# Patient Record
Sex: Female | Born: 1940
Health system: Southern US, Community
[De-identification: ages and names within clinical notes are randomized; demographics above are authoritative.]

## PROBLEM LIST (undated history)

## (undated) DIAGNOSIS — F32A Depression, unspecified: Secondary | ICD-10-CM

## (undated) DIAGNOSIS — I10 Essential (primary) hypertension: Secondary | ICD-10-CM

## (undated) DIAGNOSIS — F419 Anxiety disorder, unspecified: Secondary | ICD-10-CM

## (undated) DIAGNOSIS — K219 Gastro-esophageal reflux disease without esophagitis: Secondary | ICD-10-CM

## (undated) DIAGNOSIS — S2239XA Fracture of one rib, unspecified side, initial encounter for closed fracture: Secondary | ICD-10-CM

## (undated) DIAGNOSIS — F329 Major depressive disorder, single episode, unspecified: Secondary | ICD-10-CM

## (undated) DIAGNOSIS — H3530 Unspecified macular degeneration: Secondary | ICD-10-CM

## (undated) DIAGNOSIS — C801 Malignant (primary) neoplasm, unspecified: Secondary | ICD-10-CM

## (undated) DIAGNOSIS — T8859XA Other complications of anesthesia, initial encounter: Secondary | ICD-10-CM

## (undated) DIAGNOSIS — T4145XA Adverse effect of unspecified anesthetic, initial encounter: Secondary | ICD-10-CM

## (undated) DIAGNOSIS — M810 Age-related osteoporosis without current pathological fracture: Secondary | ICD-10-CM

## (undated) DIAGNOSIS — M1990 Unspecified osteoarthritis, unspecified site: Secondary | ICD-10-CM

## (undated) HISTORY — PX: TONSILLECTOMY: SUR1361

## (undated) HISTORY — PX: CHOLECYSTECTOMY: SHX55

## (undated) HISTORY — PX: WRIST RECONSTRUCTION: SHX2675

## (undated) HISTORY — PX: ARM SKIN LESION BIOPSY / EXCISION: SUR471

## (undated) HISTORY — PX: TUBAL LIGATION: SHX77

---
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
## 2001-06-29 ENCOUNTER — Other Ambulatory Visit: Admission: RE | Admit: 2001-06-29 | Discharge: 2001-06-29 | Payer: Self-pay | Admitting: Internal Medicine

## 2004-04-20 ENCOUNTER — Ambulatory Visit: Payer: Self-pay | Admitting: Internal Medicine

## 2005-04-25 ENCOUNTER — Ambulatory Visit: Payer: Self-pay | Admitting: Internal Medicine

## 2005-08-24 ENCOUNTER — Ambulatory Visit: Payer: Self-pay | Admitting: Internal Medicine

## 2005-11-08 ENCOUNTER — Emergency Department: Payer: Self-pay | Admitting: Emergency Medicine

## 2005-12-14 ENCOUNTER — Encounter: Payer: Self-pay | Admitting: Orthopedic Surgery

## 2006-01-08 ENCOUNTER — Encounter: Payer: Self-pay | Admitting: Orthopedic Surgery

## 2006-08-09 ENCOUNTER — Ambulatory Visit: Payer: Self-pay | Admitting: Internal Medicine

## 2007-12-11 ENCOUNTER — Ambulatory Visit: Payer: Self-pay | Admitting: Obstetrics and Gynecology

## 2008-03-08 ENCOUNTER — Ambulatory Visit: Payer: Self-pay | Admitting: Internal Medicine

## 2008-09-29 ENCOUNTER — Ambulatory Visit: Payer: Self-pay | Admitting: Internal Medicine

## 2009-04-30 ENCOUNTER — Other Ambulatory Visit: Payer: Self-pay | Admitting: Family

## 2009-05-06 ENCOUNTER — Ambulatory Visit: Payer: Self-pay | Admitting: Internal Medicine

## 2009-05-21 ENCOUNTER — Ambulatory Visit: Payer: Self-pay | Admitting: Internal Medicine

## 2009-05-26 ENCOUNTER — Ambulatory Visit: Payer: Self-pay | Admitting: Gastroenterology

## 2009-07-07 ENCOUNTER — Ambulatory Visit: Payer: Self-pay | Admitting: Ophthalmology

## 2009-07-13 ENCOUNTER — Ambulatory Visit: Payer: Self-pay | Admitting: Ophthalmology

## 2009-08-25 ENCOUNTER — Ambulatory Visit: Payer: Self-pay | Admitting: Ophthalmology

## 2010-06-22 ENCOUNTER — Ambulatory Visit: Payer: Self-pay | Admitting: Internal Medicine

## 2011-07-14 ENCOUNTER — Ambulatory Visit: Payer: Self-pay | Admitting: Internal Medicine

## 2012-05-30 ENCOUNTER — Ambulatory Visit: Payer: Self-pay | Admitting: Gastroenterology

## 2012-05-31 LAB — PATHOLOGY REPORT

## 2012-07-18 ENCOUNTER — Ambulatory Visit: Payer: Self-pay | Admitting: Internal Medicine

## 2013-07-19 ENCOUNTER — Ambulatory Visit: Payer: Self-pay | Admitting: Internal Medicine

## 2014-07-16 DIAGNOSIS — M461 Sacroiliitis, not elsewhere classified: Secondary | ICD-10-CM | POA: Diagnosis not present

## 2014-07-16 DIAGNOSIS — M9903 Segmental and somatic dysfunction of lumbar region: Secondary | ICD-10-CM | POA: Diagnosis not present

## 2014-07-16 DIAGNOSIS — M797 Fibromyalgia: Secondary | ICD-10-CM | POA: Diagnosis not present

## 2014-07-16 DIAGNOSIS — M9905 Segmental and somatic dysfunction of pelvic region: Secondary | ICD-10-CM | POA: Diagnosis not present

## 2014-07-21 ENCOUNTER — Ambulatory Visit: Payer: Self-pay | Admitting: Internal Medicine

## 2014-07-21 DIAGNOSIS — Z1231 Encounter for screening mammogram for malignant neoplasm of breast: Secondary | ICD-10-CM | POA: Diagnosis not present

## 2014-07-28 DIAGNOSIS — M810 Age-related osteoporosis without current pathological fracture: Secondary | ICD-10-CM | POA: Diagnosis not present

## 2014-08-06 DIAGNOSIS — M797 Fibromyalgia: Secondary | ICD-10-CM | POA: Diagnosis not present

## 2014-08-06 DIAGNOSIS — M9905 Segmental and somatic dysfunction of pelvic region: Secondary | ICD-10-CM | POA: Diagnosis not present

## 2014-08-06 DIAGNOSIS — M461 Sacroiliitis, not elsewhere classified: Secondary | ICD-10-CM | POA: Diagnosis not present

## 2014-08-06 DIAGNOSIS — M9903 Segmental and somatic dysfunction of lumbar region: Secondary | ICD-10-CM | POA: Diagnosis not present

## 2014-09-10 DIAGNOSIS — M9905 Segmental and somatic dysfunction of pelvic region: Secondary | ICD-10-CM | POA: Diagnosis not present

## 2014-09-10 DIAGNOSIS — M797 Fibromyalgia: Secondary | ICD-10-CM | POA: Diagnosis not present

## 2014-09-10 DIAGNOSIS — M461 Sacroiliitis, not elsewhere classified: Secondary | ICD-10-CM | POA: Diagnosis not present

## 2014-09-10 DIAGNOSIS — M9903 Segmental and somatic dysfunction of lumbar region: Secondary | ICD-10-CM | POA: Diagnosis not present

## 2014-09-24 DIAGNOSIS — M461 Sacroiliitis, not elsewhere classified: Secondary | ICD-10-CM | POA: Diagnosis not present

## 2014-09-24 DIAGNOSIS — M9905 Segmental and somatic dysfunction of pelvic region: Secondary | ICD-10-CM | POA: Diagnosis not present

## 2014-09-24 DIAGNOSIS — M797 Fibromyalgia: Secondary | ICD-10-CM | POA: Diagnosis not present

## 2014-09-24 DIAGNOSIS — M9903 Segmental and somatic dysfunction of lumbar region: Secondary | ICD-10-CM | POA: Diagnosis not present

## 2014-09-26 DIAGNOSIS — M1990 Unspecified osteoarthritis, unspecified site: Secondary | ICD-10-CM | POA: Diagnosis not present

## 2014-09-26 DIAGNOSIS — R55 Syncope and collapse: Secondary | ICD-10-CM | POA: Diagnosis not present

## 2014-09-26 DIAGNOSIS — I10 Essential (primary) hypertension: Secondary | ICD-10-CM | POA: Diagnosis not present

## 2014-09-29 DIAGNOSIS — M9905 Segmental and somatic dysfunction of pelvic region: Secondary | ICD-10-CM | POA: Diagnosis not present

## 2014-09-29 DIAGNOSIS — M9903 Segmental and somatic dysfunction of lumbar region: Secondary | ICD-10-CM | POA: Diagnosis not present

## 2014-09-29 DIAGNOSIS — M461 Sacroiliitis, not elsewhere classified: Secondary | ICD-10-CM | POA: Diagnosis not present

## 2014-09-29 DIAGNOSIS — M797 Fibromyalgia: Secondary | ICD-10-CM | POA: Diagnosis not present

## 2014-10-01 ENCOUNTER — Ambulatory Visit: Payer: Self-pay | Admitting: Internal Medicine

## 2014-10-01 DIAGNOSIS — R269 Unspecified abnormalities of gait and mobility: Secondary | ICD-10-CM | POA: Diagnosis not present

## 2014-10-01 DIAGNOSIS — R55 Syncope and collapse: Secondary | ICD-10-CM | POA: Diagnosis not present

## 2014-10-01 DIAGNOSIS — R410 Disorientation, unspecified: Secondary | ICD-10-CM | POA: Diagnosis not present

## 2014-10-01 DIAGNOSIS — R42 Dizziness and giddiness: Secondary | ICD-10-CM | POA: Diagnosis not present

## 2014-10-01 DIAGNOSIS — I739 Peripheral vascular disease, unspecified: Secondary | ICD-10-CM | POA: Diagnosis not present

## 2014-10-10 DIAGNOSIS — M797 Fibromyalgia: Secondary | ICD-10-CM | POA: Diagnosis not present

## 2014-10-10 DIAGNOSIS — M461 Sacroiliitis, not elsewhere classified: Secondary | ICD-10-CM | POA: Diagnosis not present

## 2014-10-10 DIAGNOSIS — M9903 Segmental and somatic dysfunction of lumbar region: Secondary | ICD-10-CM | POA: Diagnosis not present

## 2014-10-10 DIAGNOSIS — M9905 Segmental and somatic dysfunction of pelvic region: Secondary | ICD-10-CM | POA: Diagnosis not present

## 2014-10-18 DIAGNOSIS — M1711 Unilateral primary osteoarthritis, right knee: Secondary | ICD-10-CM | POA: Diagnosis not present

## 2014-10-18 DIAGNOSIS — S52531A Colles' fracture of right radius, initial encounter for closed fracture: Secondary | ICD-10-CM | POA: Diagnosis not present

## 2014-10-22 DIAGNOSIS — S52531A Colles' fracture of right radius, initial encounter for closed fracture: Secondary | ICD-10-CM | POA: Diagnosis not present

## 2014-10-24 DIAGNOSIS — M797 Fibromyalgia: Secondary | ICD-10-CM | POA: Diagnosis not present

## 2014-10-24 DIAGNOSIS — M1990 Unspecified osteoarthritis, unspecified site: Secondary | ICD-10-CM | POA: Diagnosis not present

## 2014-10-24 DIAGNOSIS — K219 Gastro-esophageal reflux disease without esophagitis: Secondary | ICD-10-CM | POA: Diagnosis not present

## 2014-10-24 DIAGNOSIS — M461 Sacroiliitis, not elsewhere classified: Secondary | ICD-10-CM | POA: Diagnosis not present

## 2014-10-24 DIAGNOSIS — I10 Essential (primary) hypertension: Secondary | ICD-10-CM | POA: Diagnosis not present

## 2014-10-24 DIAGNOSIS — M810 Age-related osteoporosis without current pathological fracture: Secondary | ICD-10-CM | POA: Diagnosis not present

## 2014-10-24 DIAGNOSIS — M9905 Segmental and somatic dysfunction of pelvic region: Secondary | ICD-10-CM | POA: Diagnosis not present

## 2014-10-24 DIAGNOSIS — M9903 Segmental and somatic dysfunction of lumbar region: Secondary | ICD-10-CM | POA: Diagnosis not present

## 2014-10-29 DIAGNOSIS — S52531D Colles' fracture of right radius, subsequent encounter for closed fracture with routine healing: Secondary | ICD-10-CM | POA: Diagnosis not present

## 2014-10-29 DIAGNOSIS — S52531A Colles' fracture of right radius, initial encounter for closed fracture: Secondary | ICD-10-CM | POA: Diagnosis not present

## 2014-10-31 DIAGNOSIS — H26491 Other secondary cataract, right eye: Secondary | ICD-10-CM | POA: Diagnosis not present

## 2014-10-31 DIAGNOSIS — H3531 Nonexudative age-related macular degeneration: Secondary | ICD-10-CM | POA: Diagnosis not present

## 2014-11-01 DIAGNOSIS — S62101D Fracture of unspecified carpal bone, right wrist, subsequent encounter for fracture with routine healing: Secondary | ICD-10-CM | POA: Diagnosis not present

## 2014-11-01 DIAGNOSIS — F17200 Nicotine dependence, unspecified, uncomplicated: Secondary | ICD-10-CM | POA: Diagnosis not present

## 2014-11-01 DIAGNOSIS — M1990 Unspecified osteoarthritis, unspecified site: Secondary | ICD-10-CM | POA: Diagnosis not present

## 2014-11-01 DIAGNOSIS — Z9181 History of falling: Secondary | ICD-10-CM | POA: Diagnosis not present

## 2014-11-01 DIAGNOSIS — R269 Unspecified abnormalities of gait and mobility: Secondary | ICD-10-CM | POA: Diagnosis not present

## 2014-11-01 DIAGNOSIS — F329 Major depressive disorder, single episode, unspecified: Secondary | ICD-10-CM | POA: Diagnosis not present

## 2014-11-01 DIAGNOSIS — I10 Essential (primary) hypertension: Secondary | ICD-10-CM | POA: Diagnosis not present

## 2014-11-01 DIAGNOSIS — M810 Age-related osteoporosis without current pathological fracture: Secondary | ICD-10-CM | POA: Diagnosis not present

## 2014-11-03 DIAGNOSIS — I10 Essential (primary) hypertension: Secondary | ICD-10-CM | POA: Diagnosis not present

## 2014-11-03 DIAGNOSIS — S52531A Colles' fracture of right radius, initial encounter for closed fracture: Secondary | ICD-10-CM | POA: Diagnosis not present

## 2014-11-03 DIAGNOSIS — Z01818 Encounter for other preprocedural examination: Secondary | ICD-10-CM | POA: Diagnosis not present

## 2014-11-03 DIAGNOSIS — S42201A Unspecified fracture of upper end of right humerus, initial encounter for closed fracture: Secondary | ICD-10-CM | POA: Diagnosis not present

## 2014-11-03 DIAGNOSIS — S52551A Other extraarticular fracture of lower end of right radius, initial encounter for closed fracture: Secondary | ICD-10-CM | POA: Diagnosis not present

## 2014-11-03 DIAGNOSIS — S52531D Colles' fracture of right radius, subsequent encounter for closed fracture with routine healing: Secondary | ICD-10-CM | POA: Diagnosis not present

## 2014-11-06 DIAGNOSIS — F17200 Nicotine dependence, unspecified, uncomplicated: Secondary | ICD-10-CM | POA: Diagnosis not present

## 2014-11-06 DIAGNOSIS — Z9181 History of falling: Secondary | ICD-10-CM | POA: Diagnosis not present

## 2014-11-06 DIAGNOSIS — M1990 Unspecified osteoarthritis, unspecified site: Secondary | ICD-10-CM | POA: Diagnosis not present

## 2014-11-06 DIAGNOSIS — F329 Major depressive disorder, single episode, unspecified: Secondary | ICD-10-CM | POA: Diagnosis not present

## 2014-11-06 DIAGNOSIS — S62101D Fracture of unspecified carpal bone, right wrist, subsequent encounter for fracture with routine healing: Secondary | ICD-10-CM | POA: Diagnosis not present

## 2014-11-06 DIAGNOSIS — R269 Unspecified abnormalities of gait and mobility: Secondary | ICD-10-CM | POA: Diagnosis not present

## 2014-11-06 DIAGNOSIS — I10 Essential (primary) hypertension: Secondary | ICD-10-CM | POA: Diagnosis not present

## 2014-11-06 DIAGNOSIS — M810 Age-related osteoporosis without current pathological fracture: Secondary | ICD-10-CM | POA: Diagnosis not present

## 2014-11-07 DIAGNOSIS — S62101D Fracture of unspecified carpal bone, right wrist, subsequent encounter for fracture with routine healing: Secondary | ICD-10-CM | POA: Diagnosis not present

## 2014-11-07 DIAGNOSIS — M1990 Unspecified osteoarthritis, unspecified site: Secondary | ICD-10-CM | POA: Diagnosis not present

## 2014-11-07 DIAGNOSIS — Z9181 History of falling: Secondary | ICD-10-CM | POA: Diagnosis not present

## 2014-11-07 DIAGNOSIS — M810 Age-related osteoporosis without current pathological fracture: Secondary | ICD-10-CM | POA: Diagnosis not present

## 2014-11-07 DIAGNOSIS — F329 Major depressive disorder, single episode, unspecified: Secondary | ICD-10-CM | POA: Diagnosis not present

## 2014-11-07 DIAGNOSIS — R269 Unspecified abnormalities of gait and mobility: Secondary | ICD-10-CM | POA: Diagnosis not present

## 2014-11-07 DIAGNOSIS — F17200 Nicotine dependence, unspecified, uncomplicated: Secondary | ICD-10-CM | POA: Diagnosis not present

## 2014-11-07 DIAGNOSIS — I10 Essential (primary) hypertension: Secondary | ICD-10-CM | POA: Diagnosis not present

## 2014-11-11 DIAGNOSIS — S52531D Colles' fracture of right radius, subsequent encounter for closed fracture with routine healing: Secondary | ICD-10-CM | POA: Diagnosis not present

## 2014-11-13 DIAGNOSIS — M1990 Unspecified osteoarthritis, unspecified site: Secondary | ICD-10-CM | POA: Diagnosis not present

## 2014-11-13 DIAGNOSIS — I10 Essential (primary) hypertension: Secondary | ICD-10-CM | POA: Diagnosis not present

## 2014-11-13 DIAGNOSIS — F17200 Nicotine dependence, unspecified, uncomplicated: Secondary | ICD-10-CM | POA: Diagnosis not present

## 2014-11-13 DIAGNOSIS — Z9181 History of falling: Secondary | ICD-10-CM | POA: Diagnosis not present

## 2014-11-13 DIAGNOSIS — F329 Major depressive disorder, single episode, unspecified: Secondary | ICD-10-CM | POA: Diagnosis not present

## 2014-11-13 DIAGNOSIS — M810 Age-related osteoporosis without current pathological fracture: Secondary | ICD-10-CM | POA: Diagnosis not present

## 2014-11-13 DIAGNOSIS — R269 Unspecified abnormalities of gait and mobility: Secondary | ICD-10-CM | POA: Diagnosis not present

## 2014-11-13 DIAGNOSIS — S62101D Fracture of unspecified carpal bone, right wrist, subsequent encounter for fracture with routine healing: Secondary | ICD-10-CM | POA: Diagnosis not present

## 2014-11-17 DIAGNOSIS — S52531D Colles' fracture of right radius, subsequent encounter for closed fracture with routine healing: Secondary | ICD-10-CM | POA: Diagnosis not present

## 2014-11-17 DIAGNOSIS — Z4789 Encounter for other orthopedic aftercare: Secondary | ICD-10-CM | POA: Diagnosis not present

## 2014-11-19 DIAGNOSIS — I10 Essential (primary) hypertension: Secondary | ICD-10-CM | POA: Diagnosis not present

## 2014-11-19 DIAGNOSIS — M810 Age-related osteoporosis without current pathological fracture: Secondary | ICD-10-CM | POA: Diagnosis not present

## 2014-11-19 DIAGNOSIS — S62101D Fracture of unspecified carpal bone, right wrist, subsequent encounter for fracture with routine healing: Secondary | ICD-10-CM | POA: Diagnosis not present

## 2014-11-19 DIAGNOSIS — M1990 Unspecified osteoarthritis, unspecified site: Secondary | ICD-10-CM | POA: Diagnosis not present

## 2014-11-19 DIAGNOSIS — Z9181 History of falling: Secondary | ICD-10-CM | POA: Diagnosis not present

## 2014-11-19 DIAGNOSIS — F329 Major depressive disorder, single episode, unspecified: Secondary | ICD-10-CM | POA: Diagnosis not present

## 2014-11-19 DIAGNOSIS — R269 Unspecified abnormalities of gait and mobility: Secondary | ICD-10-CM | POA: Diagnosis not present

## 2014-11-19 DIAGNOSIS — F17200 Nicotine dependence, unspecified, uncomplicated: Secondary | ICD-10-CM | POA: Diagnosis not present

## 2014-11-20 DIAGNOSIS — M810 Age-related osteoporosis without current pathological fracture: Secondary | ICD-10-CM | POA: Diagnosis not present

## 2014-11-20 DIAGNOSIS — Z9181 History of falling: Secondary | ICD-10-CM | POA: Diagnosis not present

## 2014-11-20 DIAGNOSIS — R269 Unspecified abnormalities of gait and mobility: Secondary | ICD-10-CM | POA: Diagnosis not present

## 2014-11-20 DIAGNOSIS — M1990 Unspecified osteoarthritis, unspecified site: Secondary | ICD-10-CM | POA: Diagnosis not present

## 2014-11-20 DIAGNOSIS — F17200 Nicotine dependence, unspecified, uncomplicated: Secondary | ICD-10-CM | POA: Diagnosis not present

## 2014-11-20 DIAGNOSIS — I10 Essential (primary) hypertension: Secondary | ICD-10-CM | POA: Diagnosis not present

## 2014-11-20 DIAGNOSIS — F329 Major depressive disorder, single episode, unspecified: Secondary | ICD-10-CM | POA: Diagnosis not present

## 2014-11-20 DIAGNOSIS — S62101D Fracture of unspecified carpal bone, right wrist, subsequent encounter for fracture with routine healing: Secondary | ICD-10-CM | POA: Diagnosis not present

## 2014-11-21 DIAGNOSIS — M1990 Unspecified osteoarthritis, unspecified site: Secondary | ICD-10-CM | POA: Diagnosis not present

## 2014-11-21 DIAGNOSIS — M810 Age-related osteoporosis without current pathological fracture: Secondary | ICD-10-CM | POA: Diagnosis not present

## 2014-11-21 DIAGNOSIS — F17200 Nicotine dependence, unspecified, uncomplicated: Secondary | ICD-10-CM | POA: Diagnosis not present

## 2014-11-21 DIAGNOSIS — R269 Unspecified abnormalities of gait and mobility: Secondary | ICD-10-CM | POA: Diagnosis not present

## 2014-11-21 DIAGNOSIS — F329 Major depressive disorder, single episode, unspecified: Secondary | ICD-10-CM | POA: Diagnosis not present

## 2014-11-21 DIAGNOSIS — I10 Essential (primary) hypertension: Secondary | ICD-10-CM | POA: Diagnosis not present

## 2014-11-21 DIAGNOSIS — Z9181 History of falling: Secondary | ICD-10-CM | POA: Diagnosis not present

## 2014-11-21 DIAGNOSIS — S62101D Fracture of unspecified carpal bone, right wrist, subsequent encounter for fracture with routine healing: Secondary | ICD-10-CM | POA: Diagnosis not present

## 2014-11-25 DIAGNOSIS — R269 Unspecified abnormalities of gait and mobility: Secondary | ICD-10-CM | POA: Diagnosis not present

## 2014-11-25 DIAGNOSIS — M810 Age-related osteoporosis without current pathological fracture: Secondary | ICD-10-CM | POA: Diagnosis not present

## 2014-11-25 DIAGNOSIS — F17200 Nicotine dependence, unspecified, uncomplicated: Secondary | ICD-10-CM | POA: Diagnosis not present

## 2014-11-25 DIAGNOSIS — F329 Major depressive disorder, single episode, unspecified: Secondary | ICD-10-CM | POA: Diagnosis not present

## 2014-11-25 DIAGNOSIS — Z9181 History of falling: Secondary | ICD-10-CM | POA: Diagnosis not present

## 2014-11-25 DIAGNOSIS — M1990 Unspecified osteoarthritis, unspecified site: Secondary | ICD-10-CM | POA: Diagnosis not present

## 2014-11-25 DIAGNOSIS — I10 Essential (primary) hypertension: Secondary | ICD-10-CM | POA: Diagnosis not present

## 2014-11-25 DIAGNOSIS — S62101D Fracture of unspecified carpal bone, right wrist, subsequent encounter for fracture with routine healing: Secondary | ICD-10-CM | POA: Diagnosis not present

## 2014-11-26 DIAGNOSIS — F17200 Nicotine dependence, unspecified, uncomplicated: Secondary | ICD-10-CM | POA: Diagnosis not present

## 2014-11-26 DIAGNOSIS — R269 Unspecified abnormalities of gait and mobility: Secondary | ICD-10-CM | POA: Diagnosis not present

## 2014-11-26 DIAGNOSIS — I10 Essential (primary) hypertension: Secondary | ICD-10-CM | POA: Diagnosis not present

## 2014-11-26 DIAGNOSIS — M1990 Unspecified osteoarthritis, unspecified site: Secondary | ICD-10-CM | POA: Diagnosis not present

## 2014-11-26 DIAGNOSIS — F329 Major depressive disorder, single episode, unspecified: Secondary | ICD-10-CM | POA: Diagnosis not present

## 2014-11-26 DIAGNOSIS — S62101D Fracture of unspecified carpal bone, right wrist, subsequent encounter for fracture with routine healing: Secondary | ICD-10-CM | POA: Diagnosis not present

## 2014-11-26 DIAGNOSIS — Z9181 History of falling: Secondary | ICD-10-CM | POA: Diagnosis not present

## 2014-11-26 DIAGNOSIS — M810 Age-related osteoporosis without current pathological fracture: Secondary | ICD-10-CM | POA: Diagnosis not present

## 2014-11-27 DIAGNOSIS — R269 Unspecified abnormalities of gait and mobility: Secondary | ICD-10-CM | POA: Diagnosis not present

## 2014-11-27 DIAGNOSIS — M810 Age-related osteoporosis without current pathological fracture: Secondary | ICD-10-CM | POA: Diagnosis not present

## 2014-11-27 DIAGNOSIS — F329 Major depressive disorder, single episode, unspecified: Secondary | ICD-10-CM | POA: Diagnosis not present

## 2014-11-27 DIAGNOSIS — I10 Essential (primary) hypertension: Secondary | ICD-10-CM | POA: Diagnosis not present

## 2014-11-27 DIAGNOSIS — S62101D Fracture of unspecified carpal bone, right wrist, subsequent encounter for fracture with routine healing: Secondary | ICD-10-CM | POA: Diagnosis not present

## 2014-11-27 DIAGNOSIS — M1990 Unspecified osteoarthritis, unspecified site: Secondary | ICD-10-CM | POA: Diagnosis not present

## 2014-11-27 DIAGNOSIS — Z9181 History of falling: Secondary | ICD-10-CM | POA: Diagnosis not present

## 2014-11-27 DIAGNOSIS — F17200 Nicotine dependence, unspecified, uncomplicated: Secondary | ICD-10-CM | POA: Diagnosis not present

## 2014-11-28 DIAGNOSIS — M1990 Unspecified osteoarthritis, unspecified site: Secondary | ICD-10-CM | POA: Diagnosis not present

## 2014-11-28 DIAGNOSIS — R269 Unspecified abnormalities of gait and mobility: Secondary | ICD-10-CM | POA: Diagnosis not present

## 2014-11-28 DIAGNOSIS — F17200 Nicotine dependence, unspecified, uncomplicated: Secondary | ICD-10-CM | POA: Diagnosis not present

## 2014-11-28 DIAGNOSIS — S62101D Fracture of unspecified carpal bone, right wrist, subsequent encounter for fracture with routine healing: Secondary | ICD-10-CM | POA: Diagnosis not present

## 2014-11-28 DIAGNOSIS — F329 Major depressive disorder, single episode, unspecified: Secondary | ICD-10-CM | POA: Diagnosis not present

## 2014-11-28 DIAGNOSIS — M810 Age-related osteoporosis without current pathological fracture: Secondary | ICD-10-CM | POA: Diagnosis not present

## 2014-11-28 DIAGNOSIS — I10 Essential (primary) hypertension: Secondary | ICD-10-CM | POA: Diagnosis not present

## 2014-11-28 DIAGNOSIS — Z9181 History of falling: Secondary | ICD-10-CM | POA: Diagnosis not present

## 2014-12-01 DIAGNOSIS — S62101D Fracture of unspecified carpal bone, right wrist, subsequent encounter for fracture with routine healing: Secondary | ICD-10-CM | POA: Diagnosis not present

## 2014-12-01 DIAGNOSIS — M810 Age-related osteoporosis without current pathological fracture: Secondary | ICD-10-CM | POA: Diagnosis not present

## 2014-12-01 DIAGNOSIS — F17200 Nicotine dependence, unspecified, uncomplicated: Secondary | ICD-10-CM | POA: Diagnosis not present

## 2014-12-01 DIAGNOSIS — I10 Essential (primary) hypertension: Secondary | ICD-10-CM | POA: Diagnosis not present

## 2014-12-01 DIAGNOSIS — R269 Unspecified abnormalities of gait and mobility: Secondary | ICD-10-CM | POA: Diagnosis not present

## 2014-12-01 DIAGNOSIS — Z9181 History of falling: Secondary | ICD-10-CM | POA: Diagnosis not present

## 2014-12-01 DIAGNOSIS — F329 Major depressive disorder, single episode, unspecified: Secondary | ICD-10-CM | POA: Diagnosis not present

## 2014-12-01 DIAGNOSIS — M1990 Unspecified osteoarthritis, unspecified site: Secondary | ICD-10-CM | POA: Diagnosis not present

## 2014-12-04 DIAGNOSIS — F17200 Nicotine dependence, unspecified, uncomplicated: Secondary | ICD-10-CM | POA: Diagnosis not present

## 2014-12-04 DIAGNOSIS — R269 Unspecified abnormalities of gait and mobility: Secondary | ICD-10-CM | POA: Diagnosis not present

## 2014-12-04 DIAGNOSIS — Z9181 History of falling: Secondary | ICD-10-CM | POA: Diagnosis not present

## 2014-12-04 DIAGNOSIS — S62101D Fracture of unspecified carpal bone, right wrist, subsequent encounter for fracture with routine healing: Secondary | ICD-10-CM | POA: Diagnosis not present

## 2014-12-04 DIAGNOSIS — M1990 Unspecified osteoarthritis, unspecified site: Secondary | ICD-10-CM | POA: Diagnosis not present

## 2014-12-04 DIAGNOSIS — F329 Major depressive disorder, single episode, unspecified: Secondary | ICD-10-CM | POA: Diagnosis not present

## 2014-12-04 DIAGNOSIS — I10 Essential (primary) hypertension: Secondary | ICD-10-CM | POA: Diagnosis not present

## 2014-12-04 DIAGNOSIS — K219 Gastro-esophageal reflux disease without esophagitis: Secondary | ICD-10-CM | POA: Diagnosis not present

## 2014-12-04 DIAGNOSIS — M810 Age-related osteoporosis without current pathological fracture: Secondary | ICD-10-CM | POA: Diagnosis not present

## 2014-12-09 DIAGNOSIS — F329 Major depressive disorder, single episode, unspecified: Secondary | ICD-10-CM | POA: Diagnosis not present

## 2014-12-09 DIAGNOSIS — M810 Age-related osteoporosis without current pathological fracture: Secondary | ICD-10-CM | POA: Diagnosis not present

## 2014-12-09 DIAGNOSIS — Z9181 History of falling: Secondary | ICD-10-CM | POA: Diagnosis not present

## 2014-12-09 DIAGNOSIS — S62101D Fracture of unspecified carpal bone, right wrist, subsequent encounter for fracture with routine healing: Secondary | ICD-10-CM | POA: Diagnosis not present

## 2014-12-09 DIAGNOSIS — M1990 Unspecified osteoarthritis, unspecified site: Secondary | ICD-10-CM | POA: Diagnosis not present

## 2014-12-09 DIAGNOSIS — I10 Essential (primary) hypertension: Secondary | ICD-10-CM | POA: Diagnosis not present

## 2014-12-09 DIAGNOSIS — F17200 Nicotine dependence, unspecified, uncomplicated: Secondary | ICD-10-CM | POA: Diagnosis not present

## 2014-12-09 DIAGNOSIS — R269 Unspecified abnormalities of gait and mobility: Secondary | ICD-10-CM | POA: Diagnosis not present

## 2014-12-11 DIAGNOSIS — Z9181 History of falling: Secondary | ICD-10-CM | POA: Diagnosis not present

## 2014-12-11 DIAGNOSIS — I10 Essential (primary) hypertension: Secondary | ICD-10-CM | POA: Diagnosis not present

## 2014-12-11 DIAGNOSIS — R269 Unspecified abnormalities of gait and mobility: Secondary | ICD-10-CM | POA: Diagnosis not present

## 2014-12-11 DIAGNOSIS — S62101D Fracture of unspecified carpal bone, right wrist, subsequent encounter for fracture with routine healing: Secondary | ICD-10-CM | POA: Diagnosis not present

## 2014-12-11 DIAGNOSIS — F329 Major depressive disorder, single episode, unspecified: Secondary | ICD-10-CM | POA: Diagnosis not present

## 2014-12-11 DIAGNOSIS — F17200 Nicotine dependence, unspecified, uncomplicated: Secondary | ICD-10-CM | POA: Diagnosis not present

## 2014-12-11 DIAGNOSIS — M810 Age-related osteoporosis without current pathological fracture: Secondary | ICD-10-CM | POA: Diagnosis not present

## 2014-12-11 DIAGNOSIS — K219 Gastro-esophageal reflux disease without esophagitis: Secondary | ICD-10-CM | POA: Diagnosis not present

## 2014-12-11 DIAGNOSIS — M1990 Unspecified osteoarthritis, unspecified site: Secondary | ICD-10-CM | POA: Diagnosis not present

## 2014-12-19 DIAGNOSIS — S52531D Colles' fracture of right radius, subsequent encounter for closed fracture with routine healing: Secondary | ICD-10-CM | POA: Diagnosis not present

## 2014-12-26 DIAGNOSIS — I10 Essential (primary) hypertension: Secondary | ICD-10-CM | POA: Diagnosis not present

## 2014-12-26 DIAGNOSIS — S62101D Fracture of unspecified carpal bone, right wrist, subsequent encounter for fracture with routine healing: Secondary | ICD-10-CM | POA: Diagnosis not present

## 2014-12-26 DIAGNOSIS — F17200 Nicotine dependence, unspecified, uncomplicated: Secondary | ICD-10-CM | POA: Diagnosis not present

## 2014-12-26 DIAGNOSIS — R269 Unspecified abnormalities of gait and mobility: Secondary | ICD-10-CM | POA: Diagnosis not present

## 2014-12-26 DIAGNOSIS — M810 Age-related osteoporosis without current pathological fracture: Secondary | ICD-10-CM | POA: Diagnosis not present

## 2014-12-26 DIAGNOSIS — Z9181 History of falling: Secondary | ICD-10-CM | POA: Diagnosis not present

## 2014-12-26 DIAGNOSIS — F329 Major depressive disorder, single episode, unspecified: Secondary | ICD-10-CM | POA: Diagnosis not present

## 2014-12-26 DIAGNOSIS — M1990 Unspecified osteoarthritis, unspecified site: Secondary | ICD-10-CM | POA: Diagnosis not present

## 2015-05-25 DIAGNOSIS — Z85828 Personal history of other malignant neoplasm of skin: Secondary | ICD-10-CM | POA: Diagnosis not present

## 2015-05-25 DIAGNOSIS — L578 Other skin changes due to chronic exposure to nonionizing radiation: Secondary | ICD-10-CM | POA: Diagnosis not present

## 2015-05-25 DIAGNOSIS — L820 Inflamed seborrheic keratosis: Secondary | ICD-10-CM | POA: Diagnosis not present

## 2015-06-17 DIAGNOSIS — M1990 Unspecified osteoarthritis, unspecified site: Secondary | ICD-10-CM | POA: Diagnosis not present

## 2015-06-17 DIAGNOSIS — I10 Essential (primary) hypertension: Secondary | ICD-10-CM | POA: Diagnosis not present

## 2015-06-17 DIAGNOSIS — M810 Age-related osteoporosis without current pathological fracture: Secondary | ICD-10-CM | POA: Diagnosis not present

## 2015-06-17 DIAGNOSIS — K219 Gastro-esophageal reflux disease without esophagitis: Secondary | ICD-10-CM | POA: Diagnosis not present

## 2015-06-24 ENCOUNTER — Other Ambulatory Visit: Payer: Self-pay | Admitting: Internal Medicine

## 2015-06-24 DIAGNOSIS — Z1239 Encounter for other screening for malignant neoplasm of breast: Secondary | ICD-10-CM | POA: Diagnosis not present

## 2015-06-24 DIAGNOSIS — K219 Gastro-esophageal reflux disease without esophagitis: Secondary | ICD-10-CM | POA: Diagnosis not present

## 2015-06-24 DIAGNOSIS — H3530 Unspecified macular degeneration: Secondary | ICD-10-CM | POA: Diagnosis not present

## 2015-06-24 DIAGNOSIS — I10 Essential (primary) hypertension: Secondary | ICD-10-CM | POA: Diagnosis not present

## 2015-06-24 DIAGNOSIS — F3342 Major depressive disorder, recurrent, in full remission: Secondary | ICD-10-CM | POA: Diagnosis not present

## 2015-06-24 DIAGNOSIS — M1990 Unspecified osteoarthritis, unspecified site: Secondary | ICD-10-CM | POA: Diagnosis not present

## 2015-06-24 DIAGNOSIS — Z1231 Encounter for screening mammogram for malignant neoplasm of breast: Secondary | ICD-10-CM

## 2015-06-24 DIAGNOSIS — M810 Age-related osteoporosis without current pathological fracture: Secondary | ICD-10-CM | POA: Diagnosis not present

## 2015-07-10 DIAGNOSIS — R079 Chest pain, unspecified: Secondary | ICD-10-CM | POA: Diagnosis not present

## 2015-07-10 DIAGNOSIS — Z86010 Personal history of colonic polyps: Secondary | ICD-10-CM | POA: Diagnosis not present

## 2015-07-10 DIAGNOSIS — Z1211 Encounter for screening for malignant neoplasm of colon: Secondary | ICD-10-CM | POA: Diagnosis not present

## 2015-07-15 DIAGNOSIS — L812 Freckles: Secondary | ICD-10-CM | POA: Diagnosis not present

## 2015-07-15 DIAGNOSIS — L578 Other skin changes due to chronic exposure to nonionizing radiation: Secondary | ICD-10-CM | POA: Diagnosis not present

## 2015-07-15 DIAGNOSIS — L821 Other seborrheic keratosis: Secondary | ICD-10-CM | POA: Diagnosis not present

## 2015-07-15 DIAGNOSIS — Z85828 Personal history of other malignant neoplasm of skin: Secondary | ICD-10-CM | POA: Diagnosis not present

## 2015-07-15 DIAGNOSIS — L820 Inflamed seborrheic keratosis: Secondary | ICD-10-CM | POA: Diagnosis not present

## 2015-07-15 DIAGNOSIS — Z1283 Encounter for screening for malignant neoplasm of skin: Secondary | ICD-10-CM | POA: Diagnosis not present

## 2015-07-15 DIAGNOSIS — D1800 Hemangioma unspecified site: Secondary | ICD-10-CM | POA: Diagnosis not present

## 2015-07-15 DIAGNOSIS — D229 Melanocytic nevi, unspecified: Secondary | ICD-10-CM | POA: Diagnosis not present

## 2015-07-23 ENCOUNTER — Ambulatory Visit
Admission: RE | Admit: 2015-07-23 | Discharge: 2015-07-23 | Disposition: A | Discharge status: Home / Self Care | Payer: Commercial Managed Care - HMO | Source: Ambulatory Visit | Attending: Internal Medicine | Admitting: Internal Medicine

## 2015-07-23 ENCOUNTER — Other Ambulatory Visit: Payer: Self-pay | Admitting: Internal Medicine

## 2015-07-23 DIAGNOSIS — Z1231 Encounter for screening mammogram for malignant neoplasm of breast: Secondary | ICD-10-CM

## 2015-07-24 DIAGNOSIS — K219 Gastro-esophageal reflux disease without esophagitis: Secondary | ICD-10-CM | POA: Diagnosis not present

## 2015-07-24 DIAGNOSIS — R0609 Other forms of dyspnea: Secondary | ICD-10-CM | POA: Diagnosis not present

## 2015-07-24 DIAGNOSIS — I10 Essential (primary) hypertension: Secondary | ICD-10-CM | POA: Diagnosis not present

## 2015-07-24 DIAGNOSIS — Z01818 Encounter for other preprocedural examination: Secondary | ICD-10-CM | POA: Diagnosis not present

## 2015-07-31 ENCOUNTER — Encounter: Payer: Self-pay | Admitting: *Deleted

## 2015-08-03 ENCOUNTER — Encounter: Payer: Self-pay | Admitting: *Deleted

## 2015-08-03 ENCOUNTER — Ambulatory Visit: Payer: Commercial Managed Care - HMO | Admitting: Anesthesiology

## 2015-08-03 ENCOUNTER — Encounter
Admission: RE | Disposition: A | Discharge status: Home Health | Payer: Self-pay | Source: Ambulatory Visit | Attending: Gastroenterology

## 2015-08-03 ENCOUNTER — Ambulatory Visit
Admission: RE | Admit: 2015-08-03 | Discharge: 2015-08-03 | Disposition: A | Discharge status: Home Health | Payer: Commercial Managed Care - HMO | Source: Ambulatory Visit | Attending: Gastroenterology | Admitting: Gastroenterology

## 2015-08-03 DIAGNOSIS — Z8349 Family history of other endocrine, nutritional and metabolic diseases: Secondary | ICD-10-CM | POA: Insufficient documentation

## 2015-08-03 DIAGNOSIS — Z791 Long term (current) use of non-steroidal anti-inflammatories (NSAID): Secondary | ICD-10-CM | POA: Insufficient documentation

## 2015-08-03 DIAGNOSIS — Z9049 Acquired absence of other specified parts of digestive tract: Secondary | ICD-10-CM | POA: Diagnosis not present

## 2015-08-03 DIAGNOSIS — Z8052 Family history of malignant neoplasm of bladder: Secondary | ICD-10-CM | POA: Diagnosis not present

## 2015-08-03 DIAGNOSIS — Z801 Family history of malignant neoplasm of trachea, bronchus and lung: Secondary | ICD-10-CM | POA: Diagnosis not present

## 2015-08-03 DIAGNOSIS — Z880 Allergy status to penicillin: Secondary | ICD-10-CM | POA: Diagnosis not present

## 2015-08-03 DIAGNOSIS — K219 Gastro-esophageal reflux disease without esophagitis: Secondary | ICD-10-CM | POA: Diagnosis not present

## 2015-08-03 DIAGNOSIS — Z886 Allergy status to analgesic agent status: Secondary | ICD-10-CM | POA: Diagnosis not present

## 2015-08-03 DIAGNOSIS — Z79899 Other long term (current) drug therapy: Secondary | ICD-10-CM | POA: Diagnosis not present

## 2015-08-03 DIAGNOSIS — Z86010 Personal history of colonic polyps: Secondary | ICD-10-CM | POA: Diagnosis not present

## 2015-08-03 DIAGNOSIS — Z1211 Encounter for screening for malignant neoplasm of colon: Secondary | ICD-10-CM | POA: Diagnosis not present

## 2015-08-03 DIAGNOSIS — Z888 Allergy status to other drugs, medicaments and biological substances status: Secondary | ICD-10-CM | POA: Diagnosis not present

## 2015-08-03 DIAGNOSIS — F17200 Nicotine dependence, unspecified, uncomplicated: Secondary | ICD-10-CM | POA: Diagnosis not present

## 2015-08-03 DIAGNOSIS — F329 Major depressive disorder, single episode, unspecified: Secondary | ICD-10-CM | POA: Diagnosis not present

## 2015-08-03 DIAGNOSIS — Z9889 Other specified postprocedural states: Secondary | ICD-10-CM | POA: Diagnosis not present

## 2015-08-03 DIAGNOSIS — Z91018 Allergy to other foods: Secondary | ICD-10-CM | POA: Diagnosis not present

## 2015-08-03 DIAGNOSIS — K529 Noninfective gastroenteritis and colitis, unspecified: Secondary | ICD-10-CM | POA: Insufficient documentation

## 2015-08-03 DIAGNOSIS — Z85828 Personal history of other malignant neoplasm of skin: Secondary | ICD-10-CM | POA: Diagnosis not present

## 2015-08-03 DIAGNOSIS — I10 Essential (primary) hypertension: Secondary | ICD-10-CM | POA: Insufficient documentation

## 2015-08-03 DIAGNOSIS — M810 Age-related osteoporosis without current pathological fracture: Secondary | ICD-10-CM | POA: Diagnosis not present

## 2015-08-03 DIAGNOSIS — Z800 Family history of malignant neoplasm of digestive organs: Secondary | ICD-10-CM | POA: Diagnosis not present

## 2015-08-03 DIAGNOSIS — K635 Polyp of colon: Secondary | ICD-10-CM | POA: Insufficient documentation

## 2015-08-03 DIAGNOSIS — F419 Anxiety disorder, unspecified: Secondary | ICD-10-CM | POA: Insufficient documentation

## 2015-08-03 DIAGNOSIS — Z885 Allergy status to narcotic agent status: Secondary | ICD-10-CM | POA: Diagnosis not present

## 2015-08-03 DIAGNOSIS — D124 Benign neoplasm of descending colon: Secondary | ICD-10-CM | POA: Diagnosis not present

## 2015-08-03 HISTORY — DX: Gastro-esophageal reflux disease without esophagitis: K21.9

## 2015-08-03 HISTORY — DX: Depression, unspecified: F32.A

## 2015-08-03 HISTORY — DX: Malignant (primary) neoplasm, unspecified: C80.1

## 2015-08-03 HISTORY — PX: COLONOSCOPY WITH PROPOFOL: SHX5780

## 2015-08-03 HISTORY — DX: Essential (primary) hypertension: I10

## 2015-08-03 HISTORY — DX: Fracture of one rib, unspecified side, initial encounter for closed fracture: S22.39XA

## 2015-08-03 HISTORY — DX: Unspecified macular degeneration: H35.30

## 2015-08-03 HISTORY — DX: Unspecified osteoarthritis, unspecified site: M19.90

## 2015-08-03 HISTORY — DX: Anxiety disorder, unspecified: F41.9

## 2015-08-03 HISTORY — DX: Major depressive disorder, single episode, unspecified: F32.9

## 2015-08-03 SURGERY — COLONOSCOPY WITH PROPOFOL
Anesthesia: General

## 2015-08-03 MED ORDER — LIDOCAINE HCL (CARDIAC) 20 MG/ML IV SOLN
INTRAVENOUS | Status: DC | PRN
  Administered 2015-08-03: 20 mg via INTRAVENOUS

## 2015-08-03 MED ORDER — SODIUM CHLORIDE 0.9 % IV SOLN
INTRAVENOUS | Status: DC
  Administered 2015-08-03: 13:00:00 via INTRAVENOUS

## 2015-08-03 MED ORDER — PROPOFOL 10 MG/ML IV BOLUS
INTRAVENOUS | Status: DC | PRN
  Administered 2015-08-03: 40 mg via INTRAVENOUS
  Administered 2015-08-03: 70 mg via INTRAVENOUS
  Administered 2015-08-03: 40 mg via INTRAVENOUS

## 2015-08-03 MED ORDER — SODIUM CHLORIDE 0.9 % IV SOLN: INTRAVENOUS | Status: DC

## 2015-08-03 MED ORDER — PROPOFOL 500 MG/50ML IV EMUL
INTRAVENOUS | Status: DC | PRN
  Administered 2015-08-03: 100 ug/kg/min via INTRAVENOUS

## 2015-08-03 NOTE — Anesthesia Preprocedure Evaluation (Signed)
Anesthesia Evaluation  Patient identified by MRN, date of birth, ID band Patient awake    Reviewed: Allergy & Precautions, H&P , NPO status , Patient's Chart, lab work & pertinent test results, reviewed documented beta blocker date and time   History of Anesthesia Complications Negative for: history of anesthetic complications  Airway Mallampati: II  TM Distance: >3 FB Neck ROM: full    Dental no notable dental hx. (+) Partial Lower, Edentulous Upper, Upper Dentures   Pulmonary neg shortness of breath, neg sleep apnea, neg COPD, neg recent URI, Current Smoker,    Pulmonary exam normal breath sounds clear to auscultation       Cardiovascular Exercise Tolerance: Good hypertension, On Medications (-) angina(-) CAD, (-) Past MI, (-) Cardiac Stents and (-) CABG Normal cardiovascular exam(-) dysrhythmias + Valvular Problems/Murmurs  Rhythm:regular Rate:Normal     Neuro/Psych PSYCHIATRIC DISORDERS (Depression) negative neurological ROS     GI/Hepatic Neg liver ROS, GERD  Medicated and Controlled,  Endo/Other  negative endocrine ROS  Renal/GU negative Renal ROS  negative genitourinary   Musculoskeletal   Abdominal   Peds  Hematology negative hematology ROS (+)   Anesthesia Other Findings Past Medical History:   Anxiety                                                      Arthritis                                                    Depression                                                   GERD (gastroesophageal reflux disease)                       Hypertension                                                 Cancer (HCC)                                                 Macular degeneration of both eyes                            Fracture of one rib                                          Reproductive/Obstetrics negative OB ROS                             Anesthesia Physical Anesthesia  Plan  ASA: II  Anesthesia Plan: General   Post-op Pain Management:    Induction:   Airway Management Planned:   Additional Equipment:   Intra-op Plan:   Post-operative Plan:   Informed Consent: I have reviewed the patients History and Physical, chart, labs and discussed the procedure including the risks, benefits and alternatives for the proposed anesthesia with the patient or authorized representative who has indicated his/her understanding and acceptance.   Dental Advisory Given  Plan Discussed with: Anesthesiologist, CRNA and Surgeon  Anesthesia Plan Comments:         Anesthesia Quick Evaluation

## 2015-08-03 NOTE — Transfer of Care (Signed)
Immediate Anesthesia Transfer of Care Note  Patient: April Rogers  Procedure(s) Performed: Procedure(s): COLONOSCOPY WITH PROPOFOL (N/A)  Patient Location: Endoscopy Unit  Anesthesia Type:General  Level of Consciousness: sedated  Airway & Oxygen Therapy: Patient Spontanous Breathing and Patient connected to nasal cannula oxygen  Post-op Assessment: Report given to RN and Post -op Vital signs reviewed and stable  Post vital signs: Reviewed and stable  Last Vitals:  Filed Vitals:   08/03/15 1250  BP: 136/64  Pulse: 108  Temp: 36.6 C  Resp: 16    Complications: No apparent anesthesia complications

## 2015-08-03 NOTE — H&P (Signed)
  Date of Initial H&P: 07/10/2015  History reviewed, patient examined, no change in status, stable for surgery.

## 2015-08-03 NOTE — Op Note (Signed)
[facility] Gastroenterology Patient Name: April Rogers Procedure Date: 08/03/2015 1:21 PM MRN: [id number] Account #: [id number] Date of Birth: [birth year] Admit Type: Outpatient Age: 75 Room: [facility] ENDO ROOM 4 Gender: Female Note Status: Finalized Procedure:         Colonoscopy Indications:       Personal history of colonic polyps Providers:         Lupita Dawn. Candace Cruise, MD Referring MD:      Ramonita Lab, MD (Referring MD) Medicines:         Monitored Anesthesia Care Complications:     No immediate complications. Procedure:         Pre-Anesthesia Assessment:                    - Prior to the procedure, a History and Physical was                     performed, and patient medications, allergies and                     sensitivities were reviewed. The patient's tolerance of                     previous anesthesia was reviewed.                    - The risks and benefits of the procedure and the sedation                     options and risks were discussed with the patient. All                     questions were answered and informed consent was obtained.                    - After reviewing the risks and benefits, the patient was                     deemed in satisfactory condition to undergo the procedure.                    After obtaining informed consent, the colonoscope was                     passed under direct vision. Throughout the procedure, the                     patient's blood pressure, pulse, and oxygen saturations                     were monitored continuously. The Colonoscope was                     introduced through the anus and advanced to the the cecum,                     identified by appendiceal orifice and ileocecal valve. The                     patient tolerated the procedure well. The quality of the                     bowel preparation was good. The colonoscopy was performed  with moderate difficulty due to restricted mobility of  the                     colon. Successful completion of the procedure was aided by                     using manual pressure. Findings:      A diminutive polyp was found in the descending colon. The polyp was       sessile. The polyp was removed with a cold snare. Resection was       complete, but the polyp tissue was not retrieved.      The exam was otherwise without abnormality. Impression:        - One diminutive polyp in the descending colon. Complete                     resection. Polyp tissue not retrieved.                    - The examination was otherwise normal. Recommendation:    - Discharge patient to home.                    - Repeat colonoscopy in 5 years for surveillance.                    - The findings and recommendations were discussed with the                     patient. Procedure Code(s): --- Professional ---                    [phone], Colonoscopy, flexible; with removal of tumor(s),                     polyp(s), or other lesion(s) by snare technique Diagnosis Code(s): --- Professional ---                    D12.4, Benign neoplasm of descending colon                    Z86.010, Personal history of colonic polyps CPT copyright 2014 American Medical Association. All rights reserved. The codes documented in this report are preliminary and upon coder review may  be revised to meet current compliance requirements. Hulen Luster, MD 08/03/2015 1:42:37 PM This report has been signed electronically. Number of Addenda: 0 Note Initiated On: 08/03/2015 1:21 PM Scope Withdrawal Time: 0 hours 3 minutes 6 seconds  Total Procedure Duration: 0 hours 13 minutes 15 seconds       [facility]

## 2015-08-04 NOTE — Anesthesia Postprocedure Evaluation (Addendum)
Anesthesia Post Note  Patient: April Rogers  Procedure(s) Performed: Procedure(s) (LRB): COLONOSCOPY WITH PROPOFOL (N/A)  Patient location during evaluation: Endoscopy Anesthesia Type: General Level of consciousness: awake and alert Pain management: pain level controlled Vital Signs Assessment: post-procedure vital signs reviewed and stable Respiratory status: spontaneous breathing, nonlabored ventilation, respiratory function stable and patient connected to nasal cannula oxygen Cardiovascular status: blood pressure returned to baseline and stable Postop Assessment: no signs of nausea or vomiting Anesthetic complications: no    Last Vitals:  Filed Vitals:   08/03/15 1415 08/03/15 1426  BP: 137/63 124/69  Pulse: 88 90  Temp:    Resp: 17 23    Last Pain:  Filed Vitals:   08/04/15 0756  PainSc: 0-No pain                 Martha Clan

## 2015-08-05 ENCOUNTER — Encounter: Payer: Self-pay | Admitting: Gastroenterology

## 2015-08-05 LAB — SURGICAL PATHOLOGY

## 2015-11-25 ENCOUNTER — Emergency Department: Payer: Commercial Managed Care - HMO

## 2015-11-25 ENCOUNTER — Encounter: Payer: Self-pay | Admitting: Emergency Medicine

## 2015-11-25 ENCOUNTER — Inpatient Hospital Stay
Admission: EM | Admit: 2015-11-25 | Discharge: 2015-11-28 | DRG: 481 | Disposition: A | Discharge status: Skilled Nursing Facility | Payer: Commercial Managed Care - HMO | Attending: Internal Medicine | Admitting: Internal Medicine

## 2015-11-25 DIAGNOSIS — Z880 Allergy status to penicillin: Secondary | ICD-10-CM

## 2015-11-25 DIAGNOSIS — Z79899 Other long term (current) drug therapy: Secondary | ICD-10-CM

## 2015-11-25 DIAGNOSIS — F329 Major depressive disorder, single episode, unspecified: Secondary | ICD-10-CM | POA: Diagnosis not present

## 2015-11-25 DIAGNOSIS — S72141A Displaced intertrochanteric fracture of right femur, initial encounter for closed fracture: Principal | ICD-10-CM | POA: Diagnosis present

## 2015-11-25 DIAGNOSIS — W010XXA Fall on same level from slipping, tripping and stumbling without subsequent striking against object, initial encounter: Secondary | ICD-10-CM | POA: Diagnosis present

## 2015-11-25 DIAGNOSIS — Z7409 Other reduced mobility: Secondary | ICD-10-CM | POA: Diagnosis not present

## 2015-11-25 DIAGNOSIS — Z888 Allergy status to other drugs, medicaments and biological substances status: Secondary | ICD-10-CM

## 2015-11-25 DIAGNOSIS — K210 Gastro-esophageal reflux disease with esophagitis: Secondary | ICD-10-CM | POA: Diagnosis present

## 2015-11-25 DIAGNOSIS — S72141D Displaced intertrochanteric fracture of right femur, subsequent encounter for closed fracture with routine healing: Secondary | ICD-10-CM | POA: Diagnosis not present

## 2015-11-25 DIAGNOSIS — S7291XA Unspecified fracture of right femur, initial encounter for closed fracture: Secondary | ICD-10-CM | POA: Diagnosis present

## 2015-11-25 DIAGNOSIS — K219 Gastro-esophageal reflux disease without esophagitis: Secondary | ICD-10-CM | POA: Diagnosis not present

## 2015-11-25 DIAGNOSIS — E876 Hypokalemia: Secondary | ICD-10-CM | POA: Diagnosis not present

## 2015-11-25 DIAGNOSIS — M25551 Pain in right hip: Secondary | ICD-10-CM | POA: Diagnosis not present

## 2015-11-25 DIAGNOSIS — I10 Essential (primary) hypertension: Secondary | ICD-10-CM | POA: Diagnosis not present

## 2015-11-25 DIAGNOSIS — Z8249 Family history of ischemic heart disease and other diseases of the circulatory system: Secondary | ICD-10-CM

## 2015-11-25 DIAGNOSIS — Z885 Allergy status to narcotic agent status: Secondary | ICD-10-CM

## 2015-11-25 DIAGNOSIS — Z7982 Long term (current) use of aspirin: Secondary | ICD-10-CM

## 2015-11-25 DIAGNOSIS — H3530 Unspecified macular degeneration: Secondary | ICD-10-CM | POA: Diagnosis present

## 2015-11-25 DIAGNOSIS — R262 Difficulty in walking, not elsewhere classified: Secondary | ICD-10-CM | POA: Diagnosis not present

## 2015-11-25 DIAGNOSIS — S72101A Unspecified trochanteric fracture of right femur, initial encounter for closed fracture: Secondary | ICD-10-CM | POA: Diagnosis not present

## 2015-11-25 DIAGNOSIS — M1990 Unspecified osteoarthritis, unspecified site: Secondary | ICD-10-CM | POA: Diagnosis present

## 2015-11-25 DIAGNOSIS — S299XXA Unspecified injury of thorax, initial encounter: Secondary | ICD-10-CM | POA: Diagnosis not present

## 2015-11-25 DIAGNOSIS — S72009M Fracture of unspecified part of neck of unspecified femur, subsequent encounter for open fracture type I or II with nonunion: Secondary | ICD-10-CM

## 2015-11-25 DIAGNOSIS — F17210 Nicotine dependence, cigarettes, uncomplicated: Secondary | ICD-10-CM | POA: Diagnosis present

## 2015-11-25 DIAGNOSIS — S72001A Fracture of unspecified part of neck of right femur, initial encounter for closed fracture: Secondary | ICD-10-CM

## 2015-11-25 DIAGNOSIS — D62 Acute posthemorrhagic anemia: Secondary | ICD-10-CM | POA: Diagnosis not present

## 2015-11-25 DIAGNOSIS — F418 Other specified anxiety disorders: Secondary | ICD-10-CM | POA: Diagnosis not present

## 2015-11-25 DIAGNOSIS — Z9181 History of falling: Secondary | ICD-10-CM | POA: Diagnosis not present

## 2015-11-25 DIAGNOSIS — F419 Anxiety disorder, unspecified: Secondary | ICD-10-CM | POA: Diagnosis present

## 2015-11-25 DIAGNOSIS — M6281 Muscle weakness (generalized): Secondary | ICD-10-CM | POA: Diagnosis not present

## 2015-11-25 DIAGNOSIS — S8991XA Unspecified injury of right lower leg, initial encounter: Secondary | ICD-10-CM | POA: Diagnosis not present

## 2015-11-25 LAB — URINALYSIS COMPLETE WITH MICROSCOPIC (ARMC ONLY)
BILIRUBIN URINE: NEGATIVE
Bacteria, UA: NONE SEEN
GLUCOSE, UA: NEGATIVE mg/dL
Hgb urine dipstick: NEGATIVE
Leukocytes, UA: NEGATIVE
Nitrite: NEGATIVE
PROTEIN: NEGATIVE mg/dL
Specific Gravity, Urine: 1.024 (ref 1.005–1.030)
pH: 5 (ref 5.0–8.0)

## 2015-11-25 LAB — TYPE AND SCREEN
ABO/RH(D): A POS
Antibody Screen: NEGATIVE

## 2015-11-25 LAB — CBC WITH DIFFERENTIAL/PLATELET
Basophils Absolute: 0 10*3/uL (ref 0–0.1)
Basophils Relative: 0 %
EOS PCT: 1 %
Eosinophils Absolute: 0 10*3/uL (ref 0–0.7)
HCT: 37.7 % (ref 35.0–47.0)
Hemoglobin: 13 g/dL (ref 12.0–16.0)
LYMPHS ABS: 2.3 10*3/uL (ref 1.0–3.6)
LYMPHS PCT: 33 %
MCH: 33.8 pg (ref 26.0–34.0)
MCHC: 34.5 g/dL (ref 32.0–36.0)
MCV: 97.9 fL (ref 80.0–100.0)
MONO ABS: 0.5 10*3/uL (ref 0.2–0.9)
MONOS PCT: 8 %
Neutro Abs: 4.1 10*3/uL (ref 1.4–6.5)
Neutrophils Relative %: 58 %
PLATELETS: 193 10*3/uL (ref 150–440)
RBC: 3.85 MIL/uL (ref 3.80–5.20)
RDW: 12.6 % (ref 11.5–14.5)
WBC: 7 10*3/uL (ref 3.6–11.0)

## 2015-11-25 LAB — COMPREHENSIVE METABOLIC PANEL
ALBUMIN: 3.9 g/dL (ref 3.5–5.0)
ALT: 31 U/L (ref 14–54)
AST: 30 U/L (ref 15–41)
Alkaline Phosphatase: 74 U/L (ref 38–126)
Anion gap: 10 (ref 5–15)
BILIRUBIN TOTAL: 0.7 mg/dL (ref 0.3–1.2)
BUN: 20 mg/dL (ref 6–20)
CHLORIDE: 101 mmol/L (ref 101–111)
CO2: 27 mmol/L (ref 22–32)
Calcium: 9.1 mg/dL (ref 8.9–10.3)
Creatinine, Ser: 0.66 mg/dL (ref 0.44–1.00)
GFR calc Af Amer: >60 mL/min (ref >60)
GFR calc non Af Amer: >60 mL/min (ref >60)
GLUCOSE: 105 mg/dL — AB (ref 65–99)
POTASSIUM: 3.3 mmol/L — AB (ref 3.5–5.1)
Sodium: 138 mmol/L (ref 135–145)
TOTAL PROTEIN: 6.5 g/dL (ref 6.5–8.1)

## 2015-11-25 LAB — ALBUMIN: Albumin: 3.8 g/dL (ref 3.5–5.0)

## 2015-11-25 LAB — APTT: aPTT: 31 seconds (ref 24–36)

## 2015-11-25 LAB — SURGICAL PCR SCREEN
MRSA, PCR: NEGATIVE
STAPHYLOCOCCUS AUREUS: NEGATIVE

## 2015-11-25 LAB — PROTIME-INR
INR: 1.06
PROTHROMBIN TIME: 14 s (ref 11.4–15.0)

## 2015-11-25 LAB — ABO/RH: ABO/RH(D): A POS

## 2015-11-25 LAB — CALCIUM: Calcium: 9.1 mg/dL (ref 8.9–10.3)

## 2015-11-25 MED ORDER — METHOCARBAMOL 500 MG PO TABS
500.0000 mg | ORAL_TABLET | Freq: Four times a day (QID) | ORAL | Status: DC | PRN
  Administered 2015-11-25: 500 mg via ORAL
  Filled 2015-11-25: qty 1

## 2015-11-25 MED ORDER — ONDANSETRON HCL 4 MG PO TABS: 4.0000 mg | ORAL_TABLET | Freq: Four times a day (QID) | ORAL | Status: DC | PRN

## 2015-11-25 MED ORDER — VITAMIN D 1000 UNITS PO TABS: 1000.0000 [IU] | ORAL_TABLET | Freq: Every day | ORAL | Status: DC

## 2015-11-25 MED ORDER — VENLAFAXINE HCL ER 75 MG PO CP24
150.0000 mg | ORAL_CAPSULE | Freq: Every day | ORAL | Status: DC
  Administered 2015-11-25: 150 mg via ORAL
  Filled 2015-11-25 (×2): qty 2

## 2015-11-25 MED ORDER — OXYCODONE HCL 5 MG PO TABS
5.0000 mg | ORAL_TABLET | ORAL | Status: DC | PRN
  Administered 2015-11-25: 5 mg via ORAL

## 2015-11-25 MED ORDER — ZOLPIDEM TARTRATE 5 MG PO TABS
5.0000 mg | ORAL_TABLET | Freq: Every evening | ORAL | Status: DC | PRN
  Administered 2015-11-25: 5 mg via ORAL
  Filled 2015-11-25: qty 1

## 2015-11-25 MED ORDER — MORPHINE SULFATE (PF) 2 MG/ML IV SOLN
INTRAVENOUS | Status: AC
  Filled 2015-11-25: qty 1

## 2015-11-25 MED ORDER — AMLODIPINE BESYLATE 5 MG PO TABS
5.0000 mg | ORAL_TABLET | Freq: Every day | ORAL | Status: DC
  Filled 2015-11-25: qty 1

## 2015-11-25 MED ORDER — PANTOPRAZOLE SODIUM 40 MG PO TBEC
40.0000 mg | DELAYED_RELEASE_TABLET | Freq: Every day | ORAL | Status: DC
  Filled 2015-11-25: qty 1

## 2015-11-25 MED ORDER — MORPHINE SULFATE (PF) 2 MG/ML IV SOLN
INTRAVENOUS | Status: AC
  Administered 2015-11-25: 2 mg via INTRAVENOUS
  Filled 2015-11-25: qty 1

## 2015-11-25 MED ORDER — MORPHINE SULFATE (PF) 2 MG/ML IV SOLN
2.0000 mg | Freq: Once | INTRAVENOUS | Status: AC
  Administered 2015-11-25: 2 mg via INTRAVENOUS

## 2015-11-25 MED ORDER — VENLAFAXINE HCL ER 150 MG PO CP24
150.0000 mg | ORAL_CAPSULE | Freq: Every day | ORAL | Status: DC
  Filled 2015-11-25: qty 1

## 2015-11-25 MED ORDER — VANCOMYCIN HCL 10 G IV SOLR
1500.0000 mg | Freq: Once | INTRAVENOUS | Status: AC
  Administered 2015-11-26: 1500 mg via INTRAVENOUS
  Filled 2015-11-25: qty 1500

## 2015-11-25 MED ORDER — NICOTINE 21 MG/24HR TD PT24
21.0000 mg | MEDICATED_PATCH | Freq: Every day | TRANSDERMAL | Status: DC
  Administered 2015-11-25: 21 mg via TRANSDERMAL
  Filled 2015-11-25: qty 1

## 2015-11-25 MED ORDER — SENNA 8.6 MG PO TABS
1.0000 | ORAL_TABLET | Freq: Two times a day (BID) | ORAL | Status: DC
  Administered 2015-11-25: 8.6 mg via ORAL
  Filled 2015-11-25: qty 1

## 2015-11-25 MED ORDER — SODIUM CHLORIDE 0.9 % IV SOLN: INTRAVENOUS | Status: DC

## 2015-11-25 MED ORDER — MORPHINE SULFATE (PF) 2 MG/ML IV SOLN
2.0000 mg | Freq: Once | INTRAVENOUS | Status: AC
  Administered 2015-11-25: 2 mg via INTRAVENOUS
  Filled 2015-11-25: qty 1

## 2015-11-25 MED ORDER — DOCUSATE SODIUM 100 MG PO CAPS
100.0000 mg | ORAL_CAPSULE | Freq: Two times a day (BID) | ORAL | Status: DC
  Administered 2015-11-25: 100 mg via ORAL
  Filled 2015-11-25 (×2): qty 1

## 2015-11-25 MED ORDER — ONDANSETRON HCL 4 MG/2ML IJ SOLN: 4.0000 mg | Freq: Four times a day (QID) | INTRAMUSCULAR | Status: DC | PRN

## 2015-11-25 MED ORDER — POTASSIUM CHLORIDE CRYS ER 20 MEQ PO TBCR
40.0000 meq | EXTENDED_RELEASE_TABLET | Freq: Two times a day (BID) | ORAL | Status: DC
  Administered 2015-11-25: 40 meq via ORAL
  Filled 2015-11-25: qty 2

## 2015-11-25 MED ORDER — CENTRUM SILVER PO TABS: ORAL_TABLET | Freq: Every day | ORAL | Status: DC

## 2015-11-25 MED ORDER — METHOCARBAMOL 1000 MG/10ML IJ SOLN
500.0000 mg | Freq: Four times a day (QID) | INTRAVENOUS | Status: DC | PRN
  Filled 2015-11-25: qty 5

## 2015-11-25 MED ORDER — HYDROMORPHONE HCL 1 MG/ML IJ SOLN
INTRAMUSCULAR | Status: AC
  Administered 2015-11-25: 1 mg via INTRAVENOUS
  Filled 2015-11-25: qty 1

## 2015-11-25 MED ORDER — ACETAMINOPHEN 650 MG RE SUPP
650.0000 mg | Freq: Four times a day (QID) | RECTAL | Status: DC | PRN
  Filled 2015-11-25: qty 2

## 2015-11-25 MED ORDER — HYDROMORPHONE HCL 1 MG/ML IJ SOLN
1.0000 mg | INTRAMUSCULAR | Status: DC | PRN
  Administered 2015-11-25 – 2015-11-26 (×4): 1 mg via INTRAVENOUS
  Filled 2015-11-25 (×4): qty 1

## 2015-11-25 MED ORDER — OXYCODONE HCL 5 MG PO TABS
ORAL_TABLET | ORAL | Status: AC
  Filled 2015-11-25: qty 1

## 2015-11-25 MED ORDER — ADULT MULTIVITAMIN W/MINERALS CH: 1.0000 | ORAL_TABLET | Freq: Every day | ORAL | Status: DC

## 2015-11-25 MED ORDER — OXYCODONE HCL 5 MG PO TABS
5.0000 mg | ORAL_TABLET | ORAL | Status: DC | PRN
  Administered 2015-11-25: 5 mg via ORAL
  Administered 2015-11-26: 10 mg via ORAL
  Filled 2015-11-25: qty 2
  Filled 2015-11-25: qty 1

## 2015-11-25 MED ORDER — OMEGA-3-ACID ETHYL ESTERS 1 G PO CAPS: 1.0000 g | ORAL_CAPSULE | Freq: Every day | ORAL | Status: DC

## 2015-11-25 MED ORDER — MORPHINE SULFATE (PF) 2 MG/ML IV SOLN: 2.0000 mg | INTRAVENOUS | Status: DC | PRN

## 2015-11-25 MED ORDER — POLYETHYLENE GLYCOL 3350 17 G PO PACK: 17.0000 g | PACK | Freq: Every day | ORAL | Status: DC | PRN

## 2015-11-25 MED ORDER — HYDROMORPHONE HCL 1 MG/ML IJ SOLN
1.0000 mg | INTRAMUSCULAR | Status: DC | PRN
  Administered 2015-11-25 (×2): 1 mg via INTRAVENOUS
  Filled 2015-11-25: qty 1

## 2015-11-25 MED ORDER — RENA-VITE PO TABS: 1.0000 | ORAL_TABLET | Freq: Every day | ORAL | Status: DC

## 2015-11-25 MED ORDER — ACETAMINOPHEN 325 MG PO TABS: 650.0000 mg | ORAL_TABLET | Freq: Four times a day (QID) | ORAL | Status: DC | PRN

## 2015-11-25 NOTE — H&P (Signed)
[place] [facility] [place] NAME: April Rogers    MR#:  [id number]  DATE OF BIRTH:  [birth year]   DATE OF ADMISSION:  11/25/2015  PRIMARY CARE PHYSICIAN: Adin Hector, MD   REQUESTING/REFERRING PHYSICIAN: Edd Fabian  CHIEF COMPLAINT:   Chief Complaint  Patient presents with  . Fall    HISTORY OF PRESENT ILLNESS:  April Rogers  is a 75 y.o. female with a known history of Essential hypertension, GERD without esophagitis who is presenting after mechanical fall. Patient fell in a parking lot on cement, traumatic injury to left hip noticed immediate pain at that site. Nonradiating pain 10/10 "excruciating" worse with movement no relieving factors no further symptoms  PAST MEDICAL HISTORY:   Past Medical History  Diagnosis Date  . Anxiety   . Arthritis   . Depression   . GERD (gastroesophageal reflux disease)   . Hypertension   . Cancer ([place])   . Macular degeneration of both eyes   . Fracture of one rib     PAST SURGICAL HISTORY:   Past Surgical History  Procedure Laterality Date  . Cholecystectomy    . Tonsillectomy    . Tubal ligation    . Wrist reconstruction Right   . Arm skin lesion biopsy / excision Right   . Colonoscopy with propofol N/A 08/03/2015    Procedure: COLONOSCOPY WITH PROPOFOL;  Surgeon: Hulen Luster, MD;  Location: [facility] ENDOSCOPY;  Service: Gastroenterology;  Laterality: N/A;    SOCIAL HISTORY:   Social History  Substance Use Topics  . Smoking status: Current Every Day Smoker -- 1.00 packs/day for 50 years    Types: Cigarettes  . Smokeless tobacco: Never Used  . Alcohol Use: 8.4 oz/week    14 Shots of liquor per week    FAMILY HISTORY:   Family History  Problem Relation Age of Onset  . Breast cancer Neg Hx   . Hypertension Other     DRUG ALLERGIES:   Allergies  Allergen Reactions  . Actonel [Risedronate]   . Alendronate   . Codeine   . Meloxicam   . Penicillins   . Varenicline   . Wellbutrin  [Bupropion]   . Wine [Alcohol] Hives    REVIEW OF SYSTEMS:  REVIEW OF SYSTEMS:  CONSTITUTIONAL: Denies fevers, chills, fatigue, weakness.  EYES: Denies blurred vision, double vision, or eye pain.  EARS, NOSE, THROAT: Denies tinnitus, ear pain, hearing loss.  RESPIRATORY: denies cough, shortness of breath, wheezing  CARDIOVASCULAR: Denies chest pain, palpitations, edema.  GASTROINTESTINAL: Denies nausea, vomiting, diarrhea, abdominal pain.  GENITOURINARY: Denies dysuria, hematuria.  ENDOCRINE: Denies nocturia or thyroid problems. HEMATOLOGIC AND LYMPHATIC: Denies easy bruising or bleeding.  SKIN: Denies rash or lesions.  MUSCULOSKELETAL: Right hip pain as above otherwise Denies pain in neck, back, shoulder, knees, hips, or further arthritic symptoms.  NEUROLOGIC: Denies paralysis, paresthesias.  PSYCHIATRIC: Denies anxiety or depressive symptoms. Otherwise full review of systems performed by me is negative.   MEDICATIONS AT HOME:   Prior to Admission medications   Medication Sig Start Date End Date Taking? Authorizing Provider  amLODipine (NORVASC) 5 MG tablet Take 5 mg by mouth daily.    Historical Provider, MD  aspirin (ASPIRIN EC) 81 MG EC tablet Take 81 mg by mouth daily. Swallow whole.    Historical Provider, MD  b complex vitamins tablet Take 1 tablet by mouth daily.    Historical Provider, MD  Cholecalciferol 10000 units TABS Take 1,000 Units  by mouth daily.    Historical Provider, MD  esomeprazole (NEXIUM) 20 MG capsule Take 20 mg by mouth daily at 12 noon.    Historical Provider, MD  ibuprofen (ADVIL,MOTRIN) 200 MG tablet Take 200 mg by mouth daily.    Historical Provider, MD  Multiple Vitamins-Minerals (ANTIOXIDANT FORMULA SG) capsule Take 2 capsules by mouth daily.    Historical Provider, MD  Multiple Vitamins-Minerals (CENTRUM SILVER PO) Take 1 tablet by mouth daily.    Historical Provider, MD  naproxen sodium (ANAPROX) 220 MG tablet Take 220 mg by mouth 2 (two) times  daily with a meal. Reported on 08/03/2015    Historical Provider, MD  omega-3 acid ethyl esters (LOVAZA) 1 g capsule Take 1 g by mouth daily.    Historical Provider, MD  venlafaxine XR (EFFEXOR-XR) 150 MG 24 hr capsule Take 150 mg by mouth daily with breakfast.    Historical Provider, MD      VITAL SIGNS:  Blood pressure 110/46, pulse 84, temperature 99 F (37.2 C), temperature source Oral, resp. rate 16, height 5\' 4"  (1.626 m), weight 133 lb (60.328 kg), SpO2 97 %.  PHYSICAL EXAMINATION:  VITAL SIGNS: Filed Vitals:   11/25/15 1538 11/25/15 1624  BP: 124/50 110/46  Pulse: 86 84  Temp: 99 F (37.2 C)   Resp: 15 16   GENERAL:74 y.o.female currently in Moderate acute distress. Given pain HEAD: Normocephalic, atraumatic.  EYES: Pupils equal, round, reactive to light. Extraocular muscles intact. No scleral icterus.  MOUTH: Moist mucosal membrane. Dentition intact. No abscess noted.  EAR, NOSE, THROAT: Clear without exudates. No external lesions.  NECK: Supple. No thyromegaly. No nodules. No JVD.  PULMONARY: Clear to ascultation, without wheeze rails or rhonci. No use of accessory muscles, Good respiratory effort. good air entry bilaterally CHEST: Nontender to palpation.  CARDIOVASCULAR: S1 and S2. Regular rate and rhythm. No murmurs, rubs, or gallops. No edema. Pedal pulses 2+ bilaterally.  GASTROINTESTINAL: Soft, nontender, nondistended. No masses. Positive bowel sounds. No hepatosplenomegaly.  MUSCULOSKELETAL: No swelling, clubbing, or edema. Range of motion limited proximal right lower extremity given fracture distal range of motion intact  NEUROLOGIC: Cranial nerves II through XII are intact. No gross focal neurological deficits. Sensation intact. Reflexes intact.  SKIN: No ulceration, lesions, rashes, or cyanosis. Skin warm and dry. Turgor intact.  PSYCHIATRIC: Mood, affect within normal limits. The patient is awake, alert and oriented x 3. Insight, judgment intact.    LABORATORY  PANEL:   CBC  Recent Labs Lab 11/25/15 1530  WBC 7.0  HGB 13.0  HCT 37.7  PLT 193   ------------------------------------------------------------------------------------------------------------------  Chemistries   Recent Labs Lab 11/25/15 1530  NA 138  K 3.3*  CL 101  CO2 27  GLUCOSE 105*  BUN 20  CREATININE 0.66  CALCIUM 9.1  AST 30  ALT 31  ALKPHOS 74  BILITOT 0.7   ------------------------------------------------------------------------------------------------------------------  Cardiac Enzymes No results for input(s): TROPONINI in the last 168 hours. ------------------------------------------------------------------------------------------------------------------  RADIOLOGY:  Dg Chest 1 View  11/25/2015  CLINICAL DATA:  Right hip pain and deformity after falling today. Initial encounter. EXAM: CHEST 1 VIEW COMPARISON:  Radiographs 03/08/2008. FINDINGS: 1549 hours. The heart size is stable. There is progressive widening of superior mediastinum, probably due to vascular ectasia, lower lung volumes and AP technique. There is increased interstitial prominence throughout the lungs without overt pulmonary edema, confluent airspace opacity, pleural effusion or pneumothorax. No acute osseous findings are seen. IMPRESSION: No definite acute chest findings. There are lower lung  volumes with resulting increased prominence of the superior mediastinum and interstitial markings. Consider follow-up erect 2 view examination after treatment of patient's hip fracture. Electronically Signed   By: Richardean Sale M.D.   On: 11/25/2015 16:27   Dg Hip Unilat With Pelvis 2-3 Views Right  11/25/2015  CLINICAL DATA:  Fall with right hip pain.  Initial encounter. EXAM: DG HIP (WITH OR WITHOUT PELVIS) 2-3V RIGHT COMPARISON:  None. FINDINGS: Intertrochanteric right femur fracture with displacement and varus angulation. Rotated pelvis with no evidence of fracture or diastasis. Located hips.  Osteopenia. IMPRESSION: Displaced intertrochanteric right femur fracture. Electronically Signed   By: Monte Fantasia M.D.   On: 11/25/2015 16:21    EKG:   Orders placed or performed in visit on 07/07/09  . EKG 12-Lead    IMPRESSION AND PLAN:   75 year old Caucasian female history of essential hypertension. Without esophagitis presenting after mechanical fall  1. Intertrochanteric fracture right hip, closed, initial encounter: Preoperative evaluation: Patient is to be considered moderate risk for moderate risk surgery from cardiovascular standpoint no further testing/intervention required prior surgery as far as medications continue all antihypertensives in the perioperative period hold all NSAIDs. Nothing by mouth after midnight, add bowel regimen, SCDs 2. Essential hypertension Norvasc, 3. GERD without esophagitis PPI therapy  4. Hypokalemia: Replace goal 4-5 5. Venous thrombi and prophylactic: SCDs    All the records are reviewed and case discussed with ED provider. Management plans discussed with the patient, family and they are in agreement.  CODE STATUS: Full  TOTAL TIME TAKING CARE OF THIS PATIENT: 33 minutes.    Hower,  Karenann Cai.D on 11/25/2015 at 5:03 PM  Between 7am to 6pm - Pager - [phone]  After 6pm: House Pager: - [place] [facility]  Office  [phone]  CC: Primary care physician; Adin Hector, MD

## 2015-11-25 NOTE — ED Notes (Signed)
Rings and watch given to [facility]

## 2015-11-25 NOTE — ED Notes (Signed)
States she caught her foot and fell landed on right hip  Small abrasion noted to right lower leg  Positive deformity noted to leg

## 2015-11-25 NOTE — ED Provider Notes (Signed)
[facility] Emergency Department Provider Note  ____________________________________________  Time seen: Approximately 3:19 PM  I have reviewed the triage vital signs and the nursing notes.   HISTORY  Chief Complaint Fall    HPI April Rogers is a 75 y.o. female presents for evaluation right hip pain. Patient reports that she was stepping up a step when she tripped and fell landing on her right hip. She was brought in via EMS with IV started patient had received 100 mg of fentanyl prior to arrival complains of continued pain of 6-7/10 and describes as probably the worst pain she's ever had. Patient is nonradiating and localized into the right foot. All medications reviewed, allergies reviewed. Medical history reviewed.   Past Medical History  Diagnosis Date  . Anxiety   . Arthritis   . Depression   . GERD (gastroesophageal reflux disease)   . Hypertension   . Cancer ([place])   . Macular degeneration of both eyes   . Fracture of one rib     There are no active problems to display for this patient.   Past Surgical History  Procedure Laterality Date  . Cholecystectomy    . Tonsillectomy    . Tubal ligation    . Wrist reconstruction Right   . Arm skin lesion biopsy / excision Right   . Colonoscopy with propofol N/A 08/03/2015    Procedure: COLONOSCOPY WITH PROPOFOL;  Surgeon: Hulen Luster, MD;  Location: [facility] ENDOSCOPY;  Service: Gastroenterology;  Laterality: N/A;    Current Outpatient Rx  Name  Route  Sig  Dispense  Refill  . amLODipine (NORVASC) 5 MG tablet   Oral   Take 5 mg by mouth daily.         Marland Kitchen aspirin (ASPIRIN EC) 81 MG EC tablet   Oral   Take 81 mg by mouth daily. Swallow whole.         . b complex vitamins tablet   Oral   Take 1 tablet by mouth daily.         . Cholecalciferol 10000 units TABS   Oral   Take 1,000 Units by mouth daily.         Marland Kitchen esomeprazole (NEXIUM) 20 MG capsule   Oral   Take 20 mg by mouth daily at  12 noon.         Marland Kitchen ibuprofen (ADVIL,MOTRIN) 200 MG tablet   Oral   Take 200 mg by mouth daily.         . Multiple Vitamins-Minerals (ANTIOXIDANT FORMULA SG) capsule   Oral   Take 2 capsules by mouth daily.         . Multiple Vitamins-Minerals (CENTRUM SILVER PO)   Oral   Take 1 tablet by mouth daily.         . naproxen sodium (ANAPROX) 220 MG tablet   Oral   Take 220 mg by mouth 2 (two) times daily with a meal. Reported on 08/03/2015         . omega-3 acid ethyl esters (LOVAZA) 1 g capsule   Oral   Take 1 g by mouth daily.         Marland Kitchen venlafaxine XR (EFFEXOR-XR) 150 MG 24 hr capsule   Oral   Take 150 mg by mouth daily with breakfast.           Allergies Actonel; Alendronate; Codeine; Meloxicam; Penicillins; Varenicline; Wellbutrin; and Wine  Family History  Problem Relation Age of Onset  . Breast  cancer Neg Hx     Social History Social History  Substance Use Topics  . Smoking status: Current Every Day Smoker -- 1.00 packs/day for 50 years    Types: Cigarettes  . Smokeless tobacco: Never Used  . Alcohol Use: 8.4 oz/week    14 Shots of liquor per week    Review of Systems Constitutional: No fever/chills Cardiovascular: Denies chest pain. Respiratory: Denies shortness of breath. Genitourinary: Negative for dysuria. Musculoskeletal: Right hip pain. Skin: Negative for rash. Neurological: Negative for headaches, focal weakness or numbness.  10-point ROS otherwise negative.  ____________________________________________   PHYSICAL EXAM:  VITAL SIGNS: ED Triage Vitals  Enc Vitals Group     BP --      Pulse --      Resp --      Temp --      Temp src --      SpO2 --      Weight --      Height --      Head Cir --      Peak Flow --      Pain Score --      Pain Loc --      Pain Edu? --      Excl. in [place]? --     Constitutional: Alert and oriented. Well appearing and in no acute distress. Eyes: Conjunctivae are normal. PERRL. EOMI. Head:  Atraumatic. Nose: No congestion/rhinnorhea. Mouth/Throat: Mucous membranes are moist.  Oropharynx non-erythematous. Neck: No stridor.   Cardiovascular: Normal rate, regular rhythm. Grossly normal heart sounds.  Good peripheral circulation. Respiratory: Normal respiratory effort.  No retractions. Lungs CTAB. Gastrointestinal: Soft and nontender. No distention. No abdominal bruits. No CVA tenderness. Musculoskeletal: No lower extremity tenderness nor edema.  No joint effusions. Neurologic:  Normal speech and language. No gross focal neurologic deficits are appreciated. No gait instability. Skin:  Skin is warm, dry and intact. No rash noted. Psychiatric: Mood and affect are normal. Speech and behavior are normal.  ____________________________________________   LABS (all labs ordered are listed, but only abnormal results are displayed)  Labs Reviewed  URINALYSIS COMPLETEWITH MICROSCOPIC ([place] ONLY) - Abnormal; Notable for the following:    Color, Urine AMBER (*)    APPearance HAZY (*)    Ketones, ur TRACE (*)    Squamous Epithelial / LPF 0-5 (*)    All other components within normal limits  CBC WITH DIFFERENTIAL/PLATELET  COMPREHENSIVE METABOLIC PANEL  TYPE AND SCREEN  ABO/RH   ____________________________________________  EKG   ____________________________________________  RADIOLOGY  IMPRESSION: Displaced intertrochanteric right femur fracture. ____________________________________________   PROCEDURES  Procedure(s) performed: None  Critical Care performed: No  ____________________________________________   INITIAL IMPRESSION / ASSESSMENT AND PLAN / ED COURSE  Pertinent labs & imaging results that were available during my care of the patient were reviewed by me and considered in my medical decision making (see chart for details).  Status post fall with acute comminuted proximal femur fracture right leg. Discussed all clinical findings with orthopedics on call  will admit to orthopedic surgery. Basic labs chest x-ray and type and cross obtained upon arrival. ____________________________________________   FINAL CLINICAL IMPRESSION(S) / ED DIAGNOSES  Final diagnoses:  Closed fracture of neck of right femur, initial encounter ([place])     This chart was dictated using voice recognition software/Dragon. Despite best efforts to proofread, errors can occur which can change the meaning. Any change was purely unintentional.   Arlyss Repress, PA-C 11/25/15 [place],  MD 11/25/15 2349

## 2015-11-25 NOTE — ED Notes (Signed)
Admitting MD at bedside.

## 2015-11-25 NOTE — Consult Note (Signed)
ORTHOPAEDIC CONSULTATION  REQUESTING PHYSICIAN: Lytle Butte, MD  Chief Complaint: Right hip pain   HPI: April Rogers is a 75 y.o. female who complains of  Right hip pain following a fall this afternoon.  Brought to ER where exam and x-rays show a comminuted, displaced right intertrochanteric hip fx.  Admitted for surgery and medical clearance which already has been done.  Discussed treatment with the patient and daughter who agree with surgical fixation of the fracture tomorrow.  Risks and benefits and post op protocol discussed.    Past Medical History  Diagnosis Date  . Anxiety   . Arthritis   . Depression   . GERD (gastroesophageal reflux disease)   . Hypertension   . Cancer ([place])   . Macular degeneration of both eyes   . Fracture of one rib    Past Surgical History  Procedure Laterality Date  . Cholecystectomy    . Tonsillectomy    . Tubal ligation    . Wrist reconstruction Right   . Arm skin lesion biopsy / excision Right   . Colonoscopy with propofol N/A 08/03/2015    Procedure: COLONOSCOPY WITH PROPOFOL;  Surgeon: Hulen Luster, MD;  Location: [facility] ENDOSCOPY;  Service: Gastroenterology;  Laterality: N/A;   Social History   Social History  . Marital Status: Divorced    Spouse Name: N/A  . Number of Children: N/A  . Years of Education: N/A   Social History Main Topics  . Smoking status: Current Every Day Smoker -- 1.00 packs/day for 50 years    Types: Cigarettes  . Smokeless tobacco: Never Used  . Alcohol Use: 8.4 oz/week    14 Shots of liquor per week  . Drug Use: No  . Sexual Activity: Not Asked   Other Topics Concern  . None   Social History Narrative   Family History  Problem Relation Age of Onset  . Breast cancer Neg Hx   . Hypertension Other    Allergies  Allergen Reactions  . Actonel [Risedronate]   . Alendronate   . Codeine   . Meloxicam   . Penicillins   . Varenicline   . Wellbutrin [Bupropion]   . Wine [Alcohol] Hives   Prior to  Admission medications   Medication Sig Start Date End Date Taking? Authorizing Provider  amLODipine (NORVASC) 5 MG tablet Take 5 mg by mouth daily.    Historical Provider, MD  aspirin (ASPIRIN EC) 81 MG EC tablet Take 81 mg by mouth daily. Swallow whole.    Historical Provider, MD  b complex vitamins tablet Take 1 tablet by mouth daily.    Historical Provider, MD  Cholecalciferol 10000 units TABS Take 1,000 Units by mouth daily.    Historical Provider, MD  esomeprazole (NEXIUM) 20 MG capsule Take 20 mg by mouth daily at 12 noon.    Historical Provider, MD  ibuprofen (ADVIL,MOTRIN) 200 MG tablet Take 200 mg by mouth daily.    Historical Provider, MD  Multiple Vitamins-Minerals (ANTIOXIDANT FORMULA SG) capsule Take 2 capsules by mouth daily.    Historical Provider, MD  Multiple Vitamins-Minerals (CENTRUM SILVER PO) Take 1 tablet by mouth daily.    Historical Provider, MD  naproxen sodium (ANAPROX) 220 MG tablet Take 220 mg by mouth 2 (two) times daily with a meal. Reported on 08/03/2015    Historical Provider, MD  omega-3 acid ethyl esters (LOVAZA) 1 g capsule Take 1 g by mouth daily.    Historical Provider, MD  venlafaxine XR [facility])  150 MG 24 hr capsule Take 150 mg by mouth daily with breakfast.    Historical Provider, MD   Dg Chest 1 View  11/25/2015  CLINICAL DATA:  Right hip pain and deformity after falling today. Initial encounter. EXAM: CHEST 1 VIEW COMPARISON:  Radiographs 03/08/2008. FINDINGS: 1549 hours. The heart size is stable. There is progressive widening of superior mediastinum, probably due to vascular ectasia, lower lung volumes and AP technique. There is increased interstitial prominence throughout the lungs without overt pulmonary edema, confluent airspace opacity, pleural effusion or pneumothorax. No acute osseous findings are seen. IMPRESSION: No definite acute chest findings. There are lower lung volumes with resulting increased prominence of the superior mediastinum and  interstitial markings. Consider follow-up erect 2 view examination after treatment of patient's hip fracture. Electronically Signed   By: Richardean Sale M.D.   On: 11/25/2015 16:27   Dg Hip Unilat With Pelvis 2-3 Views Right  11/25/2015  CLINICAL DATA:  Fall with right hip pain.  Initial encounter. EXAM: DG HIP (WITH OR WITHOUT PELVIS) 2-3V RIGHT COMPARISON:  None. FINDINGS: Intertrochanteric right femur fracture with displacement and varus angulation. Rotated pelvis with no evidence of fracture or diastasis. Located hips. Osteopenia. IMPRESSION: Displaced intertrochanteric right femur fracture. Electronically Signed   By: Monte Fantasia M.D.   On: 11/25/2015 16:21    Positive ROS: All other systems have been reviewed and were otherwise negative with the exception of those mentioned in the HPI and as above.  Physical Exam: General: Alert, no acute distress Cardiovascular: No pedal edema Respiratory: No cyanosis, no use of accessory musculature GI: No organomegaly, abdomen is soft and non-tender Skin: No lesions in the area of chief complaint Neurologic: Sensation intact distally Psychiatric: Patient is competent for consent with normal mood and affect Lymphatic: No axillary or cervical lymphadenopathy  MUSCULOSKELETAL: Right leg short and severly rotated.  csm good.  Skin intact except for abrasion right shin.  Severe pain with attempts at movement.  No other injuries noted.   Assessment: Severe right intertrochanteric hip fx.   Plan: ORIF tomorrow with IM nail    Park Breed, MD [phone]   11/25/2015 5:58 PM

## 2015-11-25 NOTE — Progress Notes (Signed)
Pain poorly controlled will change frequency of hydromorphone - if remains an issue place on pca

## 2015-11-25 NOTE — ED Notes (Signed)
Dr Jonny Ruiz in with pt and family

## 2015-11-26 ENCOUNTER — Inpatient Hospital Stay: Payer: Commercial Managed Care - HMO | Admitting: Anesthesiology

## 2015-11-26 ENCOUNTER — Encounter: Payer: Self-pay | Admitting: *Deleted

## 2015-11-26 ENCOUNTER — Encounter
Admission: EM | Disposition: A | Discharge status: Skilled Nursing Facility | Payer: Self-pay | Source: Home / Self Care | Attending: Internal Medicine

## 2015-11-26 ENCOUNTER — Inpatient Hospital Stay: Payer: Commercial Managed Care - HMO

## 2015-11-26 HISTORY — PX: INTRAMEDULLARY (IM) NAIL INTERTROCHANTERIC: SHX5875

## 2015-11-26 LAB — CBC
HCT: 34 % — ABNORMAL LOW (ref 35.0–47.0)
HEMOGLOBIN: 11.9 g/dL — AB (ref 12.0–16.0)
MCH: 33.9 pg (ref 26.0–34.0)
MCHC: 35.1 g/dL (ref 32.0–36.0)
MCV: 96.6 fL (ref 80.0–100.0)
PLATELETS: 172 10*3/uL (ref 150–440)
RBC: 3.52 MIL/uL — AB (ref 3.80–5.20)
RDW: 12.4 % (ref 11.5–14.5)
WBC: 8.9 10*3/uL (ref 3.6–11.0)

## 2015-11-26 LAB — BASIC METABOLIC PANEL
Anion gap: 6 (ref 5–15)
BUN: 14 mg/dL (ref 6–20)
CALCIUM: 8.3 mg/dL — AB (ref 8.9–10.3)
CO2: 29 mmol/L (ref 22–32)
Chloride: 100 mmol/L — ABNORMAL LOW (ref 101–111)
Creatinine, Ser: 0.49 mg/dL (ref 0.44–1.00)
GFR calc Af Amer: >60 mL/min (ref >60)
GLUCOSE: 117 mg/dL — AB (ref 65–99)
Potassium: 3.7 mmol/L (ref 3.5–5.1)
Sodium: 135 mmol/L (ref 135–145)

## 2015-11-26 SURGERY — FIXATION, FRACTURE, INTERTROCHANTERIC, WITH INTRAMEDULLARY ROD
Anesthesia: General | Laterality: Right | Wound class: Clean

## 2015-11-26 MED ORDER — ONDANSETRON HCL 4 MG PO TABS: 4.0000 mg | ORAL_TABLET | Freq: Four times a day (QID) | ORAL | Status: DC | PRN

## 2015-11-26 MED ORDER — VENLAFAXINE HCL ER 75 MG PO CP24: 75.0000 mg | ORAL_CAPSULE | Freq: Two times a day (BID) | ORAL | Status: DC

## 2015-11-26 MED ORDER — MORPHINE SULFATE (PF) 2 MG/ML IV SOLN
0.5000 mg | INTRAVENOUS | Status: DC | PRN
  Administered 2015-11-26: 0.5 mg via INTRAVENOUS
  Filled 2015-11-26: qty 1

## 2015-11-26 MED ORDER — PHENYLEPHRINE HCL 10 MG/ML IJ SOLN
INTRAMUSCULAR | Status: DC | PRN
  Administered 2015-11-26 (×2): 100 ug via INTRAVENOUS

## 2015-11-26 MED ORDER — NEOMYCIN-POLYMYXIN B GU 40-200000 IR SOLN
Status: DC | PRN
  Administered 2015-11-26: 2 mL

## 2015-11-26 MED ORDER — ALUM & MAG HYDROXIDE-SIMETH 200-200-20 MG/5ML PO SUSP: 30.0000 mL | ORAL | Status: DC | PRN

## 2015-11-26 MED ORDER — ACETAMINOPHEN 650 MG RE SUPP: 650.0000 mg | Freq: Four times a day (QID) | RECTAL | Status: DC | PRN

## 2015-11-26 MED ORDER — FLEET ENEMA 7-19 GM/118ML RE ENEM: 1.0000 | ENEMA | Freq: Once | RECTAL | Status: DC | PRN

## 2015-11-26 MED ORDER — NEOSTIGMINE METHYLSULFATE 10 MG/10ML IV SOLN
INTRAVENOUS | Status: DC | PRN
  Administered 2015-11-26: 3 mg via INTRAVENOUS

## 2015-11-26 MED ORDER — PHENOL 1.4 % MT LIQD
1.0000 | OROMUCOSAL | Status: DC | PRN
Start: 2015-11-26 — End: 2015-11-28
  Filled 2015-11-26: qty 177

## 2015-11-26 MED ORDER — VANCOMYCIN HCL IN DEXTROSE 1-5 GM/200ML-% IV SOLN
1000.0000 mg | Freq: Two times a day (BID) | INTRAVENOUS | Status: AC
  Administered 2015-11-26 – 2015-11-27 (×2): 1000 mg via INTRAVENOUS
  Filled 2015-11-26 (×4): qty 200

## 2015-11-26 MED ORDER — DEXAMETHASONE SODIUM PHOSPHATE 10 MG/ML IJ SOLN
INTRAMUSCULAR | Status: DC | PRN
  Administered 2015-11-26: 4 mg via INTRAVENOUS

## 2015-11-26 MED ORDER — ONDANSETRON HCL 4 MG/2ML IJ SOLN
INTRAMUSCULAR | Status: DC | PRN
  Administered 2015-11-26: 4 mg via INTRAVENOUS

## 2015-11-26 MED ORDER — ENOXAPARIN SODIUM 40 MG/0.4ML ~~LOC~~ SOLN
40.0000 mg | SUBCUTANEOUS | Status: DC
  Administered 2015-11-27 – 2015-11-28 (×2): 40 mg via SUBCUTANEOUS
  Filled 2015-11-26 (×2): qty 0.4

## 2015-11-26 MED ORDER — SUCCINYLCHOLINE CHLORIDE 20 MG/ML IJ SOLN
INTRAMUSCULAR | Status: DC | PRN
  Administered 2015-11-26: 80 mg via INTRAVENOUS

## 2015-11-26 MED ORDER — SENNA 8.6 MG PO TABS
1.0000 | ORAL_TABLET | Freq: Two times a day (BID) | ORAL | Status: DC
  Administered 2015-11-26 – 2015-11-28 (×5): 8.6 mg via ORAL
  Filled 2015-11-26 (×5): qty 1

## 2015-11-26 MED ORDER — BISACODYL 10 MG RE SUPP
10.0000 mg | Freq: Every day | RECTAL | Status: DC | PRN
  Administered 2015-11-28: 10 mg via RECTAL
  Filled 2015-11-26: qty 1

## 2015-11-26 MED ORDER — GLYCOPYRROLATE 0.2 MG/ML IJ SOLN
INTRAMUSCULAR | Status: DC | PRN
  Administered 2015-11-26: 0.4 mg via INTRAVENOUS

## 2015-11-26 MED ORDER — SODIUM CHLORIDE 0.45 % IV SOLN
INTRAVENOUS | Status: DC
  Administered 2015-11-26 – 2015-11-27 (×2): via INTRAVENOUS

## 2015-11-26 MED ORDER — VENLAFAXINE HCL ER 75 MG PO CP24
75.0000 mg | ORAL_CAPSULE | Freq: Two times a day (BID) | ORAL | Status: DC
  Administered 2015-11-26 – 2015-11-28 (×4): 75 mg via ORAL
  Filled 2015-11-26 (×4): qty 1

## 2015-11-26 MED ORDER — ZOLPIDEM TARTRATE 5 MG PO TABS
5.0000 mg | ORAL_TABLET | Freq: Every evening | ORAL | Status: DC | PRN
  Administered 2015-11-27: 5 mg via ORAL
  Filled 2015-11-26: qty 1

## 2015-11-26 MED ORDER — PROPOFOL 10 MG/ML IV BOLUS
INTRAVENOUS | Status: DC | PRN
  Administered 2015-11-26: 110 mg via INTRAVENOUS

## 2015-11-26 MED ORDER — NEOMYCIN-POLYMYXIN B GU 40-200000 IR SOLN
Status: AC
  Filled 2015-11-26: qty 2

## 2015-11-26 MED ORDER — ACETAMINOPHEN 10 MG/ML IV SOLN
INTRAVENOUS | Status: AC
  Filled 2015-11-26: qty 100

## 2015-11-26 MED ORDER — BUPIVACAINE-EPINEPHRINE (PF) 0.25% -1:200000 IJ SOLN
INTRAMUSCULAR | Status: DC | PRN
  Administered 2015-11-26: 30 mL via PERINEURAL

## 2015-11-26 MED ORDER — MENTHOL 3 MG MT LOZG
1.0000 | LOZENGE | OROMUCOSAL | Status: DC | PRN
  Filled 2015-11-26: qty 9

## 2015-11-26 MED ORDER — FENTANYL CITRATE (PF) 100 MCG/2ML IJ SOLN
INTRAMUSCULAR | Status: AC
  Filled 2015-11-26: qty 2

## 2015-11-26 MED ORDER — LACTATED RINGERS IV SOLN
INTRAVENOUS | Status: DC
  Administered 2015-11-26: 100 mL/h via INTRAVENOUS

## 2015-11-26 MED ORDER — FENTANYL CITRATE (PF) 100 MCG/2ML IJ SOLN
INTRAMUSCULAR | Status: DC | PRN
  Administered 2015-11-26: 50 ug via INTRAVENOUS
  Administered 2015-11-26: 25 ug via INTRAVENOUS
  Administered 2015-11-26: 50 ug via INTRAVENOUS
  Administered 2015-11-26 (×2): 25 ug via INTRAVENOUS

## 2015-11-26 MED ORDER — ROCURONIUM BROMIDE 100 MG/10ML IV SOLN
INTRAVENOUS | Status: DC | PRN
  Administered 2015-11-26: 5 mg via INTRAVENOUS
  Administered 2015-11-26: 20 mg via INTRAVENOUS

## 2015-11-26 MED ORDER — ACETAMINOPHEN 500 MG PO TABS
1000.0000 mg | ORAL_TABLET | Freq: Four times a day (QID) | ORAL | Status: AC
  Administered 2015-11-26 – 2015-11-27 (×2): 1000 mg via ORAL
  Filled 2015-11-26 (×2): qty 2

## 2015-11-26 MED ORDER — MAGNESIUM HYDROXIDE 400 MG/5ML PO SUSP
30.0000 mL | Freq: Every day | ORAL | Status: DC | PRN
  Administered 2015-11-28: 30 mL via ORAL
  Filled 2015-11-26: qty 30

## 2015-11-26 MED ORDER — ONDANSETRON HCL 4 MG/2ML IJ SOLN
4.0000 mg | Freq: Four times a day (QID) | INTRAMUSCULAR | Status: DC | PRN
  Filled 2015-11-26: qty 2

## 2015-11-26 MED ORDER — METOCLOPRAMIDE HCL 10 MG PO TABS: 5.0000 mg | ORAL_TABLET | Freq: Three times a day (TID) | ORAL | Status: DC | PRN

## 2015-11-26 MED ORDER — LIDOCAINE HCL (CARDIAC) 20 MG/ML IV SOLN
INTRAVENOUS | Status: DC | PRN
  Administered 2015-11-26: 60 mg via INTRAVENOUS

## 2015-11-26 MED ORDER — METOCLOPRAMIDE HCL 5 MG/ML IJ SOLN: 5.0000 mg | Freq: Three times a day (TID) | INTRAMUSCULAR | Status: DC | PRN

## 2015-11-26 MED ORDER — AMLODIPINE BESYLATE 5 MG PO TABS
5.0000 mg | ORAL_TABLET | Freq: Every day | ORAL | Status: DC
  Administered 2015-11-26 – 2015-11-28 (×2): 5 mg via ORAL
  Filled 2015-11-26 (×3): qty 1

## 2015-11-26 MED ORDER — ACETAMINOPHEN 10 MG/ML IV SOLN
INTRAVENOUS | Status: DC | PRN
  Administered 2015-11-26: 1000 mg via INTRAVENOUS

## 2015-11-26 MED ORDER — FERROUS SULFATE 325 (65 FE) MG PO TABS
325.0000 mg | ORAL_TABLET | Freq: Every day | ORAL | Status: DC
  Administered 2015-11-28: 325 mg via ORAL
  Filled 2015-11-26 (×2): qty 1

## 2015-11-26 MED ORDER — BUPIVACAINE-EPINEPHRINE (PF) 0.25% -1:200000 IJ SOLN
INTRAMUSCULAR | Status: AC
  Filled 2015-11-26: qty 30

## 2015-11-26 MED ORDER — MIDAZOLAM HCL 2 MG/2ML IJ SOLN
INTRAMUSCULAR | Status: DC | PRN
  Administered 2015-11-26: 2 mg via INTRAVENOUS

## 2015-11-26 MED ORDER — FENTANYL CITRATE (PF) 100 MCG/2ML IJ SOLN
25.0000 ug | INTRAMUSCULAR | Status: DC | PRN
  Administered 2015-11-26 (×2): 50 ug via INTRAVENOUS

## 2015-11-26 MED ORDER — HYDROMORPHONE HCL 1 MG/ML IJ SOLN: 0.2500 mg | INTRAMUSCULAR | Status: DC | PRN

## 2015-11-26 MED ORDER — ACETAMINOPHEN 325 MG PO TABS
650.0000 mg | ORAL_TABLET | Freq: Four times a day (QID) | ORAL | Status: DC | PRN
  Filled 2015-11-26: qty 2

## 2015-11-26 MED ORDER — HYDROCODONE-ACETAMINOPHEN 5-325 MG PO TABS
1.0000 | ORAL_TABLET | Freq: Four times a day (QID) | ORAL | Status: DC | PRN
  Administered 2015-11-26 – 2015-11-28 (×6): 2 via ORAL
  Filled 2015-11-26 (×6): qty 2

## 2015-11-26 SURGICAL SUPPLY — 33 items
BLADE INTRAMED NAIL 11X85 (Orthopedic Implant) ×2 IMPLANT
BLADE INTRAMED NAIL 11X85MML (Orthopedic Implant) ×1 IMPLANT
BNDG COHESIVE 4X5 TAN STRL (GAUZE/BANDAGES/DRESSINGS) ×3 IMPLANT
CANISTER SUCT 1200ML W/VALVE (MISCELLANEOUS) ×3 IMPLANT
CHLORAPREP W/TINT 26ML (MISCELLANEOUS) ×6 IMPLANT
DRAPE C-ARMOR (DRAPES) ×3 IMPLANT
DRAPE INCISE 23X17 IOBAN STRL (DRAPES) ×2
DRAPE INCISE IOBAN 23X17 STRL (DRAPES) ×1 IMPLANT
DRSG AQUACEL AG ADV 3.5X10 (GAUZE/BANDAGES/DRESSINGS) IMPLANT
DRSG AQUACEL AG ADV 3.5X14 (GAUZE/BANDAGES/DRESSINGS) IMPLANT
ELECT REM PT RETURN 9FT ADLT (ELECTROSURGICAL) ×3
ELECTRODE REM PT RTRN 9FT ADLT (ELECTROSURGICAL) ×1 IMPLANT
GAUZE PETRO XEROFOAM 1X8 (MISCELLANEOUS) IMPLANT
GAUZE SPONGE 4X4 12PLY STRL (GAUZE/BANDAGES/DRESSINGS) ×3 IMPLANT
GLOVE INDICATOR 8.0 STRL GRN (GLOVE) ×3 IMPLANT
GLOVE SURG ORTHO 8.5 STRL (GLOVE) ×3 IMPLANT
GOWN STRL REUS W/ TWL LRG LVL3 (GOWN DISPOSABLE) ×2 IMPLANT
GOWN STRL REUS W/TWL LRG LVL3 (GOWN DISPOSABLE) ×6
KIT RM TURNOVER STRD PROC AR (KITS) ×3 IMPLANT
MAT BLUE FLOOR 46X72 FLO (MISCELLANEOUS) ×3 IMPLANT
NAIL TROCH FX 11/130D 170-S (Nail) ×3 IMPLANT
NEEDLE SPNL 18GX3.5 QUINCKE PK (NEEDLE) ×3 IMPLANT
NS IRRIG 500ML POUR BTL (IV SOLUTION) ×3 IMPLANT
PACK HIP COMPR (MISCELLANEOUS) ×3 IMPLANT
REAMER ROD DEEP FLUTE 2.5X950 (INSTRUMENTS) ×3 IMPLANT
SCREW LOCK TI 5.0X34 F/IM NAIL (Screw) ×3 IMPLANT
STAPLER SKIN PROX 35W (STAPLE) ×3 IMPLANT
SUCTION FRAZIER HANDLE 10FR (MISCELLANEOUS) ×2
SUCTION TUBE FRAZIER 10FR DISP (MISCELLANEOUS) ×1 IMPLANT
SUT VIC AB 0 CT1 36 (SUTURE) ×3 IMPLANT
SUT VIC AB 2-0 CT1 27 (SUTURE) ×3
SUT VIC AB 2-0 CT1 TAPERPNT 27 (SUTURE) ×1 IMPLANT
SYR 30ML LL (SYRINGE) ×3 IMPLANT

## 2015-11-26 NOTE — Op Note (Signed)
DATE OF SURGERY:  11/26/2015  TIME: 12:41 PM  PATIENT NAME:  April Rogers  AGE: 75 y.o.  PRE-OPERATIVE DIAGNOSIS: right displaced intertrochanteric   hip fracture  POST-OPERATIVE DIAGNOSIS:  SAME  PROCEDURE:  INTRAMEDULLARY (IM) NAIL INTERTROCHANTRIC  SURGEON:  Gurley Climer E  EBL: 123XX123 cc  COMPLICATIONS: none  OPERATIVE IMPLANTS: Synthes trochanteric femoral nail 12m/130* with interlocking helical blade 0000000  and distal locking screw 29mm  PREOPERATIVE INDICATIONS:  [name] is a 75 y.o. year old who fell and suffered a hip fracture. She was brought into the ER and then admitted and optimized and then elected for surgical intervention.    The risks benefits and alternatives were discussed with the patient including but not limited to the risks of nonoperative treatment, versus surgical intervention including infection, bleeding, nerve injury, malunion, nonunion, hardware prominence, hardware failure, need for hardware removal, blood clots, cardiopulmonary complications, morbidity, mortality, among others, and they were willing to proceed.    OPERATIVE PROCEDURE:  The patient was brought to the operating room and placed in the supine position. General ET anesthesia was administered, with a foley. She was placed on the fracture table.  Closed reduction was performed under C-arm guidance. The length of the femur was also measured using fluoroscopy. Time out was then performed after sterile prep and drape. She received preoperative antibiotics.  Incision was made proximal to the greater trochanter. A guidewire was placed in the appropriate position. Confirmation was made on AP and lateral views. The above-named nail was opened. I opened the proximal femur with a reamer. I then placed the nail by hand easily down. I did not need to ream the femur.  Once the nail was completely seated, I placed a guidepin into the femoral head into the center center position through a second incision.   I measured the length, and then reamed the lateral cortex and up into the head. I then placed the helical blade. Slight compression was applied. Anatomic fixation achieved. Bone quality was mediocre.  I then secured the proximal interlock.  The distal locking screw was then placed and after confirming the position of the fracture fragments and hardware I then removed the instruments, and took final C-arm pictures AP and lateral the entire length of the leg. Anatomic reconstruction was achieved, and the wounds were irrigated copiously and closed with Vicryl  followed by staples and sterile dressing  for the skin. Sponge and needle count were correct.   The patient was awakened and returned to PACU in stable and satisfactory condition. There no complications and the patient tolerated the procedure well.  She will be partial weightbearing as tolerated, and will be on Lovenox  For DVT prophylaxis.     Park Breed, M.D.

## 2015-11-26 NOTE — NC FL2 (Signed)
[place] LEVEL OF CARE SCREENING TOOL     IDENTIFICATION  Patient Name: April Rogers Birthdate: 04-Sep-1940 Sex: female Admission Date (Current Location): 11/25/2015  [place] and [place] Number:  Engineering geologist and Address:  [facility], [address] [place], [place] [address]      Provider Number: [id number]  Attending Physician Name and Address:  Bettey Costa, MD  Relative Name and Phone Number:       Current Level of Care: Hospital Recommended Level of Care: [place] Prior Approval Number:    Date Approved/Denied:   PASRR Number:  ([id number])  Discharge Plan: SNF    Current Diagnoses: Patient Active Problem List   Diagnosis Date Noted  . Femur fracture, right, closed, initial encounter 11/25/2015    Orientation RESPIRATION BLADDER Height & Weight     Self, Time, Situation, Place  Normal Continent Weight: 133 lb (60.328 kg) Height:  5\' 4"  (162.6 cm)  BEHAVIORAL SYMPTOMS/MOOD NEUROLOGICAL BOWEL NUTRITION STATUS   (none )  (none ) Continent Diet (Regular Diet )  AMBULATORY STATUS COMMUNICATION OF NEEDS Skin   Extensive Assist Verbally Surgical wounds                       Personal Care Assistance Level of Assistance  Bathing, Feeding, Dressing Bathing Assistance: Limited assistance Feeding assistance: Independent Dressing Assistance: Limited assistance     Functional Limitations Info  Sight, Hearing, Speech Sight Info: Adequate Hearing Info: Adequate Speech Info: Adequate    SPECIAL CARE FACTORS FREQUENCY  PT (By licensed PT), OT (By licensed OT)     PT Frequency:  (5) OT Frequency:  (5)            Contractures      Additional Factors Info  Code Status, Allergies Code Status Info:  (Full Code. ) Allergies Info:  (Penicillins, Actonel, Alendronate, Codeine, Meloxicam, Percocet, Varenicline, Wellbutrin, Wine)           Current Medications (11/26/2015):  This is the  current hospital active medication list Current Facility-Administered Medications  Medication Dose Route Frequency Provider Last Rate Last Dose  . 0.9 %  sodium chloride infusion   Intravenous Continuous Lytle Butte, MD      . Doug Sou Hold] acetaminophen (TYLENOL) tablet 650 mg  650 mg Oral Q6H PRN Lytle Butte, MD       Or  . Doug Sou Hold] acetaminophen (TYLENOL) suppository 650 mg  650 mg Rectal Q6H PRN Lytle Butte, MD      . Doug Sou Hold] amLODipine (NORVASC) tablet 5 mg  5 mg Oral Daily Lytle Butte, MD      . Doug Sou Hold] cholecalciferol (VITAMIN D) tablet 1,000 Units  1,000 Units Oral Daily Lytle Butte, MD      . Doug Sou Hold] docusate sodium (COLACE) capsule 100 mg  100 mg Oral BID Lytle Butte, MD   100 mg at 11/25/15 2120  . [MAR Hold] HYDROmorphone (DILAUDID) injection 1 mg  1 mg Intravenous Q2H PRN Lytle Butte, MD   1 mg at 11/26/15 [id number]  . lactated ringers infusion   Intravenous Continuous Andria Frames, MD 100 mL/hr at 11/26/15 1110 100 mL/hr at 11/26/15 1110  . [MAR Hold] methocarbamol (ROBAXIN) tablet 500 mg  500 mg Oral Q6H PRN Earnestine Leys, MD   500 mg at 11/25/15 2004   Or  . [MAR Hold] methocarbamol (ROBAXIN) 500 mg in dextrose 5 % 50  mL IVPB  500 mg Intravenous Q6H PRN Earnestine Leys, MD      . Doug Sou Hold] multivitamin (RENA-VIT) tablet 1 tablet  1 tablet Oral Daily Lytle Butte, MD      . Doug Sou Hold] multivitamin with minerals tablet 1 tablet  1 tablet Oral Daily Lytle Butte, MD      . Doug Sou Hold] nicotine (NICODERM CQ - dosed in mg/24 hours) patch 21 mg  21 mg Transdermal Daily Lytle Butte, MD   21 mg at 11/25/15 2239  . [MAR Hold] omega-3 acid ethyl esters (LOVAZA) capsule 1 g  1 g Oral Daily Lytle Butte, MD      . Doug Sou Hold] ondansetron [facility]) tablet 4 mg  4 mg Oral Q6H PRN Lytle Butte, MD       Or  . Doug Sou Hold] ondansetron [facility]) injection 4 mg  4 mg Intravenous Q6H PRN Lytle Butte, MD      . Doug Sou Hold] oxyCODONE (Oxy IR/ROXICODONE) immediate release  tablet 5-10 mg  5-10 mg Oral Q4H PRN Earnestine Leys, MD   10 mg at 11/26/15 0404  . [MAR Hold] pantoprazole (PROTONIX) EC tablet 40 mg  40 mg Oral Daily Lytle Butte, MD      . Doug Sou Hold] polyethylene glycol (MIRALAX / GLYCOLAX) packet 17 g  17 g Oral Daily PRN Lytle Butte, MD      . Doug Sou Hold] potassium chloride SA (K-DUR,KLOR-CON) CR tablet 40 mEq  40 mEq Oral BID Lytle Butte, MD   40 mEq at 11/25/15 2120  . [MAR Hold] senna (SENOKOT) tablet 8.6 mg  1 tablet Oral BID Earnestine Leys, MD   8.6 mg at 11/25/15 2120  . vancomycin (VANCOCIN) 1,500 mg in sodium chloride 0.9 % 500 mL IVPB  1,500 mg Intravenous Once Earnestine Leys, MD   1,500 mg at 11/26/15 1022  . [MAR Hold] venlafaxine XR (EFFEXOR-XR) 24 hr capsule 75 mg  75 mg Oral BID Lance Coon, MD      . Doug Sou Hold] zolpidem [facility]) tablet 5 mg  5 mg Oral QHS PRN Earnestine Leys, MD   5 mg at 11/25/15 2122   Facility-Administered Medications Ordered in Other Encounters  Medication Dose Route Frequency Provider Last Rate Last Dose  . rocuronium [facility]) injection    Anesthesia Intra-op Lance Muss, CRNA   20 mg at 11/26/15 1144     Discharge Medications: Please see discharge summary for a list of discharge medications.  Relevant Imaging Results:  Relevant Lab Results:   Additional Information  (SSN: [id number])  Loralyn Freshwater, LCSW

## 2015-11-26 NOTE — Anesthesia Procedure Notes (Addendum)
Procedure Name: Intubation Performed by: Lance Muss Pre-anesthesia Checklist: Emergency Drugs available, Patient identified, Suction available, Patient being monitored and Timeout performed Patient Re-evaluated:Patient Re-evaluated prior to inductionOxygen Delivery Method: Circle system utilized Preoxygenation: Pre-oxygenation with 100% oxygen Intubation Type: IV induction Ventilation: Mask ventilation without difficulty Laryngoscope Size: Mac and 3 Grade View: Grade I Tube type: Oral Tube size: 7.0 mm Number of attempts: 1 Airway Equipment and Method: Stylet Placement Confirmation: ETT inserted through vocal cords under direct vision,  positive ETCO2 and breath sounds checked- equal and bilateral Secured at: 23 cm Tube secured with: Tape Dental Injury: Teeth and Oropharynx as per pre-operative assessment

## 2015-11-26 NOTE — Transfer of Care (Signed)
Immediate Anesthesia Transfer of Care Note  Patient: April Rogers  Procedure(s) Performed: Procedure(s): INTRAMEDULLARY (IM) NAIL INTERTROCHANTRIC (Right)  Patient Location: PACU  Anesthesia Type:General  Level of Consciousness: awake and responds to stimulation  Airway & Oxygen Therapy: Patient Spontanous Breathing and Patient connected to face mask oxygen  Post-op Assessment: Report given to RN and Post -op Vital signs reviewed and stable  Post vital signs: Reviewed and stable  Last Vitals:  Filed Vitals:   11/26/15 1057 11/26/15 1302  BP: 117/66 102/90  Pulse: 83 96  Temp: 36.1 C   Resp: 16 15    Last Pain:  Filed Vitals:   11/26/15 1303  PainSc: 5       Patients Stated Pain Goal: 1 ([id number])  Complications: No apparent anesthesia complications

## 2015-11-26 NOTE — Care Management Important Message (Signed)
Important Message  Patient Details  Name: April Rogers MRN: [id number] Date of Birth: July 08, 1941   Medicare Important Message Given:  Yes    Afifa Truax A, RN 11/26/2015, 7:03 AM

## 2015-11-26 NOTE — Progress Notes (Signed)
[place] [facility] [place] NAME: Terresa Khang    MR#:  [id number]  DATE OF BIRTH:  05/26/1941  SUBJECTIVE:    Pain is better controlled with pain medications. Patient is planned for OR today.  REVIEW OF SYSTEMS:    Review of Systems  Constitutional: Negative for fever, chills and malaise/fatigue.  HENT: Negative for ear discharge, ear pain, hearing loss, nosebleeds and sore throat.   Eyes: Negative for blurred vision and pain.  Respiratory: Negative for cough, hemoptysis, shortness of breath and wheezing.   Cardiovascular: Negative for chest pain, palpitations and leg swelling.  Gastrointestinal: Negative for nausea, vomiting, abdominal pain, diarrhea and blood in stool.  Genitourinary: Negative for dysuria.  Musculoskeletal: Positive for joint pain. Negative for back pain.  Neurological: Negative for dizziness, tremors, speech change, focal weakness, seizures and headaches.  Endo/Heme/Allergies: Does not bruise/bleed easily.  Psychiatric/Behavioral: Negative for depression, suicidal ideas and hallucinations.    Tolerating Diet:Nothing by mouth      DRUG ALLERGIES:   Allergies  Allergen Reactions  . Penicillins Hives  . Actonel [Risedronate]     Does not remember reaction  . Alendronate     Generic medicine .... Unsure of reaction  . Codeine     hallucinations  . Meloxicam     Unsure of reaction  . Percocet [Oxycodone-Acetaminophen]     hallucinations  . Varenicline     Unsure of medication or reaction  . Wellbutrin [Bupropion]     nightmares  . Wine [Alcohol] Hives    VITALS:  Blood pressure 117/66, pulse 83, temperature 97 F (36.1 C), temperature source Tympanic, resp. rate 16, height 5\' 4"  (1.626 m), weight 60.328 kg (133 lb), SpO2 95 %.  PHYSICAL EXAMINATION:   Physical Exam    LABORATORY PANEL:   CBC  Recent Labs Lab 11/26/15 0408  WBC 8.9  HGB 11.9*  HCT 34.0*  PLT 172    ------------------------------------------------------------------------------------------------------------------  Chemistries   Recent Labs Lab 11/25/15 1530  11/26/15 0408  NA 138  --  135  K 3.3*  --  3.7  CL 101  --  100*  CO2 27  --  29  GLUCOSE 105*  --  117*  BUN 20  --  14  CREATININE 0.66  --  0.49  CALCIUM 9.1  < > 8.3*  AST 30  --   --   ALT 31  --   --   ALKPHOS 74  --   --   BILITOT 0.7  --   --   < > = values in this interval not displayed. ------------------------------------------------------------------------------------------------------------------  Cardiac Enzymes No results for input(s): TROPONINI in the last 168 hours. ------------------------------------------------------------------------------------------------------------------  RADIOLOGY:  Dg Chest 1 View  11/25/2015  CLINICAL DATA:  Right hip pain and deformity after falling today. Initial encounter. EXAM: CHEST 1 VIEW COMPARISON:  Radiographs 03/08/2008. FINDINGS: 1549 hours. The heart size is stable. There is progressive widening of superior mediastinum, probably due to vascular ectasia, lower lung volumes and AP technique. There is increased interstitial prominence throughout the lungs without overt pulmonary edema, confluent airspace opacity, pleural effusion or pneumothorax. No acute osseous findings are seen. IMPRESSION: No definite acute chest findings. There are lower lung volumes with resulting increased prominence of the superior mediastinum and interstitial markings. Consider follow-up erect 2 view examination after treatment of patient's hip fracture. Electronically Signed   By: Richardean Sale M.D.   On: 11/25/2015 16:27   Dg Hip Unilat  With Pelvis 2-3 Views Right  11/25/2015  CLINICAL DATA:  Fall with right hip pain.  Initial encounter. EXAM: DG HIP (WITH OR WITHOUT PELVIS) 2-3V RIGHT COMPARISON:  None. FINDINGS: Intertrochanteric right femur fracture with displacement and varus  angulation. Rotated pelvis with no evidence of fracture or diastasis. Located hips. Osteopenia. IMPRESSION: Displaced intertrochanteric right femur fracture. Electronically Signed   By: Monte Fantasia M.D.   On: 11/25/2015 16:21     ASSESSMENT AND PLAN:   75 year old female with essential hypertension and esophagitis status post mechanical fall and unfortunately has suffered right hip fracture.   1. Intertrochanteric fracture of right hip fracture status post mechanical fall: Patient is planned for a wire today. Continue supportive pain management and DVT prophylaxis per orthopedics.  2. Essential hypertension: Blood pressure control. Continue Norvasc.  3. GERD: Continue metoprolol.  4. Hypokalemia: Resolved, replete as needed   Management plans discussed with the patient and she is in agreement.  CODE STATUS: FULL  TOTAL TIME TAKING CARE OF THIS PATIENT: 30 minutes.     POSSIBLE D/C 2 days, DEPENDING ON CLINICAL CONDITION.   Kingjames Coury M.D on 11/26/2015 at 11:31 AM  Between 7am to 6pm - Pager - [phone] After 6pm go to [email or web address] - password [email or web address] [facility]  Office  [phone]  CC: Primary care physician; Adin Hector, MD  Note: This dictation was prepared with Dragon dictation along with smaller phrase technology. Any transcriptional errors that result from this process are unintentional.

## 2015-11-26 NOTE — Clinical Social Work Note (Signed)
Clinical Social Work Assessment  Patient Details  Name: April Rogers MRN: [id number] Date of Birth: 1941/02/06  Date of referral:  11/26/15               Reason for consult:  Facility Placement                Permission sought to share information with:  Chartered certified accountant granted to share information::  Yes, Verbal Permission Granted  Name::      [place]::     Relationship::     Contact Information:     Housing/Transportation Living arrangements for the past 2 months:  [place] of Information:  Patient, Other (Comment Required) (Significant other Doug ) Patient Interpreter Needed:  None Criminal Activity/Legal Involvement Pertinent to Current Situation/Hospitalization:  No - Comment as needed Significant Relationships:  Adult Children, Significant Other Lives with:  Significant Other Do you feel safe going back to the place where you live?  Yes Need for family participation in patient care:  Yes (Comment)  Care giving concerns:  Patient lives in [place] with her boyfriend [place].    Social Worker assessment / plan:  Holiday representative ([place]) reviewed chart and noted that patient is having surgery today. CSW met with patient and her boyfriend Marden Noble prior to surgery. CSW introduced self and explained role of CSW department. Patient was alert and oriented and was laying in the bed. 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 CSW explained that after surgery PT will evaluate patient and make a recommending about after care. Patient prefers to go home and her boyfriend Marden Noble can provide 24/7 supervision. CSW explained SNF option and that her Mcarthur Rossetti will have to approve SNF. Patient is agreeable to SN[facility] search [facility] however she prefers to go home.   FL2 complete and faxed out. CSW will follow up after PT evaluation.    Employment status:  Contractor PT Recommendations:  Not assessed at this time Information / Referral to community resources:  Council, Other (Comment Required) (Home Health VS. SNF )  Patient/Family's Response to care:  Patient prefers to return home.   Patient/Family's Understanding of and Emotional Response to Diagnosis, Current Treatment, and Prognosis:  Patient and Marden Noble were pleasant and thanked CSW for visit.   Emotional Assessment Appearance:  Appears stated age Attitude/Demeanor/Rapport:    Affect (typically observed):  Accepting, Adaptable, Pleasant Orientation:  Oriented to Self, Oriented to Place, Oriented to  Time, Oriented to Situation Alcohol / Substance use:  Not Applicable Psych involvement (Current and /or in the community):  No (Comment)  Discharge Needs  Concerns to be addressed:  Discharge Planning Concerns Readmission within the last 30 days:  No Current discharge risk:  Dependent with Mobility Barriers to Discharge:  Continued Medical Work up   Loralyn Freshwater, LCSW 11/26/2015, 11:49 AM

## 2015-11-26 NOTE — Progress Notes (Signed)
Pt. Alert and oriented. VSS. Pts. Had a great deal of pain in right hip throughout the night and hasn't had much sleep. Medicated per MAR. Pts. Pain has been controlled with pain meds. IS given to pt. And pt. Instructed on its use. Foley patent. NPO for surgery today. Ice pack to right hip. Pt. Resting comfortably at this time. Will continue to monitor.

## 2015-11-26 NOTE — H&P (Signed)
THE PATIENT WAS SEEN PRIOR TO SURGERY TODAY.  HISTORY, ALLERGIES, HOME MEDICATIONS AND OPERATIVE PROCEDURE WERE REVIEWED. RISKS AND BENEFITS OF SURGERY DISCUSSED WITH PATIENT AGAIN.  NO CHANGES FROM INITIAL HISTORY AND PHYSICAL NOTED.    

## 2015-11-26 NOTE — Consult Note (Signed)
Pt crcl is >68ml/min and weight is >52kg. Per protocol, dose of lovenox will be increased to 40mg  q 24 hours starting tomorrow AM.  Ramond Dial, Pharm.D Clinical Pharmacist

## 2015-11-26 NOTE — Clinical Social Work Placement (Signed)
   CLINICAL SOCIAL WORK PLACEMENT  NOTE  Date:  11/26/2015  Patient Details  Name: April Rogers MRN: [id number] Date of Birth: Dec 17, 1940  Clinical Social Work is seeking post-discharge placement for this patient at the [place] level of care (*CSW will initial, date and re-position this form in  chart as items are completed):  Yes   Patient/family provided with [place] Work Department's list of facilities offering this level of care within the geographic area requested by the patient (or if unable, by the patient's family).  Yes   Patient/family informed of their freedom to choose among providers that offer the needed level of care, that participate in Medicare, Medicaid or managed care program needed by the patient, have an available bed and are willing to accept the patient.  Yes   Patient/family informed of [facility]'s ownership interest in [facility] and [facility], as well as of the fact that they are under no obligation to receive care at these facilities.  PASRR submitted to EDS on 11/26/15     PASRR number received on 11/26/15     Existing PASRR number confirmed on       FL2 transmitted to all facilities in geographic area requested by pt/family on 11/26/15     FL2 transmitted to all facilities within larger geographic area on       Patient informed that his/her managed care company has contracts with or will negotiate with certain facilities, including the following:            Patient/family informed of bed offers received.  Patient chooses bed at       Physician recommends and patient chooses bed at      Patient to be transferred to   on  .  Patient to be transferred to facility by       Patient family notified on   of transfer.  Name of family member notified:        PHYSICIAN       Additional Comment:    _______________________________________________ Loralyn Freshwater, LCSW 11/26/2015, 11:47 AM

## 2015-11-26 NOTE — Anesthesia Preprocedure Evaluation (Signed)
Anesthesia Evaluation  Patient identified by MRN, date of birth, ID band Patient awake    Reviewed: Allergy & Precautions, H&P , NPO status , Patient's Chart, lab work & pertinent test results  History of Anesthesia Complications Negative for: history of anesthetic complications  Airway Mallampati: II  TM Distance: >3 FB Neck ROM: limited    Dental  (+) Poor Dentition, Chipped, Missing, Partial Lower, Upper Dentures   Pulmonary neg shortness of breath, COPD, Current Smoker,    Pulmonary exam normal breath sounds clear to auscultation       Cardiovascular Exercise Tolerance: Good hypertension, (-) angina(-) Past MI and (-) DOE Normal cardiovascular exam Rhythm:regular Rate:Normal     Neuro/Psych PSYCHIATRIC DISORDERS Anxiety Depression negative neurological ROS     GI/Hepatic Neg liver ROS, GERD  Controlled and Medicated,  Endo/Other  negative endocrine ROS  Renal/GU negative Renal ROS  negative genitourinary   Musculoskeletal  (+) Arthritis ,   Abdominal   Peds  Hematology negative hematology ROS (+)   Anesthesia Other Findings Past Medical History:   Anxiety                                                      Arthritis                                                    Depression                                                   Hypertension                                                 Cancer (HCC)                                                 Macular degeneration of both eyes                            Fracture of one rib                                          GERD (gastroesophageal reflux disease)                         Comment:was on nexium but no longer needs to take this               medication  Past Surgical History:   CHOLECYSTECTOMY  TONSILLECTOMY                                                 TUBAL LIGATION                                                 WRIST RECONSTRUCTION                            Right              ARM SKIN LESION BIOPSY / EXCISION               Right              COLONOSCOPY WITH PROPOFOL                       N/A 08/03/2015      Comment:Procedure: COLONOSCOPY WITH PROPOFOL;  Surgeon:              Hulen Luster, MD;  Location: [facility] ENDOSCOPY;                Service: Gastroenterology;  Laterality: N/A;  BMI    Body Mass Index   22.81 kg/m 2      Reproductive/Obstetrics negative OB ROS                             Anesthesia Physical Anesthesia Plan  ASA: III  Anesthesia Plan: General ETT   Post-op Pain Management:    Induction:   Airway Management Planned:   Additional Equipment:   Intra-op Plan:   Post-operative Plan:   Informed Consent: I have reviewed the patients History and Physical, chart, labs and discussed the procedure including the risks, benefits and alternatives for the proposed anesthesia with the patient or authorized representative who has indicated his/her understanding and acceptance.   Dental Advisory Given  Plan Discussed with: Anesthesiologist, CRNA and Surgeon  Anesthesia Plan Comments: (Patient requests general over spinal)        Anesthesia Quick Evaluation

## 2015-11-27 ENCOUNTER — Encounter: Payer: Self-pay | Admitting: Specialist

## 2015-11-27 ENCOUNTER — Encounter
Admission: RE | Admit: 2015-11-27 | Discharge: 2015-11-27 | Disposition: A | Discharge status: Home / Self Care | Payer: Commercial Managed Care - HMO | Source: Ambulatory Visit | Attending: Internal Medicine | Admitting: Internal Medicine

## 2015-11-27 DIAGNOSIS — D62 Acute posthemorrhagic anemia: Secondary | ICD-10-CM | POA: Insufficient documentation

## 2015-11-27 DIAGNOSIS — E876 Hypokalemia: Secondary | ICD-10-CM | POA: Insufficient documentation

## 2015-11-27 LAB — BASIC METABOLIC PANEL
Anion gap: 4 — ABNORMAL LOW (ref 5–15)
BUN: 7 mg/dL (ref 6–20)
CALCIUM: 8.2 mg/dL — AB (ref 8.9–10.3)
CO2: 28 mmol/L (ref 22–32)
CREATININE: 0.56 mg/dL (ref 0.44–1.00)
Chloride: 105 mmol/L (ref 101–111)
GFR calc non Af Amer: >60 mL/min (ref >60)
Glucose, Bld: 143 mg/dL — ABNORMAL HIGH (ref 65–99)
Potassium: 3.6 mmol/L (ref 3.5–5.1)
SODIUM: 137 mmol/L (ref 135–145)

## 2015-11-27 LAB — CBC
HEMATOCRIT: 28 % — AB (ref 35.0–47.0)
Hemoglobin: 9.6 g/dL — ABNORMAL LOW (ref 12.0–16.0)
MCH: 34.4 pg — ABNORMAL HIGH (ref 26.0–34.0)
MCHC: 34.3 g/dL (ref 32.0–36.0)
MCV: 100.3 fL — ABNORMAL HIGH (ref 80.0–100.0)
Platelets: 135 10*3/uL — ABNORMAL LOW (ref 150–440)
RBC: 2.79 MIL/uL — ABNORMAL LOW (ref 3.80–5.20)
RDW: 12.3 % (ref 11.5–14.5)
WBC: 7.9 10*3/uL (ref 3.6–11.0)

## 2015-11-27 MED ORDER — FERROUS SULFATE 325 (65 FE) MG PO TABS: 325.0000 mg | ORAL_TABLET | Freq: Every day | ORAL | Status: DC

## 2015-11-27 MED ORDER — HYDROCODONE-ACETAMINOPHEN 5-325 MG PO TABS: 1.0000 | ORAL_TABLET | Freq: Four times a day (QID) | ORAL | Status: DC | PRN

## 2015-11-27 MED ORDER — SENNA 8.6 MG PO TABS: 1.0000 | ORAL_TABLET | Freq: Two times a day (BID) | ORAL | Status: AC

## 2015-11-27 MED ORDER — ENOXAPARIN SODIUM 40 MG/0.4ML ~~LOC~~ SOLN: 40.0000 mg | SUBCUTANEOUS | Status: DC

## 2015-11-27 NOTE — Progress Notes (Signed)
Foley removed with 950cc urine output at 0510

## 2015-11-27 NOTE — Progress Notes (Signed)
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 Pt is now s/p R IM nailing. Pt is POD 1 at time of evaluation.    PT Comments    Pt with c/o increased pain this pm 2/2 return trf to bed.  Pt with increased anxiety to ambulate but able to perform with PTA encouragement.  Performed bed mobility with minA x1 with cues for sequencing and RLE placement.  Pt able to ambulate to recliner chair and return to bed however declined further distances 2/2 anxiety and pain.  Pt required add'l time to perform activities however was able to demonstrate with good technique and min cues.  She would benefit from skilled PT to increased functional activity tolerance and improved safety with functional mobility.    Follow Up Recommendations  SNF     Equipment Recommendations  Rolling walker with 5" wheels    Recommendations for Other Services       Precautions / Restrictions Precautions Precautions: Fall Restrictions Weight Bearing Restrictions: Yes RLE Weight Bearing: Partial weight bearing    Mobility  Bed Mobility Overal bed mobility: Needs Assistance Bed Mobility: Supine to Sit;Sit to Supine     Supine to sit: Min assist Sit to supine: Min assist   General bed mobility comments: Pt attempted use of belt/leg lifter to assist with sit to supine trf.  Required PTA assit to lift/posotion RLE on bed  Transfers Overall transfer level: Needs assistance Equipment used: Rolling walker (2 wheeled) Transfers: Sit to/from Stand Sit to Stand: Mod assist         General transfer comment: cues for PHP and LE placement to facilitate transfer  Ambulation/Gait Ambulation/Gait assistance: Min assist Ambulation Distance (Feet): 6 Feet Assistive device: Rolling walker (2 wheeled) Gait Pattern/deviations: Step-to pattern     General  Gait Details: small shuffling steps ambulated to chair and returned to bed   Stairs            Wheelchair Mobility    Modified Rankin (Stroke Patients Only)       Balance Overall balance assessment: Needs assistance Sitting-balance support: Bilateral upper extremity supported Sitting balance-Leahy Scale: Fair     Standing balance support: Bilateral upper extremity supported Standing balance-Leahy Scale: Fair                      Cognition Arousal/Alertness: Awake/alert Behavior During Therapy: WFL for tasks assessed/performed Overall Cognitive Status: Within Functional Limits for tasks assessed                      Exercises      General Comments        Pertinent Vitals/Pain Pain Assessment: 0-10 Pain Score: 7  Pain Location: R hip Pain Descriptors / Indicators: Operative site guarding Pain Intervention(s): Limited activity within patient's tolerance;Monitored during session    Home Living                      Prior Function            PT Goals (current goals can now be found in the care plan section) Acute Rehab PT Goals Patient Stated Goal: to get stronger PT Goal Formulation: With patient Time For Goal Achievement: 12/11/15 Potential to Achieve Goals: Good    Frequency  BID    PT Plan  Co-evaluation             End of Session Equipment Utilized During Treatment: Gait belt Activity Tolerance: Patient limited by pain Patient left: in bed;with bed alarm set;with family/visitor present     Time: 1530-1405 PT Time Calculation (min) (ACUTE ONLY): 1355 min  Charges:  $Therapeutic Activity: 23-37 mins                    G Codes:      Janett Kamath 2015-11-28, 4:24 PM Lennie Dunnigan, PTA

## 2015-11-27 NOTE — Consult Note (Signed)
   [facility] CM Inpatient Consult   11/27/2015  April Rogers Feb 18, 1941 [id number]   Patient screened for potential [place] Management services. Patient is eligible for [place]. Electronic medical record reveals patient's discharge plan is SNF. [facility] Care Management services not appropriate at this time. If patient's post hospital needs change please place a [facility] Care Management consult. For questions please contact:   Tobey Lippard RN, [facility] Liaison  [phone]) Business Mobile [phone]) Toll free office

## 2015-11-27 NOTE — Evaluation (Signed)
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 Pt is now s/p R IM nailing. Pt is POD 1 at time of evaluation.  Clinical Impression  Pt is a pleasant 75 year old female who was admitted for R hip fracture and is now s/p R IM nailing. Pt performs bed mobility with min assist, transfers with mod assist, and ambulation with min assist and rw. Pt demonstrates deficits with strength/mobility/pain. Pt requires heavy cues for correct WB status. Pt is far removed from baseline functioning. Would benefit from skilled PT to address above deficits and promote optimal return to PLOF. Recommend transition to STR upon discharge from acute hospitalization.       Follow Up Recommendations SNF    Equipment Recommendations  Rolling walker with 5" wheels    Recommendations for Other Services       Precautions / Restrictions Precautions Precautions: Fall Restrictions Weight Bearing Restrictions: Yes RLE Weight Bearing: Partial weight bearing      Mobility  Bed Mobility Overal bed mobility: Needs Assistance Bed Mobility: Supine to Sit     Supine to sit: Min assist     General bed mobility comments: assist for sequencing and pt able to follow commands. Once seated, pt able to sit with supervision. Pt with heavy guarding noted with leaning towards L side  Transfers Overall transfer level: Needs assistance Equipment used: Rolling walker (2 wheeled) Transfers: Sit to/from Stand Sit to Stand: Mod assist         General transfer comment: assist for pushing from seated surface. Once standing, pt able to stand with min assist. Pt educated on correct WB status.  Ambulation/Gait Ambulation/Gait assistance: Min assist Ambulation Distance (Feet): 3 Feet Assistive device: Rolling walker (2 wheeled) Gait Pattern/deviations: Step-to  pattern     General Gait Details: ambulated with small step to gait pattern with min assist for advancing R LE secondary to weakness/pain. Pt requires heavy cues for maintaining WB precautions. Pt fatigues quickly with limited ambulation  Stairs            Wheelchair Mobility    Modified Rankin (Stroke Patients Only)       Balance Overall balance assessment: Needs assistance;History of Falls Sitting-balance support: Bilateral upper extremity supported Sitting balance-Leahy Scale: Fair     Standing balance support: Bilateral upper extremity supported Standing balance-Leahy Scale: Fair                               Pertinent Vitals/Pain Pain Assessment: 0-10 Pain Score: 4  Pain Location: R hip Pain Descriptors / Indicators: Operative site guarding Pain Intervention(s): Limited activity within patient's tolerance;Premedicated before session    Home Living Family/patient expects to be discharged to:: Private residence Living Arrangements:  (boyfriend) Available Help at Discharge: Family;Available 24 hours/day Type of Home:  (condo) Home Access: Stairs to enter Entrance Stairs-Rails: None Entrance Stairs-Number of Steps: 1 "small" step to enter-pt reports step is 2" Home Layout: One level Home Equipment: Bedside commode Additional Comments: may have RW, however pt is unsure    Prior Function Level of Independence: Independent               Hand Dominance        Extremity/Trunk Assessment   Upper Extremity Assessment: Overall WFL for tasks assessed  Lower Extremity Assessment: Generalized weakness (R LE grossly 3/5; L LE grossly 5/5)         Communication   Communication: No difficulties  Cognition Arousal/Alertness: Awake/alert Behavior During Therapy: WFL for tasks assessed/performed Overall Cognitive Status: Within Functional Limits for tasks assessed                      General Comments      Exercises  Other Exercises Other Exercises: Pt performed B LE ther-ex while supine including ankle pumps, quad set, glut set, SLRs, and hip ab/add. All ther-ex performed x 10 reps with min assist and cues for correct technique.      Assessment/Plan    PT Assessment Patient needs continued PT services  PT Diagnosis Difficulty walking;Abnormality of gait;Generalized weakness;Acute pain   PT Problem List Decreased strength;Decreased activity tolerance;Decreased mobility;Pain  PT Treatment Interventions DME instruction;Gait training;Therapeutic exercise   PT Goals (Current goals can be found in the Care Plan section) Acute Rehab PT Goals Patient Stated Goal: to get stronger PT Goal Formulation: With patient Time For Goal Achievement: 12/11/15 Potential to Achieve Goals: Good    Frequency BID   Barriers to discharge        Co-evaluation               End of Session Equipment Utilized During Treatment: Gait belt Activity Tolerance: Patient limited by pain Patient left: in chair;with chair alarm set Nurse Communication: Mobility status;Weight bearing status         Time: [id number] PT Time Calculation (min) (ACUTE ONLY): 33 min   Charges:   PT Evaluation $PT Eval Moderate Complexity: 1 Procedure PT Treatments $Therapeutic Exercise: 8-22 mins   PT G Codes:        Lilybeth Vien 12-17-2015, 11:52 AM  Greggory Stallion, PT, DPT [phone]

## 2015-11-27 NOTE — Progress Notes (Signed)
Subjective: 1 Day Post-Op Procedure(s) (LRB): INTRAMEDULLARY (IM) NAIL INTERTROCHANTRIC (Right)    Patient reports pain as mild. Awake and alert.  Pain improved form pre op.  Dressing dry  Objective:   VITALS:   Filed Vitals:   11/27/15 0400 11/27/15 0835  BP: 113/50 107/41  Pulse: 77 73  Temp: 97.8 F (36.6 C) 98.1 F (36.7 C)  Resp: 16 16    Neurologically intact ABD soft Neurovascular intact Sensation intact distally Intact pulses distally Dorsiflexion/Plantar flexion intact No cellulitis present  LABS  Recent Labs  11/25/15 1530 11/26/15 0408 11/27/15 0515  HGB 13.0 11.9* 9.6*  HCT 37.7 34.0* 28.0*  WBC 7.0 8.9 7.9  PLT 193 172 135*     Recent Labs  11/25/15 1530 11/26/15 0408 11/27/15 0515  NA 138 135 137  K 3.3* 3.7 3.6  BUN 20 14 7   CREATININE 0.66 0.49 0.56  GLUCOSE 105* 117* 143*     Recent Labs  11/25/15 1925  INR 1.06     Assessment/Plan: 1 Day Post-Op Procedure(s) (LRB): INTRAMEDULLARY (IM) NAIL INTERTROCHANTRIC (Right)   Advance diet Up with therapy D/C IV fluids Discharge to SNF

## 2015-11-27 NOTE — Evaluation (Signed)
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 Pt is now s/p R IM nailing. Patient reports she has had frequent falls over the last years.    Clinical Impression   Patient was seen for OT evaluation this date.  Patient is s/p fall and underwent surgery for right IM nailing.  She is now [facility] on the right LE.  She presents with decreased LB ROM, pain, decreased balance, decreased self care tasks, decreased functional transfers and mobility.  She lives with significant other who can assist with care once she returns home but she will likely require short term rehab to increase independence to be able to return home.  She will benefit from skilled OT to maximize her safety and independence in daily tasks.      Follow Up Recommendations  SNF    Equipment Recommendations       Recommendations for Other Services       Precautions / Restrictions Precautions Precautions: Fall Restrictions Weight Bearing Restrictions: Yes RLE Weight Bearing: Partial weight bearing      Mobility Bed Mobility Per PT/OT Overal bed mobility: Needs Assistance Bed Mobility: Supine to Sit;Sit to Supine     Supine to sit: Min assist Sit to supine: Min assist   General bed mobility comments: Pt attempted use of belt/leg lifter to assist with sit to supine trf.  Required PTA assit to lift/posotion RLE on bed  Transfers Overall transfer level: Needs assistance Equipment used: Rolling walker (2 wheeled) Transfers: Sit to/from Stand Sit to Stand: Mod assist         General transfer comment: cues for PHP and LE placement to facilitate transfer    Balance Overall balance assessment: Needs assistance Sitting-balance support: Bilateral upper extremity supported Sitting balance-Leahy Scale: Fair     Standing balance support: Bilateral  upper extremity supported Standing balance-Leahy Scale: Fair                              ADL Overall ADL's : Needs assistance/impaired Eating/Feeding: Set up   Grooming: Set up   Upper Body Bathing: Set up   Lower Body Bathing: Set up;Moderate assistance   Upper Body Dressing : Set up   Lower Body Dressing: Set up;Maximal assistance Lower Body Dressing Details (indicate cue type and reason): Pain with movement   Toilet Transfer Details (indicate cue type and reason): will assess next date           General ADL Comments: Patient declined toileting this pm and complaining of pain, will assess next session.     Vision     Perception     Praxis      Pertinent Vitals/Pain Pain Assessment: 0-10 Pain Score: 5  Pain Location: right hip Pain Descriptors / Indicators: Operative site guarding Pain Intervention(s): Limited activity within patient's tolerance;Repositioned;Monitored during session;Premedicated before session     Hand Dominance Right   Extremity/Trunk Assessment Upper Extremity Assessment Upper Extremity Assessment: Overall WFL for tasks assessed   Lower Extremity Assessment Lower Extremity Assessment: Defer to PT evaluation       Communication Communication Communication: No difficulties   Cognition Arousal/Alertness: Awake/alert Behavior During Therapy: WFL for tasks assessed/performed Overall Cognitive Status: Within Functional Limits for tasks assessed  General Comments       Exercises       Shoulder Instructions      Home Living Family/patient expects to be discharged to:: Private residence Living Arrangements: Spouse/significant other Available Help at Discharge: Family;Available 24 hours/day Type of Home: Other(Comment) (condo) Home Access: Stairs to enter   Entrance Stairs-Rails: None Home Layout: One level     Bathroom Shower/Tub: Walk-in shower;Door   [employer] Toilet:  Standard Bathroom Accessibility: Yes   Home Equipment: Bedside commode;Grab bars - tub/shower;Shower seat   Additional Comments: may have RW, however pt is unsure      Prior Functioning/Environment Level of Independence: Independent             OT Diagnosis: Generalized weakness;Acute pain (decreased self care tasks.)   OT Problem List: Decreased strength;Impaired balance (sitting and/or standing);Pain;Decreased range of motion;Decreased activity tolerance;Decreased knowledge of use of DME or AE   OT Treatment/Interventions: Self-care/ADL training;Therapeutic exercise;Patient/family education;Balance training;Therapeutic activities;DME and/or AE instruction    OT Goals(Current goals can be found in the care plan section) Acute Rehab OT Goals Patient Stated Goal: to be able to move around without pain, be as independent as possible. OT Goal Formulation: With patient Time For Goal Achievement: 12/11/15 Potential to Achieve Goals: Good  OT Frequency: Min 1X/week   Barriers to D/C:            Co-evaluation              End of Session    Activity Tolerance: Patient limited by pain Patient left: in bed;with call bell/phone within reach;with bed alarm set   Time: [id number] OT Time Calculation (min): 26 min Charges:  OT General Charges $OT Visit: 1 Procedure OT Evaluation $OT Eval Low Complexity: 1 Procedure OT Treatments $Self Care/Home Management : 8-22 mins G-Codes:    Arvell Pulsifer T Win Guajardo, OTR/L, CLT  Micheil Klaus 11/27/2015, 4:36 PM

## 2015-11-27 NOTE — Progress Notes (Addendum)
PT is recommending SNF. Clinical Education officer, museum (CSW) met with patient and her boyfriend and presented bed offers. Patient chose [facility]. Patient's daughter [name] was also on speaker phone.  Plan is for patient to D/C to [facility] over the weekend pending medical clearance. Per Kim admissions coordinator at [facility] patient will go to room 210-A. RN will call report at [phone]. CSW sent D/C Summary, FL2 and D/C Packet to [employer] via [place] today. Amy [facility] case manager is aware of above. [facility] authorization has been received. Auth # [id number]. Patient is aware of above.   Blima Rich, LCSW [phone]

## 2015-11-27 NOTE — Anesthesia Postprocedure Evaluation (Signed)
Anesthesia Post Note  Patient: April Rogers  Procedure(s) Performed: Procedure(s) (LRB): INTRAMEDULLARY (IM) NAIL INTERTROCHANTRIC (Right)  Patient location during evaluation: PACU Anesthesia Type: General Level of consciousness: awake and alert Pain management: pain level controlled Vital Signs Assessment: post-procedure vital signs reviewed and stable Respiratory status: spontaneous breathing, nonlabored ventilation, respiratory function stable and patient connected to nasal cannula oxygen Cardiovascular status: blood pressure returned to baseline and stable Postop Assessment: no signs of nausea or vomiting Anesthetic complications: no    Last Vitals:  Filed Vitals:   11/26/15 2117 11/27/15 0400  BP: 114/52 113/50  Pulse: 86 77  Temp: 36.6 C 36.6 C  Resp: 18 16    Last Pain:  Filed Vitals:   11/27/15 [id number]  PainSc: 5                  Precious Haws Piscitello

## 2015-11-27 NOTE — Progress Notes (Signed)
OT Cancellation Note  Patient Details Name: April Rogers MRN: [id number] DOB: February 06, 1941   Cancelled Treatment:     Attempted to see patient at 1145, she is in pain, nurse just assisted her back to bed, will have to reattempt later in the day. Amy T Lovett, OTR/L, CLT  Lovett,Amy 11/27/2015, 11:50 AM

## 2015-11-27 NOTE — Discharge Summary (Signed)
[place] [facility] [place] NAME: April Rogers    MR#:  [id number]  DATE OF BIRTH:  1941-05-10  DATE OF ADMISSION:  11/25/2015 ADMITTING PHYSICIAN: Earnestine Leys, MD  DATE OF DISCHARGE: No discharge date for patient encounter.  PRIMARY CARE PHYSICIAN: Tama High III, MD   ADMISSION DIAGNOSIS:  Closed fracture of neck of right femur, initial encounter ([place]) [S72.001A]  DISCHARGE DIAGNOSIS:  Active Problems:   Femur fracture, right, closed, initial encounter   SECONDARY DIAGNOSIS:   Past Medical History  Diagnosis Date  . Anxiety   . Arthritis   . Depression   . Hypertension   . Cancer ([place])   . Macular degeneration of both eyes   . Fracture of one rib   . GERD (gastroesophageal reflux disease)     was on nexium but no longer needs to take this medication     ADMITTING HISTORY  April Rogers is a 75 y.o. female with a known history of Essential hypertension, GERD without esophagitis who is presenting after mechanical fall. Patient fell in a parking lot on cement, traumatic injury to left hip noticed immediate pain at that site. Nonradiating pain 10/10 "excruciating" worse with movement no relieving factors no further symptoms  HOSPITAL COURSE:   75 year old female with essential hypertension and esophagitis status post mechanical fall and unfortunately has suffered right hip fracture.  1. Intertrochanteric fracture of right hip fracture status post mechanical fall Postop recovery is going well Appreciate orthopedic help. Pain well controlled. DVT prophylaxis with Lovenox Physical therapy to continue Skilled nursing facility at discharge.  * Acute blood loss anemia status post surgery Monitor. No need of transfusion at this point.  2. Essential hypertension Continue Norvasc  3. GERD:   4. Hypokalemia: Resolved, replete as needed    CONSULTS OBTAINED:  Treatment Team:  Lytle Butte, MD  DRUG ALLERGIES:    Allergies  Allergen Reactions  . Penicillins Hives  . Actonel [Risedronate]     Does not remember reaction  . Alendronate     Generic medicine .... Unsure of reaction  . Codeine     hallucinations  . Meloxicam     Unsure of reaction  . Percocet [Oxycodone-Acetaminophen]     hallucinations  . Varenicline     Unsure of medication or reaction  . Wellbutrin [Bupropion]     nightmares  . Wine [Alcohol] Hives    DISCHARGE MEDICATIONS:   Current Discharge Medication List    START taking these medications   Details  enoxaparin (LOVENOX) 40 MG/0.4ML injection Inject 0.4 mLs (40 mg total) into the skin daily. Qty: 12 Syringe, Refills: 0    ferrous sulfate 325 (65 FE) MG tablet Take 1 tablet (325 mg total) by mouth daily with breakfast. Qty: 30 tablet, Refills: 0    HYDROcodone-acetaminophen (NORCO/VICODIN) 5-325 MG tablet Take 1-2 tablets by mouth every 6 (six) hours as needed for moderate pain. Qty: 30 tablet, Refills: 0    senna (SENOKOT) 8.6 MG TABS tablet Take 1 tablet (8.6 mg total) by mouth 2 (two) times daily. Qty: 120 each, Refills: 0      CONTINUE these medications which have NOT CHANGED   Details  amLODipine (NORVASC) 5 MG tablet Take 5 mg by mouth daily.    aspirin (ASPIRIN EC) 81 MG EC tablet Take 81 mg by mouth daily. Swallow whole.    b complex vitamins tablet Take 1 tablet by mouth daily.    Cholecalciferol 10000  units TABS Take 1,000 Units by mouth daily.    esomeprazole (NEXIUM) 20 MG capsule Take 20 mg by mouth daily at 12 noon.    ibuprofen (ADVIL,MOTRIN) 200 MG tablet Take 200 mg by mouth daily.    Multiple Vitamins-Minerals (ANTIOXIDANT FORMULA SG) capsule Take 2 capsules by mouth daily.    Multiple Vitamins-Minerals (CENTRUM SILVER PO) Take 1 tablet by mouth daily.    naproxen sodium (ANAPROX) 220 MG tablet Take 220 mg by mouth 2 (two) times daily with a meal. Reported on 08/03/2015    omega-3 acid ethyl esters (LOVAZA) 1 g capsule Take 1 g by  mouth daily.    venlafaxine XR (EFFEXOR-XR) 150 MG 24 hr capsule Take 150 mg by mouth daily with breakfast.        Today   VITAL SIGNS:  Blood pressure 107/41, pulse 73, temperature 98.1 F (36.7 C), temperature source Oral, resp. rate 16, height 5\' 4"  (1.626 m), weight 60.328 kg (133 lb), SpO2 93 %.  I/O:   Intake/Output Summary (Last 24 hours) at 11/27/15 1052 Last data filed at 11/27/15 0511  Gross per 24 hour  Intake   1600 ml  Output   2250 ml  Net   -650 ml    PHYSICAL EXAMINATION:  Physical Exam  GENERAL:  75 y.o.-year-old patient lying in the bed with no acute distress.  LUNGS: Normal breath sounds bilaterally, no wheezing, rales,rhonchi or crepitation. No use of accessory muscles of respiration.  CARDIOVASCULAR: S1, S2 normal. No murmurs, rubs, or gallops.  ABDOMEN: Soft, non-tender, non-distended. Bowel sounds present. No organomegaly or mass.  PSYCHIATRIC: The patient is alert and oriented x 3.  SKIN: No obvious rash, lesion, or ulcer.  Right hip dressing  DATA REVIEW:   CBC  Recent Labs Lab 11/27/15 0515  WBC 7.9  HGB 9.6*  HCT 28.0*  PLT 135*    Chemistries   Recent Labs Lab 11/25/15 1530  11/27/15 0515  NA 138  < > 137  K 3.3*  < > 3.6  CL 101  < > 105  CO2 27  < > 28  GLUCOSE 105*  < > 143*  BUN 20  < > 7  CREATININE 0.66  < > 0.56  CALCIUM 9.1  < > 8.2*  AST 30  --   --   ALT 31  --   --   ALKPHOS 74  --   --   BILITOT 0.7  --   --   < > = values in this interval not displayed.  Cardiac Enzymes No results for input(s): TROPONINI in the last 168 hours.  Microbiology Results  Results for orders placed or performed during the hospital encounter of 11/25/15  Surgical PCR screen     Status: None   Collection Time: 11/25/15 10:00 PM  Result Value Ref Range Status   MRSA, PCR NEGATIVE NEGATIVE Final   Staphylococcus aureus NEGATIVE NEGATIVE Final    Comment:        The Xpert SA Assay (FDA approved for NASAL specimens in patients  over 52 years of age), is one component of a comprehensive surveillance program.  Test performance has been validated by [facility] for patients greater than or equal to 24 year old. It is not intended to diagnose infection nor to guide or monitor treatment.     RADIOLOGY:  Dg Chest 1 View  11/25/2015  CLINICAL DATA:  Right hip pain and deformity after falling today. Initial encounter. EXAM: CHEST 1 VIEW  COMPARISON:  Radiographs 03/08/2008. FINDINGS: 1549 hours. The heart size is stable. There is progressive widening of superior mediastinum, probably due to vascular ectasia, lower lung volumes and AP technique. There is increased interstitial prominence throughout the lungs without overt pulmonary edema, confluent airspace opacity, pleural effusion or pneumothorax. No acute osseous findings are seen. IMPRESSION: No definite acute chest findings. There are lower lung volumes with resulting increased prominence of the superior mediastinum and interstitial markings. Consider follow-up erect 2 view examination after treatment of patient's hip fracture. Electronically Signed   By: Richardean Sale M.D.   On: 11/25/2015 16:27   Dg Hip Operative Unilat W Or W/o Pelvis Right  11/26/2015  CLINICAL DATA:  75 year old female undergoing ORIF right femur intertrochanteric fracture. Initial encounter. EXAM: OPERATIVE RIGHT HIP (WITH PELVIS IF PERFORMED) 4 VIEWS TECHNIQUE: Fluoroscopic spot image(s) were submitted for interpretation post-operatively. COMPARISON:  11/25/2015 FINDINGS: 4 intraoperative fluoroscopic views of the proximal right femur demonstrate improved fracture alignment on the first 2 images, appearing near anatomic. The second 2 images demonstrate a proximal right femur intra medullary rod with interlocking proximal hip screw. FLUOROSCOPY TIME:  0 minutes 31 seconds IMPRESSION: ORIF right proximal femur with no adverse features. Electronically Signed   By: Genevie Ann M.D.   On: 11/26/2015 13:41    Dg Hip Unilat With Pelvis 2-3 Views Right  11/25/2015  CLINICAL DATA:  Fall with right hip pain.  Initial encounter. EXAM: DG HIP (WITH OR WITHOUT PELVIS) 2-3V RIGHT COMPARISON:  None. FINDINGS: Intertrochanteric right femur fracture with displacement and varus angulation. Rotated pelvis with no evidence of fracture or diastasis. Located hips. Osteopenia. IMPRESSION: Displaced intertrochanteric right femur fracture. Electronically Signed   By: Monte Fantasia M.D.   On: 11/25/2015 16:21    Follow up with PCP in 1 week.  Management plans discussed with the patient, family and they are in agreement.  CODE STATUS:  Code Status History    Date Active Date Inactive Code Status Order ID Comments User Context   11/25/2015  6:06 PM 11/26/2015  2:28 PM Full Code [id number]  Earnestine Leys, MD ED   11/25/2015  4:50 PM 11/25/2015  6:06 PM Full Code [id number]  Lytle Butte, MD ED    Advance Directive Documentation        Most Recent Value   Type of Advance Directive  Healthcare Power of Attorney   Pre-existing out of facility DNR order (yellow form or pink MOST form)     "MOST" Form in Place?        TOTAL TIME TAKING CARE OF THIS PATIENT ON DAY OF DISCHARGE: more than 30 minutes.   Hillary Bow R M.D on 11/27/2015 at 10:52 AM  Between 7am to 6pm - Pager - [phone]  After 6pm go to [email or web address] - password [email or web address] [place]  Office  [phone]  CC: Primary care physician; Adin Hector, MD  Note: This dictation was prepared with Dragon dictation along with smaller phrase technology. Any transcriptional errors that result from this process are unintentional.

## 2015-11-27 NOTE — Progress Notes (Signed)
[place] [facility] [place] NAME: April Rogers    MR#:  [id number]  DATE OF BIRTH:  October 28, 1940  SUBJECTIVE:    Pain is better controlled with pain medications.  Postop day 1 No shortness of breath or abdominal pain. Foley catheter taken out. Physical therapy to work with patient later.  REVIEW OF SYSTEMS:    Review of Systems  Constitutional: Negative for fever, chills and malaise/fatigue.  HENT: Negative for ear discharge, ear pain, hearing loss, nosebleeds and sore throat.   Eyes: Negative for blurred vision and pain.  Respiratory: Negative for cough, hemoptysis, shortness of breath and wheezing.   Cardiovascular: Negative for chest pain, palpitations and leg swelling.  Gastrointestinal: Negative for nausea, vomiting, abdominal pain, diarrhea and blood in stool.  Genitourinary: Negative for dysuria.  Musculoskeletal: Positive for joint pain. Negative for back pain.  Neurological: Negative for dizziness, tremors, speech change, focal weakness, seizures and headaches.  Endo/Heme/Allergies: Does not bruise/bleed easily.  Psychiatric/Behavioral: Negative for depression, suicidal ideas and hallucinations.   Tolerating Diet:Nothing by mouth  DRUG ALLERGIES:   Allergies  Allergen Reactions  . Penicillins Hives  . Actonel [Risedronate]     Does not remember reaction  . Alendronate     Generic medicine .... Unsure of reaction  . Codeine     hallucinations  . Meloxicam     Unsure of reaction  . Percocet [Oxycodone-Acetaminophen]     hallucinations  . Varenicline     Unsure of medication or reaction  . Wellbutrin [Bupropion]     nightmares  . Wine [Alcohol] Hives    VITALS:  Blood pressure 107/41, pulse 73, temperature 98.1 F (36.7 C), temperature source Oral, resp. rate 16, height 5\' 4"  (1.626 m), weight 60.328 kg (133 lb), SpO2 93 %.  PHYSICAL EXAMINATION:   Physical Exam  Constitutional: She is oriented to person, place, and time  and well-developed, well-nourished, and in no distress.  HENT:  Head: Normocephalic and atraumatic.  Eyes: Conjunctivae are normal.  Neck: Normal range of motion. Neck supple. No JVD present. No thyromegaly present.  Cardiovascular: Normal rate and normal heart sounds.   No murmur heard. Pulmonary/Chest: Effort normal and breath sounds normal. She has no wheezes.  Abdominal: Soft. Bowel sounds are normal. There is no tenderness. There is no guarding.  Musculoskeletal: She exhibits no edema.  Neurological: She is alert and oriented to person, place, and time. She exhibits normal muscle tone.  Skin: Skin is warm and dry.  Psychiatric: Affect and judgment normal.   Right hip dressing  LABORATORY PANEL:   CBC  Recent Labs Lab 11/27/15 0515  WBC 7.9  HGB 9.6*  HCT 28.0*  PLT 135*   ------------------------------------------------------------------------------------------------------------------  Chemistries   Recent Labs Lab 11/25/15 1530  11/27/15 0515  NA 138  < > 137  K 3.3*  < > 3.6  CL 101  < > 105  CO2 27  < > 28  GLUCOSE 105*  < > 143*  BUN 20  < > 7  CREATININE 0.66  < > 0.56  CALCIUM 9.1  < > 8.2*  AST 30  --   --   ALT 31  --   --   ALKPHOS 74  --   --   BILITOT 0.7  --   --   < > = values in this interval not displayed. ------------------------------------------------------------------------------------------------------------------  Cardiac Enzymes No results for input(s): TROPONINI in the last 168 hours. ------------------------------------------------------------------------------------------------------------------  RADIOLOGY:  Dg Chest 1 View  11/25/2015  CLINICAL DATA:  Right hip pain and deformity after falling today. Initial encounter. EXAM: CHEST 1 VIEW COMPARISON:  Radiographs 03/08/2008. FINDINGS: 1549 hours. The heart size is stable. There is progressive widening of superior mediastinum, probably due to vascular ectasia, lower lung volumes  and AP technique. There is increased interstitial prominence throughout the lungs without overt pulmonary edema, confluent airspace opacity, pleural effusion or pneumothorax. No acute osseous findings are seen. IMPRESSION: No definite acute chest findings. There are lower lung volumes with resulting increased prominence of the superior mediastinum and interstitial markings. Consider follow-up erect 2 view examination after treatment of patient's hip fracture. Electronically Signed   By: Richardean Sale M.D.   On: 11/25/2015 16:27   Dg Hip Operative Unilat W Or W/o Pelvis Right  11/26/2015  CLINICAL DATA:  75 year old female undergoing ORIF right femur intertrochanteric fracture. Initial encounter. EXAM: OPERATIVE RIGHT HIP (WITH PELVIS IF PERFORMED) 4 VIEWS TECHNIQUE: Fluoroscopic spot image(s) were submitted for interpretation post-operatively. COMPARISON:  11/25/2015 FINDINGS: 4 intraoperative fluoroscopic views of the proximal right femur demonstrate improved fracture alignment on the first 2 images, appearing near anatomic. The second 2 images demonstrate a proximal right femur intra medullary rod with interlocking proximal hip screw. FLUOROSCOPY TIME:  0 minutes 31 seconds IMPRESSION: ORIF right proximal femur with no adverse features. Electronically Signed   By: Genevie Ann M.D.   On: 11/26/2015 13:41   Dg Hip Unilat With Pelvis 2-3 Views Right  11/25/2015  CLINICAL DATA:  Fall with right hip pain.  Initial encounter. EXAM: DG HIP (WITH OR WITHOUT PELVIS) 2-3V RIGHT COMPARISON:  None. FINDINGS: Intertrochanteric right femur fracture with displacement and varus angulation. Rotated pelvis with no evidence of fracture or diastasis. Located hips. Osteopenia. IMPRESSION: Displaced intertrochanteric right femur fracture. Electronically Signed   By: Monte Fantasia M.D.   On: 11/25/2015 16:21     ASSESSMENT AND PLAN:   75 year old female with essential hypertension and esophagitis status post mechanical fall  and unfortunately has suffered right hip fracture.  1. Intertrochanteric fracture of right hip fracture status post mechanical fall Postop day 1. Appreciate orthopedic help. Pain well controlled. DVT prophylaxis Physical therapy to work later. Skilled nursing facility at discharge.  * Acute blood loss anemia status post surgery Monitor. No need of transfusion at this point.  2. Essential hypertension: Blood pressure low normal. Norvasc held today.  3. GERD:   4. Hypokalemia: Resolved, replete as needed  Management plans discussed with the patient and she is in agreement.  CODE STATUS: FULL  TOTAL TIME TAKING CARE OF THIS PATIENT: 40 minutes.   POSSIBLE D/C 1-2 days, DEPENDING ON CLINICAL CONDITION.   Hillary Bow R M.D on 11/27/2015 at 10:49 AM  Between 7am to 6pm - Pager - [phone]   After 6pm go to [email or web address] - password [email or web address] [place] [facility]  Office  [phone]  CC: Primary care physician; Adin Hector, MD  Note: This dictation was prepared with Dragon dictation along with smaller phrase technology. Any transcriptional errors that result from this process are unintentional.

## 2015-11-28 DIAGNOSIS — S72001A Fracture of unspecified part of neck of right femur, initial encounter for closed fracture: Secondary | ICD-10-CM | POA: Diagnosis not present

## 2015-11-28 DIAGNOSIS — S72141D Displaced intertrochanteric fracture of right femur, subsequent encounter for closed fracture with routine healing: Secondary | ICD-10-CM | POA: Diagnosis not present

## 2015-11-28 DIAGNOSIS — M810 Age-related osteoporosis without current pathological fracture: Secondary | ICD-10-CM | POA: Diagnosis not present

## 2015-11-28 DIAGNOSIS — E876 Hypokalemia: Secondary | ICD-10-CM | POA: Diagnosis not present

## 2015-11-28 DIAGNOSIS — Z7409 Other reduced mobility: Secondary | ICD-10-CM | POA: Diagnosis not present

## 2015-11-28 DIAGNOSIS — K219 Gastro-esophageal reflux disease without esophagitis: Secondary | ICD-10-CM | POA: Diagnosis not present

## 2015-11-28 DIAGNOSIS — D62 Acute posthemorrhagic anemia: Secondary | ICD-10-CM | POA: Diagnosis not present

## 2015-11-28 DIAGNOSIS — Z9181 History of falling: Secondary | ICD-10-CM | POA: Diagnosis not present

## 2015-11-28 DIAGNOSIS — I10 Essential (primary) hypertension: Secondary | ICD-10-CM | POA: Diagnosis not present

## 2015-11-28 DIAGNOSIS — M6281 Muscle weakness (generalized): Secondary | ICD-10-CM | POA: Diagnosis not present

## 2015-11-28 DIAGNOSIS — K5900 Constipation, unspecified: Secondary | ICD-10-CM | POA: Diagnosis not present

## 2015-11-28 DIAGNOSIS — S8991XA Unspecified injury of right lower leg, initial encounter: Secondary | ICD-10-CM | POA: Diagnosis not present

## 2015-11-28 DIAGNOSIS — R262 Difficulty in walking, not elsewhere classified: Secondary | ICD-10-CM | POA: Diagnosis not present

## 2015-11-28 DIAGNOSIS — F329 Major depressive disorder, single episode, unspecified: Secondary | ICD-10-CM | POA: Diagnosis not present

## 2015-11-28 DIAGNOSIS — D649 Anemia, unspecified: Secondary | ICD-10-CM | POA: Diagnosis not present

## 2015-11-28 DIAGNOSIS — H3530 Unspecified macular degeneration: Secondary | ICD-10-CM | POA: Diagnosis not present

## 2015-11-28 LAB — CBC
HCT: 25.4 % — ABNORMAL LOW (ref 35.0–47.0)
Hemoglobin: 8.7 g/dL — ABNORMAL LOW (ref 12.0–16.0)
MCH: 33.8 pg (ref 26.0–34.0)
MCHC: 34.2 g/dL (ref 32.0–36.0)
MCV: 98.8 fL (ref 80.0–100.0)
PLATELETS: 125 10*3/uL — AB (ref 150–440)
RBC: 2.57 MIL/uL — AB (ref 3.80–5.20)
RDW: 12.3 % (ref 11.5–14.5)
WBC: 6.6 10*3/uL (ref 3.6–11.0)

## 2015-11-28 LAB — BASIC METABOLIC PANEL
Anion gap: 4 — ABNORMAL LOW (ref 5–15)
BUN: 8 mg/dL (ref 6–20)
CALCIUM: 8.2 mg/dL — AB (ref 8.9–10.3)
CO2: 29 mmol/L (ref 22–32)
CREATININE: 0.46 mg/dL (ref 0.44–1.00)
Chloride: 107 mmol/L (ref 101–111)
GFR calc Af Amer: >60 mL/min (ref >60)
GLUCOSE: 124 mg/dL — AB (ref 65–99)
Potassium: 3.2 mmol/L — ABNORMAL LOW (ref 3.5–5.1)
SODIUM: 140 mmol/L (ref 135–145)

## 2015-11-28 LAB — MAGNESIUM: MAGNESIUM: 1.6 mg/dL — AB (ref 1.7–2.4)

## 2015-11-28 MED ORDER — MAGNESIUM OXIDE 400 (241.3 MG) MG PO TABS: 800.0000 mg | ORAL_TABLET | Freq: Once | ORAL | Status: DC

## 2015-11-28 MED ORDER — POTASSIUM CHLORIDE CRYS ER 20 MEQ PO TBCR
40.0000 meq | EXTENDED_RELEASE_TABLET | Freq: Once | ORAL | Status: AC
  Administered 2015-11-28: 40 meq via ORAL
  Filled 2015-11-28: qty 2

## 2015-11-28 MED ORDER — ASPIRIN EC 325 MG PO TBEC: 325.0000 mg | DELAYED_RELEASE_TABLET | Freq: Two times a day (BID) | ORAL | Status: DC

## 2015-11-28 MED ORDER — LACTULOSE 10 GM/15ML PO SOLN: 20.0000 g | Freq: Once | ORAL | Status: DC

## 2015-11-28 NOTE — Clinical Social Work Placement (Signed)
   CLINICAL SOCIAL WORK PLACEMENT  NOTE  Date:  11/28/2015  Patient Details  Name: April Rogers MRN: [id number] Date of Birth: 12-02-1940  Clinical Social Work is seeking post-discharge placement for this patient at the [place] level of care (*CSW will initial, date and re-position this form in  chart as items are completed):  Yes   Patient/family provided with [place] Work Department's list of facilities offering this level of care within the geographic area requested by the patient (or if unable, by the patient's family).  Yes   Patient/family informed of their freedom to choose among providers that offer the needed level of care, that participate in Medicare, Medicaid or managed care program needed by the patient, have an available bed and are willing to accept the patient.  Yes   Patient/family informed of [facility]'s ownership interest in [facility] and [facility], as well as of the fact that they are under no obligation to receive care at these facilities.  PASRR submitted to EDS on 11/26/15     PASRR number received on 11/26/15     Existing PASRR number confirmed on       FL2 transmitted to all facilities in geographic area requested by pt/family on 11/26/15     FL2 transmitted to all facilities within larger geographic area on       Patient informed that his/her managed care company has contracts with or will negotiate with certain facilities, including the following:        Yes   Patient/family informed of bed offers received.  Patient chooses bed at [facility]     Physician recommends and patient chooses bed at  [facility])    Patient to be transferred to [facility] on 11/28/15.  Patient to be transferred to facility by [facility]     Patient family notified on 11/28/15 of transfer.  Name of family member notified:  Pt will contact family     PHYSICIAN       Additional Comment:     _______________________________________________ Darden Dates, LCSW 11/28/2015, 10:41 AM

## 2015-11-28 NOTE — Discharge Summary (Addendum)
[facility] NAME: Tilla Bridson    MR#:  [id number]  [place] OF BIRTH:  04/06/1941  DATE OF ADMISSION:  11/25/2015 ADMITTING PHYSICIAN: Earnestine Leys, MD  DATE OF DISCHARGE: 11/28/2015 PRIMARY CARE PHYSICIAN: Tama High III, MD    ADMISSION DIAGNOSIS:  Closed fracture of neck of right femur, initial encounter ([place]) [S72.001A]   DISCHARGE DIAGNOSIS:  Femur fracture, right, closed, initial encounter  SECONDARY DIAGNOSIS:   Past Medical History  Diagnosis Date  . Anxiety   . Arthritis   . Depression   . Hypertension   . Cancer ([place])   . Macular degeneration of both eyes   . Fracture of one rib   . GERD (gastroesophageal reflux disease)     was on nexium but no longer needs to take this medication    HOSPITAL COURSE:   75 year old female with essential hypertension and esophagitis status post mechanical fall and unfortunately has suffered right hip fracture.  1. Intertrochanteric fracture of right hip fracture status post mechanical fall Postop recovery is going well Appreciate orthopedic help. Pain well controlled. DVT prophylaxis with Lovenox Physical therapy to continue Skilled nursing facility at discharge.  * Acute blood loss anemia status post surgery Monitor. No need of transfusion at this point.  2. Essential hypertension Continue Norvasc  3. GERD:   4. Hypokalemia:  Replaced KCl.   Hypomagnesemia. Mag PO. DISCHARGE CONDITIONS:   Stable, discharge to SNF today.  CONSULTS OBTAINED:  Treatment Team:  Lytle Butte, MD  DRUG ALLERGIES:   Allergies  Allergen Reactions  . Penicillins Hives  . Actonel [Risedronate]     Does not remember reaction  . Alendronate     Generic medicine .... Unsure of reaction  . Codeine     hallucinations  . Meloxicam     Unsure of reaction  . Percocet [Oxycodone-Acetaminophen]     hallucinations  . Varenicline     Unsure of medication or reaction  .  Wellbutrin [Bupropion]     nightmares  . Wine [Alcohol] Hives    DISCHARGE MEDICATIONS:   Current Discharge Medication List    START taking these medications   Details  enoxaparin (LOVENOX) 40 MG/0.4ML injection Inject 0.4 mLs (40 mg total) into the skin daily. Qty: 12 Syringe, Refills: 0    ferrous sulfate 325 (65 FE) MG tablet Take 1 tablet (325 mg total) by mouth daily with breakfast. Qty: 30 tablet, Refills: 0    HYDROcodone-acetaminophen (NORCO/VICODIN) 5-325 MG tablet Take 1-2 tablets by mouth every 6 (six) hours as needed for moderate pain. Qty: 30 tablet, Refills: 0    senna (SENOKOT) 8.6 MG TABS tablet Take 1 tablet (8.6 mg total) by mouth 2 (two) times daily. Qty: 120 each, Refills: 0      CONTINUE these medications which have NOT CHANGED   Details  amLODipine (NORVASC) 5 MG tablet Take 5 mg by mouth daily.    aspirin (ASPIRIN EC) 81 MG EC tablet Take 81 mg by mouth daily. Swallow whole.    b complex vitamins tablet Take 1 tablet by mouth daily.    Cholecalciferol 10000 units TABS Take 1,000 Units by mouth daily.    esomeprazole (NEXIUM) 20 MG capsule Take 20 mg by mouth daily at 12 noon.    ibuprofen (ADVIL,MOTRIN) 200 MG tablet Take 200 mg by mouth daily.    Multiple Vitamins-Minerals (ANTIOXIDANT FORMULA SG) capsule Take 2 capsules by mouth daily.    Multiple  Vitamins-Minerals (CENTRUM SILVER PO) Take 1 tablet by mouth daily.    naproxen sodium (ANAPROX) 220 MG tablet Take 220 mg by mouth 2 (two) times daily with a meal. Reported on 08/03/2015    omega-3 acid ethyl esters (LOVAZA) 1 g capsule Take 1 g by mouth daily.    venlafaxine XR (EFFEXOR-XR) 150 MG 24 hr capsule Take 150 mg by mouth daily with breakfast.         DISCHARGE INSTRUCTIONS:    If you experience worsening of your admission symptoms, develop shortness of breath, life threatening emergency, suicidal or homicidal thoughts you must seek medical attention immediately by calling 911 or  calling your MD immediately  if symptoms less severe.  You Must read complete instructions/literature along with all the possible adverse reactions/side effects for all the Medicines you take and that have been prescribed to you. Take any new Medicines after you have completely understood and accept all the possible adverse reactions/side effects.   Please note  You were cared for by a hospitalist during your hospital stay. If you have any questions about your discharge medications or the care you received while you were in the hospital after you are discharged, you can call the unit and asked to speak with the hospitalist on call if the hospitalist that took care of you is not available. Once you are discharged, your primary care physician will handle any further medical issues. Please note that NO REFILLS for any discharge medications will be authorized once you are discharged, as it is imperative that you return to your primary care physician (or establish a relationship with a primary care physician if you do not have one) for your aftercare needs so that they can reassess your need for medications and monitor your lab values.    Today   SUBJECTIVE    No complaint.  VITAL SIGNS:  Blood pressure 138/45, pulse 88, temperature 99 F (37.2 C), temperature source Oral, resp. rate 17, height 5\' 4"  (1.626 m), weight 60.328 kg (133 lb), SpO2 91 %.  I/O:   Intake/Output Summary (Last 24 hours) at 11/28/15 1153 Last data filed at 11/28/15 0956  Gross per 24 hour  Intake      0 ml  Output   1000 ml  Net  -1000 ml    PHYSICAL EXAMINATION:  GENERAL:  75 y.o.-year-old patient lying in the bed with no acute distress.  EYES: Pupils equal, round, reactive to light and accommodation. No scleral icterus. Extraocular muscles intact.  HEENT: Head atraumatic, normocephalic. Oropharynx and nasopharynx clear.  NECK:  Supple, no jugular venous distention. No thyroid enlargement, no tenderness.  LUNGS:  Normal breath sounds bilaterally, no wheezing, rales,rhonchi or crepitation. No use of accessory muscles of respiration.  CARDIOVASCULAR: S1, S2 normal. No murmurs, rubs, or gallops.  ABDOMEN: Soft, non-tender, non-distended. Bowel sounds present. No organomegaly or mass.  EXTREMITIES: No pedal edema, cyanosis, or clubbing.  NEUROLOGIC: Cranial nerves II through XII are intact. Muscle strength 4/5 in all extremities. Sensation intact. Gait not checked.  PSYCHIATRIC: The patient is alert and oriented x 3.  SKIN: No obvious rash, lesion, or ulcer.   DATA REVIEW:   CBC  Recent Labs Lab 11/28/15 0406  WBC 6.6  HGB 8.7*  HCT 25.4*  PLT 125*    Chemistries   Recent Labs Lab 11/25/15 1530  11/28/15 0406  NA 138  < > 140  K 3.3*  < > 3.2*  CL 101  < > 107  CO2  27  < > 29  GLUCOSE 105*  < > 124*  BUN 20  < > 8  CREATININE 0.66  < > 0.46  CALCIUM 9.1  < > 8.2*  MG  --   --  1.6*  AST 30  --   --   ALT 31  --   --   ALKPHOS 74  --   --   BILITOT 0.7  --   --   < > = values in this interval not displayed.  Cardiac Enzymes No results for input(s): TROPONINI in the last 168 hours.  Microbiology Results  Results for orders placed or performed during the hospital encounter of 11/25/15  Surgical PCR screen     Status: None   Collection Time: 11/25/15 10:00 PM  Result Value Ref Range Status   MRSA, PCR NEGATIVE NEGATIVE Final   Staphylococcus aureus NEGATIVE NEGATIVE Final    Comment:        The Xpert SA Assay (FDA approved for NASAL specimens in patients over 56 years of age), is one component of a comprehensive surveillance program.  Test performance has been validated by [facility] for patients greater than or equal to 59 year old. It is not intended to diagnose infection nor to guide or monitor treatment.     RADIOLOGY:  Dg Hip Operative Unilat W Or W/o Pelvis Right  11/26/2015  CLINICAL DATA:  75 year old female undergoing ORIF right femur intertrochanteric  fracture. Initial encounter. EXAM: OPERATIVE RIGHT HIP (WITH PELVIS IF PERFORMED) 4 VIEWS TECHNIQUE: Fluoroscopic spot image(s) were submitted for interpretation post-operatively. COMPARISON:  11/25/2015 FINDINGS: 4 intraoperative fluoroscopic views of the proximal right femur demonstrate improved fracture alignment on the first 2 images, appearing near anatomic. The second 2 images demonstrate a proximal right femur intra medullary rod with interlocking proximal hip screw. FLUOROSCOPY TIME:  0 minutes 31 seconds IMPRESSION: ORIF right proximal femur with no adverse features. Electronically Signed   By: Genevie Ann M.D.   On: 11/26/2015 13:41        Management plans discussed with the patient, family and they are in agreement.  CODE STATUS:  Code Status History    Date Active Date Inactive Code Status Order ID Comments User Context   11/25/2015  6:06 PM 11/26/2015  2:28 PM Full Code [id number]  Earnestine Leys, MD ED   11/25/2015  4:50 PM 11/25/2015  6:06 PM Full Code [id number]  Lytle Butte, MD ED    Advance Directive Documentation        Most Recent Value   Type of Advance Directive  Healthcare Power of Attorney   Pre-existing out of facility DNR order (yellow form or pink MOST form)     "MOST" Form in Place?        TOTAL TIME TAKING CARE OF THIS PATIENT: 33 minutes.    Demetrios Loll M.D on 11/28/2015 at 11:53 AM  Between 7am to 6pm - Pager - [phone]  After 6pm go to [email or web address] - password [email or web address] [email or web address]  [place] [facility]  Office  [phone]  CC: Primary care physician; Adin Hector, MD

## 2015-11-28 NOTE — Progress Notes (Signed)
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 Pt is now s/p R IM nailing. Patient reports she has had frequent falls over the last years. Pt reports that her pain is better today, but she feel exhausted.     PT Comments    Pt tolerating treatment session well, motivated and able to complete entire PT sesssion as planned. Pt continues to make progress toward goals as evidenced by ability to progress transfers and gait this session. Pt's greatest limitation continues to be limited pain control during functional mobility, which continues to limit ability to perform most mobility and ADL at baseline function. Pt demonstrates good understanding of weight bearing status. Patient presenting with impairment of strength, pain, range of motion, balance, and activity tolerance, limiting ability to perform ADL and mobility tasks at  baseline level of function. Patient will benefit from skilled intervention to address the above impairments and limitations, in order to restore to prior level of function, improve patient safety upon discharge, and to decrease caregiver burden.    Follow Up Recommendations  SNF     Equipment Recommendations       Recommendations for Other Services       Precautions / Restrictions Precautions Precautions: Fall Restrictions Weight Bearing Restrictions: Yes RLE Weight Bearing: Partial weight bearing    Mobility  Bed Mobility Overal bed mobility: Needs Assistance Bed Mobility: Supine to Sit     Supine to sit: Min assist        Transfers Overall transfer level: Needs assistance Equipment used: Rolling walker (2 wheeled) Transfers: Sit to/from [facility] Sit to Stand: Min assist Stand pivot transfers: Min assist       General transfer comment: performs most of transfer on her  own, but with thighs resting against bed. Some assistance to get her up all the way and balance on RW, R foot in neutral in frontal plane. able to perform a pivot to [facility] for voiding.   Ambulation/Gait Ambulation/Gait assistance: Min guard Ambulation Distance (Feet): 12 Feet Assistive device: Rolling walker (2 wheeled)       General Gait Details: small steps, alternating fwd and retroAMB, controlled and good attention to WBstatus, howevere extremely painful, provoking tears. tachycardia @119bpm  taken as a sign that pain is not well controlled. Session ended and called for more meds.    Stairs            Wheelchair Mobility    Modified Rankin (Stroke Patients Only)       Balance Overall balance assessment: Needs assistance   Sitting balance-Leahy Scale: Good       Standing balance-Leahy Scale: Fair                      Cognition Arousal/Alertness: Awake/alert Behavior During Therapy: WFL for tasks assessed/performed Overall Cognitive Status: Within Functional Limits for tasks assessed                      Exercises Other Exercises Other Exercises: Supine RLE heel slides 1x10 minA for physical assist.  Other Exercises: Supine RLE quad sets 1x10 minA for physical assist.  Other Exercises: Supine RLE abduction/adduciton heel slides 1x10 minA for physical assist.  Other Exercises: Supine BLE briging with knees at 65 degrees; Ankle pumps 20x, manually resisted PF.     General Comments  Pertinent Vitals/Pain Pain Assessment: 0-10 Pain Score: 4  Pain Location: R hip (10/10 with mobility; RN notified, session limited)  Pain Intervention(s): Limited activity within patient's tolerance;Premedicated before session;Patient requesting pain meds-RN notified;RN gave pain meds during session;Ice applied;Repositioned    Home Living                      Prior Function            PT Goals (current goals can now be found in the care plan  section) Acute Rehab PT Goals Patient Stated Goal: to be able to move around without pain, be as independent as possible. PT Goal Formulation: With patient Time For Goal Achievement: 12/11/15 Potential to Achieve Goals: Good Additional Goals Additional Goal #1: Pt will be able to perform bed mobility/transfers with cga and rw in order to improve functional independence Progress towards PT goals: Progressing toward goals    Frequency  BID    PT Plan Current plan remains appropriate    Co-evaluation             End of Session Equipment Utilized During Treatment: Gait belt Activity Tolerance: Patient limited by pain Patient left: in chair;with call bell/phone within reach;with chair alarm set;with family/visitor present     Time: [id number] PT Time Calculation (min) (ACUTE ONLY): 33 min  Charges:  $Gait Training: 8-22 mins $Therapeutic Exercise: 8-22 mins                    G Codes:      12:06 PM, 24-Dec-2015 Etta Grandchild, PT, DPT PRN Physical Therapist - [place] License # [id number] ([phone] (mobile)

## 2015-11-28 NOTE — Progress Notes (Signed)
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 Pt is now s/p R IM nailing. Patient reports she has had frequent falls over the last years. Pt reports that her pain is better today, but she feel exhausted.    OT comments  Patient supine in bed when OT arrived. Stated pain was much more manageable than yesterday. Patient able to perform bed mobility with [facility] Toilet transfer with MIN A using rolling walker. VC for hand placement and safety with positioning of RLE when sitting. Patient educated on LB dressing adaptations using AE. Patient able to perform with MIN A/VC for sequencing/use of AE.   Follow Up Recommendations  SNF    Equipment Recommendations       Recommendations for Other Services      Precautions / Restrictions Precautions Precautions: Fall Restrictions Weight Bearing Restrictions: Yes RLE Weight Bearing: Partial weight bearing       Mobility Bed Mobility Overal bed mobility: Needs Assistance Bed Mobility: Supine to Sit     Supine to sit: Min guard        Transfers Overall transfer level: Needs assistance Equipment used: Rolling walker (2 wheeled) Transfers: Sit to/from [facility] Sit to Stand: Min guard Stand pivot transfers: Min guard       General transfer comment: performs most of transfer on her own, but with thighs resting against bed. Some assistance to get her up all the way and balance on RW, R foot in neutral in frontal plane. able to perform a pivot to [facility] for voiding.     Balance Overall balance assessment: Needs assistance   Sitting balance-Leahy Scale: Good       Standing balance-Leahy Scale: Fair                     ADL Overall ADL's : Needs assistance/impaired                     Lower Body Dressing: Set up;Minimal  assistance   Toilet Transfer: Min guard                    Vision                     Perception     Praxis      Cognition   Behavior During Therapy: WFL for tasks assessed/performed Overall Cognitive Status: Within Functional Limits for tasks assessed                       Extremity/Trunk Assessment               Exercises Other Exercises Other Exercises: Supine RLE heel slides 1x10 minA for physical assist.  Other Exercises: Supine RLE quad sets 1x10 minA for physical assist.  Other Exercises: Supine RLE abduction/adduciton heel slides 1x10 minA for physical assist.  Other Exercises: Supine BLE briging with knees at 65 degrees; Ankle pumps 20x, manually resisted PF.    Shoulder Instructions       General Comments      Pertinent Vitals/ Pain       Pain Assessment: 0-10 Pain Score: 4  Pain Location: R hip Pain Intervention(s): Monitored during session;Repositioned  Home Living  Prior Functioning/Environment              Frequency Min 1X/week     Progress Toward Goals  OT Goals(current goals can now be found in the care plan section)  Progress towards OT goals: Progressing toward goals  Acute Rehab OT Goals Patient Stated Goal: to be able to move around without pain, be as independent as possible. OT Goal Formulation: With patient Time For Goal Achievement: 12/11/15 Potential to Achieve Goals: Good  Plan Discharge plan remains appropriate    Co-evaluation                 End of Session Equipment Utilized During Treatment: Gait belt;Rolling walker   Activity Tolerance Patient tolerated treatment well   Patient Left in bed;with call bell/phone within reach;with nursing/sitter in room   Nurse Communication          Time: [id number] OT Time Calculation (min): 19 min  Charges: OT General Charges $OT Visit: 1 Procedure OT Treatments $Self Care/Home  Management : 8-22 mins  Clevland Cork L 11/28/2015, 2:03 PM Amie Portland, OTR/L

## 2015-11-28 NOTE — Progress Notes (Signed)
Report called to [facility]. Patient still needs a BM. Will call EMS when patient has a BM. Continue to monitor.

## 2015-11-28 NOTE — Clinical Social Work Note (Signed)
Pt is ready for discharge today and will go to [facility]. Pt is aware and agreeable to discharge plan. Pt will contact family. [facility] has received discharge information and is ready to accept pt as they have received discharge information yesterday. RN to call report, and [facility] will provide transportation to facility. CSW is signing off as no further needs identified.   Darden Dates, MSW, LCSW  Clinical Social Worker  [phone]

## 2015-11-28 NOTE — Progress Notes (Signed)
Subjective: 2 Days Post-Op Procedure(s) (LRB): INTRAMEDULLARY (IM) NAIL INTERTROCHANTRIC (Right)    Patient reports pain as mild.  Objective:   VITALS:   Filed Vitals:   11/28/15 0735 11/28/15 1157  BP: 138/45   Pulse: 88 119  Temp: 99 F (37.2 C)   Resp: 17     Neurologically intact ABD soft Neurovascular intact Sensation intact distally Intact pulses distally Dorsiflexion/Plantar flexion intact  LABS  Recent Labs  11/26/15 0408 11/27/15 0515 11/28/15 0406  HGB 11.9* 9.6* 8.7*  HCT 34.0* 28.0* 25.4*  WBC 8.9 7.9 6.6  PLT 172 135* 125*     Recent Labs  11/26/15 0408 11/27/15 0515 11/28/15 0406  NA 135 137 140  K 3.7 3.6 3.2*  BUN 14 7 8   CREATININE 0.49 0.56 0.46  GLUCOSE 117* 143* 124*     Recent Labs  11/25/15 1925  INR 1.06     Assessment/Plan: 2 Days Post-Op Procedure(s) (LRB): INTRAMEDULLARY (IM) NAIL INTERTROCHANTRIC (Right)   Up with therapy Discharge to SNF  RTC 10 days

## 2015-11-28 NOTE — Discharge Instructions (Signed)
Heart healthy diet.  Activity as tolerated. PT and fall precaution.

## 2015-11-30 DIAGNOSIS — I10 Essential (primary) hypertension: Secondary | ICD-10-CM | POA: Diagnosis not present

## 2015-11-30 DIAGNOSIS — D649 Anemia, unspecified: Secondary | ICD-10-CM | POA: Diagnosis not present

## 2015-11-30 DIAGNOSIS — K5900 Constipation, unspecified: Secondary | ICD-10-CM | POA: Diagnosis not present

## 2015-11-30 DIAGNOSIS — M810 Age-related osteoporosis without current pathological fracture: Secondary | ICD-10-CM | POA: Diagnosis not present

## 2015-12-01 DIAGNOSIS — D62 Acute posthemorrhagic anemia: Secondary | ICD-10-CM | POA: Diagnosis not present

## 2015-12-01 DIAGNOSIS — E876 Hypokalemia: Secondary | ICD-10-CM | POA: Diagnosis not present

## 2015-12-01 LAB — CBC WITH DIFFERENTIAL/PLATELET
BASOS PCT: 0 %
Basophils Absolute: 0 10*3/uL (ref 0–0.1)
EOS ABS: 0.1 10*3/uL (ref 0–0.7)
EOS PCT: 1 %
HCT: 24.7 % — ABNORMAL LOW (ref 35.0–47.0)
Hemoglobin: 8.5 g/dL — ABNORMAL LOW (ref 12.0–16.0)
LYMPHS ABS: 1.1 10*3/uL (ref 1.0–3.6)
Lymphocytes Relative: 17 %
MCH: 33.9 pg (ref 26.0–34.0)
MCHC: 34.5 g/dL (ref 32.0–36.0)
MCV: 98.3 fL (ref 80.0–100.0)
MONO ABS: 0.5 10*3/uL (ref 0.2–0.9)
MONOS PCT: 8 %
NEUTROS PCT: 74 %
Neutro Abs: 4.9 10*3/uL (ref 1.4–6.5)
PLATELETS: 197 10*3/uL (ref 150–440)
RBC: 2.51 MIL/uL — ABNORMAL LOW (ref 3.80–5.20)
RDW: 12.7 % (ref 11.5–14.5)
WBC: 6.6 10*3/uL (ref 3.6–11.0)

## 2015-12-01 LAB — COMPREHENSIVE METABOLIC PANEL
ALBUMIN: 3 g/dL — AB (ref 3.5–5.0)
ALT: 58 U/L — ABNORMAL HIGH (ref 14–54)
ANION GAP: 7 (ref 5–15)
AST: 40 U/L (ref 15–41)
Alkaline Phosphatase: 85 U/L (ref 38–126)
BUN: 12 mg/dL (ref 6–20)
CALCIUM: 8.6 mg/dL — AB (ref 8.9–10.3)
CO2: 29 mmol/L (ref 22–32)
Chloride: 102 mmol/L (ref 101–111)
Creatinine, Ser: 0.49 mg/dL (ref 0.44–1.00)
GFR calc non Af Amer: >60 mL/min (ref >60)
GLUCOSE: 105 mg/dL — AB (ref 65–99)
POTASSIUM: 2.8 mmol/L — AB (ref 3.5–5.1)
SODIUM: 138 mmol/L (ref 135–145)
TOTAL PROTEIN: 5.7 g/dL — AB (ref 6.5–8.1)
Total Bilirubin: 1.2 mg/dL (ref 0.3–1.2)

## 2015-12-02 DIAGNOSIS — E876 Hypokalemia: Secondary | ICD-10-CM | POA: Diagnosis not present

## 2015-12-02 DIAGNOSIS — D62 Acute posthemorrhagic anemia: Secondary | ICD-10-CM | POA: Diagnosis not present

## 2015-12-02 LAB — COMPREHENSIVE METABOLIC PANEL
ALT: 52 U/L (ref 14–54)
ANION GAP: 7 (ref 5–15)
AST: 38 U/L (ref 15–41)
Albumin: 2.9 g/dL — ABNORMAL LOW (ref 3.5–5.0)
Alkaline Phosphatase: 82 U/L (ref 38–126)
BILIRUBIN TOTAL: 0.7 mg/dL (ref 0.3–1.2)
BUN: 16 mg/dL (ref 6–20)
CHLORIDE: 104 mmol/L (ref 101–111)
CO2: 28 mmol/L (ref 22–32)
Calcium: 8.7 mg/dL — ABNORMAL LOW (ref 8.9–10.3)
Creatinine, Ser: 0.52 mg/dL (ref 0.44–1.00)
Glucose, Bld: 101 mg/dL — ABNORMAL HIGH (ref 65–99)
POTASSIUM: 3.7 mmol/L (ref 3.5–5.1)
Sodium: 139 mmol/L (ref 135–145)
TOTAL PROTEIN: 5.7 g/dL — AB (ref 6.5–8.1)

## 2015-12-02 LAB — CBC WITH DIFFERENTIAL/PLATELET
BASOS ABS: 0 10*3/uL (ref 0–0.1)
Basophils Relative: 0 %
EOS PCT: 2 %
Eosinophils Absolute: 0.1 10*3/uL (ref 0–0.7)
HCT: 24.9 % — ABNORMAL LOW (ref 35.0–47.0)
Hemoglobin: 8.5 g/dL — ABNORMAL LOW (ref 12.0–16.0)
LYMPHS PCT: 20 %
Lymphs Abs: 1.1 10*3/uL (ref 1.0–3.6)
MCH: 34.1 pg — ABNORMAL HIGH (ref 26.0–34.0)
MCHC: 34.3 g/dL (ref 32.0–36.0)
MCV: 99.5 fL (ref 80.0–100.0)
MONO ABS: 0.6 10*3/uL (ref 0.2–0.9)
MONOS PCT: 11 %
Neutro Abs: 3.7 10*3/uL (ref 1.4–6.5)
Neutrophils Relative %: 67 %
PLATELETS: 216 10*3/uL (ref 150–440)
RBC: 2.5 MIL/uL — ABNORMAL LOW (ref 3.80–5.20)
RDW: 12.6 % (ref 11.5–14.5)
WBC: 5.5 10*3/uL (ref 3.6–11.0)

## 2015-12-09 DIAGNOSIS — E876 Hypokalemia: Secondary | ICD-10-CM | POA: Diagnosis not present

## 2015-12-09 DIAGNOSIS — D62 Acute posthemorrhagic anemia: Secondary | ICD-10-CM | POA: Diagnosis not present

## 2015-12-09 LAB — CBC
HEMATOCRIT: 28.8 % — AB (ref 35.0–47.0)
HEMOGLOBIN: 9.8 g/dL — AB (ref 12.0–16.0)
MCH: 33.5 pg (ref 26.0–34.0)
MCHC: 34 g/dL (ref 32.0–36.0)
MCV: 98.4 fL (ref 80.0–100.0)
Platelets: 387 10*3/uL (ref 150–440)
RBC: 2.92 MIL/uL — AB (ref 3.80–5.20)
RDW: 13.2 % (ref 11.5–14.5)
WBC: 6.7 10*3/uL (ref 3.6–11.0)

## 2015-12-09 LAB — COMPREHENSIVE METABOLIC PANEL
ALT: 28 U/L (ref 14–54)
ANION GAP: 6 (ref 5–15)
AST: 20 U/L (ref 15–41)
Albumin: 3.3 g/dL — ABNORMAL LOW (ref 3.5–5.0)
Alkaline Phosphatase: 116 U/L (ref 38–126)
BILIRUBIN TOTAL: 0.6 mg/dL (ref 0.3–1.2)
BUN: 10 mg/dL (ref 6–20)
CO2: 30 mmol/L (ref 22–32)
Calcium: 9.2 mg/dL (ref 8.9–10.3)
Chloride: 105 mmol/L (ref 101–111)
Creatinine, Ser: 0.6 mg/dL (ref 0.44–1.00)
GFR calc Af Amer: >60 mL/min (ref >60)
Glucose, Bld: 98 mg/dL (ref 65–99)
POTASSIUM: 4.3 mmol/L (ref 3.5–5.1)
Sodium: 141 mmol/L (ref 135–145)
TOTAL PROTEIN: 6.3 g/dL — AB (ref 6.5–8.1)

## 2015-12-10 ENCOUNTER — Encounter
Admission: RE | Admit: 2015-12-10 | Discharge: 2015-12-10 | Disposition: A | Discharge status: Home / Self Care | Payer: Commercial Managed Care - HMO | Source: Ambulatory Visit | Attending: Internal Medicine | Admitting: Internal Medicine

## 2015-12-14 DIAGNOSIS — K219 Gastro-esophageal reflux disease without esophagitis: Secondary | ICD-10-CM | POA: Diagnosis not present

## 2015-12-14 DIAGNOSIS — S72141D Displaced intertrochanteric fracture of right femur, subsequent encounter for closed fracture with routine healing: Secondary | ICD-10-CM | POA: Diagnosis not present

## 2015-12-14 DIAGNOSIS — I10 Essential (primary) hypertension: Secondary | ICD-10-CM | POA: Diagnosis not present

## 2015-12-14 DIAGNOSIS — F419 Anxiety disorder, unspecified: Secondary | ICD-10-CM | POA: Diagnosis not present

## 2015-12-14 DIAGNOSIS — H3530 Unspecified macular degeneration: Secondary | ICD-10-CM | POA: Diagnosis not present

## 2015-12-14 DIAGNOSIS — S72141A Displaced intertrochanteric fracture of right femur, initial encounter for closed fracture: Secondary | ICD-10-CM | POA: Diagnosis not present

## 2015-12-14 DIAGNOSIS — F329 Major depressive disorder, single episode, unspecified: Secondary | ICD-10-CM | POA: Diagnosis not present

## 2015-12-14 DIAGNOSIS — Z859 Personal history of malignant neoplasm, unspecified: Secondary | ICD-10-CM | POA: Diagnosis not present

## 2015-12-14 DIAGNOSIS — Z8781 Personal history of (healed) traumatic fracture: Secondary | ICD-10-CM | POA: Diagnosis not present

## 2015-12-14 DIAGNOSIS — Z9181 History of falling: Secondary | ICD-10-CM | POA: Diagnosis not present

## 2015-12-15 DIAGNOSIS — S72141D Displaced intertrochanteric fracture of right femur, subsequent encounter for closed fracture with routine healing: Secondary | ICD-10-CM | POA: Diagnosis not present

## 2015-12-15 DIAGNOSIS — Z9181 History of falling: Secondary | ICD-10-CM | POA: Diagnosis not present

## 2015-12-15 DIAGNOSIS — I10 Essential (primary) hypertension: Secondary | ICD-10-CM | POA: Diagnosis not present

## 2015-12-15 DIAGNOSIS — Z859 Personal history of malignant neoplasm, unspecified: Secondary | ICD-10-CM | POA: Diagnosis not present

## 2015-12-15 DIAGNOSIS — F329 Major depressive disorder, single episode, unspecified: Secondary | ICD-10-CM | POA: Diagnosis not present

## 2015-12-15 DIAGNOSIS — K219 Gastro-esophageal reflux disease without esophagitis: Secondary | ICD-10-CM | POA: Diagnosis not present

## 2015-12-15 DIAGNOSIS — Z8781 Personal history of (healed) traumatic fracture: Secondary | ICD-10-CM | POA: Diagnosis not present

## 2015-12-15 DIAGNOSIS — F419 Anxiety disorder, unspecified: Secondary | ICD-10-CM | POA: Diagnosis not present

## 2015-12-15 DIAGNOSIS — H3530 Unspecified macular degeneration: Secondary | ICD-10-CM | POA: Diagnosis not present

## 2015-12-17 DIAGNOSIS — Z859 Personal history of malignant neoplasm, unspecified: Secondary | ICD-10-CM | POA: Diagnosis not present

## 2015-12-17 DIAGNOSIS — S728X1S Other fracture of right femur, sequela: Secondary | ICD-10-CM | POA: Diagnosis not present

## 2015-12-17 DIAGNOSIS — F329 Major depressive disorder, single episode, unspecified: Secondary | ICD-10-CM | POA: Diagnosis not present

## 2015-12-17 DIAGNOSIS — S72141D Displaced intertrochanteric fracture of right femur, subsequent encounter for closed fracture with routine healing: Secondary | ICD-10-CM | POA: Diagnosis not present

## 2015-12-17 DIAGNOSIS — Z9181 History of falling: Secondary | ICD-10-CM | POA: Diagnosis not present

## 2015-12-17 DIAGNOSIS — F3342 Major depressive disorder, recurrent, in full remission: Secondary | ICD-10-CM | POA: Diagnosis not present

## 2015-12-17 DIAGNOSIS — Z8781 Personal history of (healed) traumatic fracture: Secondary | ICD-10-CM | POA: Diagnosis not present

## 2015-12-17 DIAGNOSIS — I10 Essential (primary) hypertension: Secondary | ICD-10-CM | POA: Diagnosis not present

## 2015-12-17 DIAGNOSIS — H3530 Unspecified macular degeneration: Secondary | ICD-10-CM | POA: Diagnosis not present

## 2015-12-17 DIAGNOSIS — F419 Anxiety disorder, unspecified: Secondary | ICD-10-CM | POA: Diagnosis not present

## 2015-12-17 DIAGNOSIS — K219 Gastro-esophageal reflux disease without esophagitis: Secondary | ICD-10-CM | POA: Diagnosis not present

## 2015-12-20 DIAGNOSIS — F419 Anxiety disorder, unspecified: Secondary | ICD-10-CM | POA: Diagnosis not present

## 2015-12-20 DIAGNOSIS — K219 Gastro-esophageal reflux disease without esophagitis: Secondary | ICD-10-CM | POA: Diagnosis not present

## 2015-12-20 DIAGNOSIS — Z9181 History of falling: Secondary | ICD-10-CM | POA: Diagnosis not present

## 2015-12-20 DIAGNOSIS — F329 Major depressive disorder, single episode, unspecified: Secondary | ICD-10-CM | POA: Diagnosis not present

## 2015-12-20 DIAGNOSIS — H3530 Unspecified macular degeneration: Secondary | ICD-10-CM | POA: Diagnosis not present

## 2015-12-20 DIAGNOSIS — S72141D Displaced intertrochanteric fracture of right femur, subsequent encounter for closed fracture with routine healing: Secondary | ICD-10-CM | POA: Diagnosis not present

## 2015-12-20 DIAGNOSIS — Z859 Personal history of malignant neoplasm, unspecified: Secondary | ICD-10-CM | POA: Diagnosis not present

## 2015-12-20 DIAGNOSIS — Z8781 Personal history of (healed) traumatic fracture: Secondary | ICD-10-CM | POA: Diagnosis not present

## 2015-12-20 DIAGNOSIS — I10 Essential (primary) hypertension: Secondary | ICD-10-CM | POA: Diagnosis not present

## 2015-12-23 DIAGNOSIS — F329 Major depressive disorder, single episode, unspecified: Secondary | ICD-10-CM | POA: Diagnosis not present

## 2015-12-23 DIAGNOSIS — F419 Anxiety disorder, unspecified: Secondary | ICD-10-CM | POA: Diagnosis not present

## 2015-12-23 DIAGNOSIS — Z859 Personal history of malignant neoplasm, unspecified: Secondary | ICD-10-CM | POA: Diagnosis not present

## 2015-12-23 DIAGNOSIS — I10 Essential (primary) hypertension: Secondary | ICD-10-CM | POA: Diagnosis not present

## 2015-12-23 DIAGNOSIS — S72141D Displaced intertrochanteric fracture of right femur, subsequent encounter for closed fracture with routine healing: Secondary | ICD-10-CM | POA: Diagnosis not present

## 2015-12-23 DIAGNOSIS — K219 Gastro-esophageal reflux disease without esophagitis: Secondary | ICD-10-CM | POA: Diagnosis not present

## 2015-12-23 DIAGNOSIS — Z8781 Personal history of (healed) traumatic fracture: Secondary | ICD-10-CM | POA: Diagnosis not present

## 2015-12-23 DIAGNOSIS — Z9181 History of falling: Secondary | ICD-10-CM | POA: Diagnosis not present

## 2015-12-23 DIAGNOSIS — H3530 Unspecified macular degeneration: Secondary | ICD-10-CM | POA: Diagnosis not present

## 2015-12-25 DIAGNOSIS — M4856XA Collapsed vertebra, not elsewhere classified, lumbar region, initial encounter for fracture: Secondary | ICD-10-CM | POA: Diagnosis not present

## 2016-01-04 DIAGNOSIS — I10 Essential (primary) hypertension: Secondary | ICD-10-CM | POA: Diagnosis not present

## 2016-01-04 DIAGNOSIS — H3530 Unspecified macular degeneration: Secondary | ICD-10-CM | POA: Diagnosis not present

## 2016-01-04 DIAGNOSIS — F419 Anxiety disorder, unspecified: Secondary | ICD-10-CM | POA: Diagnosis not present

## 2016-01-04 DIAGNOSIS — K219 Gastro-esophageal reflux disease without esophagitis: Secondary | ICD-10-CM | POA: Diagnosis not present

## 2016-01-04 DIAGNOSIS — Z859 Personal history of malignant neoplasm, unspecified: Secondary | ICD-10-CM | POA: Diagnosis not present

## 2016-01-04 DIAGNOSIS — F329 Major depressive disorder, single episode, unspecified: Secondary | ICD-10-CM | POA: Diagnosis not present

## 2016-01-04 DIAGNOSIS — Z9181 History of falling: Secondary | ICD-10-CM | POA: Diagnosis not present

## 2016-01-04 DIAGNOSIS — Z8781 Personal history of (healed) traumatic fracture: Secondary | ICD-10-CM | POA: Diagnosis not present

## 2016-01-04 DIAGNOSIS — S72141D Displaced intertrochanteric fracture of right femur, subsequent encounter for closed fracture with routine healing: Secondary | ICD-10-CM | POA: Diagnosis not present

## 2016-01-06 DIAGNOSIS — H3530 Unspecified macular degeneration: Secondary | ICD-10-CM | POA: Diagnosis not present

## 2016-01-06 DIAGNOSIS — Z859 Personal history of malignant neoplasm, unspecified: Secondary | ICD-10-CM | POA: Diagnosis not present

## 2016-01-06 DIAGNOSIS — S72141D Displaced intertrochanteric fracture of right femur, subsequent encounter for closed fracture with routine healing: Secondary | ICD-10-CM | POA: Diagnosis not present

## 2016-01-06 DIAGNOSIS — K219 Gastro-esophageal reflux disease without esophagitis: Secondary | ICD-10-CM | POA: Diagnosis not present

## 2016-01-06 DIAGNOSIS — F329 Major depressive disorder, single episode, unspecified: Secondary | ICD-10-CM | POA: Diagnosis not present

## 2016-01-06 DIAGNOSIS — Z9181 History of falling: Secondary | ICD-10-CM | POA: Diagnosis not present

## 2016-01-06 DIAGNOSIS — I10 Essential (primary) hypertension: Secondary | ICD-10-CM | POA: Diagnosis not present

## 2016-01-06 DIAGNOSIS — F419 Anxiety disorder, unspecified: Secondary | ICD-10-CM | POA: Diagnosis not present

## 2016-01-06 DIAGNOSIS — Z8781 Personal history of (healed) traumatic fracture: Secondary | ICD-10-CM | POA: Diagnosis not present

## 2016-01-11 DIAGNOSIS — H3530 Unspecified macular degeneration: Secondary | ICD-10-CM | POA: Diagnosis not present

## 2016-01-11 DIAGNOSIS — F419 Anxiety disorder, unspecified: Secondary | ICD-10-CM | POA: Diagnosis not present

## 2016-01-11 DIAGNOSIS — K219 Gastro-esophageal reflux disease without esophagitis: Secondary | ICD-10-CM | POA: Diagnosis not present

## 2016-01-11 DIAGNOSIS — Z859 Personal history of malignant neoplasm, unspecified: Secondary | ICD-10-CM | POA: Diagnosis not present

## 2016-01-11 DIAGNOSIS — Z8781 Personal history of (healed) traumatic fracture: Secondary | ICD-10-CM | POA: Diagnosis not present

## 2016-01-11 DIAGNOSIS — I10 Essential (primary) hypertension: Secondary | ICD-10-CM | POA: Diagnosis not present

## 2016-01-11 DIAGNOSIS — Z9181 History of falling: Secondary | ICD-10-CM | POA: Diagnosis not present

## 2016-01-11 DIAGNOSIS — S72141D Displaced intertrochanteric fracture of right femur, subsequent encounter for closed fracture with routine healing: Secondary | ICD-10-CM | POA: Diagnosis not present

## 2016-01-11 DIAGNOSIS — F329 Major depressive disorder, single episode, unspecified: Secondary | ICD-10-CM | POA: Diagnosis not present

## 2016-01-13 DIAGNOSIS — F419 Anxiety disorder, unspecified: Secondary | ICD-10-CM | POA: Diagnosis not present

## 2016-01-13 DIAGNOSIS — K219 Gastro-esophageal reflux disease without esophagitis: Secondary | ICD-10-CM | POA: Diagnosis not present

## 2016-01-13 DIAGNOSIS — Z8781 Personal history of (healed) traumatic fracture: Secondary | ICD-10-CM | POA: Diagnosis not present

## 2016-01-13 DIAGNOSIS — H3530 Unspecified macular degeneration: Secondary | ICD-10-CM | POA: Diagnosis not present

## 2016-01-13 DIAGNOSIS — F329 Major depressive disorder, single episode, unspecified: Secondary | ICD-10-CM | POA: Diagnosis not present

## 2016-01-13 DIAGNOSIS — Z9181 History of falling: Secondary | ICD-10-CM | POA: Diagnosis not present

## 2016-01-13 DIAGNOSIS — S72141D Displaced intertrochanteric fracture of right femur, subsequent encounter for closed fracture with routine healing: Secondary | ICD-10-CM | POA: Diagnosis not present

## 2016-01-13 DIAGNOSIS — I10 Essential (primary) hypertension: Secondary | ICD-10-CM | POA: Diagnosis not present

## 2016-01-13 DIAGNOSIS — Z859 Personal history of malignant neoplasm, unspecified: Secondary | ICD-10-CM | POA: Diagnosis not present

## 2016-01-14 DIAGNOSIS — F3342 Major depressive disorder, recurrent, in full remission: Secondary | ICD-10-CM | POA: Diagnosis not present

## 2016-01-14 DIAGNOSIS — S32000G Wedge compression fracture of unspecified lumbar vertebra, subsequent encounter for fracture with delayed healing: Secondary | ICD-10-CM | POA: Diagnosis not present

## 2016-01-14 DIAGNOSIS — M810 Age-related osteoporosis without current pathological fracture: Secondary | ICD-10-CM | POA: Diagnosis not present

## 2016-01-14 DIAGNOSIS — I10 Essential (primary) hypertension: Secondary | ICD-10-CM | POA: Diagnosis not present

## 2016-01-14 DIAGNOSIS — M1990 Unspecified osteoarthritis, unspecified site: Secondary | ICD-10-CM | POA: Diagnosis not present

## 2016-01-18 DIAGNOSIS — Z9181 History of falling: Secondary | ICD-10-CM | POA: Diagnosis not present

## 2016-01-18 DIAGNOSIS — Z859 Personal history of malignant neoplasm, unspecified: Secondary | ICD-10-CM | POA: Diagnosis not present

## 2016-01-18 DIAGNOSIS — Z8781 Personal history of (healed) traumatic fracture: Secondary | ICD-10-CM | POA: Diagnosis not present

## 2016-01-18 DIAGNOSIS — M4856XD Collapsed vertebra, not elsewhere classified, lumbar region, subsequent encounter for fracture with routine healing: Secondary | ICD-10-CM | POA: Diagnosis not present

## 2016-01-18 DIAGNOSIS — S72141D Displaced intertrochanteric fracture of right femur, subsequent encounter for closed fracture with routine healing: Secondary | ICD-10-CM | POA: Diagnosis not present

## 2016-01-18 DIAGNOSIS — I10 Essential (primary) hypertension: Secondary | ICD-10-CM | POA: Diagnosis not present

## 2016-01-18 DIAGNOSIS — H3530 Unspecified macular degeneration: Secondary | ICD-10-CM | POA: Diagnosis not present

## 2016-01-18 DIAGNOSIS — F329 Major depressive disorder, single episode, unspecified: Secondary | ICD-10-CM | POA: Diagnosis not present

## 2016-01-18 DIAGNOSIS — K219 Gastro-esophageal reflux disease without esophagitis: Secondary | ICD-10-CM | POA: Diagnosis not present

## 2016-01-18 DIAGNOSIS — F419 Anxiety disorder, unspecified: Secondary | ICD-10-CM | POA: Diagnosis not present

## 2016-01-20 DIAGNOSIS — H3530 Unspecified macular degeneration: Secondary | ICD-10-CM | POA: Diagnosis not present

## 2016-01-20 DIAGNOSIS — F329 Major depressive disorder, single episode, unspecified: Secondary | ICD-10-CM | POA: Diagnosis not present

## 2016-01-20 DIAGNOSIS — I10 Essential (primary) hypertension: Secondary | ICD-10-CM | POA: Diagnosis not present

## 2016-01-20 DIAGNOSIS — Z8781 Personal history of (healed) traumatic fracture: Secondary | ICD-10-CM | POA: Diagnosis not present

## 2016-01-20 DIAGNOSIS — Z9181 History of falling: Secondary | ICD-10-CM | POA: Diagnosis not present

## 2016-01-20 DIAGNOSIS — Z859 Personal history of malignant neoplasm, unspecified: Secondary | ICD-10-CM | POA: Diagnosis not present

## 2016-01-20 DIAGNOSIS — S72141D Displaced intertrochanteric fracture of right femur, subsequent encounter for closed fracture with routine healing: Secondary | ICD-10-CM | POA: Diagnosis not present

## 2016-01-20 DIAGNOSIS — K219 Gastro-esophageal reflux disease without esophagitis: Secondary | ICD-10-CM | POA: Diagnosis not present

## 2016-01-20 DIAGNOSIS — F419 Anxiety disorder, unspecified: Secondary | ICD-10-CM | POA: Diagnosis not present

## 2016-01-25 DIAGNOSIS — I10 Essential (primary) hypertension: Secondary | ICD-10-CM | POA: Diagnosis not present

## 2016-01-25 DIAGNOSIS — S72141D Displaced intertrochanteric fracture of right femur, subsequent encounter for closed fracture with routine healing: Secondary | ICD-10-CM | POA: Diagnosis not present

## 2016-01-25 DIAGNOSIS — Z8781 Personal history of (healed) traumatic fracture: Secondary | ICD-10-CM | POA: Diagnosis not present

## 2016-01-25 DIAGNOSIS — Z9181 History of falling: Secondary | ICD-10-CM | POA: Diagnosis not present

## 2016-01-25 DIAGNOSIS — H3530 Unspecified macular degeneration: Secondary | ICD-10-CM | POA: Diagnosis not present

## 2016-01-25 DIAGNOSIS — Z859 Personal history of malignant neoplasm, unspecified: Secondary | ICD-10-CM | POA: Diagnosis not present

## 2016-01-25 DIAGNOSIS — F419 Anxiety disorder, unspecified: Secondary | ICD-10-CM | POA: Diagnosis not present

## 2016-01-25 DIAGNOSIS — K219 Gastro-esophageal reflux disease without esophagitis: Secondary | ICD-10-CM | POA: Diagnosis not present

## 2016-01-25 DIAGNOSIS — F329 Major depressive disorder, single episode, unspecified: Secondary | ICD-10-CM | POA: Diagnosis not present

## 2016-01-27 DIAGNOSIS — Z859 Personal history of malignant neoplasm, unspecified: Secondary | ICD-10-CM | POA: Diagnosis not present

## 2016-01-27 DIAGNOSIS — Z8781 Personal history of (healed) traumatic fracture: Secondary | ICD-10-CM | POA: Diagnosis not present

## 2016-01-27 DIAGNOSIS — F419 Anxiety disorder, unspecified: Secondary | ICD-10-CM | POA: Diagnosis not present

## 2016-01-27 DIAGNOSIS — H3530 Unspecified macular degeneration: Secondary | ICD-10-CM | POA: Diagnosis not present

## 2016-01-27 DIAGNOSIS — Z9181 History of falling: Secondary | ICD-10-CM | POA: Diagnosis not present

## 2016-01-27 DIAGNOSIS — F329 Major depressive disorder, single episode, unspecified: Secondary | ICD-10-CM | POA: Diagnosis not present

## 2016-01-27 DIAGNOSIS — K219 Gastro-esophageal reflux disease without esophagitis: Secondary | ICD-10-CM | POA: Diagnosis not present

## 2016-01-27 DIAGNOSIS — S72141D Displaced intertrochanteric fracture of right femur, subsequent encounter for closed fracture with routine healing: Secondary | ICD-10-CM | POA: Diagnosis not present

## 2016-01-27 DIAGNOSIS — I10 Essential (primary) hypertension: Secondary | ICD-10-CM | POA: Diagnosis not present

## 2016-02-01 DIAGNOSIS — I10 Essential (primary) hypertension: Secondary | ICD-10-CM | POA: Diagnosis not present

## 2016-02-01 DIAGNOSIS — H3530 Unspecified macular degeneration: Secondary | ICD-10-CM | POA: Diagnosis not present

## 2016-02-01 DIAGNOSIS — F329 Major depressive disorder, single episode, unspecified: Secondary | ICD-10-CM | POA: Diagnosis not present

## 2016-02-01 DIAGNOSIS — Z859 Personal history of malignant neoplasm, unspecified: Secondary | ICD-10-CM | POA: Diagnosis not present

## 2016-02-01 DIAGNOSIS — Z9181 History of falling: Secondary | ICD-10-CM | POA: Diagnosis not present

## 2016-02-01 DIAGNOSIS — K219 Gastro-esophageal reflux disease without esophagitis: Secondary | ICD-10-CM | POA: Diagnosis not present

## 2016-02-01 DIAGNOSIS — Z8781 Personal history of (healed) traumatic fracture: Secondary | ICD-10-CM | POA: Diagnosis not present

## 2016-02-01 DIAGNOSIS — S72141D Displaced intertrochanteric fracture of right femur, subsequent encounter for closed fracture with routine healing: Secondary | ICD-10-CM | POA: Diagnosis not present

## 2016-02-01 DIAGNOSIS — F419 Anxiety disorder, unspecified: Secondary | ICD-10-CM | POA: Diagnosis not present

## 2016-02-09 ENCOUNTER — Ambulatory Visit: Payer: Commercial Managed Care - HMO | Admitting: Physical Therapy

## 2016-02-15 ENCOUNTER — Encounter: Payer: Commercial Managed Care - HMO | Admitting: Physical Therapy

## 2016-02-17 DIAGNOSIS — M810 Age-related osteoporosis without current pathological fracture: Secondary | ICD-10-CM | POA: Diagnosis not present

## 2016-02-17 DIAGNOSIS — K219 Gastro-esophageal reflux disease without esophagitis: Secondary | ICD-10-CM | POA: Diagnosis not present

## 2016-02-17 DIAGNOSIS — Z8781 Personal history of (healed) traumatic fracture: Secondary | ICD-10-CM | POA: Diagnosis not present

## 2016-02-17 DIAGNOSIS — I10 Essential (primary) hypertension: Secondary | ICD-10-CM | POA: Diagnosis not present

## 2016-02-17 DIAGNOSIS — F3342 Major depressive disorder, recurrent, in full remission: Secondary | ICD-10-CM | POA: Diagnosis not present

## 2016-02-17 DIAGNOSIS — M1990 Unspecified osteoarthritis, unspecified site: Secondary | ICD-10-CM | POA: Diagnosis not present

## 2016-02-17 DIAGNOSIS — S32000S Wedge compression fracture of unspecified lumbar vertebra, sequela: Secondary | ICD-10-CM | POA: Diagnosis not present

## 2016-02-18 ENCOUNTER — Encounter: Payer: Commercial Managed Care - HMO | Admitting: Physical Therapy

## 2016-02-23 ENCOUNTER — Encounter: Payer: Commercial Managed Care - HMO | Admitting: Physical Therapy

## 2016-02-25 ENCOUNTER — Encounter: Payer: Commercial Managed Care - HMO | Admitting: Physical Therapy

## 2016-03-01 ENCOUNTER — Encounter: Payer: Commercial Managed Care - HMO | Admitting: Physical Therapy

## 2016-03-02 DIAGNOSIS — S72141A Displaced intertrochanteric fracture of right femur, initial encounter for closed fracture: Secondary | ICD-10-CM | POA: Diagnosis not present

## 2016-03-03 ENCOUNTER — Encounter: Payer: Commercial Managed Care - HMO | Admitting: Physical Therapy

## 2016-03-08 ENCOUNTER — Encounter: Payer: Commercial Managed Care - HMO | Admitting: Physical Therapy

## 2016-03-09 ENCOUNTER — Encounter: Payer: Self-pay | Admitting: Physical Therapy

## 2016-03-09 ENCOUNTER — Ambulatory Visit: Payer: Commercial Managed Care - HMO | Attending: Internal Medicine | Admitting: Physical Therapy

## 2016-03-09 DIAGNOSIS — R262 Difficulty in walking, not elsewhere classified: Secondary | ICD-10-CM

## 2016-03-09 DIAGNOSIS — R2681 Unsteadiness on feet: Secondary | ICD-10-CM | POA: Insufficient documentation

## 2016-03-09 DIAGNOSIS — M6281 Muscle weakness (generalized): Secondary | ICD-10-CM | POA: Insufficient documentation

## 2016-03-09 NOTE — Therapy (Signed)
[place] [facility] [address] [place], [address] Phone: [phone]   Fax:  [phone]  Physical Therapy Evaluation  Patient Details  Name: April Rogers MRN: [id number] Date of Birth: 04-13-1941 Referring Provider: Caryl Comes  Encounter Date: 03/09/2016      PT End of Session - 03/09/16 1219    Visit Number 1   Number of Visits 13   Date for PT Re-Evaluation 04/06/16   Authorization Type 1/10   PT Start Time 1110   PT Stop Time 1210   PT Time Calculation (min) 60 min   Equipment Utilized During Treatment Gait belt   Activity Tolerance Patient tolerated treatment well   Behavior During Therapy [facility] for tasks assessed/performed      Past Medical History:  Diagnosis Date  . Anxiety   . Arthritis   . Cancer ([place])   . Depression   . Fracture of one rib   . GERD (gastroesophageal reflux disease)    was on nexium but no longer needs to take this medication  . Hypertension   . Macular degeneration of both eyes     Past Surgical History:  Procedure Laterality Date  . ARM SKIN LESION BIOPSY / EXCISION Right   . CHOLECYSTECTOMY    . COLONOSCOPY WITH PROPOFOL N/A 08/03/2015   Procedure: COLONOSCOPY WITH PROPOFOL;  Surgeon: Hulen Luster, MD;  Location: [facility] ENDOSCOPY;  Service: Gastroenterology;  Laterality: N/A;  . INTRAMEDULLARY (IM) NAIL INTERTROCHANTERIC Right 11/26/2015   Procedure: INTRAMEDULLARY (IM) NAIL INTERTROCHANTRIC;  Surgeon: Earnestine Leys, MD;  Location: [facility] ORS;  Service: Orthopedics;  Laterality: Right;  . TONSILLECTOMY    . TUBAL LIGATION    . WRIST RECONSTRUCTION Right     There were no vitals filed for this visit.       Subjective Assessment - 03/09/16 1216    Subjective Pt reports falling May, 15, 2017. She reports her back pain began when she was at [facility] for rehab for her broken R hip. When she got home she started having shooing pains into her R hip. She reports having a "spur at L1,  L2 vertebrae" which was causing shooting nerve pains. She denies n/t in RLE but reports intermittent lateral hip/groin radicular shooting pains. She reports using a back brace during the day sometimes when her "back feels tired" and has been using it for a few months. She cannot report any aggravating positions, but reports she is very cautious with her movements. Her fall in May was the last fall but she has had only 1 near fall since. She reports that her family wants her to get better balance and more strength which is why she is here for therapy.   Pertinent History fall in May which caused R hip fracture,    Limitations Lifting   How long can you sit comfortably? 45 mins   Patient Stated Goals to improve balance   Currently in Pain? No/denies            [facility] PT Assessment - 03/09/16 0001      Assessment   Medical Diagnosis Caryl Comes   Referring Provider Caryl Comes   Onset Date/Surgical Date 11/08/15   Prior Therapy yes- broken R hip     Precautions   Precautions None     Restrictions   Weight Bearing Restrictions No     Balance Screen   Has the patient fallen in the past 6 months Yes   How many times? 1  Has the patient had a decrease in activity level because of a fear of falling?  Yes   Is the patient reluctant to leave their home because of a fear of falling?  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 for tasks assessed     Balance   Balance Assessed Yes     Standardized Balance Assessment   Standardized Balance  Assessment 10 meter walk test;Five Times Sit to Stand;Dynamic Gait Index;Berg Balance Test   Five times sit to stand comments  22.6   10 Meter Walk 0.84m/s     Berg Balance Test   Sit to Stand Able to stand  independently using hands   Standing Unsupported Able to stand safely 2 minutes   Sitting with Back Unsupported but Feet Supported on Floor or Stool Able to sit safely and securely 2 minutes   Stand to Sit Controls descent by using hands   Transfers Able to transfer safely, definite need of hands   Standing Unsupported with Eyes Closed Able to stand 10 seconds safely   Standing Ubsupported with Feet Together Able to place feet together independently and stand 1 minute safely   From Standing, Reach Forward with Outstretched Arm Can reach forward >12 cm safely (5")   From Standing Position, Pick up Object from Floor Able to pick up shoe, needs supervision   From Standing Position, Turn to Look Behind Over each Shoulder Looks behind one side only/other side shows less weight shift   Turn 360 Degrees Able to turn 360 degrees safely but slowly   Standing Unsupported, Alternately Place Feet on Step/Stool Able to stand independently and safely and complete 8 steps in 20 seconds   Standing Unsupported, One Foot in Front Able to plae foot ahead of the other independently and hold 30 seconds   Standing on One Leg Able to lift leg independently and hold 5-10 seconds   Total Score 46     Dynamic Gait Index   Level Surface Normal   Change in Gait Speed Mild Impairment   Gait with Horizontal Head Turns Mild Impairment   Gait with Vertical Head Turns Mild Impairment   Gait and Pivot Turn Mild Impairment   Step Over Obstacle Normal   Step Around Obstacles Normal   Steps Mild Impairment   Total Score 19      AROM Bil hip ROM WNL  STRENGTH (on scale of 0-5/5)  Left Right  Hip flexion 4+ 4  Hip extension 3 3  Hip abduction Not tested 4  Knee flexion 4+ 4+  Knee extension 4+ 4+  Ankle DF  4+ 4+   GAIT Pt ambulates with and without SPC; bil decreased step and stride length, narrow BOS throughout  BALANCE Static and dynamic balance impaired as demonstrated by outcome measures above. TEST  Outcome  Interpretation   5 times sit<>stand  22.6 >60 yo, >15 sec indicates increased risk for falls; <60 yo, >10 sec indicates increased risk for falls  10 MWT 0.69 m/s Limited community  ambulator  Berg Balance Assessment  [phone] <36/56 (100% risk for falls), 37-45 (80% risk for falls); 46-51 (>50% risk for falls); 52-55 (lower risk <25% of falls)   DGI 19/24 Increased risk for falls          PT Education - 03/09/16 1219    Education provided Yes   Education Details exam findings, POC   Person(s) Educated Patient   Methods Explanation   Comprehension Verbalized understanding             PT Long Term Goals - 03/09/16 1228      PT LONG TERM GOAL #1   Title Pt will be independent with HEP to improve balance, strength, and overall function.    Time 6   Period Weeks   Status New     PT LONG TERM GOAL #2   Title pt will have improved 5xSTS time to <15 sec to demonstrate improved balance, strength, and overall function.   Time 6   Period Weeks   Status New     PT LONG TERM GOAL #3   Title pt will have improved Berg balance score by >9 points to demonstrate improved balance and decrease risk for falls.   Time 6   Period Weeks   Status New     PT LONG TERM GOAL #4   Title pt will have improved DGI score to >21/24 to demonstrate improved dynamic balance and decrese risk for falls.   Time 6   Period Weeks   Status New               Plan - 03/09/16 1219    Clinical Impression Statement Pt is 75 YO F who presents to therapy today with chronic history of falls and with recent onset of low back pain. Pt sustained R hip fracture in May from last fall which was surgically repaired and has completed therapy for. She states that her back pain has improved over the last  month but her mobility is still impaired as she has to take things slow, due to the R hip. Pt has deficits in BLE strength and demonstrates balace deficits with NBOS and dynamic gait. Pt needs skilled PT intervention to maximize overall function and decrease risk of falls.   Rehab Potential Fair   Clinical Impairments Affecting Rehab Potential chronic history of falls, R hip fraction May 2017, cognitive    PT Frequency 2x / week   PT Duration 6 weeks   PT Treatment/Interventions ADLs/Self Care Home Management;Gait training;Stair training;Functional mobility training;Therapeutic activities;Therapeutic exercise;Balance training;Neuromuscular re-education;Patient/family education;Manual techniques   PT Next Visit Plan balance, hip strengthening   Consulted and Agree with Plan of Care Patient      Patient will benefit from skilled therapeutic intervention in order to improve the following deficits and impairments:  Abnormal gait, Decreased balance, Decreased strength, Difficulty walking  Visit Diagnosis: Unsteadiness on feet  Difficulty in walking, not elsewhere classified  Muscle weakness (generalized)     Problem List Patient Active Problem List   Diagnosis Date Noted  . Femur fracture, right, closed, initial encounter 11/25/2015   Geraldine Solar, SPT  Geraldine Solar 03/09/2016, 12:38 PM  [place] [facility] [address], [place], [address] Phone: [phone]   Fax:  [phone]  Name: STONE VILLIARD MRN: [id number] Date of Birth: 1940-07-16

## 2016-03-10 ENCOUNTER — Encounter: Payer: Commercial Managed Care - HMO | Admitting: Physical Therapy

## 2016-03-16 ENCOUNTER — Encounter: Payer: Self-pay | Admitting: Physical Therapy

## 2016-03-16 ENCOUNTER — Ambulatory Visit: Payer: Commercial Managed Care - HMO | Attending: Internal Medicine | Admitting: Physical Therapy

## 2016-03-16 DIAGNOSIS — M6281 Muscle weakness (generalized): Secondary | ICD-10-CM | POA: Insufficient documentation

## 2016-03-16 DIAGNOSIS — R262 Difficulty in walking, not elsewhere classified: Secondary | ICD-10-CM | POA: Insufficient documentation

## 2016-03-16 DIAGNOSIS — R2681 Unsteadiness on feet: Secondary | ICD-10-CM | POA: Diagnosis not present

## 2016-03-16 NOTE — Therapy (Signed)
[place] [facility] [address] [place], [address] Phone: [phone]   Fax:  [phone]  Physical Therapy Treatment  Patient Details  Name: April Rogers MRN: [id number] Date of Birth: 08-Jan-1941 Referring Provider: Caryl Comes  Encounter Date: 03/16/2016      PT End of Session - 03/16/16 1045    Visit Number 2   Number of Visits 13   Date for PT Re-Evaluation 04/06/16   Authorization Type 2/10   PT Start Time 1041   PT Stop Time 1119   PT Time Calculation (min) 38 min   Equipment Utilized During Treatment Gait belt   Activity Tolerance Patient tolerated treatment well   Behavior During Therapy [facility] for tasks assessed/performed      Past Medical History:  Diagnosis Date  . Anxiety   . Arthritis   . Cancer ([place])   . Depression   . Fracture of one rib   . GERD (gastroesophageal reflux disease)    was on nexium but no longer needs to take this medication  . Hypertension   . Macular degeneration of both eyes     Past Surgical History:  Procedure Laterality Date  . ARM SKIN LESION BIOPSY / EXCISION Right   . CHOLECYSTECTOMY    . COLONOSCOPY WITH PROPOFOL N/A 08/03/2015   Procedure: COLONOSCOPY WITH PROPOFOL;  Surgeon: Hulen Luster, MD;  Location: [facility] ENDOSCOPY;  Service: Gastroenterology;  Laterality: N/A;  . INTRAMEDULLARY (IM) NAIL INTERTROCHANTERIC Right 11/26/2015   Procedure: INTRAMEDULLARY (IM) NAIL INTERTROCHANTRIC;  Surgeon: Earnestine Leys, MD;  Location: [facility] ORS;  Service: Orthopedics;  Laterality: Right;  . TONSILLECTOMY    . TUBAL LIGATION    . WRIST RECONSTRUCTION Right     There were no vitals filed for this visit.      Subjective Assessment - 03/16/16 1042    Subjective Pt reports she has been wearing her back brace more since last treatment session and states that "her back feels stronger." States she has not had any falls or near falls.    Pertinent History fall in May which caused R hip  fracture,    Limitations Lifting   How long can you sit comfortably? 45 mins   Patient Stated Goals to improve balance   Currently in Pain? No/denies     Therex: Bil standing hip abd with RTB, 3x10 each; min verbal cues for proper technique   Bil side stepping with RTB along edge of TM x 5 laps; min cues to decrease toe out  NMR: Cone taps on airex 3 cones x 10 each leg; cues for proper weight shifting  Bil SLS on firm surface 5x10 sec holds with no UE support; min cues for proper weight shifting  Bil  stance on airex with ball toss x 2 mins;        PT Education - 03/16/16 1044    Education provided Yes   Education Details exercise technique, HEP   Person(s) Educated Patient   Methods Explanation   Comprehension Verbalized understanding             PT Long Term Goals - 03/09/16 1228      PT LONG TERM GOAL #1   Title Pt will be independent with HEP to improve balance, strength, and overall function.    Time 6   Period Weeks   Status New     PT LONG TERM GOAL #2   Title pt will have improved 5xSTS time to <  15 sec to demonstrate improved balance, strength, and overall function.   Time 6   Period Weeks   Status New     PT LONG TERM GOAL #3   Title pt will have improved Berg balance score by >9 points to demonstrate improved balance and decrease risk for falls.   Time 6   Period Weeks   Status New     PT LONG TERM GOAL #4   Title pt will have improved DGI score to >21/24 to demonstrate improved dynamic balance and decrese risk for falls.   Time 6   Period Weeks   Status New               Plan - 03/16/16 1218    Clinical Impression Statement Pt tolerated hip strengthening and dynamic balance activites well this date. She demonstrated bil hip fatigue and increased difficulty when balance on LLE > RLE. Pt needs continued skilled PT intervention to maximize overall function and decrease risk of falls.   Rehab Potential Fair   Clinical Impairments  Affecting Rehab Potential chronic history of falls, R hip fraction May 2017, cognitive    PT Frequency 2x / week   PT Duration 6 weeks   PT Treatment/Interventions ADLs/Self Care Home Management;Gait training;Stair training;Functional mobility training;Therapeutic activities;Therapeutic exercise;Balance training;Neuromuscular re-education;Patient/family education;Manual techniques   PT Next Visit Plan balance, hip strengthening   Consulted and Agree with Plan of Care Patient      Patient will benefit from skilled therapeutic intervention in order to improve the following deficits and impairments:  Abnormal gait, Decreased balance, Decreased strength, Difficulty walking  Visit Diagnosis: Unsteadiness on feet  Difficulty in walking, not elsewhere classified  Muscle weakness (generalized)     Problem List Patient Active Problem List   Diagnosis Date Noted  . Femur fracture, right, closed, initial encounter 11/25/2015   Geraldine Solar, SPT  Geraldine Solar 03/16/2016, 12:19 PM  [place] [facility] [address] [place], [address] Phone: [phone]   Fax:  [phone]  Name: AHMIRAH WHITELEY MRN: [id number] Date of Birth: Dec 02, 1940

## 2016-03-16 NOTE — Patient Instructions (Signed)
[email or web address]  Standing hip abd and side stepping with RTB at counter, 3x10 each, 1x/day

## 2016-03-21 ENCOUNTER — Ambulatory Visit: Payer: Commercial Managed Care - HMO | Admitting: Physical Therapy

## 2016-03-21 DIAGNOSIS — M6281 Muscle weakness (generalized): Secondary | ICD-10-CM | POA: Diagnosis not present

## 2016-03-21 DIAGNOSIS — R262 Difficulty in walking, not elsewhere classified: Secondary | ICD-10-CM | POA: Diagnosis not present

## 2016-03-21 DIAGNOSIS — R2681 Unsteadiness on feet: Secondary | ICD-10-CM | POA: Diagnosis not present

## 2016-03-21 NOTE — Therapy (Signed)
[place] [facility] [address] [place], [address] Phone: [phone]   Fax:  [phone]  Physical Therapy Treatment  Patient Details  Name: April Rogers MRN: [id number] Date of Birth: 1941-03-29 Referring Provider: Caryl Comes  Encounter Date: 03/21/2016      PT End of Session - 03/21/16 1455    Visit Number 3   Number of Visits 13   Date for PT Re-Evaluation 04/06/16   Authorization Type 3/10   PT Start Time [id number]   PT Stop Time 1531   PT Time Calculation (min) 39 min   Activity Tolerance Patient tolerated treatment well   Behavior During Therapy [facility] for tasks assessed/performed      Past Medical History:  Diagnosis Date  . Anxiety   . Arthritis   . Cancer ([place])   . Depression   . Fracture of one rib   . GERD (gastroesophageal reflux disease)    was on nexium but no longer needs to take this medication  . Hypertension   . Macular degeneration of both eyes     Past Surgical History:  Procedure Laterality Date  . ARM SKIN LESION BIOPSY / EXCISION Right   . CHOLECYSTECTOMY    . COLONOSCOPY WITH PROPOFOL N/A 08/03/2015   Procedure: COLONOSCOPY WITH PROPOFOL;  Surgeon: Hulen Luster, MD;  Location: [facility] ENDOSCOPY;  Service: Gastroenterology;  Laterality: N/A;  . INTRAMEDULLARY (IM) NAIL INTERTROCHANTERIC Right 11/26/2015   Procedure: INTRAMEDULLARY (IM) NAIL INTERTROCHANTRIC;  Surgeon: Earnestine Leys, MD;  Location: [facility] ORS;  Service: Orthopedics;  Laterality: Right;  . TONSILLECTOMY    . TUBAL LIGATION    . WRIST RECONSTRUCTION Right     There were no vitals filed for this visit.      Subjective Assessment - 03/21/16 1453    Subjective Patient reports she may have overdone it over the weekend. She reports she was cooking more, cleaning more than normal. [place] reports she did her exercises x 3 days over the weekend, but feels wiped out as she has had diarrhea.    Pertinent History fall in May which caused R  hip fracture,    Limitations Lifting   How long can you sit comfortably? 45 mins   Patient Stated Goals to improve balance      TRX sit to stands x 12 repetitions , x 13 repetitions   Attempted sit to stands w/o use of UEs, unable to complete with feet even (she had staggered stance x 3 repetitions) with hands on thighs and PT foot blocking able to complete x 2 repetitions x 2 sets   Standing hip abductions x 12 on blue foam pad bilaterally x 2 sets with HHA   Side stepping at TM with  HHA and green t-band x 2 laps at a time for 3 sets (challenging)   Single leg deadlifts -- x 6 repetitions with 2 finger assist on LLE  For 2 sets. On RLE x 6 repetitions for 2 sets with bilateral HHA.   Step ups  With single HHA x 10 to 2 risers, added 3# DB in RUE x 10 repetitions on LLE only.                            PT Education - 03/21/16 1455    Education provided Yes   Education Details Will provide updated HEP in next session.    Person(s) Educated Patient   Methods  Explanation   Comprehension Verbalized understanding             PT Long Term Goals - 03/09/16 1228      PT LONG TERM GOAL #1   Title Pt will be independent with HEP to improve balance, strength, and overall function.    Time 6   Period Weeks   Status New     PT LONG TERM GOAL #2   Title pt will have improved 5xSTS time to <15 sec to demonstrate improved balance, strength, and overall function.   Time 6   Period Weeks   Status New     PT LONG TERM GOAL #3   Title pt will have improved Berg balance score by >9 points to demonstrate improved balance and decrease risk for falls.   Time 6   Period Weeks   Status New     PT LONG TERM GOAL #4   Title pt will have improved DGI score to >21/24 to demonstrate improved dynamic balance and decrese risk for falls.   Time 6   Period Weeks   Status New               Plan - 03/21/16 1455    Clinical Impression Statement Patient continues  to demonstrate Trendelenburg style SLS as well as difficulty performing sit to stand without use of UEs secondary to LE weakness. She is noted to fatigue in session today but does not have any pain with loaded activities provided in this session. She appears to be progressing well towards mobility goals, and would benefit from additional skilled therapy to address deficits in balance/strength.    Rehab Potential Fair   Clinical Impairments Affecting Rehab Potential chronic history of falls, R hip fraction May 2017, cognitive    PT Frequency 2x / week   PT Duration 6 weeks   PT Treatment/Interventions ADLs/Self Care Home Management;Gait training;Stair training;Functional mobility training;Therapeutic activities;Therapeutic exercise;Balance training;Neuromuscular re-education;Patient/family education;Manual techniques   PT Next Visit Plan balance, hip strengthening   Consulted and Agree with Plan of Care Patient      Patient will benefit from skilled therapeutic intervention in order to improve the following deficits and impairments:  Abnormal gait, Decreased balance, Decreased strength, Difficulty walking  Visit Diagnosis: Unsteadiness on feet  Difficulty in walking, not elsewhere classified  Muscle weakness (generalized)     Problem List Patient Active Problem List   Diagnosis Date Noted  . Femur fracture, right, closed, initial encounter 11/25/2015   Kerman Passey, PT, DPT    03/21/2016, 4:39 PM  [place] [facility] [address] [place], [address] Phone: [phone]   Fax:  [phone]  Name: April Rogers MRN: [id number] Date of Birth: [birth year]

## 2016-03-21 NOTE — Patient Instructions (Addendum)
TRX sit to stands x 12 repetitions , x 13 repetitions   Attempted sit to stands w/o use of UEs, unable to complete with feet even (she had staggered stance x 3 repetitions) with hands on thighs and PT foot blocking able to complete x 2 repetitions x 2 sets   Standing hip abductions x 12 on blue foam pad bilaterally x 2 sets with HHA   Side stepping at TM with  HHA and green t-band x 2 laps at a time for 3 sets (challenging)   Single leg deadlifts -- x 6 repetitions with 2 finger assist on LLE  For 2 sets. On RLE x 6 repetitions for 2 sets with bilateral HHA.   Step ups  With single HHA x 10 to 2 risers, added 3# DB in RUE x 10 repetitions on LLE only.

## 2016-03-23 ENCOUNTER — Ambulatory Visit: Payer: Commercial Managed Care - HMO | Admitting: Physical Therapy

## 2016-03-23 ENCOUNTER — Encounter: Payer: Self-pay | Admitting: Physical Therapy

## 2016-03-23 DIAGNOSIS — M6281 Muscle weakness (generalized): Secondary | ICD-10-CM

## 2016-03-23 DIAGNOSIS — R262 Difficulty in walking, not elsewhere classified: Secondary | ICD-10-CM | POA: Diagnosis not present

## 2016-03-23 DIAGNOSIS — R2681 Unsteadiness on feet: Secondary | ICD-10-CM

## 2016-03-23 NOTE — Therapy (Signed)
[place] [facility] [address] [place], [address] Phone: [phone]   Fax:  [phone]  Physical Therapy Treatment  Patient Details  Name: April Rogers MRN: [id number] Date of Birth: 10/01/1940 Referring Provider: Caryl Comes  Encounter Date: 03/23/2016      PT End of Session - 03/23/16 1738    Visit Number 4   Number of Visits 13   Date for PT Re-Evaluation 04/06/16   Authorization Type 3/10   PT Start Time [id number]   PT Stop Time 1430   PT Time Calculation (min) 35 min   Activity Tolerance Patient tolerated treatment well   Behavior During Therapy [place] for tasks assessed/performed      Past Medical History:  Diagnosis Date  . Anxiety   . Arthritis   . Cancer ([place])   . Depression   . Fracture of one rib   . GERD (gastroesophageal reflux disease)    was on nexium but no longer needs to take this medication  . Hypertension   . Macular degeneration of both eyes     Past Surgical History:  Procedure Laterality Date  . ARM SKIN LESION BIOPSY / EXCISION Right   . CHOLECYSTECTOMY    . COLONOSCOPY WITH PROPOFOL N/A 08/03/2015   Procedure: COLONOSCOPY WITH PROPOFOL;  Surgeon: Hulen Luster, MD;  Location: [facility] ENDOSCOPY;  Service: Gastroenterology;  Laterality: N/A;  . INTRAMEDULLARY (IM) NAIL INTERTROCHANTERIC Right 11/26/2015   Procedure: INTRAMEDULLARY (IM) NAIL INTERTROCHANTRIC;  Surgeon: Earnestine Leys, MD;  Location: [facility] ORS;  Service: Orthopedics;  Laterality: Right;  . TONSILLECTOMY    . TUBAL LIGATION    . WRIST RECONSTRUCTION Right     There were no vitals filed for this visit.      Subjective Assessment - 03/23/16 1357    Subjective Pt reports she is sore this date from a combination of last treatment session and performing her HEP. She reports she did not sleep well last night and is tired today.   Pertinent History fall in May which caused R hip fracture,    Limitations Lifting   How long can you  sit comfortably? 45 mins   Patient Stated Goals to improve balance   Currently in Pain? No/denies     Bil fwd/lat step ups with 1 riser under step, 2x10 for fwd, 1x10 for lateral  Mini squats with TRX and chair behind pt for external cuing for proper technique, 3x10 ; min verbal cues for proper technique  Bil SLS on airex with chops with RTB, 2x10 each; min verbal cues for proper technique, CGA for balance  Wall sits with TB 5x10 sec holds; min verbal cues for proper technique       PT Education - 03/23/16 1736    Education provided Yes   Education Details exercise technique; continue current HEP, will give updated HEP next session   Person(s) Educated Patient   Methods Explanation   Comprehension Verbalized understanding             PT Long Term Goals - 03/09/16 1228      PT LONG TERM GOAL #1   Title Pt will be independent with HEP to improve balance, strength, and overall function.    Time 6   Period Weeks   Status New     PT LONG TERM GOAL #2   Title pt will have improved 5xSTS time to <15 sec to demonstrate improved balance, strength, and overall function.   Time  6   Period Weeks   Status New     PT LONG TERM GOAL #3   Title pt will have improved Berg balance score by >9 points to demonstrate improved balance and decrease risk for falls.   Time 6   Period Weeks   Status New     PT LONG TERM GOAL #4   Title pt will have improved DGI score to >21/24 to demonstrate improved dynamic balance and decrese risk for falls.   Time 6   Period Weeks   Status New               Plan - 03/23/16 1738    Clinical Impression Statement Pt reported to therapy this date with c/o feeling down and "having a tough morning." Pt noticeably emotional during treatment session and had crying spell during exercises. SPT sat pt down and pt expressed that she was having increased stress due to issues with family and friends. After about 10 minutes, pt wanted to continue with  exercising. The pt tolerated hip and core strengthening as well as balance exercises well this date. Pt reported feeling like she has improved some since beginning therapy and needs continued PT to maximize overall function and decrease risk of falls.   Rehab Potential Fair   Clinical Impairments Affecting Rehab Potential chronic history of falls, R hip fraction May 2017, cognitive    PT Frequency 2x / week   PT Duration 6 weeks   PT Treatment/Interventions ADLs/Self Care Home Management;Gait training;Stair training;Functional mobility training;Therapeutic activities;Therapeutic exercise;Balance training;Neuromuscular re-education;Patient/family education;Manual techniques   PT Next Visit Plan balance, hip strengthening   Consulted and Agree with Plan of Care Patient      Patient will benefit from skilled therapeutic intervention in order to improve the following deficits and impairments:  Abnormal gait, Decreased balance, Decreased strength, Difficulty walking  Visit Diagnosis: Unsteadiness on feet  Difficulty in walking, not elsewhere classified  Muscle weakness (generalized)     Problem List Patient Active Problem List   Diagnosis Date Noted  . Femur fracture, right, closed, initial encounter 11/25/2015   Geraldine Solar, SPT  Geraldine Solar 03/23/2016, 5:40 PM  [place] [facility] [address], [place], [address] Phone: [phone]   Fax:  [phone]  Name: EVOLETTE LASSWELL MRN: [id number] Date of Birth: 10-Sep-1940

## 2016-03-28 ENCOUNTER — Ambulatory Visit: Payer: Commercial Managed Care - HMO | Admitting: Physical Therapy

## 2016-03-28 ENCOUNTER — Encounter: Payer: Self-pay | Admitting: Physical Therapy

## 2016-03-28 DIAGNOSIS — R262 Difficulty in walking, not elsewhere classified: Secondary | ICD-10-CM | POA: Diagnosis not present

## 2016-03-28 DIAGNOSIS — R2681 Unsteadiness on feet: Secondary | ICD-10-CM

## 2016-03-28 DIAGNOSIS — M6281 Muscle weakness (generalized): Secondary | ICD-10-CM

## 2016-03-28 NOTE — Therapy (Signed)
[place] [facility] [address], [place], [address] Phone: [phone]   Fax:  [phone]  Physical Therapy Treatment  Patient Details  Name: April Rogers MRN: [id number] Date of Birth: [birth year] Referring Provider: Caryl Comes  Encounter Date: 03/28/2016      PT End of Session - 03/28/16 1514    Visit Number 5   Number of Visits 13   Date for PT Re-Evaluation 04/06/16   Authorization Type 3/10   PT Start Time 1442   PT Stop Time 1515   PT Time Calculation (min) 33 min   Activity Tolerance Patient tolerated treatment well   Behavior During Therapy [facility] for tasks assessed/performed      Past Medical History:  Diagnosis Date  . Anxiety   . Arthritis   . Cancer ([place])   . Depression   . Fracture of one rib   . GERD (gastroesophageal reflux disease)    was on nexium but no longer needs to take this medication  . Hypertension   . Macular degeneration of both eyes     Past Surgical History:  Procedure Laterality Date  . ARM SKIN LESION BIOPSY / EXCISION Right   . CHOLECYSTECTOMY    . COLONOSCOPY WITH PROPOFOL N/A 08/03/2015   Procedure: COLONOSCOPY WITH PROPOFOL;  Surgeon: Hulen Luster, MD;  Location: [facility] ENDOSCOPY;  Service: Gastroenterology;  Laterality: N/A;  . INTRAMEDULLARY (IM) NAIL INTERTROCHANTERIC Right 11/26/2015   Procedure: INTRAMEDULLARY (IM) NAIL INTERTROCHANTRIC;  Surgeon: Earnestine Leys, MD;  Location: [facility] ORS;  Service: Orthopedics;  Laterality: Right;  . TONSILLECTOMY    . TUBAL LIGATION    . WRIST RECONSTRUCTION Right     There were no vitals filed for this visit.      Subjective Assessment - 03/28/16 1520    Subjective Pt reports she is sore this date because she "didn't take Ibuprofen" today.   Pertinent History fall in May which caused R hip fracture,    Limitations Lifting   How long can you sit comfortably? 45 mins   Patient Stated Goals to improve balance   Currently in Pain?  No/denies            [facility] PT Assessment - 03/28/16 0001      Dynamic Gait Index   Level Surface Normal   Change in Gait Speed Mild Impairment   Gait with Horizontal Head Turns Mild Impairment   Gait with Vertical Head Turns Mild Impairment   Gait and Pivot Turn Mild Impairment   Step Over Obstacle Normal   Step Around Obstacles Normal   Steps Mild Impairment   Total Score 19           PT Education - 03/28/16 1512    Education provided Yes   Education Details exercise technique, will perform EC balance exercises next session   Person(s) Educated Patient   Methods Explanation   Comprehension Verbalized understanding      Resisted lateral walking with RTB at TM x 7 laps  Bil star drill while on airex x 5 each; increased difficulty when on RLE; CGA - min A for balance throughout; pr reported this exercise as being "hard and challenging"  Bil SLS on airex with circles in x10 and circles out x 10 on BLE with FMS dowel and 2# DB; pt had increased difficulty balancing on RLE  Ball toss with lateral walking 4x75ft; pt had 1 LOB during which required min A to recover from. Pt off  balance throughout which required CGA - min A throughout        PT Long Term Goals - 03/09/16 1228      PT LONG TERM GOAL #1   Title Pt will be independent with HEP to improve balance, strength, and overall function.    Time 6   Period Weeks   Status New     PT LONG TERM GOAL #2   Title pt will have improved 5xSTS time to <15 sec to demonstrate improved balance, strength, and overall function.   Time 6   Period Weeks   Status New     PT LONG TERM GOAL #3   Title pt will have improved Berg balance score by >9 points to demonstrate improved balance and decrease risk for falls.   Time 6   Period Weeks   Status New     PT LONG TERM GOAL #4   Title pt will have improved DGI score to >21/24 to demonstrate improved dynamic balance and decrese risk for falls.   Time 6   Period Weeks    Status New               Plan - 03/28/16 1516    Clinical Impression Statement Treatment session limited due to pt being late to appointment. Pt tolerated progressed balance training well. She had 1 LOB during ball toss and lateral walking which required min A to recover from. Pt DGI score remained the same from initial eval. Pt needs continued skilled PT intervention to maximize overall function and decrease risk of falls.   Rehab Potential Fair   Clinical Impairments Affecting Rehab Potential chronic history of falls, R hip fraction May 2017, cognitive    PT Frequency 2x / week   PT Duration 6 weeks   PT Treatment/Interventions ADLs/Self Care Home Management;Gait training;Stair training;Functional mobility training;Therapeutic activities;Therapeutic exercise;Balance training;Neuromuscular re-education;Patient/family education;Manual techniques   PT Next Visit Plan balance, hip strengthening   Consulted and Agree with Plan of Care Patient      Patient will benefit from skilled therapeutic intervention in order to improve the following deficits and impairments:  Abnormal gait, Decreased balance, Decreased strength, Difficulty walking  Visit Diagnosis: No diagnosis found.     Problem List Patient Active Problem List   Diagnosis Date Noted  . Femur fracture, right, closed, initial encounter 11/25/2015   Geraldine Solar, SPT  Geraldine Solar 03/28/2016, 3:20 PM  [place] [facility] [address] [place], [address] Phone: [phone]   Fax:  [phone]  Name: April Rogers MRN: [id number] Date of Birth: [birth year]

## 2016-03-30 ENCOUNTER — Ambulatory Visit: Payer: Commercial Managed Care - HMO | Admitting: Physical Therapy

## 2016-03-30 ENCOUNTER — Encounter: Payer: Self-pay | Admitting: Physical Therapy

## 2016-03-30 DIAGNOSIS — M6281 Muscle weakness (generalized): Secondary | ICD-10-CM

## 2016-03-30 DIAGNOSIS — R2681 Unsteadiness on feet: Secondary | ICD-10-CM | POA: Diagnosis not present

## 2016-03-30 DIAGNOSIS — R262 Difficulty in walking, not elsewhere classified: Secondary | ICD-10-CM

## 2016-03-30 NOTE — Therapy (Signed)
[facility] [address] [place], [address] Phone: [phone]   Fax:  [phone]  Physical Therapy Treatment  Patient Details  Name: April Rogers MRN: [id number] Date of Birth: December 15, 1940 Referring Provider: Caryl Comes  Encounter Date: 03/30/2016      PT End of Session - 03/30/16 1441    Visit Number 6   Number of Visits 13   Date for PT Re-Evaluation 04/06/16   Authorization Type 3/10   PT Start Time 1430   PT Stop Time 1515   PT Time Calculation (min) 45 min   Activity Tolerance Patient tolerated treatment well   Behavior During Therapy [facility] for tasks assessed/performed      Past Medical History:  Diagnosis Date  . Anxiety   . Arthritis   . Cancer ([place])   . Depression   . Fracture of one rib   . GERD (gastroesophageal reflux disease)    was on nexium but no longer needs to take this medication  . Hypertension   . Macular degeneration of both eyes     Past Surgical History:  Procedure Laterality Date  . ARM SKIN LESION BIOPSY / EXCISION Right   . CHOLECYSTECTOMY    . COLONOSCOPY WITH PROPOFOL N/A 08/03/2015   Procedure: COLONOSCOPY WITH PROPOFOL;  Surgeon: Hulen Luster, MD;  [facility] ENDOSCOPY;  Service: Gastroenterology;  Laterality: N/A;  . INTRAMEDULLARY (IM) NAIL INTERTROCHANTERIC Right 11/26/2015   Procedure: INTRAMEDULLARY (IM) NAIL INTERTROCHANTRIC;  Surgeon: Earnestine Leys, MD;  Location: [facility] ORS;  Service: Orthopedics;  Laterality: Right;  . TONSILLECTOMY    . TUBAL LIGATION    . WRIST RECONSTRUCTION Right     There were no vitals filed for this visit.      Subjective Assessment - 03/30/16 1440    Subjective Pt reports she feels better this date, but was still a little sore following last treatment session.   Pertinent History fall in May which caused R hip fracture,    Limitations Lifting   How long can you sit comfortably? 45 mins   Patient Stated Goals to improve balance    Currently in Pain? No/denies       [place] at TM, x1 lap with BUE fingertip support, x1 lap with 1 UE fingertip support; x 1 lap with no UE support; x 2 laps with EC and no UE support; pt had increased difficulty with EC but did not have LOB and demonstrated good control throughout  Sit <> stand with 5# DB, 3x5; pt demo'd fatigue throughout exercise and educated to add this exercise to HEP  Bil staggered stance on airex with EC, 10x10 sec bouts; increased A/P sway with RLE back  Attempted SLS on airex with trunk rotations but too difficult for pt to perform as she had difficulty maintaining balance so had pt perform bil staggered stance with front foot heel touching only, performing trunk rotations while holding 3# DB, 2x5 rotations with each leg back; increased difficulty with RLE back  Sitting on physioball with BLE on ground and paloff press with RTB, 2x10 each direction  Bil birddog while sitting on physioball with EC, 2x10; pt had difficulty maintaining steadiness on ball throughout exercise and required CGA - min A for balance       PT Education - 03/30/16 1440    Education provided Yes   Education Details exercise technique, add weighted sit <> stands to [employer]) Educated Patient   Methods Explanation  Comprehension Verbalized understanding             PT Long Term Goals - 03/09/16 1228      PT LONG TERM GOAL #1   Title Pt will be independent with HEP to improve balance, strength, and overall function.    Time 6   Period Weeks   Status New     PT LONG TERM GOAL #2   Title pt will have improved 5xSTS time to <15 sec to demonstrate improved balance, strength, and overall function.   Time 6   Period Weeks   Status New     PT LONG TERM GOAL #3   Title pt will have improved Berg balance score by >9 points to demonstrate improved balance and decrease risk for falls.   Time 6   Period Weeks   Status New     PT LONG TERM GOAL #4   Title pt will have  improved DGI score to >21/24 to demonstrate improved dynamic balance and decrese risk for falls.   Time 6   Period Weeks   Status New               Plan - 03/30/16 1533    Clinical Impression Statement Pt continuing to make progress towards goals. She tolerated progressed balance and strength training well this date. Her dynamic balance is her main limiting factor which was addressed this date. Pt needs continued skilled PT intervention to maximize overall function and decrease risk for falls.   Rehab Potential Fair   Clinical Impairments Affecting Rehab Potential chronic history of falls, R hip fraction May 2017, cognitive    PT Frequency 2x / week   PT Duration 6 weeks   PT Treatment/Interventions ADLs/Self Care Home Management;Gait training;Stair training;Functional mobility training;Therapeutic activities;Therapeutic exercise;Balance training;Neuromuscular re-education;Patient/family education;Manual techniques   PT Next Visit Plan balance, hip strengthening   Consulted and Agree with Plan of Care Patient      Patient will benefit from skilled therapeutic intervention in order to improve the following deficits and impairments:  Abnormal gait, Decreased balance, Decreased strength, Difficulty walking  Visit Diagnosis: Unsteadiness on feet  Difficulty in walking, not elsewhere classified  Muscle weakness (generalized)     Problem List Patient Active Problem List   Diagnosis Date Noted  . Femur fracture, right, closed, initial encounter 11/25/2015   Geraldine Solar, SPT  Geraldine Solar 03/30/2016, 3:35 PM  [place] [address] [place], [address] Phone: [phone]   Fax:  [phone]  Name: CHERAMIE LINNE MRN: [id number] Date of Birth: 1940-08-22

## 2016-04-04 ENCOUNTER — Ambulatory Visit: Payer: Commercial Managed Care - HMO | Admitting: Physical Therapy

## 2016-04-06 ENCOUNTER — Ambulatory Visit: Payer: Commercial Managed Care - HMO | Admitting: Physical Therapy

## 2016-04-06 ENCOUNTER — Encounter: Payer: Self-pay | Admitting: Physical Therapy

## 2016-04-06 DIAGNOSIS — M6281 Muscle weakness (generalized): Secondary | ICD-10-CM | POA: Diagnosis not present

## 2016-04-06 DIAGNOSIS — R2681 Unsteadiness on feet: Secondary | ICD-10-CM

## 2016-04-06 DIAGNOSIS — R262 Difficulty in walking, not elsewhere classified: Secondary | ICD-10-CM | POA: Diagnosis not present

## 2016-04-06 NOTE — Therapy (Signed)
[place] [facility] [address] [place], [address] Phone: [phone]   Fax:  [phone]  Physical Therapy Treatment/Discharge Summary  Patient Details  Name: April Rogers MRN: [id number] Date of Birth: [birth year] Referring Provider: Caryl Comes  Encounter Date: 04/06/2016      PT End of Session - 04/06/16 1454    Visit Number 7   Number of Visits 13   Date for PT Re-Evaluation 04/06/16   Authorization Type 7/10   PT Start Time 1430   PT Stop Time 1502   PT Time Calculation (min) 32 min   Activity Tolerance Patient tolerated treatment well   Behavior During Therapy [facility] for tasks assessed/performed      Past Medical History:  Diagnosis Date  . Anxiety   . Arthritis   . Cancer ([place])   . Depression   . Fracture of one rib   . GERD (gastroesophageal reflux disease)    was on nexium but no longer needs to take this medication  . Hypertension   . Macular degeneration of both eyes     Past Surgical History:  Procedure Laterality Date  . ARM SKIN LESION BIOPSY / EXCISION Right   . CHOLECYSTECTOMY    . COLONOSCOPY WITH PROPOFOL N/A 08/03/2015   Procedure: COLONOSCOPY WITH PROPOFOL;  Surgeon: Hulen Luster, MD;  Location: [facility] ENDOSCOPY;  Service: Gastroenterology;  Laterality: N/A;  . INTRAMEDULLARY (IM) NAIL INTERTROCHANTERIC Right 11/26/2015   Procedure: INTRAMEDULLARY (IM) NAIL INTERTROCHANTRIC;  Surgeon: Earnestine Leys, MD;  Location: [facility] ORS;  Service: Orthopedics;  Laterality: Right;  . TONSILLECTOMY    . TUBAL LIGATION    . WRIST RECONSTRUCTION Right     There were no vitals filed for this visit.      Subjective Assessment - 04/06/16 1515    Subjective Pt reports she was able to walk and be on her feet for multiple hours yesterday with no pain, LOB, or fear of falling.   Pertinent History fall in May which caused R hip fracture,    Limitations Lifting   How long can you sit comfortably? 45 mins    Patient Stated Goals to improve balance   Currently in Pain? No/denies       SPT reassessed outcome measures as follows:     [facility] PT Assessment - 04/06/16 0001      Berg Balance Test   Sit to Stand Able to stand without using hands and stabilize independently   Standing Unsupported Able to stand safely 2 minutes   Sitting with Back Unsupported but Feet Supported on Floor or Stool Able to sit safely and securely 2 minutes   Stand to Sit Sits safely with minimal use of hands   Transfers Able to transfer safely, minor use of hands   Standing Unsupported with Eyes Closed Able to stand 10 seconds safely   Standing Ubsupported with Feet Together Able to place feet together independently and stand 1 minute safely   From Standing, Reach Forward with Outstretched Arm Can reach forward >12 cm safely (5")   From Standing Position, Pick up Object from Floor Able to pick up shoe safely and easily   From Standing Position, Turn to Look Behind Over each Shoulder Looks behind from both sides and weight shifts well   Turn 360 Degrees Able to turn 360 degrees safely in 4 seconds or less   Standing Unsupported, Alternately Place Feet on Step/Stool Able to stand independently and safely and complete 8  steps in 20 seconds   Standing Unsupported, One Foot in [place] to plae foot ahead of the other independently and hold 30 seconds   Standing on One Leg Able to lift leg independently and hold 5-10 seconds   Total Score 53     Dynamic Gait Index   Level Surface Normal   Change in Gait Speed Normal   Gait with Horizontal Head Turns Normal   Gait with Vertical Head Turns Mild Impairment   Gait and Pivot Turn Mild Impairment   Step Over Obstacle Normal   Step Around Obstacles Normal   Steps Normal   Total Score 22     DGI: 22/24; dynamic balance WNL  Berg: 53/56; static balance WNL  Given updated HEP - continue current exercises and add: Weighted sit <> stands Mini squats Bil SLS  stance Resisted lateral walking with GTB      PT Education - 04/06/16 1516    Education provided Yes   Education Details discharge, progress, HEP   Person(s) Educated Patient   Methods Explanation   Comprehension Verbalized understanding             PT Long Term Goals - 04/06/16 1520      PT LONG TERM GOAL #1   Title Pt will be independent with HEP to improve balance, strength, and overall function.    Time 6   Period Weeks   Status Achieved     PT LONG TERM GOAL #2   Title pt will have improved 5xSTS time to <15 sec to demonstrate improved balance, strength, and overall function.   Time 6   Period Weeks   Status On-going     PT LONG TERM GOAL #3   Title pt will have improved Berg balance score by >9 points to demonstrate improved balance and decrease risk for falls.   Time 6   Period Weeks   Status Achieved     PT LONG TERM GOAL #4   Title pt will have improved DGI score to >21/24 to demonstrate improved dynamic balance and decrese risk for falls.   Time 6   Period Weeks   Status Achieved               Plan - 04/06/16 1517    Clinical Impression Statement Pt has made significant improvements since beginning therapy. She is at a low fall risk based on her Merrilee Jansky and DGI performance. She reported being able to ambulate in the community for multiple hours yesterday without the need for rest breaks. She reports that she feels significantly better since starting therapy and feels that she does not need to come back. Pt has met 3 out of 4 goals and due to her significant functional improvement, will be discharged at this time. She was given a progressed HEP in order to maintain functional gains. Pt educated that if she has a decline in function within the next few months that she can return to therapy with referral.   Rehab Potential Fair   Clinical Impairments Affecting Rehab Potential chronic history of falls, R hip fraction May 2017, cognitive    PT Frequency 2x  / week   PT Duration 6 weeks   PT Treatment/Interventions ADLs/Self Care Home Management;Gait training;Stair training;Functional mobility training;Therapeutic activities;Therapeutic exercise;Balance training;Neuromuscular re-education;Patient/family education;Manual techniques   PT Next Visit Plan balance, hip strengthening   Consulted and Agree with Plan of Care Patient      Patient will benefit from skilled therapeutic intervention in order  to improve the following deficits and impairments:  Abnormal gait, Decreased balance, Decreased strength, Difficulty walking  Visit Diagnosis: Unsteadiness on feet  Difficulty in walking, not elsewhere classified  Muscle weakness (generalized)     Problem List Patient Active Problem List   Diagnosis Date Noted  . Femur fracture, right, closed, initial encounter 11/25/2015   Geraldine Solar, SPT  Geraldine Solar 04/06/2016, 3:21 PM  [facility] [address] [place], [address] Phone: [phone]   Fax:  [phone]  Name: April Rogers MRN: [id number] Date of Birth: 14-Sep-1940

## 2016-04-06 NOTE — Patient Instructions (Signed)
[email or web address]  Weighted sit <> stands Mini squats Bil SLS stance Resisted lateral walking with GTB

## 2016-04-28 DIAGNOSIS — H26491 Other secondary cataract, right eye: Secondary | ICD-10-CM | POA: Diagnosis not present

## 2016-05-05 DIAGNOSIS — H353222 Exudative age-related macular degeneration, left eye, with inactive choroidal neovascularization: Secondary | ICD-10-CM | POA: Diagnosis not present

## 2016-05-13 DIAGNOSIS — H353222 Exudative age-related macular degeneration, left eye, with inactive choroidal neovascularization: Secondary | ICD-10-CM | POA: Diagnosis not present

## 2016-06-16 DIAGNOSIS — H353222 Exudative age-related macular degeneration, left eye, with inactive choroidal neovascularization: Secondary | ICD-10-CM | POA: Diagnosis not present

## 2016-06-20 DIAGNOSIS — K219 Gastro-esophageal reflux disease without esophagitis: Secondary | ICD-10-CM | POA: Diagnosis not present

## 2016-06-20 DIAGNOSIS — M1990 Unspecified osteoarthritis, unspecified site: Secondary | ICD-10-CM | POA: Diagnosis not present

## 2016-06-20 DIAGNOSIS — I10 Essential (primary) hypertension: Secondary | ICD-10-CM | POA: Diagnosis not present

## 2016-06-24 DIAGNOSIS — S72141A Displaced intertrochanteric fracture of right femur, initial encounter for closed fracture: Secondary | ICD-10-CM | POA: Diagnosis not present

## 2016-06-24 DIAGNOSIS — S72141D Displaced intertrochanteric fracture of right femur, subsequent encounter for closed fracture with routine healing: Secondary | ICD-10-CM | POA: Diagnosis not present

## 2016-06-24 DIAGNOSIS — M4856XD Collapsed vertebra, not elsewhere classified, lumbar region, subsequent encounter for fracture with routine healing: Secondary | ICD-10-CM | POA: Diagnosis not present

## 2016-06-28 ENCOUNTER — Other Ambulatory Visit: Payer: Self-pay | Admitting: Internal Medicine

## 2016-06-28 DIAGNOSIS — Z1231 Encounter for screening mammogram for malignant neoplasm of breast: Secondary | ICD-10-CM | POA: Diagnosis not present

## 2016-06-28 DIAGNOSIS — K219 Gastro-esophageal reflux disease without esophagitis: Secondary | ICD-10-CM | POA: Diagnosis not present

## 2016-06-28 DIAGNOSIS — I10 Essential (primary) hypertension: Secondary | ICD-10-CM | POA: Diagnosis not present

## 2016-06-28 DIAGNOSIS — S32010S Wedge compression fracture of first lumbar vertebra, sequela: Secondary | ICD-10-CM | POA: Diagnosis not present

## 2016-06-28 DIAGNOSIS — M810 Age-related osteoporosis without current pathological fracture: Secondary | ICD-10-CM | POA: Diagnosis not present

## 2016-06-28 DIAGNOSIS — F3342 Major depressive disorder, recurrent, in full remission: Secondary | ICD-10-CM | POA: Diagnosis not present

## 2016-06-28 DIAGNOSIS — M1990 Unspecified osteoarthritis, unspecified site: Secondary | ICD-10-CM | POA: Diagnosis not present

## 2016-06-28 DIAGNOSIS — Z0000 Encounter for general adult medical examination without abnormal findings: Secondary | ICD-10-CM | POA: Diagnosis not present

## 2016-07-19 DIAGNOSIS — I10 Essential (primary) hypertension: Secondary | ICD-10-CM | POA: Diagnosis not present

## 2016-07-19 DIAGNOSIS — S32010S Wedge compression fracture of first lumbar vertebra, sequela: Secondary | ICD-10-CM | POA: Diagnosis not present

## 2016-07-19 DIAGNOSIS — F3342 Major depressive disorder, recurrent, in full remission: Secondary | ICD-10-CM | POA: Diagnosis not present

## 2016-07-19 DIAGNOSIS — M810 Age-related osteoporosis without current pathological fracture: Secondary | ICD-10-CM | POA: Diagnosis not present

## 2016-07-19 DIAGNOSIS — K219 Gastro-esophageal reflux disease without esophagitis: Secondary | ICD-10-CM | POA: Diagnosis not present

## 2016-07-19 DIAGNOSIS — Z791 Long term (current) use of non-steroidal anti-inflammatories (NSAID): Secondary | ICD-10-CM | POA: Diagnosis not present

## 2016-07-20 DIAGNOSIS — H353222 Exudative age-related macular degeneration, left eye, with inactive choroidal neovascularization: Secondary | ICD-10-CM | POA: Diagnosis not present

## 2016-07-28 ENCOUNTER — Ambulatory Visit: Payer: Commercial Managed Care - HMO

## 2016-08-01 DIAGNOSIS — M810 Age-related osteoporosis without current pathological fracture: Secondary | ICD-10-CM | POA: Diagnosis not present

## 2016-08-09 ENCOUNTER — Ambulatory Visit: Payer: Commercial Managed Care - HMO

## 2016-08-18 DIAGNOSIS — H353222 Exudative age-related macular degeneration, left eye, with inactive choroidal neovascularization: Secondary | ICD-10-CM | POA: Diagnosis not present

## 2016-09-06 DIAGNOSIS — F17200 Nicotine dependence, unspecified, uncomplicated: Secondary | ICD-10-CM | POA: Diagnosis not present

## 2016-09-06 DIAGNOSIS — Z8781 Personal history of (healed) traumatic fracture: Secondary | ICD-10-CM | POA: Diagnosis not present

## 2016-09-06 DIAGNOSIS — M810 Age-related osteoporosis without current pathological fracture: Secondary | ICD-10-CM | POA: Diagnosis not present

## 2016-09-14 DIAGNOSIS — M810 Age-related osteoporosis without current pathological fracture: Secondary | ICD-10-CM | POA: Diagnosis not present

## 2016-09-15 DIAGNOSIS — H353222 Exudative age-related macular degeneration, left eye, with inactive choroidal neovascularization: Secondary | ICD-10-CM | POA: Diagnosis not present

## 2016-09-27 ENCOUNTER — Ambulatory Visit
Admission: RE | Admit: 2016-09-27 | Discharge: 2016-09-27 | Disposition: A | Discharge status: Home / Self Care | Payer: Commercial Managed Care - HMO | Source: Ambulatory Visit | Attending: Internal Medicine | Admitting: Internal Medicine

## 2016-09-27 ENCOUNTER — Other Ambulatory Visit: Payer: Self-pay | Admitting: Internal Medicine

## 2016-09-27 DIAGNOSIS — Z1231 Encounter for screening mammogram for malignant neoplasm of breast: Secondary | ICD-10-CM

## 2016-10-10 DIAGNOSIS — I10 Essential (primary) hypertension: Secondary | ICD-10-CM | POA: Diagnosis not present

## 2016-10-10 DIAGNOSIS — Z791 Long term (current) use of non-steroidal anti-inflammatories (NSAID): Secondary | ICD-10-CM | POA: Diagnosis not present

## 2016-10-17 DIAGNOSIS — F3342 Major depressive disorder, recurrent, in full remission: Secondary | ICD-10-CM | POA: Diagnosis not present

## 2016-10-17 DIAGNOSIS — K219 Gastro-esophageal reflux disease without esophagitis: Secondary | ICD-10-CM | POA: Diagnosis not present

## 2016-10-17 DIAGNOSIS — M1990 Unspecified osteoarthritis, unspecified site: Secondary | ICD-10-CM | POA: Diagnosis not present

## 2016-10-17 DIAGNOSIS — M810 Age-related osteoporosis without current pathological fracture: Secondary | ICD-10-CM | POA: Diagnosis not present

## 2016-10-17 DIAGNOSIS — E784 Other hyperlipidemia: Secondary | ICD-10-CM | POA: Diagnosis not present

## 2016-10-17 DIAGNOSIS — I10 Essential (primary) hypertension: Secondary | ICD-10-CM | POA: Diagnosis not present

## 2016-10-28 DIAGNOSIS — H353222 Exudative age-related macular degeneration, left eye, with inactive choroidal neovascularization: Secondary | ICD-10-CM | POA: Diagnosis not present

## 2016-12-02 DIAGNOSIS — H43813 Vitreous degeneration, bilateral: Secondary | ICD-10-CM | POA: Diagnosis not present

## 2016-12-02 DIAGNOSIS — H353222 Exudative age-related macular degeneration, left eye, with inactive choroidal neovascularization: Secondary | ICD-10-CM | POA: Diagnosis not present

## 2017-01-09 DIAGNOSIS — H353222 Exudative age-related macular degeneration, left eye, with inactive choroidal neovascularization: Secondary | ICD-10-CM | POA: Diagnosis not present

## 2017-01-09 DIAGNOSIS — Z1283 Encounter for screening for malignant neoplasm of skin: Secondary | ICD-10-CM | POA: Diagnosis not present

## 2017-01-09 DIAGNOSIS — L812 Freckles: Secondary | ICD-10-CM | POA: Diagnosis not present

## 2017-01-09 DIAGNOSIS — L820 Inflamed seborrheic keratosis: Secondary | ICD-10-CM | POA: Diagnosis not present

## 2017-01-09 DIAGNOSIS — D229 Melanocytic nevi, unspecified: Secondary | ICD-10-CM | POA: Diagnosis not present

## 2017-01-09 DIAGNOSIS — L578 Other skin changes due to chronic exposure to nonionizing radiation: Secondary | ICD-10-CM | POA: Diagnosis not present

## 2017-01-09 DIAGNOSIS — Z85828 Personal history of other malignant neoplasm of skin: Secondary | ICD-10-CM | POA: Diagnosis not present

## 2017-01-09 DIAGNOSIS — L821 Other seborrheic keratosis: Secondary | ICD-10-CM | POA: Diagnosis not present

## 2017-01-09 DIAGNOSIS — D1800 Hemangioma unspecified site: Secondary | ICD-10-CM | POA: Diagnosis not present

## 2017-01-09 DIAGNOSIS — D692 Other nonthrombocytopenic purpura: Secondary | ICD-10-CM | POA: Diagnosis not present

## 2017-02-13 DIAGNOSIS — E784 Other hyperlipidemia: Secondary | ICD-10-CM | POA: Diagnosis not present

## 2017-02-13 DIAGNOSIS — I10 Essential (primary) hypertension: Secondary | ICD-10-CM | POA: Diagnosis not present

## 2017-02-13 DIAGNOSIS — K219 Gastro-esophageal reflux disease without esophagitis: Secondary | ICD-10-CM | POA: Diagnosis not present

## 2017-02-20 DIAGNOSIS — M810 Age-related osteoporosis without current pathological fracture: Secondary | ICD-10-CM | POA: Diagnosis not present

## 2017-02-20 DIAGNOSIS — K219 Gastro-esophageal reflux disease without esophagitis: Secondary | ICD-10-CM | POA: Diagnosis not present

## 2017-02-20 DIAGNOSIS — I10 Essential (primary) hypertension: Secondary | ICD-10-CM | POA: Diagnosis not present

## 2017-02-20 DIAGNOSIS — F3342 Major depressive disorder, recurrent, in full remission: Secondary | ICD-10-CM | POA: Diagnosis not present

## 2017-02-22 DIAGNOSIS — H353222 Exudative age-related macular degeneration, left eye, with inactive choroidal neovascularization: Secondary | ICD-10-CM | POA: Diagnosis not present

## 2017-03-22 DIAGNOSIS — M810 Age-related osteoporosis without current pathological fracture: Secondary | ICD-10-CM | POA: Diagnosis not present

## 2017-04-10 DIAGNOSIS — H353222 Exudative age-related macular degeneration, left eye, with inactive choroidal neovascularization: Secondary | ICD-10-CM | POA: Diagnosis not present

## 2017-05-18 DIAGNOSIS — H353131 Nonexudative age-related macular degeneration, bilateral, early dry stage: Secondary | ICD-10-CM | POA: Diagnosis not present

## 2017-05-18 DIAGNOSIS — H353222 Exudative age-related macular degeneration, left eye, with inactive choroidal neovascularization: Secondary | ICD-10-CM | POA: Diagnosis not present

## 2017-06-23 DIAGNOSIS — H353222 Exudative age-related macular degeneration, left eye, with inactive choroidal neovascularization: Secondary | ICD-10-CM | POA: Diagnosis not present

## 2017-07-06 DIAGNOSIS — I10 Essential (primary) hypertension: Secondary | ICD-10-CM | POA: Diagnosis not present

## 2017-07-06 DIAGNOSIS — K219 Gastro-esophageal reflux disease without esophagitis: Secondary | ICD-10-CM | POA: Diagnosis not present

## 2017-07-11 HISTORY — PX: OTHER SURGICAL HISTORY: SHX169

## 2017-07-12 DIAGNOSIS — I10 Essential (primary) hypertension: Secondary | ICD-10-CM | POA: Diagnosis not present

## 2017-07-12 DIAGNOSIS — Z720 Tobacco use: Secondary | ICD-10-CM | POA: Diagnosis not present

## 2017-07-12 DIAGNOSIS — M810 Age-related osteoporosis without current pathological fracture: Secondary | ICD-10-CM | POA: Diagnosis not present

## 2017-07-12 DIAGNOSIS — F3342 Major depressive disorder, recurrent, in full remission: Secondary | ICD-10-CM | POA: Diagnosis not present

## 2017-07-12 DIAGNOSIS — R0789 Other chest pain: Secondary | ICD-10-CM | POA: Diagnosis not present

## 2017-07-12 DIAGNOSIS — K219 Gastro-esophageal reflux disease without esophagitis: Secondary | ICD-10-CM | POA: Diagnosis not present

## 2017-07-12 DIAGNOSIS — Z0000 Encounter for general adult medical examination without abnormal findings: Secondary | ICD-10-CM | POA: Diagnosis not present

## 2017-07-13 ENCOUNTER — Telehealth: Payer: Self-pay | Admitting: *Deleted

## 2017-07-13 DIAGNOSIS — Z122 Encounter for screening for malignant neoplasm of respiratory organs: Secondary | ICD-10-CM

## 2017-07-13 DIAGNOSIS — Z87891 Personal history of nicotine dependence: Secondary | ICD-10-CM

## 2017-07-13 NOTE — Telephone Encounter (Signed)
Received referral for initial lung cancer screening scan. Contacted patient and obtained smoking history,(current, 50 pack year) as well as answering questions related to screening process. Patient denies signs of lung cancer such as weight loss or hemoptysis. Patient denies comorbidity that would prevent curative treatment if lung cancer were found. Patient is scheduled for shared decision making visit and CT scan on 07/25/17.

## 2017-07-19 DIAGNOSIS — R0789 Other chest pain: Secondary | ICD-10-CM | POA: Diagnosis not present

## 2017-07-25 ENCOUNTER — Inpatient Hospital Stay: Payer: Medicare HMO | Attending: Nurse Practitioner | Admitting: Nurse Practitioner

## 2017-07-25 ENCOUNTER — Ambulatory Visit
Admission: RE | Admit: 2017-07-25 | Discharge: 2017-07-25 | Disposition: A | Discharge status: Home / Self Care | Payer: Medicare HMO | Source: Ambulatory Visit | Attending: Nurse Practitioner | Admitting: Nurse Practitioner

## 2017-07-25 DIAGNOSIS — Z122 Encounter for screening for malignant neoplasm of respiratory organs: Secondary | ICD-10-CM

## 2017-07-25 DIAGNOSIS — I700 Atherosclerosis of aorta: Secondary | ICD-10-CM | POA: Insufficient documentation

## 2017-07-25 DIAGNOSIS — Z87891 Personal history of nicotine dependence: Secondary | ICD-10-CM

## 2017-07-25 DIAGNOSIS — F17210 Nicotine dependence, cigarettes, uncomplicated: Secondary | ICD-10-CM | POA: Diagnosis not present

## 2017-07-25 DIAGNOSIS — M4854XA Collapsed vertebra, not elsewhere classified, thoracic region, initial encounter for fracture: Secondary | ICD-10-CM | POA: Diagnosis not present

## 2017-07-25 DIAGNOSIS — I712 Thoracic aortic aneurysm, without rupture: Secondary | ICD-10-CM | POA: Insufficient documentation

## 2017-07-25 DIAGNOSIS — J439 Emphysema, unspecified: Secondary | ICD-10-CM | POA: Diagnosis not present

## 2017-07-25 NOTE — Progress Notes (Signed)
In accordance with CMS guidelines, patient has met eligibility criteria including age, absence of signs or symptoms of lung cancer.  Social History   Tobacco Use  . Smoking status: Current Every Day Smoker    Packs/day: 1.00    Years: 50.00    Pack years: 50.00    Types: Cigarettes  . Smokeless tobacco: Never Used  Substance Use Topics  . Alcohol use: Yes    Alcohol/week: 8.4 oz    Types: 14 Shots of liquor per week  . Drug use: No     A shared decision-making session was conducted prior to the performance of CT scan. This includes one or more decision aids, includes benefits and harms of screening, follow-up diagnostic testing, over-diagnosis, false positive rate, and total radiation exposure.  Counseling on the importance of adherence to annual lung cancer LDCT screening, impact of co-morbidities, and ability or willingness to undergo diagnosis and treatment is imperative for compliance of the program.  Counseling on the importance of continued smoking cessation for former smokers; the importance of smoking cessation for current smokers, and information about tobacco cessation interventions have been given to patient including [place] and [facility] quit [place] programs.  Written order for lung cancer screening with LDCT has been given to the patient and any and all questions have been answered to the best of my abilities.   Yearly follow up will be coordinated by Burgess Estelle, Thoracic Navigator.  Beckey Rutter, DNP, AGNP-C [place] [facility] [phone] (office) 07/25/17 2:17 PM

## 2017-07-27 ENCOUNTER — Encounter: Payer: Self-pay | Admitting: *Deleted

## 2017-07-28 DIAGNOSIS — H353222 Exudative age-related macular degeneration, left eye, with inactive choroidal neovascularization: Secondary | ICD-10-CM | POA: Diagnosis not present

## 2017-08-22 ENCOUNTER — Other Ambulatory Visit: Payer: Self-pay | Admitting: Internal Medicine

## 2017-08-22 DIAGNOSIS — Z1231 Encounter for screening mammogram for malignant neoplasm of breast: Secondary | ICD-10-CM

## 2017-09-05 DIAGNOSIS — H353222 Exudative age-related macular degeneration, left eye, with inactive choroidal neovascularization: Secondary | ICD-10-CM | POA: Diagnosis not present

## 2017-09-06 DIAGNOSIS — F17200 Nicotine dependence, unspecified, uncomplicated: Secondary | ICD-10-CM | POA: Diagnosis not present

## 2017-09-06 DIAGNOSIS — M810 Age-related osteoporosis without current pathological fracture: Secondary | ICD-10-CM | POA: Diagnosis not present

## 2017-10-10 DIAGNOSIS — H353222 Exudative age-related macular degeneration, left eye, with inactive choroidal neovascularization: Secondary | ICD-10-CM | POA: Diagnosis not present

## 2017-10-11 ENCOUNTER — Ambulatory Visit
Admission: RE | Admit: 2017-10-11 | Discharge: 2017-10-11 | Disposition: A | Discharge status: Home / Self Care | Payer: Medicare HMO | Source: Ambulatory Visit | Attending: Internal Medicine | Admitting: Internal Medicine

## 2017-10-11 DIAGNOSIS — Z1231 Encounter for screening mammogram for malignant neoplasm of breast: Secondary | ICD-10-CM

## 2017-11-02 DIAGNOSIS — R110 Nausea: Secondary | ICD-10-CM | POA: Diagnosis not present

## 2017-11-02 DIAGNOSIS — R195 Other fecal abnormalities: Secondary | ICD-10-CM | POA: Diagnosis not present

## 2017-11-02 DIAGNOSIS — R197 Diarrhea, unspecified: Secondary | ICD-10-CM | POA: Diagnosis not present

## 2017-11-03 DIAGNOSIS — R110 Nausea: Secondary | ICD-10-CM | POA: Diagnosis not present

## 2017-11-03 DIAGNOSIS — R195 Other fecal abnormalities: Secondary | ICD-10-CM | POA: Diagnosis not present

## 2017-11-03 DIAGNOSIS — R197 Diarrhea, unspecified: Secondary | ICD-10-CM | POA: Diagnosis not present

## 2017-11-21 DIAGNOSIS — F3342 Major depressive disorder, recurrent, in full remission: Secondary | ICD-10-CM | POA: Diagnosis not present

## 2017-11-21 DIAGNOSIS — I10 Essential (primary) hypertension: Secondary | ICD-10-CM | POA: Diagnosis not present

## 2017-11-21 DIAGNOSIS — K219 Gastro-esophageal reflux disease without esophagitis: Secondary | ICD-10-CM | POA: Diagnosis not present

## 2017-11-21 DIAGNOSIS — R197 Diarrhea, unspecified: Secondary | ICD-10-CM | POA: Diagnosis not present

## 2017-11-21 DIAGNOSIS — I712 Thoracic aortic aneurysm, without rupture: Secondary | ICD-10-CM | POA: Diagnosis not present

## 2017-11-21 DIAGNOSIS — I700 Atherosclerosis of aorta: Secondary | ICD-10-CM | POA: Diagnosis not present

## 2017-11-21 DIAGNOSIS — M810 Age-related osteoporosis without current pathological fracture: Secondary | ICD-10-CM | POA: Diagnosis not present

## 2017-11-22 ENCOUNTER — Other Ambulatory Visit
Admission: RE | Admit: 2017-11-22 | Discharge: 2017-11-22 | Disposition: A | Discharge status: Home / Self Care | Payer: Medicare HMO | Source: Ambulatory Visit | Attending: Internal Medicine | Admitting: Internal Medicine

## 2017-11-22 DIAGNOSIS — R197 Diarrhea, unspecified: Secondary | ICD-10-CM | POA: Insufficient documentation

## 2017-11-22 DIAGNOSIS — H353222 Exudative age-related macular degeneration, left eye, with inactive choroidal neovascularization: Secondary | ICD-10-CM | POA: Diagnosis not present

## 2017-11-22 LAB — GASTROINTESTINAL PANEL BY PCR, STOOL (REPLACES STOOL CULTURE)
ADENOVIRUS F40/41: NOT DETECTED
ASTROVIRUS: NOT DETECTED
CAMPYLOBACTER SPECIES: NOT DETECTED
CRYPTOSPORIDIUM: NOT DETECTED
CYCLOSPORA CAYETANENSIS: NOT DETECTED
ENTEROAGGREGATIVE E COLI (EAEC): NOT DETECTED
ENTEROPATHOGENIC E COLI (EPEC): NOT DETECTED
ENTEROTOXIGENIC E COLI (ETEC): NOT DETECTED
Entamoeba histolytica: NOT DETECTED
GIARDIA LAMBLIA: NOT DETECTED
Norovirus GI/GII: NOT DETECTED
Plesimonas shigelloides: NOT DETECTED
Rotavirus A: NOT DETECTED
SHIGELLA/ENTEROINVASIVE E COLI (EIEC): NOT DETECTED
Salmonella species: NOT DETECTED
Sapovirus (I, II, IV, and V): NOT DETECTED
Shiga like toxin producing E coli (STEC): NOT DETECTED
VIBRIO SPECIES: NOT DETECTED
Vibrio cholerae: NOT DETECTED
Yersinia enterocolitica: NOT DETECTED

## 2017-12-12 DIAGNOSIS — K529 Noninfective gastroenteritis and colitis, unspecified: Secondary | ICD-10-CM | POA: Diagnosis not present

## 2017-12-15 DIAGNOSIS — I10 Essential (primary) hypertension: Secondary | ICD-10-CM | POA: Diagnosis not present

## 2017-12-15 DIAGNOSIS — K640 First degree hemorrhoids: Secondary | ICD-10-CM | POA: Diagnosis not present

## 2017-12-15 DIAGNOSIS — K5289 Other specified noninfective gastroenteritis and colitis: Secondary | ICD-10-CM | POA: Diagnosis not present

## 2017-12-15 DIAGNOSIS — M1990 Unspecified osteoarthritis, unspecified site: Secondary | ICD-10-CM | POA: Diagnosis not present

## 2017-12-15 DIAGNOSIS — K52832 Lymphocytic colitis: Secondary | ICD-10-CM | POA: Diagnosis not present

## 2017-12-15 DIAGNOSIS — F329 Major depressive disorder, single episode, unspecified: Secondary | ICD-10-CM | POA: Diagnosis not present

## 2017-12-15 DIAGNOSIS — K6289 Other specified diseases of anus and rectum: Secondary | ICD-10-CM | POA: Diagnosis not present

## 2017-12-15 DIAGNOSIS — D123 Benign neoplasm of transverse colon: Secondary | ICD-10-CM | POA: Diagnosis not present

## 2017-12-15 DIAGNOSIS — K219 Gastro-esophageal reflux disease without esophagitis: Secondary | ICD-10-CM | POA: Diagnosis not present

## 2017-12-15 DIAGNOSIS — R159 Full incontinence of feces: Secondary | ICD-10-CM | POA: Diagnosis not present

## 2017-12-15 DIAGNOSIS — K620 Anal polyp: Secondary | ICD-10-CM | POA: Diagnosis not present

## 2017-12-15 DIAGNOSIS — J439 Emphysema, unspecified: Secondary | ICD-10-CM | POA: Diagnosis not present

## 2017-12-15 DIAGNOSIS — K591 Functional diarrhea: Secondary | ICD-10-CM | POA: Diagnosis not present

## 2018-01-01 DIAGNOSIS — H353222 Exudative age-related macular degeneration, left eye, with inactive choroidal neovascularization: Secondary | ICD-10-CM | POA: Diagnosis not present

## 2018-01-15 DIAGNOSIS — M810 Age-related osteoporosis without current pathological fracture: Secondary | ICD-10-CM | POA: Diagnosis not present

## 2018-01-15 DIAGNOSIS — H353222 Exudative age-related macular degeneration, left eye, with inactive choroidal neovascularization: Secondary | ICD-10-CM | POA: Diagnosis not present

## 2018-01-15 DIAGNOSIS — Z8781 Personal history of (healed) traumatic fracture: Secondary | ICD-10-CM | POA: Diagnosis not present

## 2018-01-15 DIAGNOSIS — I700 Atherosclerosis of aorta: Secondary | ICD-10-CM | POA: Diagnosis not present

## 2018-01-15 DIAGNOSIS — I10 Essential (primary) hypertension: Secondary | ICD-10-CM | POA: Diagnosis not present

## 2018-01-15 DIAGNOSIS — K52832 Lymphocytic colitis: Secondary | ICD-10-CM | POA: Diagnosis not present

## 2018-01-15 DIAGNOSIS — K219 Gastro-esophageal reflux disease without esophagitis: Secondary | ICD-10-CM | POA: Diagnosis not present

## 2018-01-15 DIAGNOSIS — F3342 Major depressive disorder, recurrent, in full remission: Secondary | ICD-10-CM | POA: Diagnosis not present

## 2018-01-15 DIAGNOSIS — I712 Thoracic aortic aneurysm, without rupture: Secondary | ICD-10-CM | POA: Diagnosis not present

## 2018-01-31 DIAGNOSIS — D225 Melanocytic nevi of trunk: Secondary | ICD-10-CM | POA: Diagnosis not present

## 2018-01-31 DIAGNOSIS — Z1283 Encounter for screening for malignant neoplasm of skin: Secondary | ICD-10-CM | POA: Diagnosis not present

## 2018-01-31 DIAGNOSIS — Z85828 Personal history of other malignant neoplasm of skin: Secondary | ICD-10-CM | POA: Diagnosis not present

## 2018-01-31 DIAGNOSIS — L821 Other seborrheic keratosis: Secondary | ICD-10-CM | POA: Diagnosis not present

## 2018-01-31 DIAGNOSIS — D1801 Hemangioma of skin and subcutaneous tissue: Secondary | ICD-10-CM | POA: Diagnosis not present

## 2018-01-31 DIAGNOSIS — L812 Freckles: Secondary | ICD-10-CM | POA: Diagnosis not present

## 2018-01-31 DIAGNOSIS — L820 Inflamed seborrheic keratosis: Secondary | ICD-10-CM | POA: Diagnosis not present

## 2018-01-31 DIAGNOSIS — L578 Other skin changes due to chronic exposure to nonionizing radiation: Secondary | ICD-10-CM | POA: Diagnosis not present

## 2018-02-05 DIAGNOSIS — H353222 Exudative age-related macular degeneration, left eye, with inactive choroidal neovascularization: Secondary | ICD-10-CM | POA: Diagnosis not present

## 2018-02-13 DIAGNOSIS — K52832 Lymphocytic colitis: Secondary | ICD-10-CM | POA: Diagnosis not present

## 2018-03-16 ENCOUNTER — Emergency Department: Payer: Medicare HMO

## 2018-03-16 ENCOUNTER — Other Ambulatory Visit: Payer: Self-pay

## 2018-03-16 ENCOUNTER — Emergency Department
Admission: EM | Admit: 2018-03-16 | Discharge: 2018-03-16 | Disposition: A | Discharge status: Home / Self Care | Payer: Medicare HMO | Attending: Emergency Medicine | Admitting: Emergency Medicine

## 2018-03-16 DIAGNOSIS — R002 Palpitations: Secondary | ICD-10-CM | POA: Insufficient documentation

## 2018-03-16 DIAGNOSIS — I10 Essential (primary) hypertension: Secondary | ICD-10-CM | POA: Diagnosis not present

## 2018-03-16 DIAGNOSIS — I471 Supraventricular tachycardia: Secondary | ICD-10-CM | POA: Insufficient documentation

## 2018-03-16 DIAGNOSIS — Z79899 Other long term (current) drug therapy: Secondary | ICD-10-CM | POA: Diagnosis not present

## 2018-03-16 DIAGNOSIS — R0602 Shortness of breath: Secondary | ICD-10-CM | POA: Diagnosis not present

## 2018-03-16 DIAGNOSIS — F17210 Nicotine dependence, cigarettes, uncomplicated: Secondary | ICD-10-CM | POA: Insufficient documentation

## 2018-03-16 DIAGNOSIS — I499 Cardiac arrhythmia, unspecified: Secondary | ICD-10-CM | POA: Diagnosis not present

## 2018-03-16 DIAGNOSIS — R000 Tachycardia, unspecified: Secondary | ICD-10-CM | POA: Diagnosis present

## 2018-03-16 LAB — COMPREHENSIVE METABOLIC PANEL
ALT: 103 U/L — AB (ref 0–44)
AST: 160 U/L — AB (ref 15–41)
Albumin: 3.8 g/dL (ref 3.5–5.0)
Alkaline Phosphatase: 108 U/L (ref 38–126)
Anion gap: 9 (ref 5–15)
BUN: 11 mg/dL (ref 8–23)
CHLORIDE: 106 mmol/L (ref 98–111)
CO2: 28 mmol/L (ref 22–32)
CREATININE: 0.68 mg/dL (ref 0.44–1.00)
Calcium: 8.8 mg/dL — ABNORMAL LOW (ref 8.9–10.3)
GFR calc Af Amer: >60 mL/min (ref >60)
Glucose, Bld: 137 mg/dL — ABNORMAL HIGH (ref 70–99)
POTASSIUM: 3.6 mmol/L (ref 3.5–5.1)
SODIUM: 143 mmol/L (ref 135–145)
Total Bilirubin: 1 mg/dL (ref 0.3–1.2)
Total Protein: 6.3 g/dL — ABNORMAL LOW (ref 6.5–8.1)

## 2018-03-16 LAB — CBC
HCT: 41.1 % (ref 35.0–47.0)
Hemoglobin: 14.3 g/dL (ref 12.0–16.0)
MCH: 34.4 pg — ABNORMAL HIGH (ref 26.0–34.0)
MCHC: 34.7 g/dL (ref 32.0–36.0)
MCV: 99.3 fL (ref 80.0–100.0)
PLATELETS: 170 10*3/uL (ref 150–440)
RBC: 4.14 MIL/uL (ref 3.80–5.20)
RDW: 13.5 % (ref 11.5–14.5)
WBC: 5.8 10*3/uL (ref 3.6–11.0)

## 2018-03-16 LAB — TROPONIN I

## 2018-03-16 MED ORDER — METOPROLOL TARTRATE 25 MG PO TABS: 12.5000 mg | ORAL_TABLET | Freq: Two times a day (BID) | ORAL | 0 refills | Status: DC

## 2018-03-16 MED ORDER — SODIUM CHLORIDE 0.9 % IV BOLUS
500.0000 mL | Freq: Once | INTRAVENOUS | Status: AC
  Administered 2018-03-16: 500 mL via INTRAVENOUS

## 2018-03-16 NOTE — ED Provider Notes (Signed)
[facility] Emergency Department Provider Note   ____________________________________________    I have reviewed the triage vital signs and the nursing notes.   HISTORY  Chief Complaint Palpitations    HPI April Rogers is a 77 y.o. female with a history as noted below presents today with complaints of palpitations.  Patient reports that she woke up at 7 AM and was heading to the bathroom when she developed a sensation that her heart was beating extremely rapidly and she felt short of breath.  She explicitly denies chest pain or pleurisy.  She checked her blood pressure and found it to be 85/60, she did feel lightheaded.  She called EMS.  EMS administered 6 mg of adenosine patient felt significantly better afterwards.  Patient denies any formal history of arrhythmia but does report multiple episodes of a racing heart and feeling "clammy "over the last several months.  Patient does note nighttime sweats over the last several months as well.  Has been gaining weight after dealing with a bout of colitis.  Apparently is on prednisone   Past Medical History:  Diagnosis Date  . Anxiety   . Arthritis   . Cancer ([place])   . Depression   . Fracture of one rib   . GERD (gastroesophageal reflux disease)    was on nexium but no longer needs to take this medication  . Hypertension   . Macular degeneration of both eyes     Patient Active Problem List   Diagnosis Date Noted  . Femur fracture, right, closed, initial encounter 11/25/2015    Past Surgical History:  Procedure Laterality Date  . ARM SKIN LESION BIOPSY / EXCISION Right   . CHOLECYSTECTOMY    . COLONOSCOPY WITH PROPOFOL N/A 08/03/2015   Procedure: COLONOSCOPY WITH PROPOFOL;  Surgeon: Hulen Luster, MD;  Location: [facility] ENDOSCOPY;  Service: Gastroenterology;  Laterality: N/A;  . INTRAMEDULLARY (IM) NAIL INTERTROCHANTERIC Right 11/26/2015   Procedure: INTRAMEDULLARY (IM) NAIL INTERTROCHANTRIC;  Surgeon:  Earnestine Leys, MD;  Location: [facility] ORS;  Service: Orthopedics;  Laterality: Right;  . TONSILLECTOMY    . TUBAL LIGATION    . WRIST RECONSTRUCTION Right     Prior to Admission medications   Medication Sig Start Date End Date Taking? Authorizing Provider  atorvastatin (LIPITOR) 20 MG tablet Take 20 mg by mouth daily.   Yes [provider]  b complex vitamins tablet Take 1 tablet by mouth daily.   Yes [provider]  budesonide (ENTOCORT EC) 3 MG 24 hr capsule Take 3 mg by mouth daily.   Yes [provider]  Cholecalciferol 2000 units CAPS Take 2,000 Units by mouth daily.    Yes [provider]  colestipol (COLESTID) 1 g tablet Take 1 g by mouth daily.   Yes [provider]  ibuprofen (ADVIL,MOTRIN) 200 MG tablet Take 200 mg by mouth every 6 (six) hours as needed for mild pain.    Yes [provider]  lisinopril (PRINIVIL,ZESTRIL) 20 MG tablet Take 20 mg by mouth daily.   Yes [provider]  Multiple Vitamins-Minerals (CENTRUM SILVER PO) Take 1 tablet by mouth daily.   Yes [provider]  Multiple Vitamins-Minerals (PRESERVISION AREDS 2 PO) Take 1 tablet by mouth 2 (two) times daily.   Yes [provider]  polyethylene glycol (MIRALAX / GLYCOLAX) packet Take 17 g by mouth daily as needed for mild constipation.   Yes [provider]  senna (SENOKOT) 8.6 MG TABS tablet  Take 1 tablet (8.6 mg total) by mouth 2 (two) times daily. Patient taking differently: Take 1 tablet by mouth daily as needed for mild constipation.  11/27/15  Yes Hillary Bow, MD  venlafaxine XR (EFFEXOR-XR) 150 MG 24 hr capsule Take 150 mg by mouth 2 (two) times daily.    Yes [provider]  enoxaparin (LOVENOX) 40 MG/0.4ML injection Inject 0.4 mLs (40 mg total) into the skin daily. Patient not taking: Reported on 03/16/2018 11/27/15   Hillary Bow, MD  ferrous sulfate 325 (65 FE) MG tablet Take 1 tablet (325 mg total) by mouth  daily with breakfast. Patient not taking: Reported on 03/16/2018 11/27/15   Hillary Bow, MD  HYDROcodone-acetaminophen (NORCO/VICODIN) 5-325 MG tablet Take 1-2 tablets by mouth every 6 (six) hours as needed for moderate pain. Patient not taking: Reported on 03/16/2018 11/27/15   Hillary Bow, MD  metoprolol tartrate (LOPRESSOR) 25 MG tablet Take 0.5 tablets (12.5 mg total) by mouth 2 (two) times daily. 03/16/18 04/15/18  Lavonia Drafts, MD     Allergies Penicillins; Actonel [risedronate]; Alendronate; Codeine; Meloxicam; Percocet [oxycodone-acetaminophen]; Varenicline; Wellbutrin [bupropion]; and Wine [alcohol]  Family History  Problem Relation Age of Onset  . Hypertension Other   . Breast cancer Neg Hx     Social History Social History   Tobacco Use  . Smoking status: Current Every Day Smoker    Packs/day: 1.00    Years: 50.00    Pack years: 50.00    Types: Cigarettes  . Smokeless tobacco: Never Used  Substance Use Topics  . Alcohol use: Yes    Alcohol/week: 14.0 standard drinks    Types: 14 Shots of liquor per week  . Drug use: No    Review of Systems  Constitutional: No fever/chills Eyes: No visual changes.  ENT: No sore throat. Cardiovascular: As above Respiratory: As above Gastrointestinal: No abdominal pain.  Genitourinary: Negative for dysuria. Musculoskeletal: Negative for back pain. Skin: Negative for rash. Neurological: Negative for headaches    ____________________________________________   PHYSICAL EXAM:  VITAL SIGNS: ED Triage Vitals  Enc Vitals Group     BP 03/16/18 0800 (!) 140/53     Pulse Rate 03/16/18 0803 (!) 103     Resp 03/16/18 0803 13     Temp --      Temp src --      SpO2 03/16/18 0803 96 %     Weight 03/16/18 0805 53.1 kg (117 lb)     Height 03/16/18 0805 1.6 m (5\' 3" )     Head Circumference --      Peak Flow --      Pain Score 03/16/18 0805 0     Pain Loc --      Pain Edu? --      Excl. in [place]? --     Constitutional: Alert  and oriented. No acute distress. Pleasant and interactive  Nose: No congestion/rhinnorhea. Mouth/Throat: Mucous membranes are moist.    Cardiovascular: Normal rate, regular rhythm. Grossly normal heart sounds.  Good peripheral circulation. Respiratory: Normal respiratory effort.  No retractions.  Lungs clear to auscultation Gastrointestinal: Soft and nontender. No distention.   Musculoskeletal: No lower extremity tenderness nor edema.  Neurologic:  Normal speech and language. No gross focal neurologic deficits are appreciated.  Skin:  Skin is warm, dry and intact. No rash noted. Psychiatric: Mood and affect are normal. Speech and behavior are normal.  ____________________________________________   LABS (all labs ordered are listed, but only abnormal results are displayed)  Labs Reviewed  CBC - Abnormal; Notable for the following components:      Result Value   MCH 34.4 (*)    All other components within normal limits  COMPREHENSIVE METABOLIC PANEL - Abnormal; Notable for the following components:   Glucose, Bld 137 (*)    Calcium 8.8 (*)    Total Protein 6.3 (*)    AST 160 (*)    ALT 103 (*)    All other components within normal limits  TROPONIN I  TROPONIN I   ____________________________________________  EKG  ED ECG REPORT I, Lavonia Drafts, the attending physician, personally viewed and interpreted this ECG.  Date: 03/16/2018  Rhythm: Sinus tachycardia QRS Axis: normal Intervals: normal ST/T Wave abnormalities: normal Narrative Interpretation: no evidence of acute ischemia  ____________________________________________  RADIOLOGY  Chest x-ray no pulmonary edema ____________________________________________   PROCEDURES  Procedure(s) performed: No  Procedures   Critical Care performed: No ____________________________________________   INITIAL IMPRESSION / ASSESSMENT AND PLAN / ED COURSE  Pertinent labs & imaging results that were available during  my care of the patient were reviewed by me and considered in my medical decision making (see chart for details).  Patient presents after developing heart palpitations this morning.  EMS monitor is consistent with SVT, responded nicely to adenosine given by EMS.  Currently is asymptomatic although still mildly tachycardic.  Will check enzymes, electrolytes, chest x-ray, EKG and continue to monitor  ----------------------------------------- 10:23 AM on 03/16/2018 -----------------------------------------  Patient continues to feel quite well, heart rate has normalized, vitals reassuring.  Initial troponin normal.  Will check second troponin  ----------------------------------------- 11:27 AM on 03/16/2018 -----------------------------------------  Second troponin is normal.  Patient's heart rate is 93.  We will start her on low-dose metoprolol have her follow-up with cardiology for further evaluation    ____________________________________________   FINAL CLINICAL IMPRESSION(S) / ED DIAGNOSES  Final diagnoses:  SVT (supraventricular tachycardia) ([place])  Palpitations        Note:  This document was prepared using Dragon voice recognition software and may include unintentional dictation errors.    Lavonia Drafts, MD 03/16/18 [phone]

## 2018-03-16 NOTE — ED Triage Notes (Addendum)
Pt comes from home via [facility]. Around 7am she woke up feeling "fluttering" in her chest and SOB with diaphoresis. HR was 190 on EMS arrival. 6mg  of adenosine was pushed IV at 0737 and HR went down to 113 in sinus tach. BP was 136/66. CBG 229. 20G in L AC. Reports she has a history of colitis and an AAA.

## 2018-03-16 NOTE — ED Notes (Signed)
Troponin sent.

## 2018-03-21 DIAGNOSIS — H353222 Exudative age-related macular degeneration, left eye, with inactive choroidal neovascularization: Secondary | ICD-10-CM | POA: Diagnosis not present

## 2018-03-26 DIAGNOSIS — Z8619 Personal history of other infectious and parasitic diseases: Secondary | ICD-10-CM | POA: Diagnosis not present

## 2018-03-26 DIAGNOSIS — I351 Nonrheumatic aortic (valve) insufficiency: Secondary | ICD-10-CM | POA: Diagnosis not present

## 2018-03-26 DIAGNOSIS — I10 Essential (primary) hypertension: Secondary | ICD-10-CM | POA: Diagnosis not present

## 2018-03-26 DIAGNOSIS — I700 Atherosclerosis of aorta: Secondary | ICD-10-CM | POA: Diagnosis not present

## 2018-03-26 DIAGNOSIS — R011 Cardiac murmur, unspecified: Secondary | ICD-10-CM | POA: Diagnosis not present

## 2018-03-26 DIAGNOSIS — I712 Thoracic aortic aneurysm, without rupture: Secondary | ICD-10-CM | POA: Diagnosis not present

## 2018-04-05 DIAGNOSIS — R011 Cardiac murmur, unspecified: Secondary | ICD-10-CM | POA: Diagnosis not present

## 2018-04-12 DIAGNOSIS — I10 Essential (primary) hypertension: Secondary | ICD-10-CM | POA: Diagnosis not present

## 2018-04-12 DIAGNOSIS — I712 Thoracic aortic aneurysm, without rupture: Secondary | ICD-10-CM | POA: Diagnosis not present

## 2018-04-12 DIAGNOSIS — I700 Atherosclerosis of aorta: Secondary | ICD-10-CM | POA: Diagnosis not present

## 2018-04-12 DIAGNOSIS — K219 Gastro-esophageal reflux disease without esophagitis: Secondary | ICD-10-CM | POA: Diagnosis not present

## 2018-04-13 DIAGNOSIS — I712 Thoracic aortic aneurysm, without rupture: Secondary | ICD-10-CM | POA: Diagnosis not present

## 2018-04-13 DIAGNOSIS — I351 Nonrheumatic aortic (valve) insufficiency: Secondary | ICD-10-CM | POA: Diagnosis not present

## 2018-04-13 DIAGNOSIS — I10 Essential (primary) hypertension: Secondary | ICD-10-CM | POA: Diagnosis not present

## 2018-04-13 DIAGNOSIS — I700 Atherosclerosis of aorta: Secondary | ICD-10-CM | POA: Diagnosis not present

## 2018-04-19 DIAGNOSIS — F3342 Major depressive disorder, recurrent, in full remission: Secondary | ICD-10-CM | POA: Diagnosis not present

## 2018-04-19 DIAGNOSIS — M810 Age-related osteoporosis without current pathological fracture: Secondary | ICD-10-CM | POA: Diagnosis not present

## 2018-04-19 DIAGNOSIS — Z23 Encounter for immunization: Secondary | ICD-10-CM | POA: Diagnosis not present

## 2018-04-19 DIAGNOSIS — I712 Thoracic aortic aneurysm, without rupture: Secondary | ICD-10-CM | POA: Diagnosis not present

## 2018-04-19 DIAGNOSIS — K52832 Lymphocytic colitis: Secondary | ICD-10-CM | POA: Diagnosis not present

## 2018-04-19 DIAGNOSIS — K219 Gastro-esophageal reflux disease without esophagitis: Secondary | ICD-10-CM | POA: Diagnosis not present

## 2018-04-19 DIAGNOSIS — I5023 Acute on chronic systolic (congestive) heart failure: Secondary | ICD-10-CM | POA: Diagnosis not present

## 2018-04-19 DIAGNOSIS — I700 Atherosclerosis of aorta: Secondary | ICD-10-CM | POA: Diagnosis not present

## 2018-04-19 DIAGNOSIS — I10 Essential (primary) hypertension: Secondary | ICD-10-CM | POA: Diagnosis not present

## 2018-04-26 DIAGNOSIS — H353222 Exudative age-related macular degeneration, left eye, with inactive choroidal neovascularization: Secondary | ICD-10-CM | POA: Diagnosis not present

## 2018-05-07 DIAGNOSIS — I110 Hypertensive heart disease with heart failure: Secondary | ICD-10-CM | POA: Diagnosis not present

## 2018-05-07 DIAGNOSIS — Z79899 Other long term (current) drug therapy: Secondary | ICD-10-CM | POA: Diagnosis not present

## 2018-05-07 DIAGNOSIS — I5022 Chronic systolic (congestive) heart failure: Secondary | ICD-10-CM | POA: Diagnosis not present

## 2018-05-07 DIAGNOSIS — J811 Chronic pulmonary edema: Secondary | ICD-10-CM | POA: Diagnosis not present

## 2018-05-07 DIAGNOSIS — I712 Thoracic aortic aneurysm, without rupture: Secondary | ICD-10-CM | POA: Diagnosis not present

## 2018-05-07 DIAGNOSIS — I351 Nonrheumatic aortic (valve) insufficiency: Secondary | ICD-10-CM | POA: Diagnosis not present

## 2018-05-07 DIAGNOSIS — R918 Other nonspecific abnormal finding of lung field: Secondary | ICD-10-CM | POA: Diagnosis not present

## 2018-05-23 DIAGNOSIS — M810 Age-related osteoporosis without current pathological fracture: Secondary | ICD-10-CM | POA: Diagnosis not present

## 2018-05-23 DIAGNOSIS — I10 Essential (primary) hypertension: Secondary | ICD-10-CM | POA: Diagnosis not present

## 2018-05-23 DIAGNOSIS — I712 Thoracic aortic aneurysm, without rupture: Secondary | ICD-10-CM | POA: Diagnosis not present

## 2018-05-23 DIAGNOSIS — I5022 Chronic systolic (congestive) heart failure: Secondary | ICD-10-CM | POA: Diagnosis not present

## 2018-05-23 DIAGNOSIS — F3342 Major depressive disorder, recurrent, in full remission: Secondary | ICD-10-CM | POA: Diagnosis not present

## 2018-05-23 DIAGNOSIS — K219 Gastro-esophageal reflux disease without esophagitis: Secondary | ICD-10-CM | POA: Diagnosis not present

## 2018-05-23 DIAGNOSIS — K52832 Lymphocytic colitis: Secondary | ICD-10-CM | POA: Diagnosis not present

## 2018-05-28 DIAGNOSIS — R402344 Coma scale, best motor response, flexion withdrawal, 24 hours or more after hospital admission: Secondary | ICD-10-CM | POA: Diagnosis not present

## 2018-05-28 DIAGNOSIS — R569 Unspecified convulsions: Secondary | ICD-10-CM | POA: Diagnosis not present

## 2018-05-28 DIAGNOSIS — F05 Delirium due to known physiological condition: Secondary | ICD-10-CM | POA: Diagnosis not present

## 2018-05-28 DIAGNOSIS — R570 Cardiogenic shock: Secondary | ICD-10-CM | POA: Diagnosis not present

## 2018-05-28 DIAGNOSIS — E876 Hypokalemia: Secondary | ICD-10-CM | POA: Diagnosis not present

## 2018-05-28 DIAGNOSIS — F3342 Major depressive disorder, recurrent, in full remission: Secondary | ICD-10-CM | POA: Diagnosis not present

## 2018-05-28 DIAGNOSIS — I97190 Other postprocedural cardiac functional disturbances following cardiac surgery: Secondary | ICD-10-CM | POA: Diagnosis not present

## 2018-05-28 DIAGNOSIS — Z720 Tobacco use: Secondary | ICD-10-CM | POA: Diagnosis not present

## 2018-05-28 DIAGNOSIS — Z8679 Personal history of other diseases of the circulatory system: Secondary | ICD-10-CM | POA: Diagnosis not present

## 2018-05-28 DIAGNOSIS — I712 Thoracic aortic aneurysm, without rupture: Secondary | ICD-10-CM | POA: Diagnosis not present

## 2018-05-28 DIAGNOSIS — I471 Supraventricular tachycardia: Secondary | ICD-10-CM | POA: Diagnosis not present

## 2018-05-28 DIAGNOSIS — Z452 Encounter for adjustment and management of vascular access device: Secondary | ICD-10-CM | POA: Diagnosis not present

## 2018-05-28 DIAGNOSIS — Z9889 Other specified postprocedural states: Secondary | ICD-10-CM | POA: Diagnosis not present

## 2018-05-28 DIAGNOSIS — G252 Other specified forms of tremor: Secondary | ICD-10-CM | POA: Diagnosis not present

## 2018-05-28 DIAGNOSIS — Z954 Presence of other heart-valve replacement: Secondary | ICD-10-CM | POA: Diagnosis not present

## 2018-05-28 DIAGNOSIS — I10 Essential (primary) hypertension: Secondary | ICD-10-CM | POA: Diagnosis not present

## 2018-05-28 DIAGNOSIS — J90 Pleural effusion, not elsewhere classified: Secondary | ICD-10-CM | POA: Diagnosis not present

## 2018-05-28 DIAGNOSIS — I061 Rheumatic aortic insufficiency: Secondary | ICD-10-CM | POA: Diagnosis not present

## 2018-05-28 DIAGNOSIS — I351 Nonrheumatic aortic (valve) insufficiency: Secondary | ICD-10-CM | POA: Diagnosis not present

## 2018-05-28 DIAGNOSIS — Z953 Presence of xenogenic heart valve: Secondary | ICD-10-CM | POA: Diagnosis not present

## 2018-05-28 DIAGNOSIS — G9340 Encephalopathy, unspecified: Secondary | ICD-10-CM | POA: Diagnosis not present

## 2018-05-28 DIAGNOSIS — Z8659 Personal history of other mental and behavioral disorders: Secondary | ICD-10-CM | POA: Diagnosis not present

## 2018-05-28 DIAGNOSIS — R402214 Coma scale, best verbal response, none, 24 hours or more after hospital admission: Secondary | ICD-10-CM | POA: Diagnosis not present

## 2018-05-28 DIAGNOSIS — R4182 Altered mental status, unspecified: Secondary | ICD-10-CM | POA: Diagnosis not present

## 2018-05-28 DIAGNOSIS — D62 Acute posthemorrhagic anemia: Secondary | ICD-10-CM | POA: Diagnosis not present

## 2018-05-28 DIAGNOSIS — K52832 Lymphocytic colitis: Secondary | ICD-10-CM | POA: Diagnosis not present

## 2018-05-28 DIAGNOSIS — Z01810 Encounter for preprocedural cardiovascular examination: Secondary | ICD-10-CM | POA: Diagnosis not present

## 2018-05-28 DIAGNOSIS — I5020 Unspecified systolic (congestive) heart failure: Secondary | ICD-10-CM | POA: Diagnosis not present

## 2018-05-28 DIAGNOSIS — J95821 Acute postprocedural respiratory failure: Secondary | ICD-10-CM | POA: Diagnosis not present

## 2018-05-28 DIAGNOSIS — R1312 Dysphagia, oropharyngeal phase: Secondary | ICD-10-CM | POA: Diagnosis not present

## 2018-05-28 DIAGNOSIS — R918 Other nonspecific abnormal finding of lung field: Secondary | ICD-10-CM | POA: Diagnosis not present

## 2018-05-28 DIAGNOSIS — R402124 Coma scale, eyes open, to pain, 24 hours or more after hospital admission: Secondary | ICD-10-CM | POA: Diagnosis not present

## 2018-05-28 DIAGNOSIS — F5102 Adjustment insomnia: Secondary | ICD-10-CM | POA: Diagnosis not present

## 2018-05-28 DIAGNOSIS — Z4659 Encounter for fitting and adjustment of other gastrointestinal appliance and device: Secondary | ICD-10-CM | POA: Diagnosis not present

## 2018-05-28 DIAGNOSIS — E872 Acidosis: Secondary | ICD-10-CM | POA: Diagnosis not present

## 2018-05-28 DIAGNOSIS — I5022 Chronic systolic (congestive) heart failure: Secondary | ICD-10-CM | POA: Diagnosis not present

## 2018-05-28 DIAGNOSIS — J9811 Atelectasis: Secondary | ICD-10-CM | POA: Diagnosis not present

## 2018-05-28 DIAGNOSIS — R945 Abnormal results of liver function studies: Secondary | ICD-10-CM | POA: Diagnosis not present

## 2018-05-28 DIAGNOSIS — I350 Nonrheumatic aortic (valve) stenosis: Secondary | ICD-10-CM | POA: Diagnosis not present

## 2018-05-28 DIAGNOSIS — F17200 Nicotine dependence, unspecified, uncomplicated: Secondary | ICD-10-CM | POA: Diagnosis not present

## 2018-05-28 DIAGNOSIS — Z952 Presence of prosthetic heart valve: Secondary | ICD-10-CM | POA: Diagnosis not present

## 2018-05-28 DIAGNOSIS — J811 Chronic pulmonary edema: Secondary | ICD-10-CM | POA: Diagnosis not present

## 2018-05-28 DIAGNOSIS — K219 Gastro-esophageal reflux disease without esophagitis: Secondary | ICD-10-CM | POA: Diagnosis not present

## 2018-06-14 MED ORDER — MELATONIN 3 MG PO TABS
6.00 | ORAL_TABLET | ORAL | Status: DC
Start: 2018-06-18 — End: 2018-06-14

## 2018-06-14 MED ORDER — ASPIRIN 81 MG PO CHEW
81.00 | CHEWABLE_TABLET | ORAL | Status: DC
Start: 2018-06-19 — End: 2018-06-14

## 2018-06-14 MED ORDER — MAGNESIUM OXIDE 400 MG PO TABS
400.00 | ORAL_TABLET | ORAL | Status: DC
Start: 2018-06-14 — End: 2018-06-14

## 2018-06-14 MED ORDER — VENLAFAXINE HCL ER 150 MG PO CP24
150.00 | ORAL_CAPSULE | ORAL | Status: DC
Start: 2018-06-18 — End: 2018-06-14

## 2018-06-14 MED ORDER — ATORVASTATIN CALCIUM 40 MG PO TABS
40.00 | ORAL_TABLET | ORAL | Status: DC
Start: 2018-06-19 — End: 2018-06-14

## 2018-06-14 MED ORDER — OLANZAPINE 5 MG PO TABS
5.00 | ORAL_TABLET | ORAL | Status: DC
Start: ? — End: 2018-06-14

## 2018-06-14 MED ORDER — OLANZAPINE 2.5 MG PO TABS
2.50 | ORAL_TABLET | ORAL | Status: DC
Start: ? — End: 2018-06-14

## 2018-06-14 MED ORDER — DEXTROSE 50 % IV SOLN
12.50 | INTRAVENOUS | Status: DC
Start: ? — End: 2018-06-14

## 2018-06-14 MED ORDER — THIAMINE HCL 100 MG PO TABS
100.00 | ORAL_TABLET | ORAL | Status: DC
Start: 2018-06-19 — End: 2018-06-14

## 2018-06-14 MED ORDER — POTASSIUM CHLORIDE 20 MEQ/15ML (10%) PO SOLN
40.00 | ORAL | Status: DC
Start: 2018-06-18 — End: 2018-06-14

## 2018-06-14 MED ORDER — FUROSEMIDE 20 MG PO TABS
20.00 | ORAL_TABLET | ORAL | Status: DC
Start: 2018-06-18 — End: 2018-06-14

## 2018-06-14 MED ORDER — TRAZODONE HCL 50 MG PO TABS
75.00 | ORAL_TABLET | ORAL | Status: DC
Start: 2018-06-18 — End: 2018-06-14

## 2018-06-14 MED ORDER — LIDOCAINE 5 % EX PTCH
2.00 | MEDICATED_PATCH | CUTANEOUS | Status: DC
Start: 2018-06-18 — End: 2018-06-14

## 2018-06-14 MED ORDER — GLUCAGON HCL RDNA (DIAGNOSTIC) 1 MG IJ SOLR
1.00 | INTRAMUSCULAR | Status: DC
Start: ? — End: 2018-06-14

## 2018-06-14 MED ORDER — LOPERAMIDE HCL 2 MG PO CAPS
2.00 | ORAL_CAPSULE | ORAL | Status: DC
Start: ? — End: 2018-06-14

## 2018-06-14 MED ORDER — ACETAMINOPHEN 325 MG PO TABS
650.00 | ORAL_TABLET | ORAL | Status: DC
Start: 2018-06-18 — End: 2018-06-14

## 2018-06-14 MED ORDER — METOPROLOL TARTRATE 50 MG PO TABS
50.00 | ORAL_TABLET | ORAL | Status: DC
Start: 2018-06-14 — End: 2018-06-14

## 2018-06-14 MED ORDER — AMIODARONE HCL 200 MG PO TABS
200.00 | ORAL_TABLET | ORAL | Status: DC
Start: 2018-06-19 — End: 2018-06-14

## 2018-06-14 MED ORDER — PANTOPRAZOLE SODIUM 40 MG PO TBEC
40.00 | DELAYED_RELEASE_TABLET | ORAL | Status: DC
Start: 2018-06-19 — End: 2018-06-14

## 2018-06-14 MED ORDER — ALBUTEROL SULFATE (5 MG/ML) 0.5% IN NEBU
2.50 | INHALATION_SOLUTION | RESPIRATORY_TRACT | Status: DC
Start: ? — End: 2018-06-14

## 2018-06-18 MED ORDER — PSYLLIUM 95 % PO PACK
1.00 | PACK | ORAL | Status: DC
Start: 2018-06-18 — End: 2018-06-18

## 2018-06-18 MED ORDER — BUDESONIDE 3 MG PO CPEP
3.00 | ORAL_CAPSULE | ORAL | Status: DC
Start: 2018-06-19 — End: 2018-06-18

## 2018-06-18 MED ORDER — METOPROLOL TARTRATE 25 MG PO TABS
25.00 | ORAL_TABLET | ORAL | Status: DC
Start: 2018-06-18 — End: 2018-06-18

## 2018-06-18 MED ORDER — MAGNESIUM OXIDE 400 MG PO TABS
800.00 | ORAL_TABLET | ORAL | Status: DC
Start: 2018-06-18 — End: 2018-06-18

## 2018-06-21 DIAGNOSIS — I5022 Chronic systolic (congestive) heart failure: Secondary | ICD-10-CM | POA: Diagnosis not present

## 2018-06-21 DIAGNOSIS — F3342 Major depressive disorder, recurrent, in full remission: Secondary | ICD-10-CM | POA: Diagnosis not present

## 2018-06-21 DIAGNOSIS — H3530 Unspecified macular degeneration: Secondary | ICD-10-CM | POA: Diagnosis not present

## 2018-06-21 DIAGNOSIS — G4700 Insomnia, unspecified: Secondary | ICD-10-CM | POA: Diagnosis not present

## 2018-06-21 DIAGNOSIS — Z48812 Encounter for surgical aftercare following surgery on the circulatory system: Secondary | ICD-10-CM | POA: Diagnosis not present

## 2018-06-21 DIAGNOSIS — I110 Hypertensive heart disease with heart failure: Secondary | ICD-10-CM | POA: Diagnosis not present

## 2018-06-21 DIAGNOSIS — M1990 Unspecified osteoarthritis, unspecified site: Secondary | ICD-10-CM | POA: Diagnosis not present

## 2018-06-21 DIAGNOSIS — I471 Supraventricular tachycardia: Secondary | ICD-10-CM | POA: Diagnosis not present

## 2018-06-21 DIAGNOSIS — J449 Chronic obstructive pulmonary disease, unspecified: Secondary | ICD-10-CM | POA: Diagnosis not present

## 2018-06-22 DIAGNOSIS — G4700 Insomnia, unspecified: Secondary | ICD-10-CM | POA: Diagnosis not present

## 2018-06-22 DIAGNOSIS — Z48812 Encounter for surgical aftercare following surgery on the circulatory system: Secondary | ICD-10-CM | POA: Diagnosis not present

## 2018-06-22 DIAGNOSIS — F3342 Major depressive disorder, recurrent, in full remission: Secondary | ICD-10-CM | POA: Diagnosis not present

## 2018-06-22 DIAGNOSIS — J449 Chronic obstructive pulmonary disease, unspecified: Secondary | ICD-10-CM | POA: Diagnosis not present

## 2018-06-22 DIAGNOSIS — H3530 Unspecified macular degeneration: Secondary | ICD-10-CM | POA: Diagnosis not present

## 2018-06-22 DIAGNOSIS — I110 Hypertensive heart disease with heart failure: Secondary | ICD-10-CM | POA: Diagnosis not present

## 2018-06-22 DIAGNOSIS — M1990 Unspecified osteoarthritis, unspecified site: Secondary | ICD-10-CM | POA: Diagnosis not present

## 2018-06-22 DIAGNOSIS — I471 Supraventricular tachycardia: Secondary | ICD-10-CM | POA: Diagnosis not present

## 2018-06-22 DIAGNOSIS — I5022 Chronic systolic (congestive) heart failure: Secondary | ICD-10-CM | POA: Diagnosis not present

## 2018-06-26 DIAGNOSIS — F3342 Major depressive disorder, recurrent, in full remission: Secondary | ICD-10-CM | POA: Diagnosis not present

## 2018-06-26 DIAGNOSIS — G4700 Insomnia, unspecified: Secondary | ICD-10-CM | POA: Diagnosis not present

## 2018-06-26 DIAGNOSIS — H3530 Unspecified macular degeneration: Secondary | ICD-10-CM | POA: Diagnosis not present

## 2018-06-26 DIAGNOSIS — I5022 Chronic systolic (congestive) heart failure: Secondary | ICD-10-CM | POA: Diagnosis not present

## 2018-06-26 DIAGNOSIS — J449 Chronic obstructive pulmonary disease, unspecified: Secondary | ICD-10-CM | POA: Diagnosis not present

## 2018-06-26 DIAGNOSIS — Z48812 Encounter for surgical aftercare following surgery on the circulatory system: Secondary | ICD-10-CM | POA: Diagnosis not present

## 2018-06-26 DIAGNOSIS — I110 Hypertensive heart disease with heart failure: Secondary | ICD-10-CM | POA: Diagnosis not present

## 2018-06-26 DIAGNOSIS — I471 Supraventricular tachycardia: Secondary | ICD-10-CM | POA: Diagnosis not present

## 2018-06-26 DIAGNOSIS — M1990 Unspecified osteoarthritis, unspecified site: Secondary | ICD-10-CM | POA: Diagnosis not present

## 2018-06-28 DIAGNOSIS — I5022 Chronic systolic (congestive) heart failure: Secondary | ICD-10-CM | POA: Diagnosis not present

## 2018-06-28 DIAGNOSIS — I471 Supraventricular tachycardia: Secondary | ICD-10-CM | POA: Diagnosis not present

## 2018-06-28 DIAGNOSIS — J449 Chronic obstructive pulmonary disease, unspecified: Secondary | ICD-10-CM | POA: Diagnosis not present

## 2018-06-28 DIAGNOSIS — H3530 Unspecified macular degeneration: Secondary | ICD-10-CM | POA: Diagnosis not present

## 2018-06-28 DIAGNOSIS — Z48812 Encounter for surgical aftercare following surgery on the circulatory system: Secondary | ICD-10-CM | POA: Diagnosis not present

## 2018-06-28 DIAGNOSIS — F3342 Major depressive disorder, recurrent, in full remission: Secondary | ICD-10-CM | POA: Diagnosis not present

## 2018-06-28 DIAGNOSIS — I110 Hypertensive heart disease with heart failure: Secondary | ICD-10-CM | POA: Diagnosis not present

## 2018-06-28 DIAGNOSIS — G4700 Insomnia, unspecified: Secondary | ICD-10-CM | POA: Diagnosis not present

## 2018-06-28 DIAGNOSIS — M1990 Unspecified osteoarthritis, unspecified site: Secondary | ICD-10-CM | POA: Diagnosis not present

## 2018-07-02 DIAGNOSIS — Z48812 Encounter for surgical aftercare following surgery on the circulatory system: Secondary | ICD-10-CM | POA: Diagnosis not present

## 2018-07-02 DIAGNOSIS — Z8679 Personal history of other diseases of the circulatory system: Secondary | ICD-10-CM | POA: Diagnosis not present

## 2018-07-02 DIAGNOSIS — Z952 Presence of prosthetic heart valve: Secondary | ICD-10-CM | POA: Diagnosis not present

## 2018-07-02 DIAGNOSIS — R9431 Abnormal electrocardiogram [ECG] [EKG]: Secondary | ICD-10-CM | POA: Diagnosis not present

## 2018-07-02 DIAGNOSIS — Z9889 Other specified postprocedural states: Secondary | ICD-10-CM | POA: Diagnosis not present

## 2018-07-05 DIAGNOSIS — I5022 Chronic systolic (congestive) heart failure: Secondary | ICD-10-CM | POA: Diagnosis not present

## 2018-07-05 DIAGNOSIS — I110 Hypertensive heart disease with heart failure: Secondary | ICD-10-CM | POA: Diagnosis not present

## 2018-07-05 DIAGNOSIS — F3342 Major depressive disorder, recurrent, in full remission: Secondary | ICD-10-CM | POA: Diagnosis not present

## 2018-07-05 DIAGNOSIS — G4700 Insomnia, unspecified: Secondary | ICD-10-CM | POA: Diagnosis not present

## 2018-07-05 DIAGNOSIS — J449 Chronic obstructive pulmonary disease, unspecified: Secondary | ICD-10-CM | POA: Diagnosis not present

## 2018-07-05 DIAGNOSIS — H3530 Unspecified macular degeneration: Secondary | ICD-10-CM | POA: Diagnosis not present

## 2018-07-05 DIAGNOSIS — Z48812 Encounter for surgical aftercare following surgery on the circulatory system: Secondary | ICD-10-CM | POA: Diagnosis not present

## 2018-07-05 DIAGNOSIS — I471 Supraventricular tachycardia: Secondary | ICD-10-CM | POA: Diagnosis not present

## 2018-07-05 DIAGNOSIS — M1990 Unspecified osteoarthritis, unspecified site: Secondary | ICD-10-CM | POA: Diagnosis not present

## 2018-07-10 DIAGNOSIS — M1990 Unspecified osteoarthritis, unspecified site: Secondary | ICD-10-CM | POA: Diagnosis not present

## 2018-07-10 DIAGNOSIS — I110 Hypertensive heart disease with heart failure: Secondary | ICD-10-CM | POA: Diagnosis not present

## 2018-07-10 DIAGNOSIS — Z48812 Encounter for surgical aftercare following surgery on the circulatory system: Secondary | ICD-10-CM | POA: Diagnosis not present

## 2018-07-10 DIAGNOSIS — H3530 Unspecified macular degeneration: Secondary | ICD-10-CM | POA: Diagnosis not present

## 2018-07-10 DIAGNOSIS — I471 Supraventricular tachycardia: Secondary | ICD-10-CM | POA: Diagnosis not present

## 2018-07-10 DIAGNOSIS — I5022 Chronic systolic (congestive) heart failure: Secondary | ICD-10-CM | POA: Diagnosis not present

## 2018-07-10 DIAGNOSIS — J449 Chronic obstructive pulmonary disease, unspecified: Secondary | ICD-10-CM | POA: Diagnosis not present

## 2018-07-10 DIAGNOSIS — F3342 Major depressive disorder, recurrent, in full remission: Secondary | ICD-10-CM | POA: Diagnosis not present

## 2018-07-10 DIAGNOSIS — G4700 Insomnia, unspecified: Secondary | ICD-10-CM | POA: Diagnosis not present

## 2018-07-11 ENCOUNTER — Telehealth: Payer: Self-pay

## 2018-07-11 NOTE — Telephone Encounter (Signed)
Call pt regarding lung screening. PT had open heart surgery on Dec. 17th. Pt doesn't think she need scan at this time. Please advise if you think she still need it.

## 2018-07-12 DIAGNOSIS — G4700 Insomnia, unspecified: Secondary | ICD-10-CM | POA: Diagnosis not present

## 2018-07-12 DIAGNOSIS — I5022 Chronic systolic (congestive) heart failure: Secondary | ICD-10-CM | POA: Diagnosis not present

## 2018-07-12 DIAGNOSIS — J449 Chronic obstructive pulmonary disease, unspecified: Secondary | ICD-10-CM | POA: Diagnosis not present

## 2018-07-12 DIAGNOSIS — Z48812 Encounter for surgical aftercare following surgery on the circulatory system: Secondary | ICD-10-CM | POA: Diagnosis not present

## 2018-07-12 DIAGNOSIS — H3530 Unspecified macular degeneration: Secondary | ICD-10-CM | POA: Diagnosis not present

## 2018-07-12 DIAGNOSIS — F3342 Major depressive disorder, recurrent, in full remission: Secondary | ICD-10-CM | POA: Diagnosis not present

## 2018-07-12 DIAGNOSIS — M1990 Unspecified osteoarthritis, unspecified site: Secondary | ICD-10-CM | POA: Diagnosis not present

## 2018-07-12 DIAGNOSIS — I110 Hypertensive heart disease with heart failure: Secondary | ICD-10-CM | POA: Diagnosis not present

## 2018-07-12 DIAGNOSIS — I471 Supraventricular tachycardia: Secondary | ICD-10-CM | POA: Diagnosis not present

## 2018-07-13 ENCOUNTER — Telehealth: Payer: Self-pay | Admitting: *Deleted

## 2018-07-13 NOTE — Telephone Encounter (Signed)
Left message for patient to notify them that it is time to schedule annual low dose lung cancer screening CT scan. Instructed patient to call back to verify information prior to the scan being scheduled.  

## 2018-07-17 DIAGNOSIS — J449 Chronic obstructive pulmonary disease, unspecified: Secondary | ICD-10-CM | POA: Diagnosis not present

## 2018-07-17 DIAGNOSIS — G4700 Insomnia, unspecified: Secondary | ICD-10-CM | POA: Diagnosis not present

## 2018-07-17 DIAGNOSIS — Z48812 Encounter for surgical aftercare following surgery on the circulatory system: Secondary | ICD-10-CM | POA: Diagnosis not present

## 2018-07-17 DIAGNOSIS — H3530 Unspecified macular degeneration: Secondary | ICD-10-CM | POA: Diagnosis not present

## 2018-07-17 DIAGNOSIS — I5022 Chronic systolic (congestive) heart failure: Secondary | ICD-10-CM | POA: Diagnosis not present

## 2018-07-17 DIAGNOSIS — F3342 Major depressive disorder, recurrent, in full remission: Secondary | ICD-10-CM | POA: Diagnosis not present

## 2018-07-17 DIAGNOSIS — I110 Hypertensive heart disease with heart failure: Secondary | ICD-10-CM | POA: Diagnosis not present

## 2018-07-17 DIAGNOSIS — M1990 Unspecified osteoarthritis, unspecified site: Secondary | ICD-10-CM | POA: Diagnosis not present

## 2018-07-17 DIAGNOSIS — I471 Supraventricular tachycardia: Secondary | ICD-10-CM | POA: Diagnosis not present

## 2018-07-18 ENCOUNTER — Telehealth: Payer: Self-pay | Admitting: *Deleted

## 2018-07-18 NOTE — Telephone Encounter (Signed)
Spoke with daughter regarding upcoming lung screening scan due date. Patient is recovering from recent CABG and does not want scan at this time due to many other tests. Will follow up in 6 months.

## 2018-07-18 NOTE — Telephone Encounter (Signed)
Attempted to contact patient r/t LDCT Screening follow up due at this time.  No answer received, message left for patient to call [phone] to schedule appointment.    

## 2018-07-19 DIAGNOSIS — I5022 Chronic systolic (congestive) heart failure: Secondary | ICD-10-CM | POA: Diagnosis not present

## 2018-07-19 DIAGNOSIS — J449 Chronic obstructive pulmonary disease, unspecified: Secondary | ICD-10-CM | POA: Diagnosis not present

## 2018-07-19 DIAGNOSIS — G4700 Insomnia, unspecified: Secondary | ICD-10-CM | POA: Diagnosis not present

## 2018-07-19 DIAGNOSIS — M1990 Unspecified osteoarthritis, unspecified site: Secondary | ICD-10-CM | POA: Diagnosis not present

## 2018-07-19 DIAGNOSIS — H3530 Unspecified macular degeneration: Secondary | ICD-10-CM | POA: Diagnosis not present

## 2018-07-19 DIAGNOSIS — Z48812 Encounter for surgical aftercare following surgery on the circulatory system: Secondary | ICD-10-CM | POA: Diagnosis not present

## 2018-07-19 DIAGNOSIS — I110 Hypertensive heart disease with heart failure: Secondary | ICD-10-CM | POA: Diagnosis not present

## 2018-07-19 DIAGNOSIS — F3342 Major depressive disorder, recurrent, in full remission: Secondary | ICD-10-CM | POA: Diagnosis not present

## 2018-07-19 DIAGNOSIS — I471 Supraventricular tachycardia: Secondary | ICD-10-CM | POA: Diagnosis not present

## 2018-07-23 DIAGNOSIS — Z952 Presence of prosthetic heart valve: Secondary | ICD-10-CM | POA: Diagnosis not present

## 2018-07-27 DIAGNOSIS — I700 Atherosclerosis of aorta: Secondary | ICD-10-CM | POA: Diagnosis not present

## 2018-07-27 DIAGNOSIS — K219 Gastro-esophageal reflux disease without esophagitis: Secondary | ICD-10-CM | POA: Diagnosis not present

## 2018-07-27 DIAGNOSIS — I061 Rheumatic aortic insufficiency: Secondary | ICD-10-CM | POA: Diagnosis not present

## 2018-07-27 DIAGNOSIS — I712 Thoracic aortic aneurysm, without rupture: Secondary | ICD-10-CM | POA: Diagnosis not present

## 2018-07-27 DIAGNOSIS — M810 Age-related osteoporosis without current pathological fracture: Secondary | ICD-10-CM | POA: Diagnosis not present

## 2018-07-27 DIAGNOSIS — I5022 Chronic systolic (congestive) heart failure: Secondary | ICD-10-CM | POA: Diagnosis not present

## 2018-07-27 DIAGNOSIS — D696 Thrombocytopenia, unspecified: Secondary | ICD-10-CM | POA: Diagnosis not present

## 2018-07-27 DIAGNOSIS — I10 Essential (primary) hypertension: Secondary | ICD-10-CM | POA: Diagnosis not present

## 2018-07-27 DIAGNOSIS — I471 Supraventricular tachycardia: Secondary | ICD-10-CM | POA: Diagnosis not present

## 2018-07-27 DIAGNOSIS — K52832 Lymphocytic colitis: Secondary | ICD-10-CM | POA: Diagnosis not present

## 2018-08-02 DIAGNOSIS — Z952 Presence of prosthetic heart valve: Secondary | ICD-10-CM | POA: Diagnosis not present

## 2018-08-03 DIAGNOSIS — I350 Nonrheumatic aortic (valve) stenosis: Secondary | ICD-10-CM | POA: Diagnosis not present

## 2018-08-03 DIAGNOSIS — Z952 Presence of prosthetic heart valve: Secondary | ICD-10-CM | POA: Diagnosis not present

## 2018-08-06 DIAGNOSIS — Z952 Presence of prosthetic heart valve: Secondary | ICD-10-CM | POA: Diagnosis not present

## 2018-08-06 DIAGNOSIS — I350 Nonrheumatic aortic (valve) stenosis: Secondary | ICD-10-CM | POA: Diagnosis not present

## 2018-08-07 DIAGNOSIS — Z952 Presence of prosthetic heart valve: Secondary | ICD-10-CM | POA: Diagnosis not present

## 2018-08-09 DIAGNOSIS — Z952 Presence of prosthetic heart valve: Secondary | ICD-10-CM | POA: Diagnosis not present

## 2018-08-13 DIAGNOSIS — Z952 Presence of prosthetic heart valve: Secondary | ICD-10-CM | POA: Diagnosis not present

## 2018-08-13 DIAGNOSIS — Z9889 Other specified postprocedural states: Secondary | ICD-10-CM | POA: Diagnosis not present

## 2018-08-13 DIAGNOSIS — Z8679 Personal history of other diseases of the circulatory system: Secondary | ICD-10-CM | POA: Diagnosis not present

## 2018-08-14 DIAGNOSIS — Z8679 Personal history of other diseases of the circulatory system: Secondary | ICD-10-CM | POA: Diagnosis not present

## 2018-08-14 DIAGNOSIS — Z9889 Other specified postprocedural states: Secondary | ICD-10-CM | POA: Diagnosis not present

## 2018-08-14 DIAGNOSIS — Z952 Presence of prosthetic heart valve: Secondary | ICD-10-CM | POA: Diagnosis not present

## 2018-08-16 DIAGNOSIS — Z952 Presence of prosthetic heart valve: Secondary | ICD-10-CM | POA: Diagnosis not present

## 2018-08-16 DIAGNOSIS — Z9889 Other specified postprocedural states: Secondary | ICD-10-CM | POA: Diagnosis not present

## 2018-08-16 DIAGNOSIS — Z8679 Personal history of other diseases of the circulatory system: Secondary | ICD-10-CM | POA: Diagnosis not present

## 2018-08-20 DIAGNOSIS — Z952 Presence of prosthetic heart valve: Secondary | ICD-10-CM | POA: Diagnosis not present

## 2018-08-20 DIAGNOSIS — I350 Nonrheumatic aortic (valve) stenosis: Secondary | ICD-10-CM | POA: Diagnosis not present

## 2018-08-20 DIAGNOSIS — Z9889 Other specified postprocedural states: Secondary | ICD-10-CM | POA: Diagnosis not present

## 2018-08-20 DIAGNOSIS — Z8679 Personal history of other diseases of the circulatory system: Secondary | ICD-10-CM | POA: Diagnosis not present

## 2018-08-21 DIAGNOSIS — I712 Thoracic aortic aneurysm, without rupture: Secondary | ICD-10-CM | POA: Diagnosis not present

## 2018-08-21 DIAGNOSIS — Z8679 Personal history of other diseases of the circulatory system: Secondary | ICD-10-CM | POA: Diagnosis not present

## 2018-08-21 DIAGNOSIS — Z9889 Other specified postprocedural states: Secondary | ICD-10-CM | POA: Diagnosis not present

## 2018-08-21 DIAGNOSIS — Z952 Presence of prosthetic heart valve: Secondary | ICD-10-CM | POA: Diagnosis not present

## 2018-08-21 DIAGNOSIS — Z8619 Personal history of other infectious and parasitic diseases: Secondary | ICD-10-CM | POA: Diagnosis not present

## 2018-08-21 DIAGNOSIS — I5022 Chronic systolic (congestive) heart failure: Secondary | ICD-10-CM | POA: Diagnosis not present

## 2018-08-21 DIAGNOSIS — K52832 Lymphocytic colitis: Secondary | ICD-10-CM | POA: Diagnosis not present

## 2018-08-23 DIAGNOSIS — Z9889 Other specified postprocedural states: Secondary | ICD-10-CM | POA: Diagnosis not present

## 2018-08-23 DIAGNOSIS — Z952 Presence of prosthetic heart valve: Secondary | ICD-10-CM | POA: Diagnosis not present

## 2018-08-23 DIAGNOSIS — Z8679 Personal history of other diseases of the circulatory system: Secondary | ICD-10-CM | POA: Diagnosis not present

## 2018-08-27 DIAGNOSIS — Z9889 Other specified postprocedural states: Secondary | ICD-10-CM | POA: Diagnosis not present

## 2018-08-27 DIAGNOSIS — Z8679 Personal history of other diseases of the circulatory system: Secondary | ICD-10-CM | POA: Diagnosis not present

## 2018-08-27 DIAGNOSIS — Z952 Presence of prosthetic heart valve: Secondary | ICD-10-CM | POA: Diagnosis not present

## 2018-08-28 DIAGNOSIS — Z952 Presence of prosthetic heart valve: Secondary | ICD-10-CM | POA: Diagnosis not present

## 2018-08-28 DIAGNOSIS — Z9889 Other specified postprocedural states: Secondary | ICD-10-CM | POA: Diagnosis not present

## 2018-08-28 DIAGNOSIS — Z8679 Personal history of other diseases of the circulatory system: Secondary | ICD-10-CM | POA: Diagnosis not present

## 2018-08-30 DIAGNOSIS — Z952 Presence of prosthetic heart valve: Secondary | ICD-10-CM | POA: Diagnosis not present

## 2018-08-30 DIAGNOSIS — Z9889 Other specified postprocedural states: Secondary | ICD-10-CM | POA: Diagnosis not present

## 2018-08-30 DIAGNOSIS — Z8679 Personal history of other diseases of the circulatory system: Secondary | ICD-10-CM | POA: Diagnosis not present

## 2018-09-03 DIAGNOSIS — Z9889 Other specified postprocedural states: Secondary | ICD-10-CM | POA: Diagnosis not present

## 2018-09-03 DIAGNOSIS — Z952 Presence of prosthetic heart valve: Secondary | ICD-10-CM | POA: Diagnosis not present

## 2018-09-03 DIAGNOSIS — Z8679 Personal history of other diseases of the circulatory system: Secondary | ICD-10-CM | POA: Diagnosis not present

## 2018-09-04 DIAGNOSIS — Z8679 Personal history of other diseases of the circulatory system: Secondary | ICD-10-CM | POA: Diagnosis not present

## 2018-09-04 DIAGNOSIS — Z9889 Other specified postprocedural states: Secondary | ICD-10-CM | POA: Diagnosis not present

## 2018-09-04 DIAGNOSIS — Z952 Presence of prosthetic heart valve: Secondary | ICD-10-CM | POA: Diagnosis not present

## 2018-09-05 DIAGNOSIS — I5022 Chronic systolic (congestive) heart failure: Secondary | ICD-10-CM | POA: Diagnosis not present

## 2018-09-05 DIAGNOSIS — Z9889 Other specified postprocedural states: Secondary | ICD-10-CM | POA: Diagnosis not present

## 2018-09-05 DIAGNOSIS — I061 Rheumatic aortic insufficiency: Secondary | ICD-10-CM | POA: Diagnosis not present

## 2018-09-05 DIAGNOSIS — I700 Atherosclerosis of aorta: Secondary | ICD-10-CM | POA: Diagnosis not present

## 2018-09-05 DIAGNOSIS — I712 Thoracic aortic aneurysm, without rupture: Secondary | ICD-10-CM | POA: Diagnosis not present

## 2018-09-05 DIAGNOSIS — I471 Supraventricular tachycardia: Secondary | ICD-10-CM | POA: Diagnosis not present

## 2018-09-05 DIAGNOSIS — Z952 Presence of prosthetic heart valve: Secondary | ICD-10-CM | POA: Diagnosis not present

## 2018-09-05 DIAGNOSIS — Z8679 Personal history of other diseases of the circulatory system: Secondary | ICD-10-CM | POA: Diagnosis not present

## 2018-09-05 DIAGNOSIS — I10 Essential (primary) hypertension: Secondary | ICD-10-CM | POA: Diagnosis not present

## 2018-09-06 DIAGNOSIS — Z952 Presence of prosthetic heart valve: Secondary | ICD-10-CM | POA: Diagnosis not present

## 2018-09-06 DIAGNOSIS — Z9889 Other specified postprocedural states: Secondary | ICD-10-CM | POA: Diagnosis not present

## 2018-09-06 DIAGNOSIS — I350 Nonrheumatic aortic (valve) stenosis: Secondary | ICD-10-CM | POA: Diagnosis not present

## 2018-09-06 DIAGNOSIS — Z8679 Personal history of other diseases of the circulatory system: Secondary | ICD-10-CM | POA: Diagnosis not present

## 2018-09-12 DIAGNOSIS — I2510 Atherosclerotic heart disease of native coronary artery without angina pectoris: Secondary | ICD-10-CM | POA: Diagnosis not present

## 2018-09-12 DIAGNOSIS — Z952 Presence of prosthetic heart valve: Secondary | ICD-10-CM | POA: Diagnosis not present

## 2018-09-13 DIAGNOSIS — Z952 Presence of prosthetic heart valve: Secondary | ICD-10-CM | POA: Diagnosis not present

## 2018-09-20 DIAGNOSIS — Z952 Presence of prosthetic heart valve: Secondary | ICD-10-CM | POA: Diagnosis not present

## 2018-09-20 DIAGNOSIS — I350 Nonrheumatic aortic (valve) stenosis: Secondary | ICD-10-CM | POA: Diagnosis not present

## 2018-10-22 DIAGNOSIS — I10 Essential (primary) hypertension: Secondary | ICD-10-CM | POA: Diagnosis not present

## 2018-10-22 DIAGNOSIS — D696 Thrombocytopenia, unspecified: Secondary | ICD-10-CM | POA: Diagnosis not present

## 2018-10-22 DIAGNOSIS — I700 Atherosclerosis of aorta: Secondary | ICD-10-CM | POA: Diagnosis not present

## 2018-10-22 DIAGNOSIS — I471 Supraventricular tachycardia: Secondary | ICD-10-CM | POA: Diagnosis not present

## 2018-10-22 DIAGNOSIS — I712 Thoracic aortic aneurysm, without rupture: Secondary | ICD-10-CM | POA: Diagnosis not present

## 2018-10-25 DIAGNOSIS — I471 Supraventricular tachycardia: Secondary | ICD-10-CM | POA: Diagnosis not present

## 2018-10-25 DIAGNOSIS — D696 Thrombocytopenia, unspecified: Secondary | ICD-10-CM | POA: Diagnosis not present

## 2018-10-25 DIAGNOSIS — I712 Thoracic aortic aneurysm, without rupture: Secondary | ICD-10-CM | POA: Diagnosis not present

## 2018-10-25 DIAGNOSIS — I700 Atherosclerosis of aorta: Secondary | ICD-10-CM | POA: Diagnosis not present

## 2018-10-25 DIAGNOSIS — I5022 Chronic systolic (congestive) heart failure: Secondary | ICD-10-CM | POA: Diagnosis not present

## 2018-10-25 DIAGNOSIS — I10 Essential (primary) hypertension: Secondary | ICD-10-CM | POA: Diagnosis not present

## 2018-10-29 DIAGNOSIS — D696 Thrombocytopenia, unspecified: Secondary | ICD-10-CM | POA: Diagnosis not present

## 2018-10-29 DIAGNOSIS — K52832 Lymphocytic colitis: Secondary | ICD-10-CM | POA: Diagnosis not present

## 2018-10-29 DIAGNOSIS — I10 Essential (primary) hypertension: Secondary | ICD-10-CM | POA: Diagnosis not present

## 2018-10-29 DIAGNOSIS — I712 Thoracic aortic aneurysm, without rupture: Secondary | ICD-10-CM | POA: Diagnosis not present

## 2018-10-29 DIAGNOSIS — I700 Atherosclerosis of aorta: Secondary | ICD-10-CM | POA: Diagnosis not present

## 2018-10-29 DIAGNOSIS — M810 Age-related osteoporosis without current pathological fracture: Secondary | ICD-10-CM | POA: Diagnosis not present

## 2018-10-29 DIAGNOSIS — I471 Supraventricular tachycardia: Secondary | ICD-10-CM | POA: Diagnosis not present

## 2018-10-29 DIAGNOSIS — I5022 Chronic systolic (congestive) heart failure: Secondary | ICD-10-CM | POA: Diagnosis not present

## 2018-10-29 DIAGNOSIS — M79641 Pain in right hand: Secondary | ICD-10-CM | POA: Diagnosis not present

## 2018-10-29 DIAGNOSIS — K219 Gastro-esophageal reflux disease without esophagitis: Secondary | ICD-10-CM | POA: Diagnosis not present

## 2018-12-04 DIAGNOSIS — I471 Supraventricular tachycardia: Secondary | ICD-10-CM | POA: Diagnosis not present

## 2018-12-04 DIAGNOSIS — I5022 Chronic systolic (congestive) heart failure: Secondary | ICD-10-CM | POA: Diagnosis not present

## 2018-12-04 DIAGNOSIS — I10 Essential (primary) hypertension: Secondary | ICD-10-CM | POA: Diagnosis not present

## 2018-12-04 DIAGNOSIS — M810 Age-related osteoporosis without current pathological fracture: Secondary | ICD-10-CM | POA: Diagnosis not present

## 2018-12-04 DIAGNOSIS — I700 Atherosclerosis of aorta: Secondary | ICD-10-CM | POA: Diagnosis not present

## 2018-12-04 DIAGNOSIS — I712 Thoracic aortic aneurysm, without rupture: Secondary | ICD-10-CM | POA: Diagnosis not present

## 2018-12-04 DIAGNOSIS — K52832 Lymphocytic colitis: Secondary | ICD-10-CM | POA: Diagnosis not present

## 2018-12-04 DIAGNOSIS — K219 Gastro-esophageal reflux disease without esophagitis: Secondary | ICD-10-CM | POA: Diagnosis not present

## 2018-12-04 DIAGNOSIS — D696 Thrombocytopenia, unspecified: Secondary | ICD-10-CM | POA: Diagnosis not present

## 2018-12-25 DIAGNOSIS — I712 Thoracic aortic aneurysm, without rupture: Secondary | ICD-10-CM | POA: Diagnosis not present

## 2018-12-25 DIAGNOSIS — I471 Supraventricular tachycardia: Secondary | ICD-10-CM | POA: Diagnosis not present

## 2018-12-25 DIAGNOSIS — Z952 Presence of prosthetic heart valve: Secondary | ICD-10-CM | POA: Diagnosis not present

## 2018-12-25 DIAGNOSIS — D696 Thrombocytopenia, unspecified: Secondary | ICD-10-CM | POA: Diagnosis not present

## 2018-12-25 DIAGNOSIS — I700 Atherosclerosis of aorta: Secondary | ICD-10-CM | POA: Diagnosis not present

## 2018-12-25 DIAGNOSIS — Z9889 Other specified postprocedural states: Secondary | ICD-10-CM | POA: Diagnosis not present

## 2018-12-25 DIAGNOSIS — K52832 Lymphocytic colitis: Secondary | ICD-10-CM | POA: Diagnosis not present

## 2018-12-25 DIAGNOSIS — I10 Essential (primary) hypertension: Secondary | ICD-10-CM | POA: Diagnosis not present

## 2018-12-25 DIAGNOSIS — I5022 Chronic systolic (congestive) heart failure: Secondary | ICD-10-CM | POA: Diagnosis not present

## 2019-01-22 DIAGNOSIS — I10 Essential (primary) hypertension: Secondary | ICD-10-CM | POA: Diagnosis not present

## 2019-01-22 DIAGNOSIS — D696 Thrombocytopenia, unspecified: Secondary | ICD-10-CM | POA: Diagnosis not present

## 2019-01-22 DIAGNOSIS — I712 Thoracic aortic aneurysm, without rupture: Secondary | ICD-10-CM | POA: Diagnosis not present

## 2019-01-22 DIAGNOSIS — I700 Atherosclerosis of aorta: Secondary | ICD-10-CM | POA: Diagnosis not present

## 2019-01-28 DIAGNOSIS — I061 Rheumatic aortic insufficiency: Secondary | ICD-10-CM | POA: Diagnosis not present

## 2019-01-28 DIAGNOSIS — K219 Gastro-esophageal reflux disease without esophagitis: Secondary | ICD-10-CM | POA: Diagnosis not present

## 2019-01-28 DIAGNOSIS — I712 Thoracic aortic aneurysm, without rupture: Secondary | ICD-10-CM | POA: Diagnosis not present

## 2019-01-28 DIAGNOSIS — Z8619 Personal history of other infectious and parasitic diseases: Secondary | ICD-10-CM | POA: Diagnosis not present

## 2019-01-28 DIAGNOSIS — I10 Essential (primary) hypertension: Secondary | ICD-10-CM | POA: Diagnosis not present

## 2019-01-28 DIAGNOSIS — I700 Atherosclerosis of aorta: Secondary | ICD-10-CM | POA: Diagnosis not present

## 2019-01-28 DIAGNOSIS — I471 Supraventricular tachycardia: Secondary | ICD-10-CM | POA: Diagnosis not present

## 2019-01-28 DIAGNOSIS — Z952 Presence of prosthetic heart valve: Secondary | ICD-10-CM | POA: Diagnosis not present

## 2019-01-28 DIAGNOSIS — I5022 Chronic systolic (congestive) heart failure: Secondary | ICD-10-CM | POA: Diagnosis not present

## 2019-01-29 DIAGNOSIS — K52832 Lymphocytic colitis: Secondary | ICD-10-CM | POA: Diagnosis not present

## 2019-01-29 DIAGNOSIS — I471 Supraventricular tachycardia: Secondary | ICD-10-CM | POA: Diagnosis not present

## 2019-01-29 DIAGNOSIS — I700 Atherosclerosis of aorta: Secondary | ICD-10-CM | POA: Diagnosis not present

## 2019-01-29 DIAGNOSIS — M810 Age-related osteoporosis without current pathological fracture: Secondary | ICD-10-CM | POA: Diagnosis not present

## 2019-01-29 DIAGNOSIS — I10 Essential (primary) hypertension: Secondary | ICD-10-CM | POA: Diagnosis not present

## 2019-01-29 DIAGNOSIS — I5022 Chronic systolic (congestive) heart failure: Secondary | ICD-10-CM | POA: Diagnosis not present

## 2019-01-29 DIAGNOSIS — I712 Thoracic aortic aneurysm, without rupture: Secondary | ICD-10-CM | POA: Diagnosis not present

## 2019-01-29 DIAGNOSIS — J449 Chronic obstructive pulmonary disease, unspecified: Secondary | ICD-10-CM | POA: Diagnosis not present

## 2019-01-29 DIAGNOSIS — K219 Gastro-esophageal reflux disease without esophagitis: Secondary | ICD-10-CM | POA: Diagnosis not present

## 2019-01-29 DIAGNOSIS — D696 Thrombocytopenia, unspecified: Secondary | ICD-10-CM | POA: Diagnosis not present

## 2019-02-18 DIAGNOSIS — H353222 Exudative age-related macular degeneration, left eye, with inactive choroidal neovascularization: Secondary | ICD-10-CM | POA: Diagnosis not present

## 2019-02-20 ENCOUNTER — Other Ambulatory Visit
Admission: RE | Admit: 2019-02-20 | Discharge: 2019-02-20 | Disposition: A | Discharge status: Home / Self Care | Payer: Medicare HMO | Source: Ambulatory Visit | Attending: Gastroenterology | Admitting: Gastroenterology

## 2019-02-20 DIAGNOSIS — K52832 Lymphocytic colitis: Secondary | ICD-10-CM | POA: Diagnosis not present

## 2019-02-20 LAB — GASTROINTESTINAL PANEL BY PCR, STOOL (REPLACES STOOL CULTURE)

## 2019-02-20 LAB — C DIFFICILE QUICK SCREEN W PCR REFLEX
C Diff antigen: NEGATIVE
C Diff interpretation: NOT DETECTED
C Diff toxin: NEGATIVE

## 2019-02-21 DIAGNOSIS — E876 Hypokalemia: Secondary | ICD-10-CM | POA: Diagnosis not present

## 2019-03-01 LAB — CALPROTECTIN, FECAL: Calprotectin, Fecal: <16 ug/g (ref 0–120)

## 2019-04-16 ENCOUNTER — Emergency Department
Admission: EM | Admit: 2019-04-16 | Discharge: 2019-04-17 | Disposition: A | Discharge status: Home / Self Care | Payer: Medicare HMO | Attending: Emergency Medicine | Admitting: Emergency Medicine

## 2019-04-16 ENCOUNTER — Telehealth: Payer: Self-pay

## 2019-04-16 ENCOUNTER — Emergency Department: Payer: Medicare HMO

## 2019-04-16 ENCOUNTER — Other Ambulatory Visit: Payer: Self-pay

## 2019-04-16 ENCOUNTER — Encounter: Payer: Self-pay | Admitting: Emergency Medicine

## 2019-04-16 DIAGNOSIS — R748 Abnormal levels of other serum enzymes: Secondary | ICD-10-CM | POA: Insufficient documentation

## 2019-04-16 DIAGNOSIS — I712 Thoracic aortic aneurysm, without rupture: Secondary | ICD-10-CM | POA: Diagnosis not present

## 2019-04-16 DIAGNOSIS — I959 Hypotension, unspecified: Secondary | ICD-10-CM | POA: Diagnosis not present

## 2019-04-16 DIAGNOSIS — R0902 Hypoxemia: Secondary | ICD-10-CM | POA: Diagnosis not present

## 2019-04-16 DIAGNOSIS — R079 Chest pain, unspecified: Secondary | ICD-10-CM | POA: Diagnosis not present

## 2019-04-16 DIAGNOSIS — R0602 Shortness of breath: Secondary | ICD-10-CM | POA: Insufficient documentation

## 2019-04-16 DIAGNOSIS — R0789 Other chest pain: Secondary | ICD-10-CM | POA: Insufficient documentation

## 2019-04-16 DIAGNOSIS — Z87891 Personal history of nicotine dependence: Secondary | ICD-10-CM | POA: Diagnosis not present

## 2019-04-16 DIAGNOSIS — R945 Abnormal results of liver function studies: Secondary | ICD-10-CM | POA: Diagnosis not present

## 2019-04-16 DIAGNOSIS — I10 Essential (primary) hypertension: Secondary | ICD-10-CM | POA: Insufficient documentation

## 2019-04-16 DIAGNOSIS — E876 Hypokalemia: Secondary | ICD-10-CM | POA: Diagnosis not present

## 2019-04-16 DIAGNOSIS — Z79899 Other long term (current) drug therapy: Secondary | ICD-10-CM | POA: Insufficient documentation

## 2019-04-16 LAB — CBC WITH DIFFERENTIAL/PLATELET
Abs Immature Granulocytes: 0.02 10*3/uL (ref 0.00–0.07)
Basophils Absolute: 0 10*3/uL (ref 0.0–0.1)
Basophils Relative: 0 %
Eosinophils Absolute: 0 10*3/uL (ref 0.0–0.5)
Eosinophils Relative: 0 %
HCT: 36.5 % (ref 36.0–46.0)
Hemoglobin: 12.7 g/dL (ref 12.0–15.0)
Immature Granulocytes: 0 %
Lymphocytes Relative: 5 %
Lymphs Abs: 0.5 10*3/uL — ABNORMAL LOW (ref 0.7–4.0)
MCH: 34 pg (ref 26.0–34.0)
MCHC: 34.8 g/dL (ref 30.0–36.0)
MCV: 97.9 fL (ref 80.0–100.0)
Monocytes Absolute: 0.3 10*3/uL (ref 0.1–1.0)
Monocytes Relative: 3 %
Neutro Abs: 8.4 10*3/uL — ABNORMAL HIGH (ref 1.7–7.7)
Neutrophils Relative %: 92 %
Platelets: 155 10*3/uL (ref 150–400)
RBC: 3.73 MIL/uL — ABNORMAL LOW (ref 3.87–5.11)
RDW: 11.6 % (ref 11.5–15.5)
WBC: 9.2 10*3/uL (ref 4.0–10.5)
nRBC: 0 % (ref 0.0–0.2)

## 2019-04-16 LAB — COMPREHENSIVE METABOLIC PANEL
ALT: 117 U/L — ABNORMAL HIGH (ref 0–44)
AST: 217 U/L — ABNORMAL HIGH (ref 15–41)
Albumin: 4.4 g/dL (ref 3.5–5.0)
Alkaline Phosphatase: 105 U/L (ref 38–126)
Anion gap: 15 (ref 5–15)
BUN: 20 mg/dL (ref 8–23)
CO2: 24 mmol/L (ref 22–32)
Calcium: 9.3 mg/dL (ref 8.9–10.3)
Chloride: 95 mmol/L — ABNORMAL LOW (ref 98–111)
Creatinine, Ser: 0.59 mg/dL (ref 0.44–1.00)
GFR calc Af Amer: >60 mL/min (ref >60)
GFR calc non Af Amer: >60 mL/min (ref >60)
Glucose, Bld: 113 mg/dL — ABNORMAL HIGH (ref 70–99)
Potassium: 2.4 mmol/L — CL (ref 3.5–5.1)
Sodium: 134 mmol/L — ABNORMAL LOW (ref 135–145)
Total Bilirubin: 1 mg/dL (ref 0.3–1.2)
Total Protein: 6.9 g/dL (ref 6.5–8.1)

## 2019-04-16 LAB — BRAIN NATRIURETIC PEPTIDE: B Natriuretic Peptide: 26 pg/mL (ref 0.0–100.0)

## 2019-04-16 LAB — TROPONIN I (HIGH SENSITIVITY)
Troponin I (High Sensitivity): 4 ng/L (ref <18)
Troponin I (High Sensitivity): 6 ng/L (ref <18)

## 2019-04-16 LAB — FIBRIN DERIVATIVES D-DIMER (ARMC ONLY): Fibrin derivatives D-dimer (ARMC): 1692.5 ng/mL (FEU) — ABNORMAL HIGH (ref 0.00–499.00)

## 2019-04-16 MED ORDER — IOHEXOL 350 MG/ML SOLN
100.0000 mL | Freq: Once | INTRAVENOUS | Status: AC | PRN
  Administered 2019-04-16: 100 mL via INTRAVENOUS

## 2019-04-16 MED ORDER — METOPROLOL TARTRATE 25 MG PO TABS
12.5000 mg | ORAL_TABLET | Freq: Once | ORAL | Status: AC
  Administered 2019-04-16: 20:00:00 12.5 mg via ORAL
  Filled 2019-04-16: qty 1

## 2019-04-16 MED ORDER — LABETALOL HCL 5 MG/ML IV SOLN
5.0000 mg | Freq: Once | INTRAVENOUS | Status: DC
  Filled 2019-04-16: qty 4

## 2019-04-16 MED ORDER — POTASSIUM CHLORIDE CRYS ER 20 MEQ PO TBCR
40.0000 meq | EXTENDED_RELEASE_TABLET | Freq: Once | ORAL | Status: AC
  Administered 2019-04-16: 22:00:00 40 meq via ORAL
  Filled 2019-04-16: qty 2

## 2019-04-16 MED ORDER — POTASSIUM CHLORIDE 10 MEQ/100ML IV SOLN
10.0000 meq | INTRAVENOUS | Status: DC
  Administered 2019-04-16: 10 meq via INTRAVENOUS
  Filled 2019-04-16 (×3): qty 100

## 2019-04-16 NOTE — ED Triage Notes (Signed)
Pt from home via [facility]. Per EMS, pt c/o acute left sided CP started at 1815 today. Pt st CP radiated to back, diaphoretic, SHOB. Hx of valve replacement at [facility]. Pt present to Ed with NAD noted upon arrival. MD Malinda at bedside upon arrival.

## 2019-04-16 NOTE — ED Provider Notes (Addendum)
[facility] Emergency Department Provider Note   ____________________________________________   First MD Initiated Contact with Patient 04/16/19 1924     (approximate)  I have reviewed the triage vital signs and the nursing notes.   HISTORY  Chief Complaint Chest Pain    HPI April Rogers is a 78 y.o. female patient reports sudden onset of chest pain and tightness in the center of her chest.  Happened earlier today at 78.  Is not radiating anywhere.  Patient told the EMS people and the triage nurse that it radiated to her back.  She told me it did not.  She said she was diaphoretic and short of breath.  She had a history of 2 valve replacements at [facility].  These are tissue valves.         Past Medical History:  Diagnosis Date   Anxiety    Arthritis    Cancer ([place])    skin cancer   Depression    Fracture of one rib    GERD (gastroesophageal reflux disease)    was on nexium but no longer needs to take this medication   Hypertension    Macular degeneration of both eyes     Patient Active Problem List   Diagnosis Date Noted   Femur fracture, right, closed, initial encounter 11/25/2015    Past Surgical History:  Procedure Laterality Date   ARM SKIN LESION BIOPSY / EXCISION Right    CHOLECYSTECTOMY     COLONOSCOPY WITH PROPOFOL N/A 08/03/2015   Procedure: COLONOSCOPY WITH PROPOFOL;  Surgeon: Hulen Luster, MD;  Location: [facility] ENDOSCOPY;  Service: Gastroenterology;  Laterality: N/A;   INTRAMEDULLARY (IM) NAIL INTERTROCHANTERIC Right 11/26/2015   Procedure: INTRAMEDULLARY (IM) NAIL INTERTROCHANTRIC;  Surgeon: Earnestine Leys, MD;  Location: [facility] ORS;  Service: Orthopedics;  Laterality: Right;   TONSILLECTOMY     TUBAL LIGATION     WRIST RECONSTRUCTION Right     Prior to Admission medications   Medication Sig Start Date End Date Taking? Authorizing Provider  atorvastatin (LIPITOR) 20 MG tablet Take 20 mg by mouth daily.    [provider]  b complex vitamins tablet Take 1 tablet by mouth daily.    [provider]  budesonide (ENTOCORT EC) 3 MG 24 hr capsule Take 3 mg by mouth daily.    [provider]  Cholecalciferol 2000 units CAPS Take 2,000 Units by mouth daily.     [provider]  colestipol (COLESTID) 1 g tablet Take 1 g by mouth daily.    [provider]  enoxaparin (LOVENOX) 40 MG/0.4ML injection Inject 0.4 mLs (40 mg total) into the skin daily. Patient not taking: Reported on 03/16/2018 11/27/15   Hillary Bow, MD  ferrous sulfate 325 (65 FE) MG tablet Take 1 tablet (325 mg total) by mouth daily with breakfast. Patient not taking: Reported on 03/16/2018 11/27/15   Hillary Bow, MD  HYDROcodone-acetaminophen (NORCO/VICODIN) 5-325 MG tablet Take 1-2 tablets by mouth every 6 (six) hours as needed for moderate pain. Patient not taking: Reported on 03/16/2018 11/27/15   Hillary Bow, MD  ibuprofen (ADVIL,MOTRIN) 200 MG tablet Take 200 mg by mouth every 6 (six) hours as needed for mild pain.     [provider]  lisinopril (PRINIVIL,ZESTRIL) 20 MG tablet Take 20 mg by mouth daily.    [provider]  metoprolol tartrate (LOPRESSOR) 25 MG tablet Take 0.5 tablets (12.5 mg total) by mouth 2 (two) times daily. 03/16/18 04/15/18  Corky Downs,  Herbie Baltimore, MD  Multiple Vitamins-Minerals (CENTRUM SILVER PO) Take 1 tablet by mouth daily.    [provider]  Multiple Vitamins-Minerals (PRESERVISION AREDS 2 PO) Take 1 tablet by mouth 2 (two) times daily.    [provider]  polyethylene glycol (MIRALAX / GLYCOLAX) packet Take 17 g by mouth daily as needed for mild constipation.    [provider]  senna (SENOKOT) 8.6 MG TABS tablet Take 1 tablet (8.6 mg total) by mouth 2 (two) times daily. Patient taking differently: Take 1 tablet by mouth daily as needed for mild constipation.  11/27/15   Hillary Bow, MD  venlafaxine XR (EFFEXOR-XR) 150 MG 24 hr capsule  Take 150 mg by mouth 2 (two) times daily.     [provider]    Allergies Penicillins, Actonel [risedronate], Alendronate, Codeine, Meloxicam, Percocet [oxycodone-acetaminophen], Varenicline, Wellbutrin [bupropion], and Wine [alcohol]  Family History  Problem Relation Age of Onset   Hypertension Other    Breast cancer Neg Hx     Social History Social History   Tobacco Use   Smoking status: Former Smoker    Packs/day: 1.00    Years: 50.00    Pack years: 50.00    Types: Cigarettes    Quit date: 04/10/2018    Years since quitting: 1.0   Smokeless tobacco: Never Used  Substance Use Topics   Alcohol use: Yes    Alcohol/week: 14.0 standard drinks    Types: 14 Shots of liquor per week   Drug use: No    Review of Systems  Constitutional: No fever/chills Eyes: No visual changes. ENT: No sore throat. Cardiovascular: chest pain. Respiratory:shortness of breath. Gastrointestinal: No abdominal pain.  No nausea, no vomiting.  No diarrhea.  No constipation. Genitourinary: Negative for dysuria. Musculoskeletal: Negative for back pain. Skin: Negative for rash. Neurological: Negative for headaches, focal weakness   ____________________________________________   PHYSICAL EXAM:  VITAL SIGNS: ED Triage Vitals  Enc Vitals Group     BP 04/16/19 1931 (!) 128/53     Pulse Rate 04/16/19 1931 84     Resp --      Temp 04/16/19 1931 98.1 F (36.7 C)     Temp Source 04/16/19 1931 Oral     SpO2 04/16/19 1931 99 %     Weight 04/16/19 1932 135 lb 9.3 oz (61.5 kg)     Height 04/16/19 1932 5\' 3"  (1.6 m)     Head Circumference --      Peak Flow --      Pain Score 04/16/19 1932 7     Pain Loc --      Pain Edu? --      Excl. in [place]? --     Constitutional: Alert and oriented. Well appearing and in no acute distress. Eyes: Conjunctivae are normal.  Head: Atraumatic. Nose: No congestion/rhinnorhea. Mouth/Throat: Mucous membranes are moist.  Oropharynx  non-erythematous. Neck: No stridor.   Cardiovascular: Normal rate, regular rhythm. Grossly normal heart sounds.  Good peripheral circulation. Respiratory: Normal respiratory effort.  No retractions. Lungs CTAB. Gastrointestinal: Soft and nontender. No distention. No abdominal bruits. No CVA tenderness. Musculoskeletal: No lower extremity tenderness nor edema.   Neurologic:  Normal speech and language. No gross focal neurologic deficits are appreciated Skin:  Skin is warm, dry and intact. No rash noted.   ____________________________________________   LABS (all labs ordered are listed, but only abnormal results are displayed)  Labs Reviewed  COMPREHENSIVE METABOLIC PANEL - Abnormal; Notable for the following components:  Result Value   Sodium 134 (*)    Potassium 2.4 (*)    Chloride 95 (*)    Glucose, Bld 113 (*)    AST 217 (*)    ALT 117 (*)    All other components within normal limits  CBC WITH DIFFERENTIAL/PLATELET - Abnormal; Notable for the following components:   RBC 3.73 (*)    Neutro Abs 8.4 (*)    Lymphs Abs 0.5 (*)    All other components within normal limits  FIBRIN DERIVATIVES D-DIMER ([facility] ONLY) - Abnormal; Notable for the following components:   Fibrin derivatives D-dimer [facility]) 1,692.50 (*)    All other components within normal limits  TROPONIN I (HIGH SENSITIVITY)  TROPONIN I (HIGH SENSITIVITY)  TROPONIN I (HIGH SENSITIVITY)   ____________________________________________  EKG  EKG read and interpreted by me shows normal sinus rhythm rate of 87 normal axis flipped T's inferiorly and in the lateral chest leads.  These are new since September 2019 ____________________________________________  RADIOLOGY  ED MD interpretation: Chest x-ray read by radiology reviewed by me shows a large aorta.  Radiology thinks it could be pulmonary vascular congestion as well.  CT angios done because of patient's history and appearance of the aorta on the chest x-ray shows a  thoracic aortic aneurysm of 4.6 cm.  This was discussed with Dr. Delana Meyer and compared to old records.  It seems to be stable at least.  Official radiology report(s): Dg Chest Portable 1 View  Result Date: 04/16/2019 CLINICAL DATA:  Chest pain EXAM: PORTABLE CHEST 1 VIEW COMPARISON:  March 16, 2018 FINDINGS: The heart size and mediastinal contours are within normal limits. Prosthetic aortic valve and overlying median sternotomy are seen. Aortic knob calcifications. There is prominence to the pulmonary vasculature throughout. No pleural effusion. No acute osseous abnormality. IMPRESSION: Pulmonary vascular congestion. Electronically Signed   By: Prudencio Pair M.D.   On: 04/16/2019 20:03   Ct Angio Chest Aorta W And/or Wo Contrast  Result Date: 04/16/2019 CLINICAL DATA:  Chest pain EXAM: CT ANGIOGRAPHY CHEST WITH CONTRAST TECHNIQUE: Multidetector CT imaging of the chest was performed using the standard protocol during bolus administration of intravenous contrast. Multiplanar CT image reconstructions and MIPs were obtained to evaluate the vascular anatomy. CONTRAST:  154mL OMNIPAQUE IOHEXOL 350 MG/ML SOLN COMPARISON:  July 25, 2017 FINDINGS: Cardiovascular: There is a optimal opacification of the pulmonary arteries. There is no central,segmental, or subsegmental filling defects within the pulmonary arteries. The heart is normal in size. A prosthetic aortic valve is seen. Again noted is fusiform aneurysmal dilatation of the posterior aortic arch at the aortic arch/descending intrathoracic aorta at junction measuring up to 4.6 cm, and previously 4.5 cm. This aneurysmal dilatation tapers at the level of the proximal descending arch which measures 3.5 cm in dimension. No aortic dissection. There is normal three-vessel brachiocephalic anatomy without proximal stenosis. Scattered intrathoracic calcified and noncalcified plaque is seen. Mediastinum/Nodes: No hilar, mediastinal, or axillary adenopathy. Thyroid  gland, trachea, and esophagus demonstrate no significant findings. Lungs/Pleura: Centrilobular emphysematous changes are seen at both lung apices. The lungs are otherwise clear. No focal airspace consolidation or pleural effusion. Upper Abdomen: No acute abnormalities present in the visualized portions of the upper abdomen. Musculoskeletal: Overlying median sternotomy wires are seen. No acute or significant osseous findings. Review of the MIP images confirms the above findings. IMPRESSION: Fusiform aneurysmal dilatation of the posterior aortic arch/descending intrathoracic aorta junction measuring up to 4.6 cm in transverse dimension, and previously 4.5 cm. This tapers  at the level of the proximal descending intrathoracic aorta. No aortic dissection. No central, segmental, or subsegmental pulmonary embolism. A prosthetic aortic valve. Mild centrilobular emphysematous changes within both lungs. Electronically Signed   By: Prudencio Pair M.D.   On: 04/16/2019 21:06    ____________________________________________   PROCEDURES  Procedure(s) performed (including Critical Care): Critical care time 35 minutes.  This includes reviewing the old records discussing the patient with the hospitalist and with Dr. Delana Meyer vascular here.  We are still attempting to get a hold of cardiothoracic surgery at [facility].  I have discussed the patient with the intake nurse at [facility].  Procedures   ____________________________________________   INITIAL IMPRESSION / ASSESSMENT AND PLAN / ED COURSE  April Rogers was evaluated in Emergency Department on 04/16/2019 for the symptoms described in the history of present illness. She was evaluated in the context of the global COVID-19 pandemic, which necessitated consideration that the patient might be at risk for infection with the SARS-CoV-2 virus that causes COVID-19. Institutional protocols and algorithms that pertain to the evaluation of patients at risk for COVID-19 are in a state  of rapid change based on information released by regulatory bodies including the CDC and federal and state organizations. These policies and algorithms were followed during the patient's care in the ED.    Discussed patient with Dr. Delana Meyer who looked at the films.  He does not feel that there is any need for acute surgery tonight.  We will attempt to get a hold of her surgeon Dr. Cheree Ditto at [facility].  We will repeat her troponin make sure that is okay and see if we can arrange close follow-up with her surgeon at [facility] and possibly with cardiology as well.  I will sign the patient out to Dr. Ellender Hose.          ____________________________________________   FINAL CLINICAL IMPRESSION(S) / ED DIAGNOSES  Final diagnoses:  Chest pain, unspecified type     ED Discharge Orders    None       Note:  This document was prepared using Dragon voice recognition software and may include unintentional dictation errors.    Nena Polio, MD 04/16/19 2138    Nena Polio, MD 04/16/19 2202 I will sign the patient out to Dr. Ellender Hose.   Nena Polio, MD 04/16/19 2202

## 2019-04-16 NOTE — ED Notes (Signed)
ED Provider Malinda at bedside to provide update to pt.

## 2019-04-16 NOTE — ED Provider Notes (Signed)
78 yo F here with mild, transient CP, now resolved. Trop negative, EKG is non-ischemic. Labs show mild hypokalemia which has been repleted. No EKG changes. Pt did start HCTZ recently. CT scan shows aneurysmal dilatation of proximal ao- known h.o same s/p AvR and proximal root surgery. Plan to fu repeat trop, discuss with [facility]. LIkely d/c if neg.  --------------  Discussed with [facility] on Call Dr. Jimmye Norman - no surgical intervention required, similar to prior images. Will f/u as outpt. Otherwise, K replaced, EKG remains normal. She is requesting dc and is pain free. Repeat trop 6. Will d/c w/ supplemental K, outpt follow-up.   Duffy Bruce, MD 04/17/19 [phone]

## 2019-04-16 NOTE — ED Notes (Signed)
Pt ambulatory to toilet with 1x person assistance. Pt void 268ml

## 2019-04-16 NOTE — ED Notes (Signed)
Resumed care from [place] rn. Pt alert.  nsr on monitor.  Family with pt.

## 2019-04-16 NOTE — Telephone Encounter (Signed)
Left voicemail for pt to follow up and see if she was ready to schedule her annual lung cancer screening. Instructed her to call back to confirm information prior to CT scan being scheduled.

## 2019-04-16 NOTE — ED Notes (Signed)
Pt st "my left and right chest hurt". Pt describes pain as pressure at this time. Pt's daughter at bedside at this time.

## 2019-04-17 MED ORDER — POTASSIUM CHLORIDE CRYS ER 20 MEQ PO TBCR
20.0000 meq | EXTENDED_RELEASE_TABLET | Freq: Once | ORAL | Status: AC
  Administered 2019-04-17: 20 meq via ORAL
  Filled 2019-04-17: qty 1

## 2019-04-17 MED ORDER — POTASSIUM CHLORIDE ER 10 MEQ PO TBCR: 20.0000 meq | EXTENDED_RELEASE_TABLET | Freq: Two times a day (BID) | ORAL | 0 refills | Status: DC

## 2019-04-17 NOTE — Discharge Instructions (Signed)
As we discussed, your labs showed low potassium as well as slight elevation in your liver enzymes.  Start taking the potassium that I prescribed.  I would hold the hydrochlorothiazide for the next 2 days, to help replace your potassium.  Call your primary doctor to discuss your lab work and set up a repeat lab appointment in the next 5 to 7 days.  Your CT scan showed slight enlargement of your aorta, which I suspect is due to the surgery.  We discussed this with your surgeon at [facility] who recommends calling to set up a follow-up appointment in the next few weeks.

## 2019-04-17 NOTE — ED Notes (Signed)
Pt reports meds are burning.  md aware.  md in with pt.  n. saline infusing also.

## 2019-04-18 DIAGNOSIS — I700 Atherosclerosis of aorta: Secondary | ICD-10-CM | POA: Diagnosis not present

## 2019-04-18 DIAGNOSIS — Z8619 Personal history of other infectious and parasitic diseases: Secondary | ICD-10-CM | POA: Diagnosis not present

## 2019-04-18 DIAGNOSIS — I5022 Chronic systolic (congestive) heart failure: Secondary | ICD-10-CM | POA: Diagnosis not present

## 2019-04-18 DIAGNOSIS — I712 Thoracic aortic aneurysm, without rupture: Secondary | ICD-10-CM | POA: Diagnosis not present

## 2019-04-18 DIAGNOSIS — R7989 Other specified abnormal findings of blood chemistry: Secondary | ICD-10-CM | POA: Diagnosis not present

## 2019-04-18 DIAGNOSIS — Z9889 Other specified postprocedural states: Secondary | ICD-10-CM | POA: Diagnosis not present

## 2019-04-18 DIAGNOSIS — Z952 Presence of prosthetic heart valve: Secondary | ICD-10-CM | POA: Diagnosis not present

## 2019-04-18 DIAGNOSIS — I061 Rheumatic aortic insufficiency: Secondary | ICD-10-CM | POA: Diagnosis not present

## 2019-04-18 DIAGNOSIS — I10 Essential (primary) hypertension: Secondary | ICD-10-CM | POA: Diagnosis not present

## 2019-04-23 DIAGNOSIS — I10 Essential (primary) hypertension: Secondary | ICD-10-CM | POA: Diagnosis not present

## 2019-04-23 DIAGNOSIS — R748 Abnormal levels of other serum enzymes: Secondary | ICD-10-CM | POA: Diagnosis not present

## 2019-04-23 DIAGNOSIS — I700 Atherosclerosis of aorta: Secondary | ICD-10-CM | POA: Diagnosis not present

## 2019-04-23 DIAGNOSIS — K219 Gastro-esophageal reflux disease without esophagitis: Secondary | ICD-10-CM | POA: Diagnosis not present

## 2019-04-23 DIAGNOSIS — K52832 Lymphocytic colitis: Secondary | ICD-10-CM | POA: Diagnosis not present

## 2019-04-23 DIAGNOSIS — I712 Thoracic aortic aneurysm, without rupture: Secondary | ICD-10-CM | POA: Diagnosis not present

## 2019-04-23 DIAGNOSIS — I5022 Chronic systolic (congestive) heart failure: Secondary | ICD-10-CM | POA: Diagnosis not present

## 2019-04-23 DIAGNOSIS — I110 Hypertensive heart disease with heart failure: Secondary | ICD-10-CM | POA: Diagnosis not present

## 2019-04-23 DIAGNOSIS — F329 Major depressive disorder, single episode, unspecified: Secondary | ICD-10-CM | POA: Diagnosis not present

## 2019-05-08 DIAGNOSIS — I471 Supraventricular tachycardia: Secondary | ICD-10-CM | POA: Diagnosis not present

## 2019-05-08 DIAGNOSIS — I110 Hypertensive heart disease with heart failure: Secondary | ICD-10-CM | POA: Diagnosis not present

## 2019-05-08 DIAGNOSIS — R748 Abnormal levels of other serum enzymes: Secondary | ICD-10-CM | POA: Diagnosis not present

## 2019-05-08 DIAGNOSIS — I712 Thoracic aortic aneurysm, without rupture: Secondary | ICD-10-CM | POA: Diagnosis not present

## 2019-05-08 DIAGNOSIS — M810 Age-related osteoporosis without current pathological fracture: Secondary | ICD-10-CM | POA: Diagnosis not present

## 2019-05-08 DIAGNOSIS — I5022 Chronic systolic (congestive) heart failure: Secondary | ICD-10-CM | POA: Diagnosis not present

## 2019-05-08 DIAGNOSIS — K52832 Lymphocytic colitis: Secondary | ICD-10-CM | POA: Diagnosis not present

## 2019-05-08 DIAGNOSIS — Z23 Encounter for immunization: Secondary | ICD-10-CM | POA: Diagnosis not present

## 2019-05-08 DIAGNOSIS — D696 Thrombocytopenia, unspecified: Secondary | ICD-10-CM | POA: Diagnosis not present

## 2019-05-08 DIAGNOSIS — I700 Atherosclerosis of aorta: Secondary | ICD-10-CM | POA: Diagnosis not present

## 2019-05-27 DIAGNOSIS — Z9889 Other specified postprocedural states: Secondary | ICD-10-CM | POA: Diagnosis not present

## 2019-05-27 DIAGNOSIS — Z87891 Personal history of nicotine dependence: Secondary | ICD-10-CM | POA: Diagnosis not present

## 2019-05-27 DIAGNOSIS — Z7982 Long term (current) use of aspirin: Secondary | ICD-10-CM | POA: Diagnosis not present

## 2019-05-27 DIAGNOSIS — I712 Thoracic aortic aneurysm, without rupture: Secondary | ICD-10-CM | POA: Diagnosis not present

## 2019-05-27 DIAGNOSIS — Z952 Presence of prosthetic heart valve: Secondary | ICD-10-CM | POA: Diagnosis not present

## 2019-05-27 DIAGNOSIS — Z79899 Other long term (current) drug therapy: Secondary | ICD-10-CM | POA: Diagnosis not present

## 2019-05-27 DIAGNOSIS — Z8679 Personal history of other diseases of the circulatory system: Secondary | ICD-10-CM | POA: Diagnosis not present

## 2019-05-27 DIAGNOSIS — I517 Cardiomegaly: Secondary | ICD-10-CM | POA: Diagnosis not present

## 2019-05-27 DIAGNOSIS — Z48812 Encounter for surgical aftercare following surgery on the circulatory system: Secondary | ICD-10-CM | POA: Diagnosis not present

## 2019-05-30 DIAGNOSIS — E785 Hyperlipidemia, unspecified: Secondary | ICD-10-CM | POA: Diagnosis not present

## 2019-06-11 DIAGNOSIS — I712 Thoracic aortic aneurysm, without rupture: Secondary | ICD-10-CM | POA: Diagnosis not present

## 2019-06-11 DIAGNOSIS — I700 Atherosclerosis of aorta: Secondary | ICD-10-CM | POA: Diagnosis not present

## 2019-06-11 DIAGNOSIS — I10 Essential (primary) hypertension: Secondary | ICD-10-CM | POA: Diagnosis not present

## 2019-06-11 DIAGNOSIS — I471 Supraventricular tachycardia: Secondary | ICD-10-CM | POA: Diagnosis not present

## 2019-06-11 DIAGNOSIS — Z9889 Other specified postprocedural states: Secondary | ICD-10-CM | POA: Diagnosis not present

## 2019-06-11 DIAGNOSIS — I5022 Chronic systolic (congestive) heart failure: Secondary | ICD-10-CM | POA: Diagnosis not present

## 2019-06-11 DIAGNOSIS — I061 Rheumatic aortic insufficiency: Secondary | ICD-10-CM | POA: Diagnosis not present

## 2019-07-26 DIAGNOSIS — D696 Thrombocytopenia, unspecified: Secondary | ICD-10-CM | POA: Diagnosis not present

## 2019-07-26 DIAGNOSIS — I712 Thoracic aortic aneurysm, without rupture: Secondary | ICD-10-CM | POA: Diagnosis not present

## 2019-07-26 DIAGNOSIS — I10 Essential (primary) hypertension: Secondary | ICD-10-CM | POA: Diagnosis not present

## 2019-07-26 DIAGNOSIS — I700 Atherosclerosis of aorta: Secondary | ICD-10-CM | POA: Diagnosis not present

## 2019-07-30 DIAGNOSIS — M5440 Lumbago with sciatica, unspecified side: Secondary | ICD-10-CM | POA: Diagnosis not present

## 2019-07-30 DIAGNOSIS — I5022 Chronic systolic (congestive) heart failure: Secondary | ICD-10-CM | POA: Diagnosis not present

## 2019-07-30 DIAGNOSIS — D696 Thrombocytopenia, unspecified: Secondary | ICD-10-CM | POA: Diagnosis not present

## 2019-07-30 DIAGNOSIS — K52832 Lymphocytic colitis: Secondary | ICD-10-CM | POA: Diagnosis not present

## 2019-07-30 DIAGNOSIS — S32010A Wedge compression fracture of first lumbar vertebra, initial encounter for closed fracture: Secondary | ICD-10-CM | POA: Diagnosis not present

## 2019-07-30 DIAGNOSIS — I700 Atherosclerosis of aorta: Secondary | ICD-10-CM | POA: Diagnosis not present

## 2019-07-30 DIAGNOSIS — I10 Essential (primary) hypertension: Secondary | ICD-10-CM | POA: Diagnosis not present

## 2019-07-30 DIAGNOSIS — K219 Gastro-esophageal reflux disease without esophagitis: Secondary | ICD-10-CM | POA: Diagnosis not present

## 2019-07-30 DIAGNOSIS — M810 Age-related osteoporosis without current pathological fracture: Secondary | ICD-10-CM | POA: Diagnosis not present

## 2019-07-30 DIAGNOSIS — I110 Hypertensive heart disease with heart failure: Secondary | ICD-10-CM | POA: Diagnosis not present

## 2019-07-30 DIAGNOSIS — F3342 Major depressive disorder, recurrent, in full remission: Secondary | ICD-10-CM | POA: Diagnosis not present

## 2019-07-30 DIAGNOSIS — I471 Supraventricular tachycardia: Secondary | ICD-10-CM | POA: Diagnosis not present

## 2019-08-01 ENCOUNTER — Other Ambulatory Visit: Payer: Self-pay | Admitting: Internal Medicine

## 2019-08-01 DIAGNOSIS — S32040A Wedge compression fracture of fourth lumbar vertebra, initial encounter for closed fracture: Secondary | ICD-10-CM

## 2019-08-10 ENCOUNTER — Ambulatory Visit
Admission: RE | Admit: 2019-08-10 | Discharge: 2019-08-10 | Disposition: A | Discharge status: Home / Self Care | Payer: Medicare HMO | Source: Ambulatory Visit | Attending: Internal Medicine | Admitting: Internal Medicine

## 2019-08-10 ENCOUNTER — Other Ambulatory Visit: Payer: Self-pay

## 2019-08-10 DIAGNOSIS — M545 Low back pain: Secondary | ICD-10-CM | POA: Diagnosis not present

## 2019-08-10 DIAGNOSIS — S32040A Wedge compression fracture of fourth lumbar vertebra, initial encounter for closed fracture: Secondary | ICD-10-CM | POA: Insufficient documentation

## 2019-08-13 DIAGNOSIS — S32040G Wedge compression fracture of fourth lumbar vertebra, subsequent encounter for fracture with delayed healing: Secondary | ICD-10-CM | POA: Diagnosis not present

## 2019-08-13 DIAGNOSIS — K52832 Lymphocytic colitis: Secondary | ICD-10-CM | POA: Diagnosis not present

## 2019-08-13 DIAGNOSIS — K219 Gastro-esophageal reflux disease without esophagitis: Secondary | ICD-10-CM | POA: Diagnosis not present

## 2019-08-13 DIAGNOSIS — I5022 Chronic systolic (congestive) heart failure: Secondary | ICD-10-CM | POA: Diagnosis not present

## 2019-08-13 DIAGNOSIS — M810 Age-related osteoporosis without current pathological fracture: Secondary | ICD-10-CM | POA: Diagnosis not present

## 2019-08-13 DIAGNOSIS — I712 Thoracic aortic aneurysm, without rupture: Secondary | ICD-10-CM | POA: Diagnosis not present

## 2019-08-13 DIAGNOSIS — I471 Supraventricular tachycardia: Secondary | ICD-10-CM | POA: Diagnosis not present

## 2019-08-13 DIAGNOSIS — I700 Atherosclerosis of aorta: Secondary | ICD-10-CM | POA: Diagnosis not present

## 2019-08-13 DIAGNOSIS — I110 Hypertensive heart disease with heart failure: Secondary | ICD-10-CM | POA: Diagnosis not present

## 2019-08-14 ENCOUNTER — Other Ambulatory Visit
Admission: RE | Admit: 2019-08-14 | Discharge: 2019-08-14 | Disposition: A | Discharge status: Home / Self Care | Payer: Medicare HMO | Source: Ambulatory Visit | Attending: Orthopedic Surgery | Admitting: Orthopedic Surgery

## 2019-08-14 ENCOUNTER — Other Ambulatory Visit: Payer: Self-pay | Admitting: Orthopedic Surgery

## 2019-08-14 ENCOUNTER — Other Ambulatory Visit: Payer: Self-pay

## 2019-08-14 DIAGNOSIS — Z20822 Contact with and (suspected) exposure to covid-19: Secondary | ICD-10-CM | POA: Insufficient documentation

## 2019-08-14 DIAGNOSIS — Z01812 Encounter for preprocedural laboratory examination: Secondary | ICD-10-CM | POA: Diagnosis not present

## 2019-08-14 DIAGNOSIS — S32040A Wedge compression fracture of fourth lumbar vertebra, initial encounter for closed fracture: Secondary | ICD-10-CM | POA: Diagnosis not present

## 2019-08-14 HISTORY — DX: Age-related osteoporosis without current pathological fracture: M81.0

## 2019-08-14 HISTORY — DX: Other complications of anesthesia, initial encounter: T88.59XA

## 2019-08-14 LAB — SARS CORONAVIRUS 2 (TAT 6-24 HRS): SARS Coronavirus 2: NEGATIVE

## 2019-08-14 NOTE — Patient Instructions (Signed)
Your procedure is scheduled on: Friday August 16, 2019 Report to Day Surgery on the 2nd floor of the [facility]. To find out your arrival time, please call [phone] between 1PM - 3PM on: August 15, 2019 Thursday   REMEMBER: Instructions that are not followed completely may result in serious medical risk, up to and including death; or upon the discretion of your surgeon and anesthesiologist your surgery may need to be rescheduled.  Do not eat food after midnight the night before surgery.  No gum chewing, lozengers or hard candies.  You may however, drink CLEAR liquids up to 2 hours before you are scheduled to arrive for your surgery. Do not drink anything within 2 hours of the start of your surgery.  Clear liquids include: - water  - apple juice without pulp -CLEAR gatorade - black coffee or tea (Do NOT add milk or creamers to the coffee or tea) Do NOT drink anything that is not on this list.  Type 1 and Type 2 diabetics should only drink water.  No Alcohol for 24 hours before or after surgery.  No Smoking including e-cigarettes for 24 hours prior to surgery.  No chewable tobacco products for at least 6 hours prior to surgery.  No nicotine patches on the day of surgery.  On the morning of surgery brush your teeth with toothpaste and water, you may rinse your mouth with mouthwash if you wish. Do not swallow any toothpaste or mouthwash.  Notify your doctor if there is any change in your medical condition (cold, fever, infection).  Do not wear jewelry, make-up, hairpins, clips or nail polish.  Do not wear lotions, powders, or perfumes OR DEODORANT  Do not shave 48 hours prior to surgery.   Contacts and dentures may not be worn into surgery.  Do not bring valuables to the hospital, including drivers license, insurance or credit cards.  [facility] is not responsible for any belongings or valuables.   TAKE THESE MEDICATIONS THE MORNING OF  SURGERY: METOPROLOL EFFEXOR OXYCODONE  Follow recommendations from Cardiologist, Pulmonologist or PCP regarding stopping Aspirin, Coumadin, Plavix, Eliquis, Pradaxa, or Pletal.  Stop Anti-inflammatories (NSAIDS) such as Advil, Aleve, Ibuprofen, Motrin, Naproxen, Naprosyn and Aspirin based products such as Excedrin, Goodys Powder, BC Powder. (May take Tylenol or Acetaminophen if needed.)  Stop ANY OVER THE COUNTER supplements until after surgery. (May continue Vitamin D, Vitamin B, and multivitamin.)  Wear comfortable clothing (specific to your surgery type) to the hospital.  Plan for stool softeners for home use.  If you are being discharged the day of surgery, you will not be allowed to drive home. You will need a responsible adult to drive you home and stay with you that night.   If you are taking public transportation, you will need to have a responsible adult with you. Please confirm with your physician that it is acceptable to use public transportation.   Please call [phone] if you have any questions about these instructions.

## 2019-08-14 NOTE — Pre-Procedure Instructions (Signed)
Cardiac clearance form faxed to Dr Bethanne Ginger office

## 2019-08-15 ENCOUNTER — Encounter: Payer: Self-pay | Admitting: Orthopedic Surgery

## 2019-08-15 MED ORDER — CLINDAMYCIN PHOSPHATE 900 MG/50ML IV SOLN
900.0000 mg | INTRAVENOUS | Status: AC
  Administered 2019-08-16: 900 mg via INTRAVENOUS

## 2019-08-16 ENCOUNTER — Encounter: Payer: Self-pay | Admitting: Orthopedic Surgery

## 2019-08-16 ENCOUNTER — Encounter
Admission: RE | Disposition: A | Discharge status: Home / Self Care | Payer: Self-pay | Source: Home / Self Care | Attending: Orthopedic Surgery

## 2019-08-16 ENCOUNTER — Ambulatory Visit
Admission: RE | Admit: 2019-08-16 | Discharge: 2019-08-16 | Disposition: A | Discharge status: Home / Self Care | Payer: Medicare HMO | Attending: Orthopedic Surgery | Admitting: Orthopedic Surgery

## 2019-08-16 ENCOUNTER — Ambulatory Visit: Payer: Medicare HMO | Admitting: Anesthesiology

## 2019-08-16 ENCOUNTER — Ambulatory Visit: Payer: Medicare HMO

## 2019-08-16 ENCOUNTER — Other Ambulatory Visit: Payer: Self-pay

## 2019-08-16 DIAGNOSIS — Z87891 Personal history of nicotine dependence: Secondary | ICD-10-CM | POA: Insufficient documentation

## 2019-08-16 DIAGNOSIS — X58XXXA Exposure to other specified factors, initial encounter: Secondary | ICD-10-CM | POA: Insufficient documentation

## 2019-08-16 DIAGNOSIS — I10 Essential (primary) hypertension: Secondary | ICD-10-CM | POA: Diagnosis not present

## 2019-08-16 DIAGNOSIS — F329 Major depressive disorder, single episode, unspecified: Secondary | ICD-10-CM | POA: Insufficient documentation

## 2019-08-16 DIAGNOSIS — F419 Anxiety disorder, unspecified: Secondary | ICD-10-CM | POA: Diagnosis not present

## 2019-08-16 DIAGNOSIS — S32040A Wedge compression fracture of fourth lumbar vertebra, initial encounter for closed fracture: Secondary | ICD-10-CM | POA: Insufficient documentation

## 2019-08-16 DIAGNOSIS — Z981 Arthrodesis status: Secondary | ICD-10-CM | POA: Diagnosis not present

## 2019-08-16 DIAGNOSIS — Z419 Encounter for procedure for purposes other than remedying health state, unspecified: Secondary | ICD-10-CM

## 2019-08-16 DIAGNOSIS — H3530 Unspecified macular degeneration: Secondary | ICD-10-CM | POA: Insufficient documentation

## 2019-08-16 DIAGNOSIS — S32040G Wedge compression fracture of fourth lumbar vertebra, subsequent encounter for fracture with delayed healing: Secondary | ICD-10-CM | POA: Diagnosis not present

## 2019-08-16 DIAGNOSIS — Z79899 Other long term (current) drug therapy: Secondary | ICD-10-CM | POA: Diagnosis not present

## 2019-08-16 DIAGNOSIS — Z85828 Personal history of other malignant neoplasm of skin: Secondary | ICD-10-CM | POA: Insufficient documentation

## 2019-08-16 HISTORY — PX: KYPHOPLASTY: SHX5884

## 2019-08-16 LAB — POCT I-STAT, CHEM 8
BUN: 14 mg/dL (ref 8–23)
Calcium, Ion: 1.17 mmol/L (ref 1.15–1.40)
Chloride: 97 mmol/L — ABNORMAL LOW (ref 98–111)
Creatinine, Ser: 0.6 mg/dL (ref 0.44–1.00)
Glucose, Bld: 94 mg/dL (ref 70–99)
HCT: 43 % (ref 36.0–46.0)
Hemoglobin: 14.6 g/dL (ref 12.0–15.0)
Potassium: 3.9 mmol/L (ref 3.5–5.1)
Sodium: 136 mmol/L (ref 135–145)
TCO2: 28 mmol/L (ref 22–32)

## 2019-08-16 SURGERY — KYPHOPLASTY
Anesthesia: General

## 2019-08-16 MED ORDER — PROPOFOL 500 MG/50ML IV EMUL
INTRAVENOUS | Status: DC | PRN
  Administered 2019-08-16: 50 ug/kg/min via INTRAVENOUS

## 2019-08-16 MED ORDER — ONDANSETRON HCL 4 MG/2ML IJ SOLN: 4.0000 mg | Freq: Four times a day (QID) | INTRAMUSCULAR | Status: DC | PRN

## 2019-08-16 MED ORDER — EPINEPHRINE PF 1 MG/ML IJ SOLN
INTRAMUSCULAR | Status: AC
  Filled 2019-08-16: qty 1

## 2019-08-16 MED ORDER — LIDOCAINE HCL (PF) 2 % IJ SOLN
INTRAMUSCULAR | Status: AC
  Filled 2019-08-16: qty 10

## 2019-08-16 MED ORDER — BUPIVACAINE-EPINEPHRINE (PF) 0.5% -1:200000 IJ SOLN
INTRAMUSCULAR | Status: DC | PRN
  Administered 2019-08-16: 20 mL

## 2019-08-16 MED ORDER — SODIUM CHLORIDE 0.9 % IV SOLN: INTRAVENOUS | Status: DC

## 2019-08-16 MED ORDER — IOHEXOL 180 MG/ML  SOLN
INTRAMUSCULAR | Status: DC | PRN
  Administered 2019-08-16: 20 mL

## 2019-08-16 MED ORDER — KETAMINE HCL 10 MG/ML IJ SOLN
INTRAMUSCULAR | Status: DC | PRN
  Administered 2019-08-16: 10 mg via INTRAVENOUS
  Administered 2019-08-16: 20 mg via INTRAVENOUS

## 2019-08-16 MED ORDER — METOCLOPRAMIDE HCL 10 MG PO TABS: 5.0000 mg | ORAL_TABLET | Freq: Three times a day (TID) | ORAL | Status: DC | PRN

## 2019-08-16 MED ORDER — FAMOTIDINE 20 MG PO TABS: 20.0000 mg | ORAL_TABLET | Freq: Once | ORAL | Status: AC

## 2019-08-16 MED ORDER — LIDOCAINE HCL 1 % IJ SOLN
INTRAMUSCULAR | Status: DC | PRN
  Administered 2019-08-16: 10 mL
  Administered 2019-08-16: 20 mL

## 2019-08-16 MED ORDER — FENTANYL CITRATE (PF) 100 MCG/2ML IJ SOLN: 25.0000 ug | INTRAMUSCULAR | Status: DC | PRN

## 2019-08-16 MED ORDER — LIDOCAINE HCL (CARDIAC) PF 100 MG/5ML IV SOSY
PREFILLED_SYRINGE | INTRAVENOUS | Status: DC | PRN
  Administered 2019-08-16: 40 mg via INTRAVENOUS
  Administered 2019-08-16: 6 mg via INTRAVENOUS

## 2019-08-16 MED ORDER — DEXMEDETOMIDINE HCL 200 MCG/2ML IV SOLN
INTRAVENOUS | Status: DC | PRN
  Administered 2019-08-16: 12 ug via INTRAVENOUS

## 2019-08-16 MED ORDER — PROPOFOL 10 MG/ML IV BOLUS
INTRAVENOUS | Status: DC | PRN
  Administered 2019-08-16: 20 mg via INTRAVENOUS
  Administered 2019-08-16: 30 mg via INTRAVENOUS
  Administered 2019-08-16: 20 mg via INTRAVENOUS
  Administered 2019-08-16: 30 mg via INTRAVENOUS

## 2019-08-16 MED ORDER — SODIUM CHLORIDE (PF) 0.9 % IJ SOLN
INTRAMUSCULAR | Status: AC
  Filled 2019-08-16: qty 10

## 2019-08-16 MED ORDER — LACTATED RINGERS IV SOLN: INTRAVENOUS | Status: DC

## 2019-08-16 MED ORDER — CLINDAMYCIN PHOSPHATE 900 MG/50ML IV SOLN
INTRAVENOUS | Status: AC
  Filled 2019-08-16: qty 50

## 2019-08-16 MED ORDER — DEXMEDETOMIDINE HCL IN NACL 80 MCG/20ML IV SOLN
INTRAVENOUS | Status: AC
  Filled 2019-08-16: qty 20

## 2019-08-16 MED ORDER — PROPOFOL 10 MG/ML IV BOLUS
INTRAVENOUS | Status: AC
  Filled 2019-08-16: qty 40

## 2019-08-16 MED ORDER — KETAMINE HCL 50 MG/ML IJ SOLN
INTRAMUSCULAR | Status: AC
  Filled 2019-08-16: qty 10

## 2019-08-16 MED ORDER — FAMOTIDINE 20 MG PO TABS
ORAL_TABLET | ORAL | Status: AC
  Administered 2019-08-16: 20 mg via ORAL
  Filled 2019-08-16: qty 1

## 2019-08-16 MED ORDER — METOCLOPRAMIDE HCL 5 MG/ML IJ SOLN: 5.0000 mg | Freq: Three times a day (TID) | INTRAMUSCULAR | Status: DC | PRN

## 2019-08-16 MED ORDER — LIDOCAINE HCL (PF) 1 % IJ SOLN
INTRAMUSCULAR | Status: AC
  Filled 2019-08-16: qty 60

## 2019-08-16 MED ORDER — ONDANSETRON HCL 4 MG PO TABS: 4.0000 mg | ORAL_TABLET | Freq: Four times a day (QID) | ORAL | Status: DC | PRN

## 2019-08-16 MED ORDER — CHLORHEXIDINE GLUCONATE 4 % EX LIQD: 60.0000 mL | Freq: Once | CUTANEOUS | Status: DC

## 2019-08-16 MED ORDER — BUPIVACAINE HCL (PF) 0.5 % IJ SOLN
INTRAMUSCULAR | Status: AC
  Filled 2019-08-16: qty 30

## 2019-08-16 SURGICAL SUPPLY — 20 items
ADH SKN CLS APL DERMABOND .7 (GAUZE/BANDAGES/DRESSINGS) ×1
CEMENT KYPHON CX01A KIT/MIXER (Cement) ×3 IMPLANT
COVER WAND RF STERILE (DRAPES) ×3 IMPLANT
DERMABOND ADVANCED (GAUZE/BANDAGES/DRESSINGS) ×2
DERMABOND ADVANCED .7 DNX12 (GAUZE/BANDAGES/DRESSINGS) ×1 IMPLANT
DEVICE BIOPSY BONE KYPHX (INSTRUMENTS) ×3 IMPLANT
DRAPE C-ARM XRAY 36X54 (DRAPES) ×3 IMPLANT
DURAPREP 26ML APPLICATOR (WOUND CARE) ×3 IMPLANT
GLOVE SURG SYN 9.0  PF PI (GLOVE) ×2
GLOVE SURG SYN 9.0 PF PI (GLOVE) ×1 IMPLANT
GOWN SRG 2XL LVL 4 RGLN SLV (GOWNS) ×1 IMPLANT
GOWN STRL NON-REIN 2XL LVL4 (GOWNS) ×3
GOWN STRL REUS W/ TWL LRG LVL3 (GOWN DISPOSABLE) ×1 IMPLANT
GOWN STRL REUS W/TWL LRG LVL3 (GOWN DISPOSABLE) ×3
PACK KYPHOPLASTY (MISCELLANEOUS) ×3 IMPLANT
RENTAL RFA  GENERATOR (MISCELLANEOUS)
RENTAL RFA GENERATOR (MISCELLANEOUS) IMPLANT
STRAP SAFETY 5IN WIDE (MISCELLANEOUS) ×3 IMPLANT
TRAY KYPHOPAK 15/3 EXPRESS 1ST (MISCELLANEOUS) ×3 IMPLANT
TRAY KYPHOPAK 20/3 EXPRESS 1ST (MISCELLANEOUS) IMPLANT

## 2019-08-16 NOTE — H&P (Signed)
Reviewed paper H+P, will be scanned into chart. No changes noted.  

## 2019-08-16 NOTE — Transfer of Care (Signed)
Immediate Anesthesia Transfer of Care Note  Patient: April Rogers  Procedure(s) Performed: L4 KYPHOPLASTY (N/A )  Patient Location: PACU  Anesthesia Type:General  Level of Consciousness: drowsy  Airway & Oxygen Therapy: Patient Spontanous Breathing and Patient connected to face mask oxygen  Post-op Assessment: Report given to RN and Post -op Vital signs reviewed and stable  Post vital signs: Reviewed and stable  Last Vitals:  Vitals Value Taken Time  BP 123/95 08/16/19 1500  Temp    Pulse 66 08/16/19 1503  Resp 12 08/16/19 1503  SpO2 100 % 08/16/19 1503  Vitals shown include unvalidated device data.  Last Pain:  Vitals:   08/16/19 1144  TempSrc: Oral  PainSc: 0-No pain         Complications: No apparent anesthesia complications

## 2019-08-16 NOTE — Discharge Instructions (Addendum)
Take it easy today and tomorrow.  Remove Band-Aids on Sunday then okay to shower.  Slowly resume more normal activities but try to avoid bending over or lifting more than 5 pounds for the next 2 weeks.  Pain medicine as previously prescribed.  Call office if you are having problems.  AMBULATORY SURGERY  DISCHARGE INSTRUCTIONS   1) The drugs that you were given will stay in your system until tomorrow so for the next 24 hours you should not:  A) Drive an automobile B) Make any legal decisions C) Drink any alcoholic beverage   2) You may resume regular meals tomorrow.  Today it is better to start with liquids and gradually work up to solid foods.  You may eat anything you prefer, but it is better to start with liquids, then soup and crackers, and gradually work up to solid foods.   3) Please notify your doctor immediately if you have any unusual bleeding, trouble breathing, redness and pain at the surgery site, drainage, fever, or pain not relieved by medication.    4) Additional Instructions:        Please contact your physician with any problems or Same Day Surgery at [phone], Monday through Friday 6 am to 4 pm, or [place] at [facility] number at [phone].

## 2019-08-16 NOTE — Anesthesia Preprocedure Evaluation (Addendum)
Anesthesia Evaluation  Patient identified by MRN, date of birth, ID band Patient awake    Reviewed: Allergy & Precautions, H&P , NPO status , Patient's Chart, lab work & pertinent test results  History of Anesthesia Complications (+) history of anesthetic complications (confusion after heart surgery)  Airway Mallampati: II  TM Distance: <3 FB Neck ROM: full    Dental  (+) Upper Dentures, Lower Dentures   Pulmonary neg COPD, former smoker,           Cardiovascular hypertension,      Neuro/Psych PSYCHIATRIC DISORDERS Anxiety Depression negative neurological ROS     GI/Hepatic Neg liver ROS, GERD  ,  Endo/Other  negative endocrine ROS  Renal/GU negative Renal ROS  negative genitourinary   Musculoskeletal   Abdominal   Peds  Hematology negative hematology ROS (+)   Anesthesia Other Findings Past Medical History: No date: Anxiety No date: Arthritis No date: Cancer ([place])     Comment:  skin cancer No date: Complication of anesthesia     Comment:  general anesthesia hard on memory after pt goes home No date: Depression No date: Fracture of one rib No date: GERD (gastroesophageal reflux disease)     Comment:  was on nexium but no longer needs to take this               medication No date: Hypertension No date: Macular degeneration of both eyes No date: Osteoporosis  Past Surgical History: No date: ARM SKIN LESION BIOPSY / EXCISION; Right No date: CHOLECYSTECTOMY 08/03/2015: COLONOSCOPY WITH PROPOFOL; N/A     Comment:  Procedure: COLONOSCOPY WITH PROPOFOL;  Surgeon: Hulen Luster, MD;  Location: [facility] ENDOSCOPY;  Service:               Gastroenterology;  Laterality: N/A; 11/26/2015: INTRAMEDULLARY (IM) NAIL INTERTROCHANTERIC; Right     Comment:  Procedure: INTRAMEDULLARY (IM) NAIL INTERTROCHANTRIC;                Surgeon: Earnestine Leys, MD;  Location: [facility] ORS;                Service: Orthopedics;   Laterality: Right; 2019: open heart surgery     Comment:  valve replacement, aortic arch  aneurysm repair No date: TONSILLECTOMY No date: TUBAL LIGATION No date: WRIST RECONSTRUCTION; Right  BMI    Body Mass Index: 22.85 kg/m      Reproductive/Obstetrics negative OB ROS                            Anesthesia Physical Anesthesia Plan  ASA: II  Anesthesia Plan: General   Post-op Pain Management:    Induction:   PONV Risk Score and Plan: Propofol infusion and TIVA  Airway Management Planned: Natural Airway and Nasal Cannula  Additional Equipment:   Intra-op Plan:   Post-operative Plan:   Informed Consent: I have reviewed the patients History and Physical, chart, labs and discussed the procedure including the risks, benefits and alternatives for the proposed anesthesia with the patient or authorized representative who has indicated his/her understanding and acceptance.     Dental Advisory Given  Plan Discussed with: Anesthesiologist  Anesthesia Plan Comments:        Anesthesia Quick Evaluation

## 2019-08-16 NOTE — Op Note (Signed)
Date August 16, 2019  time 2:54 PM   PATIENT:  April Rogers   PRE-OPERATIVE DIAGNOSIS:  closed wedge compression fracture of L4    POST-OPERATIVE DIAGNOSIS:  closed wedge compression fracture of L4   PROCEDURE:  Procedure(s): KYPHOPLASTY L4  SURGEON: Laurene Footman, MD   ASSISTANTS: None   ANESTHESIA:   local and MAC   EBL:  No intake/output data recorded.   BLOOD ADMINISTERED:none   DRAINS: none    LOCAL MEDICATIONS USED:  MARCAINE    and XYLOCAINE    SPECIMEN:   L4 vertebral body biopsy   DISPOSITION OF SPECIMEN:  Pathology   COUNTS:  YES   TOURNIQUET:  * No tourniquets in log *   IMPLANTS: Bone cement   DICTATION: .Dragon Dictation  patient was brought to the operating room and after adequate anesthesia was obtained the patient was placed prone.  C arm was brought in in good visualization of the affected level obtained on both AP and lateral projections.  After patient identification and timeout procedures were completed, local anesthetic was infiltrated with 10 cc 1% Xylocaine infiltrated subcutaneously.  This is done the area on the each side of the planned approach.  The back was then prepped and draped you sterile manner and repeat timeout procedure carried out.  A spinal needle was brought down to the pedicle on the each side of  L4 and a 50-50 mix of 1% Xylocaine half percent Sensorcaine with epinephrine total of 20 cc injected.  After allowing this to set a small incision was made and the trocar was advanced into the vertebral body in an extrapedicular fashion on each side.  Biopsy was obtained Drilling was carried out balloon inserted with inflation to  3-1/2 cc to each side.  When the cement was appropriate consistency approximately 8-1/2 cc were injected into the vertebral body without extravasation, good fill superior to inferior endplates and from right to left sides along the inferior endplate.  After the cement had set the trochar was removed and permanent C-arm  views obtained.  The wound was closed with Dermabond followed by Band-Aid   PLAN OF CARE: Discharge to home after PACU   PATIENT DISPOSITION:  PACU - hemodynamically stable.

## 2019-08-18 NOTE — Anesthesia Postprocedure Evaluation (Signed)
Anesthesia Post Note  Patient: April Rogers  Procedure(s) Performed: L4 KYPHOPLASTY (N/A )  Patient location during evaluation: PACU Anesthesia Type: General Level of consciousness: awake and alert Pain management: pain level controlled Vital Signs Assessment: post-procedure vital signs reviewed and stable Respiratory status: spontaneous breathing, nonlabored ventilation and respiratory function stable Cardiovascular status: blood pressure returned to baseline and stable Postop Assessment: no apparent nausea or vomiting Anesthetic complications: no     Last Vitals:  Vitals:   08/16/19 1545 08/16/19 1558  BP: (!) 153/51 (!) 140/55  Pulse: (!) 58 (!) 59  Resp: 15 14  Temp:  (!) 36.2 C  SpO2: 100% 94%    Last Pain:  Vitals:   08/16/19 1558  TempSrc: Temporal  PainSc: 0-No pain                 Brett Canales Ramsdell

## 2019-08-20 LAB — SURGICAL PATHOLOGY

## 2019-08-23 ENCOUNTER — Other Ambulatory Visit (HOSPITAL_COMMUNITY): Payer: Self-pay | Admitting: Orthopedic Surgery

## 2019-08-23 ENCOUNTER — Other Ambulatory Visit: Payer: Self-pay | Admitting: Orthopedic Surgery

## 2019-08-23 DIAGNOSIS — S32040A Wedge compression fracture of fourth lumbar vertebra, initial encounter for closed fracture: Secondary | ICD-10-CM | POA: Diagnosis not present

## 2019-08-23 DIAGNOSIS — S32020A Wedge compression fracture of second lumbar vertebra, initial encounter for closed fracture: Secondary | ICD-10-CM

## 2019-08-23 DIAGNOSIS — Z9889 Other specified postprocedural states: Secondary | ICD-10-CM | POA: Diagnosis not present

## 2019-08-25 ENCOUNTER — Ambulatory Visit
Admission: RE | Admit: 2019-08-25 | Discharge: 2019-08-25 | Disposition: A | Discharge status: Home / Self Care | Payer: Medicare HMO | Source: Ambulatory Visit | Attending: Orthopedic Surgery | Admitting: Orthopedic Surgery

## 2019-08-25 DIAGNOSIS — S32020A Wedge compression fracture of second lumbar vertebra, initial encounter for closed fracture: Secondary | ICD-10-CM | POA: Insufficient documentation

## 2019-08-26 ENCOUNTER — Other Ambulatory Visit
Admission: RE | Admit: 2019-08-26 | Discharge: 2019-08-26 | Disposition: A | Discharge status: Home / Self Care | Payer: Medicare HMO | Source: Ambulatory Visit | Attending: Orthopedic Surgery | Admitting: Orthopedic Surgery

## 2019-08-26 ENCOUNTER — Other Ambulatory Visit: Payer: Self-pay

## 2019-08-26 DIAGNOSIS — S32020A Wedge compression fracture of second lumbar vertebra, initial encounter for closed fracture: Secondary | ICD-10-CM | POA: Diagnosis not present

## 2019-08-26 DIAGNOSIS — Z20822 Contact with and (suspected) exposure to covid-19: Secondary | ICD-10-CM | POA: Diagnosis not present

## 2019-08-26 DIAGNOSIS — Z01812 Encounter for preprocedural laboratory examination: Secondary | ICD-10-CM | POA: Diagnosis not present

## 2019-08-26 DIAGNOSIS — Z9889 Other specified postprocedural states: Secondary | ICD-10-CM | POA: Diagnosis not present

## 2019-08-27 ENCOUNTER — Encounter
Admission: RE | Admit: 2019-08-27 | Discharge: 2019-08-27 | Disposition: A | Discharge status: Home / Self Care | Payer: Medicare HMO | Source: Ambulatory Visit | Attending: Orthopedic Surgery | Admitting: Orthopedic Surgery

## 2019-08-27 ENCOUNTER — Other Ambulatory Visit: Payer: Self-pay | Admitting: Orthopedic Surgery

## 2019-08-27 LAB — SARS CORONAVIRUS 2 (TAT 6-24 HRS): SARS Coronavirus 2: NEGATIVE

## 2019-08-27 NOTE — Patient Instructions (Signed)
Your procedure is scheduled on: Thurs 2/18 Report to Day Surgery. [facility] To find out your arrival time please call [phone] between [place] on Wed. 2/17.  Remember: Instructions that are not followed completely may result in serious medical risk,  up to and including death, or upon the discretion of your surgeon and anesthesiologist your  surgery may need to be rescheduled.     _X__ 1. Do not eat food after midnight the night before your procedure.                 No gum chewing or hard candies. You may drink clear liquids up to 2 hours                 before you are scheduled to arrive for your surgery- DO not drink clear                 liquids within 2 hours of the start of your surgery.                 Clear Liquids include:  water, apple juice without pulp, clear Gatorade, G2 or                  Gatorade Zero (avoid Red/Purple/Blue), Black Coffee or Tea (Do not add                 anything to coffee or tea). _____2.   Complete the carbohydrate drink provided to you, 2 hours before arrival.  __X__2.  On the morning of surgery brush your teeth with toothpaste and water, you                may rinse your mouth with mouthwash if you wish.  Do not swallow any toothpaste of mouthwash.     _X__ 3.  No Alcohol for 24 hours before or after surgery.   _X__ 4.  Do Not Smoke or use e-cigarettes For 24 Hours Prior to Your Surgery.                 Do not use any chewable tobacco products for at least 6 hours prior to                 surgery.  ____  5.  Bring all medications with you on the day of surgery if instructed.   __x__  6.  Notify your doctor if there is any change in your medical condition      (cold, fever, infections).     Do not wear jewelry, make-up, hairpins, clips or nail polish. Do not wear lotions, powders, or perfumes. You may wear deodorant. Do not shave 48 hours prior to surgery. Men may shave face and neck. Do not bring valuables to the hospital.    [facility] is not responsible for any belongings or valuables.  Contacts, dentures or bridgework may not be worn into surgery. Leave your suitcase in the car. After surgery it may be brought to your room. For patients admitted to the hospital, discharge time is determined by your treatment team.   Patients discharged the day of surgery will not be allowed to drive home.   Make arrangements for someone to be with you for the first 24 hours of your Same Day Discharge.    Please read over the following fact sheets that you were given:     _x___ Take these medicines the morning of surgery with A SIP OF WATER:  1. gabapentin (NEURONTIN) 300 MG capsule  2. methocarbamol (ROBAXIN) 500 MG tablet  3. metoprolol tartrate (LOPRESSOR) 25 MG tablet  4.oxyCODONE (OXY IR/ROXICODONE) 5 MG immediate release tablet  5.May take regular bowel regime  6.venlafaxine XR (EFFEXOR-XR) 150 MG 24 hr capsule  ____ Fleet Enema (as directed)   __x__ Use CHG Soap (or wipes) as directed  ____ Use Benzoyl Peroxide Gel as instructed  ____ Use inhalers on the day of surgery  ____ Stop metformin 2 days prior to surgery    ____ Take 1/2 of usual insulin dose the night before surgery. No insulin the morning          of surgery.   __x__ Stop aspirin today  __x__ Stop Anti-inflammatories    ____ Stop supplements until after surgery.    ____ Bring C-Pap to the hospital.

## 2019-08-28 MED ORDER — CLINDAMYCIN PHOSPHATE 900 MG/50ML IV SOLN
900.0000 mg | INTRAVENOUS | Status: AC
  Administered 2019-08-29: 900 mg via INTRAVENOUS

## 2019-08-29 ENCOUNTER — Ambulatory Visit: Payer: Medicare HMO | Admitting: Anesthesiology

## 2019-08-29 ENCOUNTER — Other Ambulatory Visit: Payer: Self-pay

## 2019-08-29 ENCOUNTER — Encounter
Admission: RE | Disposition: A | Discharge status: Home / Self Care | Payer: Self-pay | Source: Home / Self Care | Attending: Orthopedic Surgery

## 2019-08-29 ENCOUNTER — Ambulatory Visit
Admission: RE | Admit: 2019-08-29 | Discharge: 2019-08-29 | Disposition: A | Discharge status: Home / Self Care | Payer: Medicare HMO | Attending: Orthopedic Surgery | Admitting: Orthopedic Surgery

## 2019-08-29 ENCOUNTER — Ambulatory Visit: Payer: Medicare HMO

## 2019-08-29 ENCOUNTER — Encounter: Payer: Self-pay | Admitting: Orthopedic Surgery

## 2019-08-29 DIAGNOSIS — I700 Atherosclerosis of aorta: Secondary | ICD-10-CM | POA: Insufficient documentation

## 2019-08-29 DIAGNOSIS — F329 Major depressive disorder, single episode, unspecified: Secondary | ICD-10-CM | POA: Diagnosis not present

## 2019-08-29 DIAGNOSIS — Z87891 Personal history of nicotine dependence: Secondary | ICD-10-CM | POA: Insufficient documentation

## 2019-08-29 DIAGNOSIS — K219 Gastro-esophageal reflux disease without esophagitis: Secondary | ICD-10-CM | POA: Diagnosis not present

## 2019-08-29 DIAGNOSIS — I712 Thoracic aortic aneurysm, without rupture: Secondary | ICD-10-CM | POA: Insufficient documentation

## 2019-08-29 DIAGNOSIS — Z952 Presence of prosthetic heart valve: Secondary | ICD-10-CM | POA: Diagnosis not present

## 2019-08-29 DIAGNOSIS — M810 Age-related osteoporosis without current pathological fracture: Secondary | ICD-10-CM | POA: Diagnosis not present

## 2019-08-29 DIAGNOSIS — Z79899 Other long term (current) drug therapy: Secondary | ICD-10-CM | POA: Insufficient documentation

## 2019-08-29 DIAGNOSIS — Z85828 Personal history of other malignant neoplasm of skin: Secondary | ICD-10-CM | POA: Insufficient documentation

## 2019-08-29 DIAGNOSIS — S32040A Wedge compression fracture of fourth lumbar vertebra, initial encounter for closed fracture: Secondary | ICD-10-CM | POA: Insufficient documentation

## 2019-08-29 DIAGNOSIS — Z7982 Long term (current) use of aspirin: Secondary | ICD-10-CM | POA: Insufficient documentation

## 2019-08-29 DIAGNOSIS — Z981 Arthrodesis status: Secondary | ICD-10-CM | POA: Diagnosis not present

## 2019-08-29 DIAGNOSIS — I714 Abdominal aortic aneurysm, without rupture: Secondary | ICD-10-CM | POA: Insufficient documentation

## 2019-08-29 DIAGNOSIS — Z419 Encounter for procedure for purposes other than remedying health state, unspecified: Secondary | ICD-10-CM

## 2019-08-29 DIAGNOSIS — X58XXXA Exposure to other specified factors, initial encounter: Secondary | ICD-10-CM | POA: Insufficient documentation

## 2019-08-29 DIAGNOSIS — Z9889 Other specified postprocedural states: Secondary | ICD-10-CM | POA: Diagnosis not present

## 2019-08-29 DIAGNOSIS — I10 Essential (primary) hypertension: Secondary | ICD-10-CM | POA: Diagnosis not present

## 2019-08-29 DIAGNOSIS — Z951 Presence of aortocoronary bypass graft: Secondary | ICD-10-CM | POA: Insufficient documentation

## 2019-08-29 DIAGNOSIS — S32020A Wedge compression fracture of second lumbar vertebra, initial encounter for closed fracture: Secondary | ICD-10-CM | POA: Diagnosis not present

## 2019-08-29 DIAGNOSIS — J449 Chronic obstructive pulmonary disease, unspecified: Secondary | ICD-10-CM | POA: Diagnosis not present

## 2019-08-29 DIAGNOSIS — F419 Anxiety disorder, unspecified: Secondary | ICD-10-CM | POA: Insufficient documentation

## 2019-08-29 DIAGNOSIS — M4856XA Collapsed vertebra, not elsewhere classified, lumbar region, initial encounter for fracture: Secondary | ICD-10-CM | POA: Diagnosis not present

## 2019-08-29 HISTORY — PX: KYPHOPLASTY: SHX5884

## 2019-08-29 SURGERY — KYPHOPLASTY
Anesthesia: General | Site: Back

## 2019-08-29 MED ORDER — FAMOTIDINE 20 MG PO TABS
ORAL_TABLET | ORAL | Status: AC
  Administered 2019-08-29: 20 mg via ORAL
  Filled 2019-08-29: qty 1

## 2019-08-29 MED ORDER — MIDAZOLAM HCL 2 MG/2ML IJ SOLN
INTRAMUSCULAR | Status: DC | PRN
  Administered 2019-08-29: .5 mg via INTRAVENOUS
  Administered 2019-08-29: 1 mg via INTRAVENOUS
  Administered 2019-08-29: .5 mg via INTRAVENOUS

## 2019-08-29 MED ORDER — SODIUM CHLORIDE 0.9 % IV SOLN: INTRAVENOUS | Status: DC

## 2019-08-29 MED ORDER — PROPOFOL 10 MG/ML IV BOLUS
INTRAVENOUS | Status: DC | PRN
  Administered 2019-08-29: 30 mg via INTRAVENOUS

## 2019-08-29 MED ORDER — CHLORHEXIDINE GLUCONATE 4 % EX LIQD
60.0000 mL | Freq: Once | CUTANEOUS | Status: AC
  Administered 2019-08-29: 4 via TOPICAL

## 2019-08-29 MED ORDER — FENTANYL CITRATE (PF) 100 MCG/2ML IJ SOLN
INTRAMUSCULAR | Status: DC | PRN
  Administered 2019-08-29: 50 ug via INTRAVENOUS
  Administered 2019-08-29 (×2): 25 ug via INTRAVENOUS

## 2019-08-29 MED ORDER — CLINDAMYCIN PHOSPHATE 900 MG/50ML IV SOLN
INTRAVENOUS | Status: AC
  Filled 2019-08-29: qty 50

## 2019-08-29 MED ORDER — ONDANSETRON HCL 4 MG PO TABS: 4.0000 mg | ORAL_TABLET | Freq: Four times a day (QID) | ORAL | Status: DC | PRN

## 2019-08-29 MED ORDER — OXYCODONE HCL 5 MG PO TABS
5.0000 mg | ORAL_TABLET | Freq: Once | ORAL | Status: AC
  Administered 2019-08-29: 5 mg via ORAL
  Filled 2019-08-29: qty 1

## 2019-08-29 MED ORDER — OXYCODONE HCL 5 MG PO TABS
ORAL_TABLET | ORAL | Status: AC
  Filled 2019-08-29: qty 1

## 2019-08-29 MED ORDER — PROPOFOL 500 MG/50ML IV EMUL
INTRAVENOUS | Status: DC | PRN
  Administered 2019-08-29: 35 ug/kg/min via INTRAVENOUS

## 2019-08-29 MED ORDER — FENTANYL CITRATE (PF) 100 MCG/2ML IJ SOLN
INTRAMUSCULAR | Status: AC
  Filled 2019-08-29: qty 2

## 2019-08-29 MED ORDER — IOHEXOL 180 MG/ML  SOLN
INTRAMUSCULAR | Status: DC | PRN
  Administered 2019-08-29: 20 mL

## 2019-08-29 MED ORDER — ONDANSETRON HCL 4 MG/2ML IJ SOLN: 4.0000 mg | Freq: Once | INTRAMUSCULAR | Status: DC | PRN

## 2019-08-29 MED ORDER — LACTATED RINGERS IV SOLN: INTRAVENOUS | Status: DC

## 2019-08-29 MED ORDER — ONDANSETRON HCL 4 MG/2ML IJ SOLN: 4.0000 mg | Freq: Four times a day (QID) | INTRAMUSCULAR | Status: DC | PRN

## 2019-08-29 MED ORDER — MIDAZOLAM HCL 2 MG/2ML IJ SOLN
INTRAMUSCULAR | Status: AC
  Filled 2019-08-29: qty 2

## 2019-08-29 MED ORDER — METOCLOPRAMIDE HCL 10 MG PO TABS: 5.0000 mg | ORAL_TABLET | Freq: Three times a day (TID) | ORAL | Status: DC | PRN

## 2019-08-29 MED ORDER — ONDANSETRON HCL 4 MG/2ML IJ SOLN
INTRAMUSCULAR | Status: DC | PRN
  Administered 2019-08-29: 4 mg via INTRAVENOUS

## 2019-08-29 MED ORDER — LIDOCAINE HCL 1 % IJ SOLN
INTRAMUSCULAR | Status: DC | PRN
  Administered 2019-08-29 (×2): 10 mL

## 2019-08-29 MED ORDER — BUPIVACAINE-EPINEPHRINE (PF) 0.5% -1:200000 IJ SOLN
INTRAMUSCULAR | Status: DC | PRN
  Administered 2019-08-29: 10 mL

## 2019-08-29 MED ORDER — FENTANYL CITRATE (PF) 100 MCG/2ML IJ SOLN: 25.0000 ug | INTRAMUSCULAR | Status: DC | PRN

## 2019-08-29 MED ORDER — FAMOTIDINE 20 MG PO TABS: 20.0000 mg | ORAL_TABLET | Freq: Once | ORAL | Status: AC

## 2019-08-29 MED ORDER — METOCLOPRAMIDE HCL 5 MG/ML IJ SOLN: 5.0000 mg | Freq: Three times a day (TID) | INTRAMUSCULAR | Status: DC | PRN

## 2019-08-29 SURGICAL SUPPLY — 21 items
ADH SKN CLS APL DERMABOND .7 (GAUZE/BANDAGES/DRESSINGS) ×1
CEMENT KYPHON CX01A KIT/MIXER (Cement) ×3 IMPLANT
COVER WAND RF STERILE (DRAPES) ×3 IMPLANT
DERMABOND ADVANCED (GAUZE/BANDAGES/DRESSINGS) ×2
DERMABOND ADVANCED .7 DNX12 (GAUZE/BANDAGES/DRESSINGS) ×1 IMPLANT
DEVICE BIOPSY BONE KYPHX (INSTRUMENTS) ×3 IMPLANT
DRAPE C-ARM XRAY 36X54 (DRAPES) ×3 IMPLANT
DURAPREP 26ML APPLICATOR (WOUND CARE) ×3 IMPLANT
FEE RENTAL RFA GENERATOR (MISCELLANEOUS) IMPLANT
GLOVE SURG SYN 9.0  PF PI (GLOVE) ×2
GLOVE SURG SYN 9.0 PF PI (GLOVE) ×1 IMPLANT
GOWN SRG 2XL LVL 4 RGLN SLV (GOWNS) ×1 IMPLANT
GOWN STRL NON-REIN 2XL LVL4 (GOWNS) ×3
GOWN STRL REUS W/ TWL LRG LVL3 (GOWN DISPOSABLE) ×1 IMPLANT
GOWN STRL REUS W/TWL LRG LVL3 (GOWN DISPOSABLE) ×3
PACK KYPHOPLASTY (MISCELLANEOUS) ×3 IMPLANT
RENTAL RFA  GENERATOR (MISCELLANEOUS)
RENTAL RFA GENERATOR (MISCELLANEOUS) IMPLANT
STRAP SAFETY 5IN WIDE (MISCELLANEOUS) ×3 IMPLANT
TRAY KYPHOPAK 15/3 EXPRESS 1ST (MISCELLANEOUS) ×3 IMPLANT
TRAY KYPHOPAK 20/3 EXPRESS 1ST (MISCELLANEOUS) ×1 IMPLANT

## 2019-08-29 NOTE — H&P (Signed)
Reviewed paper H+P, will be scanned into chart. No changes noted.  

## 2019-08-29 NOTE — Op Note (Signed)
Date August 29, 2019  time 3:47 PM   PATIENT:  April Rogers   PRE-OPERATIVE DIAGNOSIS:  closed wedge compression fracture of L2   POST-OPERATIVE DIAGNOSIS:  closed wedge compression fracture of L2   PROCEDURE:  Procedure(s): KYPHOPLASTY L2  SURGEON: Laurene Footman, MD   ASSISTANTS: None   ANESTHESIA:   local and MAC   EBL:  No intake/output data recorded.   BLOOD ADMINISTERED:none   DRAINS: none    LOCAL MEDICATIONS USED:  MARCAINE    and XYLOCAINE    SPECIMEN:   None   DISPOSITION OF SPECIMEN:  Not applicable   COUNTS:  YES   TOURNIQUET:  * No tourniquets in log *   IMPLANTS: Bone cement   DICTATION: .Dragon Dictation  patient was brought to the operating room and after adequate anesthesia was obtained the patient was placed prone.  C arm was brought in in good visualization of the affected level obtained on both AP and lateral projections.  After patient identification and timeout procedures were completed, local anesthetic was infiltrated with 10 cc 1% Xylocaine infiltrated subcutaneously.  This is done the area on the each side of the planned approach.  The back was then prepped and draped in the usual sterile manner and repeat timeout procedure carried out.  A spinal needle was brought down to the pedicle on the right side of  L2 and a 50-50 mix of 1% Xylocaine half percent Sensorcaine with epinephrine total of 20 cc injected.  After allowing this to set a small incision was made and the trocar was advanced into the vertebral body in an extrapedicular fashion.  Biopsy was not obtained although attempted Drilling was carried out balloon inserted with inflation to  for cc.  When the cement was appropriate consistency 7 cc were injected into the vertebral body without extravasation, good fill superior to inferior endplates and from right to left sides along the inferior endplate.  After the cement had set the trochar was removed and permanent C-arm views obtained.  The wound  was closed with Dermabond followed by Band-Aid   PLAN OF CARE: Discharge to home after PACU   PATIENT DISPOSITION:  PACU - hemodynamically stable.

## 2019-08-29 NOTE — Discharge Instructions (Addendum)
Balloon Kyphoplasty, Care After This sheet gives you information about how to care for yourself after your procedure. Your health care provider may also give you more specific instructions. If you have problems or questions, contact your health care provider. What can I expect after the procedure? After your procedure, it is common to have back pain. Follow these instructions at home: Medicines  Take over-the-counter and prescription medicines only as told by your health care provider.  Ask your health care provider if the medicine prescribed to you: ? Requires you to avoid driving or using heavy machinery. ? Can cause constipation. You may need to take steps to prevent or treat constipation, such as:  Drink enough fluid to keep your urine pale yellow.  Take over-the-counter or prescription medicines.  Eat foods that are high in fiber, such as beans, whole grains, and fresh fruits and vegetables.  Limit foods that are high in fat and processed sugars, such as fried or sweet foods. Puncture site care   Follow instructions from your health care provider about how to take care of your puncture site. Make sure you: ? Wash your hands with soap and water before and after you change your bandage (dressing). If soap and water are not available, use hand sanitizer. ? Change your dressing as told by your health care provider. ? Leave skin glue or adhesive strips in place. These skin closures may need to be in place for 2 weeks or longer. If adhesive strip edges start to loosen and curl up, you may trim the loose edges. Do not remove adhesive strips completely unless your health care provider tells you to do that.  Check your puncture site every day for signs of infection. Watch for: ? Redness, swelling, or pain. ? Fluid or blood. ? Warmth. ? Pus or a bad smell.  Keep your dressing dry until your health care provider says that it can be removed. Managing pain, stiffness, and swelling   If  directed, put ice on the painful area. ? Put ice in a plastic bag. ? Place a towel between your skin and the bag. ? Leave the ice on for 20 minutes, 2-3 times a day. Activity  Rest your back and avoid intense physical activity for as long as told by your health care provider.  Avoid bending, lifting, or twisting your back for as long as told by your health care provider.  Return to your normal activities as told by your health care provider. Ask your health care provider what activities are safe for you.  Do not lift anything that is heavier than 5 lb (2.2 kg). You may need to avoid heavy lifting for several weeks. General instructions  Do not use any products that contain nicotine or tobacco, such as cigarettes, e-cigarettes, and chewing tobacco. These can delay bone healing. If you need help quitting, ask your health care provider.  Do not drive for 24 hours if you were given a sedative during your procedure.  Keep all follow-up visits as told by your health care provider. This is important. Contact a health care provider if:  You have a fever or chills.  You have redness, swelling, or pain at the site of your puncture.  You have fluid, blood, or pus coming from the puncture site.  You have pain that gets worse or does not get better with medicine.  You develop numbness or weakness in any part of your body. Get help right away if:  You have chest pain.  You have difficulty breathing.  You have weakness, numbness, or tingling in your legs.  You cannot control your bladder or bowel movements.  You suddenly become weak or numb on one side of your body.  You become very confused.  You have trouble speaking or understanding, or both. Summary  Follow instructions from your health care provider about how to take care of your puncture site.  Take over-the-counter and prescription medicines only as told by your health care provider.  Rest your back and avoid intense  physical activity for as long as told by your health care provider.  Contact a health care provider if you have pain that gets worse or does not get better with medicine.  Keep all follow-up visits as told by your health care provider. This is important. This information is not intended to replace advice given to you by your health care provider. Make sure you discuss any questions you have with your health care provider. Document Revised: 06/04/2018 Document Reviewed: 06/04/2018 Elsevier Patient Education  2020 [place]   1) The drugs that you were given will stay in your system until tomorrow so for the next 24 hours you should not:  A) Drive an automobile B) Make any legal decisions C) Drink any alcoholic beverage   2) You may resume regular meals tomorrow.  Today it is better to start with liquids and gradually work up to solid foods.  You may eat anything you prefer, but it is better to start with liquids, then soup and crackers, and gradually work up to solid foods.   3) Please notify your doctor immediately if you have any unusual bleeding, trouble breathing, redness and pain at the surgery site, drainage, fever, or pain not relieved by medication.    4) Additional Instructions:        Please contact your physician with any problems or Same Day Surgery at [phone], Monday through Friday 6 am to 4 pm, or [place] at [facility] number at [phone].

## 2019-08-29 NOTE — Anesthesia Preprocedure Evaluation (Addendum)
Anesthesia Evaluation  Patient identified by MRN, date of birth, ID band Patient awake    Reviewed: Allergy & Precautions, NPO status , Patient's Chart, lab work & pertinent test results, reviewed documented beta blocker date and time   History of Anesthesia Complications Negative for: history of anesthetic complications  Airway Mallampati: II       Dental  (+) Edentulous Upper, Partial Lower   Pulmonary neg sleep apnea, neg COPD, Not current smoker, former smoker,           Cardiovascular hypertension, Pt. on medications and Pt. on home beta blockers (-) Past MI and (-) CHF (-) dysrhythmias + Valvular Problems/Murmurs (s/p aortic valve replacement) AS      Neuro/Psych Seizures - (with Aortic valve replacement, no tx),  Anxiety Depression    GI/Hepatic Neg liver ROS, GERD  Medicated and Controlled,  Endo/Other  neg diabetes  Renal/GU negative Renal ROS     Musculoskeletal   Abdominal   Peds  Hematology   Anesthesia Other Findings   Reproductive/Obstetrics                            Anesthesia Physical Anesthesia Plan  ASA: II  Anesthesia Plan: General   Post-op Pain Management:    Induction:   PONV Risk Score and Plan: 3 and Ondansetron, Propofol infusion and TIVA  Airway Management Planned: Nasal Cannula  Additional Equipment:   Intra-op Plan:   Post-operative Plan:   Informed Consent: I have reviewed the patients History and Physical, chart, labs and discussed the procedure including the risks, benefits and alternatives for the proposed anesthesia with the patient or authorized representative who has indicated his/her understanding and acceptance.       Plan Discussed with:   Anesthesia Plan Comments:         Anesthesia Quick Evaluation

## 2019-08-30 NOTE — Transfer of Care (Signed)
Immediate Anesthesia Transfer of Care Note  Patient: April Rogers  Procedure(s) Performed: L2 KYPHOPLASTY (N/A Back)  Patient Location: PACU  Anesthesia Type:General  Level of Consciousness: awake, alert  and oriented  Airway & Oxygen Therapy: Patient Spontanous Breathing  Post-op Assessment: Report given to RN  Post vital signs: Reviewed and stable  Last Vitals:  Vitals Value Taken Time  BP 120/94 08/29/19 1715  Temp 36.8 C 08/29/19 1715  Pulse 67 08/29/19 1715  Resp 16 08/29/19 1715  SpO2 94 % 08/29/19 1715    Last Pain:  Vitals:   08/29/19 1715  TempSrc: Temporal  PainSc: 6       Patients Stated Pain Goal: 0 ([id number])  Complications: No apparent anesthesia complications

## 2019-08-30 NOTE — Anesthesia Postprocedure Evaluation (Signed)
Anesthesia Post Note  Patient: April Rogers  Procedure(s) Performed: L2 KYPHOPLASTY (N/A Back)  Patient location during evaluation: PACU Anesthesia Type: General Level of consciousness: awake and alert and oriented Pain management: pain level controlled Vital Signs Assessment: post-procedure vital signs reviewed and stable Respiratory status: spontaneous breathing Cardiovascular status: blood pressure returned to baseline Anesthetic complications: no     Last Vitals:  Vitals:   08/29/19 1653 08/29/19 1715  BP: (!) 145/65 (!) 120/94  Pulse: 70 67  Resp: 13 16  Temp: 36.7 C 36.8 C  SpO2: 94% 94%    Last Pain:  Vitals:   08/29/19 1715  TempSrc: Temporal  PainSc: 6                  Kutter Schnepf

## 2019-09-07 DIAGNOSIS — I110 Hypertensive heart disease with heart failure: Secondary | ICD-10-CM | POA: Diagnosis not present

## 2019-09-07 DIAGNOSIS — F419 Anxiety disorder, unspecified: Secondary | ICD-10-CM | POA: Diagnosis not present

## 2019-09-07 DIAGNOSIS — S32040D Wedge compression fracture of fourth lumbar vertebra, subsequent encounter for fracture with routine healing: Secondary | ICD-10-CM | POA: Diagnosis not present

## 2019-09-07 DIAGNOSIS — J449 Chronic obstructive pulmonary disease, unspecified: Secondary | ICD-10-CM | POA: Diagnosis not present

## 2019-09-07 DIAGNOSIS — M1990 Unspecified osteoarthritis, unspecified site: Secondary | ICD-10-CM | POA: Diagnosis not present

## 2019-09-07 DIAGNOSIS — S32020D Wedge compression fracture of second lumbar vertebra, subsequent encounter for fracture with routine healing: Secondary | ICD-10-CM | POA: Diagnosis not present

## 2019-09-07 DIAGNOSIS — F329 Major depressive disorder, single episode, unspecified: Secondary | ICD-10-CM | POA: Diagnosis not present

## 2019-09-07 DIAGNOSIS — I700 Atherosclerosis of aorta: Secondary | ICD-10-CM | POA: Diagnosis not present

## 2019-09-07 DIAGNOSIS — I5022 Chronic systolic (congestive) heart failure: Secondary | ICD-10-CM | POA: Diagnosis not present

## 2019-09-10 DIAGNOSIS — F419 Anxiety disorder, unspecified: Secondary | ICD-10-CM | POA: Diagnosis not present

## 2019-09-10 DIAGNOSIS — S32040D Wedge compression fracture of fourth lumbar vertebra, subsequent encounter for fracture with routine healing: Secondary | ICD-10-CM | POA: Diagnosis not present

## 2019-09-10 DIAGNOSIS — S32020D Wedge compression fracture of second lumbar vertebra, subsequent encounter for fracture with routine healing: Secondary | ICD-10-CM | POA: Diagnosis not present

## 2019-09-10 DIAGNOSIS — I5022 Chronic systolic (congestive) heart failure: Secondary | ICD-10-CM | POA: Diagnosis not present

## 2019-09-10 DIAGNOSIS — J449 Chronic obstructive pulmonary disease, unspecified: Secondary | ICD-10-CM | POA: Diagnosis not present

## 2019-09-10 DIAGNOSIS — I110 Hypertensive heart disease with heart failure: Secondary | ICD-10-CM | POA: Diagnosis not present

## 2019-09-10 DIAGNOSIS — M1990 Unspecified osteoarthritis, unspecified site: Secondary | ICD-10-CM | POA: Diagnosis not present

## 2019-09-10 DIAGNOSIS — I700 Atherosclerosis of aorta: Secondary | ICD-10-CM | POA: Diagnosis not present

## 2019-09-10 DIAGNOSIS — F329 Major depressive disorder, single episode, unspecified: Secondary | ICD-10-CM | POA: Diagnosis not present

## 2019-09-11 DIAGNOSIS — I110 Hypertensive heart disease with heart failure: Secondary | ICD-10-CM | POA: Diagnosis not present

## 2019-09-11 DIAGNOSIS — S32020D Wedge compression fracture of second lumbar vertebra, subsequent encounter for fracture with routine healing: Secondary | ICD-10-CM | POA: Diagnosis not present

## 2019-09-11 DIAGNOSIS — F419 Anxiety disorder, unspecified: Secondary | ICD-10-CM | POA: Diagnosis not present

## 2019-09-11 DIAGNOSIS — F329 Major depressive disorder, single episode, unspecified: Secondary | ICD-10-CM | POA: Diagnosis not present

## 2019-09-11 DIAGNOSIS — S32040D Wedge compression fracture of fourth lumbar vertebra, subsequent encounter for fracture with routine healing: Secondary | ICD-10-CM | POA: Diagnosis not present

## 2019-09-11 DIAGNOSIS — M1990 Unspecified osteoarthritis, unspecified site: Secondary | ICD-10-CM | POA: Diagnosis not present

## 2019-09-11 DIAGNOSIS — J449 Chronic obstructive pulmonary disease, unspecified: Secondary | ICD-10-CM | POA: Diagnosis not present

## 2019-09-11 DIAGNOSIS — I700 Atherosclerosis of aorta: Secondary | ICD-10-CM | POA: Diagnosis not present

## 2019-09-11 DIAGNOSIS — I5022 Chronic systolic (congestive) heart failure: Secondary | ICD-10-CM | POA: Diagnosis not present

## 2019-09-12 DIAGNOSIS — M5416 Radiculopathy, lumbar region: Secondary | ICD-10-CM | POA: Diagnosis not present

## 2019-09-12 DIAGNOSIS — S32020D Wedge compression fracture of second lumbar vertebra, subsequent encounter for fracture with routine healing: Secondary | ICD-10-CM | POA: Diagnosis not present

## 2019-09-12 DIAGNOSIS — M4726 Other spondylosis with radiculopathy, lumbar region: Secondary | ICD-10-CM | POA: Diagnosis not present

## 2019-09-13 DIAGNOSIS — J449 Chronic obstructive pulmonary disease, unspecified: Secondary | ICD-10-CM | POA: Diagnosis not present

## 2019-09-13 DIAGNOSIS — F329 Major depressive disorder, single episode, unspecified: Secondary | ICD-10-CM | POA: Diagnosis not present

## 2019-09-13 DIAGNOSIS — M1990 Unspecified osteoarthritis, unspecified site: Secondary | ICD-10-CM | POA: Diagnosis not present

## 2019-09-13 DIAGNOSIS — I700 Atherosclerosis of aorta: Secondary | ICD-10-CM | POA: Diagnosis not present

## 2019-09-13 DIAGNOSIS — S32040D Wedge compression fracture of fourth lumbar vertebra, subsequent encounter for fracture with routine healing: Secondary | ICD-10-CM | POA: Diagnosis not present

## 2019-09-13 DIAGNOSIS — I5022 Chronic systolic (congestive) heart failure: Secondary | ICD-10-CM | POA: Diagnosis not present

## 2019-09-13 DIAGNOSIS — F419 Anxiety disorder, unspecified: Secondary | ICD-10-CM | POA: Diagnosis not present

## 2019-09-13 DIAGNOSIS — S32020D Wedge compression fracture of second lumbar vertebra, subsequent encounter for fracture with routine healing: Secondary | ICD-10-CM | POA: Diagnosis not present

## 2019-09-13 DIAGNOSIS — I110 Hypertensive heart disease with heart failure: Secondary | ICD-10-CM | POA: Diagnosis not present

## 2019-09-16 DIAGNOSIS — Z9889 Other specified postprocedural states: Secondary | ICD-10-CM | POA: Diagnosis not present

## 2019-09-16 DIAGNOSIS — R410 Disorientation, unspecified: Secondary | ICD-10-CM | POA: Diagnosis not present

## 2019-09-16 DIAGNOSIS — R404 Transient alteration of awareness: Secondary | ICD-10-CM | POA: Diagnosis not present

## 2019-09-16 DIAGNOSIS — Z87891 Personal history of nicotine dependence: Secondary | ICD-10-CM | POA: Diagnosis not present

## 2019-09-16 DIAGNOSIS — M549 Dorsalgia, unspecified: Secondary | ICD-10-CM | POA: Diagnosis not present

## 2019-09-16 DIAGNOSIS — R531 Weakness: Secondary | ICD-10-CM | POA: Diagnosis not present

## 2019-09-16 DIAGNOSIS — R441 Visual hallucinations: Secondary | ICD-10-CM | POA: Diagnosis not present

## 2019-09-18 DIAGNOSIS — I700 Atherosclerosis of aorta: Secondary | ICD-10-CM | POA: Diagnosis not present

## 2019-09-18 DIAGNOSIS — F329 Major depressive disorder, single episode, unspecified: Secondary | ICD-10-CM | POA: Diagnosis not present

## 2019-09-18 DIAGNOSIS — S32040D Wedge compression fracture of fourth lumbar vertebra, subsequent encounter for fracture with routine healing: Secondary | ICD-10-CM | POA: Diagnosis not present

## 2019-09-18 DIAGNOSIS — S32020D Wedge compression fracture of second lumbar vertebra, subsequent encounter for fracture with routine healing: Secondary | ICD-10-CM | POA: Diagnosis not present

## 2019-09-18 DIAGNOSIS — F419 Anxiety disorder, unspecified: Secondary | ICD-10-CM | POA: Diagnosis not present

## 2019-09-18 DIAGNOSIS — I110 Hypertensive heart disease with heart failure: Secondary | ICD-10-CM | POA: Diagnosis not present

## 2019-09-18 DIAGNOSIS — I5022 Chronic systolic (congestive) heart failure: Secondary | ICD-10-CM | POA: Diagnosis not present

## 2019-09-18 DIAGNOSIS — J449 Chronic obstructive pulmonary disease, unspecified: Secondary | ICD-10-CM | POA: Diagnosis not present

## 2019-09-18 DIAGNOSIS — M1990 Unspecified osteoarthritis, unspecified site: Secondary | ICD-10-CM | POA: Diagnosis not present

## 2019-09-20 DIAGNOSIS — S32040D Wedge compression fracture of fourth lumbar vertebra, subsequent encounter for fracture with routine healing: Secondary | ICD-10-CM | POA: Diagnosis not present

## 2019-09-20 DIAGNOSIS — I110 Hypertensive heart disease with heart failure: Secondary | ICD-10-CM | POA: Diagnosis not present

## 2019-09-20 DIAGNOSIS — I700 Atherosclerosis of aorta: Secondary | ICD-10-CM | POA: Diagnosis not present

## 2019-09-20 DIAGNOSIS — J449 Chronic obstructive pulmonary disease, unspecified: Secondary | ICD-10-CM | POA: Diagnosis not present

## 2019-09-20 DIAGNOSIS — F329 Major depressive disorder, single episode, unspecified: Secondary | ICD-10-CM | POA: Diagnosis not present

## 2019-09-20 DIAGNOSIS — M1990 Unspecified osteoarthritis, unspecified site: Secondary | ICD-10-CM | POA: Diagnosis not present

## 2019-09-20 DIAGNOSIS — I5022 Chronic systolic (congestive) heart failure: Secondary | ICD-10-CM | POA: Diagnosis not present

## 2019-09-20 DIAGNOSIS — S32020D Wedge compression fracture of second lumbar vertebra, subsequent encounter for fracture with routine healing: Secondary | ICD-10-CM | POA: Diagnosis not present

## 2019-09-20 DIAGNOSIS — F419 Anxiety disorder, unspecified: Secondary | ICD-10-CM | POA: Diagnosis not present

## 2019-09-23 DIAGNOSIS — M5136 Other intervertebral disc degeneration, lumbar region: Secondary | ICD-10-CM | POA: Diagnosis not present

## 2019-09-23 DIAGNOSIS — M5416 Radiculopathy, lumbar region: Secondary | ICD-10-CM | POA: Diagnosis not present

## 2019-09-25 DIAGNOSIS — I110 Hypertensive heart disease with heart failure: Secondary | ICD-10-CM | POA: Diagnosis not present

## 2019-09-25 DIAGNOSIS — I700 Atherosclerosis of aorta: Secondary | ICD-10-CM | POA: Diagnosis not present

## 2019-09-25 DIAGNOSIS — F329 Major depressive disorder, single episode, unspecified: Secondary | ICD-10-CM | POA: Diagnosis not present

## 2019-09-25 DIAGNOSIS — M1990 Unspecified osteoarthritis, unspecified site: Secondary | ICD-10-CM | POA: Diagnosis not present

## 2019-09-25 DIAGNOSIS — J449 Chronic obstructive pulmonary disease, unspecified: Secondary | ICD-10-CM | POA: Diagnosis not present

## 2019-09-25 DIAGNOSIS — I5022 Chronic systolic (congestive) heart failure: Secondary | ICD-10-CM | POA: Diagnosis not present

## 2019-09-25 DIAGNOSIS — F419 Anxiety disorder, unspecified: Secondary | ICD-10-CM | POA: Diagnosis not present

## 2019-09-25 DIAGNOSIS — S32040D Wedge compression fracture of fourth lumbar vertebra, subsequent encounter for fracture with routine healing: Secondary | ICD-10-CM | POA: Diagnosis not present

## 2019-09-25 DIAGNOSIS — S32020D Wedge compression fracture of second lumbar vertebra, subsequent encounter for fracture with routine healing: Secondary | ICD-10-CM | POA: Diagnosis not present

## 2019-09-27 DIAGNOSIS — F329 Major depressive disorder, single episode, unspecified: Secondary | ICD-10-CM | POA: Diagnosis not present

## 2019-09-27 DIAGNOSIS — F419 Anxiety disorder, unspecified: Secondary | ICD-10-CM | POA: Diagnosis not present

## 2019-09-27 DIAGNOSIS — M1990 Unspecified osteoarthritis, unspecified site: Secondary | ICD-10-CM | POA: Diagnosis not present

## 2019-09-27 DIAGNOSIS — I700 Atherosclerosis of aorta: Secondary | ICD-10-CM | POA: Diagnosis not present

## 2019-09-27 DIAGNOSIS — J449 Chronic obstructive pulmonary disease, unspecified: Secondary | ICD-10-CM | POA: Diagnosis not present

## 2019-09-27 DIAGNOSIS — S32040D Wedge compression fracture of fourth lumbar vertebra, subsequent encounter for fracture with routine healing: Secondary | ICD-10-CM | POA: Diagnosis not present

## 2019-09-27 DIAGNOSIS — S32020D Wedge compression fracture of second lumbar vertebra, subsequent encounter for fracture with routine healing: Secondary | ICD-10-CM | POA: Diagnosis not present

## 2019-09-27 DIAGNOSIS — I110 Hypertensive heart disease with heart failure: Secondary | ICD-10-CM | POA: Diagnosis not present

## 2019-09-27 DIAGNOSIS — I5022 Chronic systolic (congestive) heart failure: Secondary | ICD-10-CM | POA: Diagnosis not present

## 2019-09-30 DIAGNOSIS — S32040D Wedge compression fracture of fourth lumbar vertebra, subsequent encounter for fracture with routine healing: Secondary | ICD-10-CM | POA: Diagnosis not present

## 2019-09-30 DIAGNOSIS — M1990 Unspecified osteoarthritis, unspecified site: Secondary | ICD-10-CM | POA: Diagnosis not present

## 2019-09-30 DIAGNOSIS — I5022 Chronic systolic (congestive) heart failure: Secondary | ICD-10-CM | POA: Diagnosis not present

## 2019-09-30 DIAGNOSIS — F329 Major depressive disorder, single episode, unspecified: Secondary | ICD-10-CM | POA: Diagnosis not present

## 2019-09-30 DIAGNOSIS — J449 Chronic obstructive pulmonary disease, unspecified: Secondary | ICD-10-CM | POA: Diagnosis not present

## 2019-09-30 DIAGNOSIS — I700 Atherosclerosis of aorta: Secondary | ICD-10-CM | POA: Diagnosis not present

## 2019-09-30 DIAGNOSIS — F419 Anxiety disorder, unspecified: Secondary | ICD-10-CM | POA: Diagnosis not present

## 2019-09-30 DIAGNOSIS — I110 Hypertensive heart disease with heart failure: Secondary | ICD-10-CM | POA: Diagnosis not present

## 2019-09-30 DIAGNOSIS — S32020D Wedge compression fracture of second lumbar vertebra, subsequent encounter for fracture with routine healing: Secondary | ICD-10-CM | POA: Diagnosis not present

## 2019-10-04 ENCOUNTER — Telehealth: Payer: Self-pay | Admitting: *Deleted

## 2019-10-04 DIAGNOSIS — S32020D Wedge compression fracture of second lumbar vertebra, subsequent encounter for fracture with routine healing: Secondary | ICD-10-CM | POA: Diagnosis not present

## 2019-10-04 DIAGNOSIS — J449 Chronic obstructive pulmonary disease, unspecified: Secondary | ICD-10-CM | POA: Diagnosis not present

## 2019-10-04 DIAGNOSIS — I5022 Chronic systolic (congestive) heart failure: Secondary | ICD-10-CM | POA: Diagnosis not present

## 2019-10-04 DIAGNOSIS — M1990 Unspecified osteoarthritis, unspecified site: Secondary | ICD-10-CM | POA: Diagnosis not present

## 2019-10-04 DIAGNOSIS — I110 Hypertensive heart disease with heart failure: Secondary | ICD-10-CM | POA: Diagnosis not present

## 2019-10-04 DIAGNOSIS — I700 Atherosclerosis of aorta: Secondary | ICD-10-CM | POA: Diagnosis not present

## 2019-10-04 DIAGNOSIS — F419 Anxiety disorder, unspecified: Secondary | ICD-10-CM | POA: Diagnosis not present

## 2019-10-04 DIAGNOSIS — F329 Major depressive disorder, single episode, unspecified: Secondary | ICD-10-CM | POA: Diagnosis not present

## 2019-10-04 DIAGNOSIS — S32040D Wedge compression fracture of fourth lumbar vertebra, subsequent encounter for fracture with routine healing: Secondary | ICD-10-CM | POA: Diagnosis not present

## 2019-10-04 NOTE — Telephone Encounter (Signed)
(  10/04/19) Left message for pt to notify them that it is time to schedule annual low dose lung cancer screening CT scan. Instructed patient to call back to verify information prior to the scan being scheduled SRW

## 2019-10-08 DIAGNOSIS — M810 Age-related osteoporosis without current pathological fracture: Secondary | ICD-10-CM | POA: Diagnosis not present

## 2019-10-08 DIAGNOSIS — F3342 Major depressive disorder, recurrent, in full remission: Secondary | ICD-10-CM | POA: Diagnosis not present

## 2019-10-08 DIAGNOSIS — I712 Thoracic aortic aneurysm, without rupture: Secondary | ICD-10-CM | POA: Diagnosis not present

## 2019-10-08 DIAGNOSIS — S32040G Wedge compression fracture of fourth lumbar vertebra, subsequent encounter for fracture with delayed healing: Secondary | ICD-10-CM | POA: Diagnosis not present

## 2019-10-08 DIAGNOSIS — D696 Thrombocytopenia, unspecified: Secondary | ICD-10-CM | POA: Diagnosis not present

## 2019-10-08 DIAGNOSIS — I471 Supraventricular tachycardia: Secondary | ICD-10-CM | POA: Diagnosis not present

## 2019-10-08 DIAGNOSIS — K52832 Lymphocytic colitis: Secondary | ICD-10-CM | POA: Diagnosis not present

## 2019-10-08 DIAGNOSIS — I5022 Chronic systolic (congestive) heart failure: Secondary | ICD-10-CM | POA: Diagnosis not present

## 2019-10-08 DIAGNOSIS — S32000A Wedge compression fracture of unspecified lumbar vertebra, initial encounter for closed fracture: Secondary | ICD-10-CM | POA: Diagnosis not present

## 2019-10-08 DIAGNOSIS — I700 Atherosclerosis of aorta: Secondary | ICD-10-CM | POA: Diagnosis not present

## 2019-10-08 DIAGNOSIS — I110 Hypertensive heart disease with heart failure: Secondary | ICD-10-CM | POA: Diagnosis not present

## 2019-10-09 ENCOUNTER — Ambulatory Visit
Admission: RE | Admit: 2019-10-09 | Discharge: 2019-10-09 | Disposition: A | Discharge status: Home / Self Care | Payer: Medicare HMO | Source: Ambulatory Visit | Attending: Orthopedic Surgery | Admitting: Orthopedic Surgery

## 2019-10-09 ENCOUNTER — Other Ambulatory Visit: Payer: Self-pay | Admitting: Orthopedic Surgery

## 2019-10-09 ENCOUNTER — Other Ambulatory Visit: Payer: Self-pay

## 2019-10-09 DIAGNOSIS — S32030A Wedge compression fracture of third lumbar vertebra, initial encounter for closed fracture: Secondary | ICD-10-CM | POA: Diagnosis not present

## 2019-10-09 DIAGNOSIS — S32020D Wedge compression fracture of second lumbar vertebra, subsequent encounter for fracture with routine healing: Secondary | ICD-10-CM | POA: Diagnosis not present

## 2019-10-10 ENCOUNTER — Encounter
Admission: RE | Disposition: A | Discharge status: Home / Self Care | Payer: Self-pay | Source: Home / Self Care | Attending: Orthopedic Surgery

## 2019-10-10 ENCOUNTER — Ambulatory Visit: Payer: Medicare HMO

## 2019-10-10 ENCOUNTER — Other Ambulatory Visit
Admission: RE | Admit: 2019-10-10 | Discharge: 2019-10-10 | Disposition: A | Discharge status: Home / Self Care | Payer: Medicare HMO | Source: Ambulatory Visit | Attending: Orthopedic Surgery | Admitting: Orthopedic Surgery

## 2019-10-10 ENCOUNTER — Other Ambulatory Visit: Payer: Self-pay

## 2019-10-10 ENCOUNTER — Ambulatory Visit
Admission: RE | Admit: 2019-10-10 | Discharge: 2019-10-10 | Disposition: A | Discharge status: Home / Self Care | Payer: Medicare HMO | Attending: Orthopedic Surgery | Admitting: Orthopedic Surgery

## 2019-10-10 ENCOUNTER — Ambulatory Visit: Payer: Medicare HMO | Admitting: Anesthesiology

## 2019-10-10 DIAGNOSIS — G9519 Other vascular myelopathies: Secondary | ICD-10-CM | POA: Insufficient documentation

## 2019-10-10 DIAGNOSIS — F17200 Nicotine dependence, unspecified, uncomplicated: Secondary | ICD-10-CM | POA: Insufficient documentation

## 2019-10-10 DIAGNOSIS — Z951 Presence of aortocoronary bypass graft: Secondary | ICD-10-CM | POA: Diagnosis not present

## 2019-10-10 DIAGNOSIS — Z880 Allergy status to penicillin: Secondary | ICD-10-CM | POA: Diagnosis not present

## 2019-10-10 DIAGNOSIS — Z85828 Personal history of other malignant neoplasm of skin: Secondary | ICD-10-CM | POA: Diagnosis not present

## 2019-10-10 DIAGNOSIS — Z20822 Contact with and (suspected) exposure to covid-19: Secondary | ICD-10-CM | POA: Insufficient documentation

## 2019-10-10 DIAGNOSIS — I110 Hypertensive heart disease with heart failure: Secondary | ICD-10-CM | POA: Diagnosis not present

## 2019-10-10 DIAGNOSIS — I5022 Chronic systolic (congestive) heart failure: Secondary | ICD-10-CM | POA: Diagnosis not present

## 2019-10-10 DIAGNOSIS — J449 Chronic obstructive pulmonary disease, unspecified: Secondary | ICD-10-CM | POA: Diagnosis not present

## 2019-10-10 DIAGNOSIS — Z886 Allergy status to analgesic agent status: Secondary | ICD-10-CM | POA: Diagnosis not present

## 2019-10-10 DIAGNOSIS — F329 Major depressive disorder, single episode, unspecified: Secondary | ICD-10-CM | POA: Insufficient documentation

## 2019-10-10 DIAGNOSIS — M8088XA Other osteoporosis with current pathological fracture, vertebra(e), initial encounter for fracture: Secondary | ICD-10-CM | POA: Diagnosis not present

## 2019-10-10 DIAGNOSIS — Z9889 Other specified postprocedural states: Secondary | ICD-10-CM | POA: Insufficient documentation

## 2019-10-10 DIAGNOSIS — Z952 Presence of prosthetic heart valve: Secondary | ICD-10-CM | POA: Insufficient documentation

## 2019-10-10 DIAGNOSIS — Z888 Allergy status to other drugs, medicaments and biological substances status: Secondary | ICD-10-CM | POA: Diagnosis not present

## 2019-10-10 DIAGNOSIS — Z419 Encounter for procedure for purposes other than remedying health state, unspecified: Secondary | ICD-10-CM

## 2019-10-10 DIAGNOSIS — Z981 Arthrodesis status: Secondary | ICD-10-CM | POA: Diagnosis not present

## 2019-10-10 DIAGNOSIS — K219 Gastro-esophageal reflux disease without esophagitis: Secondary | ICD-10-CM | POA: Insufficient documentation

## 2019-10-10 DIAGNOSIS — Z885 Allergy status to narcotic agent status: Secondary | ICD-10-CM | POA: Insufficient documentation

## 2019-10-10 DIAGNOSIS — G959 Disease of spinal cord, unspecified: Secondary | ICD-10-CM | POA: Diagnosis not present

## 2019-10-10 DIAGNOSIS — F419 Anxiety disorder, unspecified: Secondary | ICD-10-CM | POA: Diagnosis not present

## 2019-10-10 DIAGNOSIS — M4856XA Collapsed vertebra, not elsewhere classified, lumbar region, initial encounter for fracture: Secondary | ICD-10-CM | POA: Diagnosis not present

## 2019-10-10 DIAGNOSIS — S32030A Wedge compression fracture of third lumbar vertebra, initial encounter for closed fracture: Secondary | ICD-10-CM | POA: Diagnosis not present

## 2019-10-10 DIAGNOSIS — Z79899 Other long term (current) drug therapy: Secondary | ICD-10-CM | POA: Diagnosis not present

## 2019-10-10 DIAGNOSIS — Z7982 Long term (current) use of aspirin: Secondary | ICD-10-CM | POA: Insufficient documentation

## 2019-10-10 HISTORY — PX: KYPHOPLASTY: SHX5884

## 2019-10-10 LAB — RESPIRATORY PANEL BY RT PCR (FLU A&B, COVID)
Influenza A by PCR: NEGATIVE
Influenza B by PCR: NEGATIVE
SARS Coronavirus 2 by RT PCR: NEGATIVE

## 2019-10-10 SURGERY — KYPHOPLASTY
Anesthesia: General | Site: Back

## 2019-10-10 MED ORDER — OXYCODONE HCL 5 MG PO TABS
5.0000 mg | ORAL_TABLET | Freq: Once | ORAL | Status: AC
  Administered 2019-10-10: 5 mg via ORAL

## 2019-10-10 MED ORDER — CLINDAMYCIN PHOSPHATE 900 MG/50ML IV SOLN
INTRAVENOUS | Status: AC
  Filled 2019-10-10: qty 50

## 2019-10-10 MED ORDER — LIDOCAINE HCL (PF) 1 % IJ SOLN
INTRAMUSCULAR | Status: AC
  Filled 2019-10-10: qty 30

## 2019-10-10 MED ORDER — BUPIVACAINE-EPINEPHRINE (PF) 0.5% -1:200000 IJ SOLN
INTRAMUSCULAR | Status: DC | PRN
  Administered 2019-10-10: 30 mL

## 2019-10-10 MED ORDER — FENTANYL CITRATE (PF) 100 MCG/2ML IJ SOLN
INTRAMUSCULAR | Status: AC
  Administered 2019-10-10: 25 ug via INTRAVENOUS
  Filled 2019-10-10: qty 2

## 2019-10-10 MED ORDER — PROPOFOL 500 MG/50ML IV EMUL
INTRAVENOUS | Status: DC | PRN
  Administered 2019-10-10: 30 ug/kg/min via INTRAVENOUS

## 2019-10-10 MED ORDER — PROPOFOL 10 MG/ML IV BOLUS
INTRAVENOUS | Status: DC | PRN
  Administered 2019-10-10: 30 mg via INTRAVENOUS

## 2019-10-10 MED ORDER — CLINDAMYCIN PHOSPHATE 900 MG/50ML IV SOLN
900.0000 mg | INTRAVENOUS | Status: AC
  Administered 2019-10-10: 900 mg via INTRAVENOUS

## 2019-10-10 MED ORDER — PROPOFOL 500 MG/50ML IV EMUL
INTRAVENOUS | Status: AC
  Filled 2019-10-10: qty 50

## 2019-10-10 MED ORDER — FENTANYL CITRATE (PF) 100 MCG/2ML IJ SOLN
INTRAMUSCULAR | Status: AC
  Filled 2019-10-10: qty 2

## 2019-10-10 MED ORDER — IOHEXOL 180 MG/ML  SOLN
INTRAMUSCULAR | Status: DC | PRN
  Administered 2019-10-10: 40 mL

## 2019-10-10 MED ORDER — KETAMINE HCL 50 MG/ML IJ SOLN
INTRAMUSCULAR | Status: AC
  Filled 2019-10-10: qty 10

## 2019-10-10 MED ORDER — PROPOFOL 10 MG/ML IV BOLUS
INTRAVENOUS | Status: AC
  Filled 2019-10-10: qty 20

## 2019-10-10 MED ORDER — LIDOCAINE HCL (CARDIAC) PF 100 MG/5ML IV SOSY
PREFILLED_SYRINGE | INTRAVENOUS | Status: DC | PRN
  Administered 2019-10-10: 100 mg via INTRAVENOUS

## 2019-10-10 MED ORDER — BUPIVACAINE HCL (PF) 0.5 % IJ SOLN
INTRAMUSCULAR | Status: AC
  Filled 2019-10-10: qty 30

## 2019-10-10 MED ORDER — LACTATED RINGERS IV SOLN: Freq: Once | INTRAVENOUS | Status: AC

## 2019-10-10 MED ORDER — FENTANYL CITRATE (PF) 100 MCG/2ML IJ SOLN
INTRAMUSCULAR | Status: DC | PRN
  Administered 2019-10-10 (×4): 25 ug via INTRAVENOUS

## 2019-10-10 MED ORDER — LIDOCAINE HCL 1 % IJ SOLN
INTRAMUSCULAR | Status: DC | PRN
  Administered 2019-10-10: 40 mL

## 2019-10-10 MED ORDER — FENTANYL CITRATE (PF) 100 MCG/2ML IJ SOLN: 25.0000 ug | INTRAMUSCULAR | Status: DC | PRN

## 2019-10-10 MED ORDER — OXYCODONE HCL 5 MG PO TABS
ORAL_TABLET | ORAL | Status: AC
  Filled 2019-10-10: qty 1

## 2019-10-10 SURGICAL SUPPLY — 24 items
CEMENT KYPHON CX01A KIT/MIXER (Cement) ×3 IMPLANT
COVER WAND RF STERILE (DRAPES) ×3 IMPLANT
DERMABOND ADVANCED (GAUZE/BANDAGES/DRESSINGS) ×2
DERMABOND ADVANCED .7 DNX12 (GAUZE/BANDAGES/DRESSINGS) ×1 IMPLANT
DEVICE BIOPSY BONE KYPHX (INSTRUMENTS) ×3 IMPLANT
DRAPE C-ARM XRAY 36X54 (DRAPES) ×3 IMPLANT
DURAPREP 26ML APPLICATOR (WOUND CARE) ×3 IMPLANT
FEE RENTAL RFA GENERATOR (MISCELLANEOUS) IMPLANT
GLOVE SURG SYN 9.0  PF PI (GLOVE) ×3
GLOVE SURG SYN 9.0 PF PI (GLOVE) ×1 IMPLANT
GOWN SRG 2XL LVL 4 RGLN SLV (GOWNS) ×1 IMPLANT
GOWN STRL NON-REIN 2XL LVL4 (GOWNS) ×3
GOWN STRL REUS W/ TWL LRG LVL3 (GOWN DISPOSABLE) ×1 IMPLANT
GOWN STRL REUS W/TWL LRG LVL3 (GOWN DISPOSABLE) ×3
KIT OSTEOCOOL BONE ACCESS 8G (MISCELLANEOUS) ×2 IMPLANT
PACK KYPHOPLASTY (MISCELLANEOUS) ×3 IMPLANT
RENTAL RFA  GENERATOR (MISCELLANEOUS)
RENTAL RFA GENERATOR (MISCELLANEOUS) IMPLANT
STRAP SAFETY 5IN WIDE (MISCELLANEOUS) ×3 IMPLANT
SYS CARTRIDGE BONE CEMENT 8ML (SYSTAGENIX WOUND MANAGEMENT) ×3
SYSTEM CARTRIDG BONE CEMNT 8ML (SYSTAGENIX WOUND MANAGEMENT) IMPLANT
SYSTEM GUN BONE FILLER SZ2 (MISCELLANEOUS) ×2 IMPLANT
TRAY KYPHOPAK 15/3 EXPRESS 1ST (MISCELLANEOUS) ×3 IMPLANT
TRAY KYPHOPAK 20/3 EXPRESS 1ST (MISCELLANEOUS) ×1 IMPLANT

## 2019-10-10 NOTE — Discharge Instructions (Addendum)
AMBULATORY SURGERY  DISCHARGE INSTRUCTIONS   1) The drugs that you were given will stay in your system until tomorrow so for the next 24 hours you should not:  A) Drive an automobile B) Make any legal decisions C) Drink any alcoholic beverage   2) You may resume regular meals tomorrow.  Today it is better to start with liquids and gradually work up to solid foods.  You may eat anything you prefer, but it is better to start with liquids, then soup and crackers, and gradually work up to solid foods.   3) Please notify your doctor immediately if you have any unusual bleeding, trouble breathing, redness and pain at the surgery site, drainage, fever, or pain not relieved by medication.    4) Additional Instructions April 12 at 345 folllow up      Please contact your physician with any problems or Same Day Surgery at [phone], Monday through Friday 6 am to 4 pm, or [place] at [facility] number at [phone].Take it easy today and tomorrow.  Pain medicine as directed. Avoid bending as much as possible for the next 2 weeks.  Avoid lifting over 5 pounds for the next 2 weeks. Remove Band-Aids on Saturday then okay to shower.

## 2019-10-10 NOTE — Anesthesia Preprocedure Evaluation (Signed)
Anesthesia Evaluation  Patient identified by MRN, date of birth, ID band Patient awake    Reviewed: Allergy & Precautions, H&P , NPO status , Patient's Chart, lab work & pertinent test results  History of Anesthesia Complications (+) history of anesthetic complications (confusion after heart surgery)  Airway Mallampati: II  TM Distance: >3 FB Neck ROM: full    Dental  (+) Edentulous Upper, Missing   Pulmonary neg pulmonary ROS, neg COPD, former smoker,    breath sounds clear to auscultation       Cardiovascular hypertension, + Valvular Problems/Murmurs (s/p AVR) AS  Rhythm:regular Rate:Normal     Neuro/Psych PSYCHIATRIC DISORDERS Anxiety Depression negative neurological ROS     GI/Hepatic Neg liver ROS, GERD  ,  Endo/Other  negative endocrine ROS  Renal/GU negative Renal ROS  negative genitourinary   Musculoskeletal   Abdominal   Peds  Hematology negative hematology ROS (+)   Anesthesia Other Findings Past Medical History: No date: Anxiety No date: Arthritis No date: Cancer ([place])     Comment:  skin cancer No date: Complication of anesthesia     Comment:  general anesthesia hard on memory after pt goes home No date: Depression No date: Fracture of one rib No date: GERD (gastroesophageal reflux disease)     Comment:  was on nexium but no longer needs to take this               medication No date: Hypertension No date: Macular degeneration of both eyes No date: Osteoporosis  Past Surgical History: No date: ARM SKIN LESION BIOPSY / EXCISION; Right No date: CHOLECYSTECTOMY 08/03/2015: COLONOSCOPY WITH PROPOFOL; N/A     Comment:  Procedure: COLONOSCOPY WITH PROPOFOL;  Surgeon: Hulen Luster, MD;  Location: [facility] ENDOSCOPY;  Service:               Gastroenterology;  Laterality: N/A; 11/26/2015: INTRAMEDULLARY (IM) NAIL INTERTROCHANTERIC; Right     Comment:  Procedure: INTRAMEDULLARY (IM) NAIL  INTERTROCHANTRIC;                Surgeon: Earnestine Leys, MD;  Location: [facility] ORS;                Service: Orthopedics;  Laterality: Right; 08/16/2019: KYPHOPLASTY; N/A     Comment:  Procedure: L4 KYPHOPLASTY;  Surgeon: Hessie Knows, MD;               Location: [facility] ORS;  Service: Orthopedics;  Laterality:               N/A; 08/29/2019: KYPHOPLASTY; N/A     Comment:  Procedure: L2 KYPHOPLASTY;  Surgeon: Hessie Knows, MD;               Location: [facility] ORS;  Service: Orthopedics;  Laterality:               N/A; 2019: open heart surgery     Comment:  valve replacement, aortic arch  aneurysm repair No date: TONSILLECTOMY No date: TUBAL LIGATION No date: WRIST RECONSTRUCTION; Right  BMI    Body Mass Index: 22.14 kg/m      Reproductive/Obstetrics negative OB ROS                             Anesthesia Physical  Anesthesia Plan  ASA: II  Anesthesia Plan: General   Post-op Pain Management:  Induction:   PONV Risk Score and Plan: Propofol infusion and TIVA  Airway Management Planned: Natural Airway and Nasal Cannula  Additional Equipment:   Intra-op Plan:   Post-operative Plan:   Informed Consent: I have reviewed the patients History and Physical, chart, labs and discussed the procedure including the risks, benefits and alternatives for the proposed anesthesia with the patient or authorized representative who has indicated his/her understanding and acceptance.     Dental Advisory Given  Plan Discussed with: Anesthesiologist, CRNA and Surgeon  Anesthesia Plan Comments:         Anesthesia Quick Evaluation

## 2019-10-10 NOTE — H&P (Signed)
Chief Complaint  Patient presents with  . Post Operative Visit  Kyphoplasties following compression fractures   History of the Present Illness: April Rogers is a 79 y.o. female here for evaluation status post L2 kyphoplasty on 08/29/2019. She has been complaining of a great deal of persistent pain, so she was asked to return today. Thoracolumbar spine x-rays were taken. She has a history of a prior right hip ORIF. She has undergone L2 and L4 kyphoplasties.   The patient presents with her daughter who states last Friday the physical therapist started a new exercise called the bridge with the patient with which she arched her back, and that is when she began having pain. She also tells me that the patient has had frequent muscle spasms and the daughter has tried a muscle relaxant. Her prescription was changed from the oxycodone 7.5 mg prescribed by Rachelle Hora, PA and the methocarbamol, as the patient went to the ER with toxicity while taking that medication. Dr. Caryl Comes prescribed oxycodone 5 mg and a trial of another muscle relaxant.   The patient's daughter describes the patient's daily routine as waking in the morning, rolling onto the side of the bed and sitting up in excruciating pain. Standing she gets a bit of relief, but the patient can only stand for 3 minutes. Her left leg has gotten weaker since last week.   The patient takes no anticoagulant. She has taken aspirin 81 mg since open heart surgery approximately 1 year ago.   The patient has been staying with her daughter since 08/2019.   I have reviewed past medical, surgical, social and family history, and allergies as documented in the EMR.  Past Medical History: Past Medical History:  Diagnosis Date  . AAA (abdominal aortic aneurysm) (CMS-HCC)  . Allergy 07/2014  . Anxiety  . Aortic atherosclerosis (CMS-HCC) 08/07/2017  By chest CT 1/19  . Arrhythmia 2019  . Arthritis  DDD  . CHF (congestive heart failure), NYHA class III,  chronic, systolic (CMS-HCC) [id number]  . Chronic systolic heart failure (CMS-HCC) 04/30/2018  . COPD (chronic obstructive pulmonary disease) (CMS-HCC)  . Current tobacco use 05/22/2018  . Current use of beta blocker 05/22/2018  . Current use of statin therapy 05/22/2018  . Depression  . GERD (gastroesophageal reflux disease)  . H/O scarlet fever 03/26/2018  . Heart murmur, unspecified 1969  . History of blood transfusion 05/2018  . History of cataract 1985  . History of rib fracture  . Hyperplastic colon polyp 08/03/2015  . Hypertension  . Macular degeneration  . Nonrheumatic aortic valve insufficiency 07/20/2017  Moderate aortic regurgitation on echocardiogram 1/19  . Osteoporosis, post-menopausal  a) vertebral compression fracture b) hip fracture c) left and right wrist fracture  . Rheumatic aortic valve insufficiency 05/22/2018  . Skin cancer  . Thoracic aortic aneurysm without rupture (CMS-HCC) 08/07/2017  CT chest 1/19 shows a 4.2 cm dilation of the ascending thoracic aorta. Yearly monitoring is recommended  . Thoracic ascending aortic aneurysm (CMS-HCC) 04/23/2018  4 cm ascending thoracic aneurysm  . Tobacco dependence   Past Surgical History: Past Surgical History:  Procedure Laterality Date  . ARM SKIN LESION BIOPSY / EXCISION Right  Skin cancer removal  . CARDIAC VALVE REPLACEMENT  . CATARACT EXTRACTION 2008  . CHOLECYSTECTOMY  . COLONOSCOPY 08/03/2015  Hyperplastic colon polyp/No Repeat/PYO  . COLONOSCOPY 12/15/2017  Tubular adenoma of the colon/Repeat 101yrs/[facility]  . CORONARY ARTERY BYPASS GRAFT 05/2018  . FRACTURE SURGERY 09/2015  . INTRAMEDULLARY NAIL PINNING RIGHT  HIP 11/2015  . RECONSTRUCTION WRIST Right  . REPAIR ASCENDING AORTA ANEURYSM N/A 05/29/2018  Procedure: ASCENDING AORTA GRAFT, WITH CARDIOPULMONARY BYPASS, INCLUDES VALVE SUSPENSION, WHEN PERFORMED; Surgeon: Danna Hefty, MD; Location: [facility]; Service: Cardiothoracic; Laterality: N/A;   . REPAIR THORACIC AORTIC ANEURYSM W/TRANSVERSE ARCH GRAFT N/A 05/29/2018  Procedure: TRANSVERSE ARCH GRAFT, WITH CARDIOPULMONARY BYPASS, Hemi-arch; Surgeon: Danna Hefty, MD; Location: [facility]; Service: Cardiothoracic; Laterality: N/A;  . REPLACEMENT AORTIC VALVE N/A 05/29/2018  Procedure: ADULT, REPLACEMENT, AORTIC VALVE, OPEN, WITH CARDIOPULMONARY BYPASS, STERNOTOMY; WITH PROSTHETIC VALVE OTHER THAN HOMOGRAFT OR STENTLESS VALVE; Surgeon: Danna Hefty, MD; Location: [facility]; Service: Cardiothoracic; Laterality: N/A;  . TONSILLECTOMY  . TUBAL LIGATION   Past Family History: Family History  Problem Relation Age of Onset  . Lung cancer Father  . Prostate cancer Father  . High blood pressure (Hypertension) Mother  . Osteoporosis (Thinning of bones) Mother  . Lupus Son  . Cancer Brother  bladder  . Coronary Artery Disease (Blocked arteries around heart) Brother  . Cancer Maternal Grandmother  Gallbladder   Medications: Current Outpatient Medications Ordered in Epic  Medication Sig Dispense Refill  . aspirin 81 MG EC tablet Take 1 tablet by mouth once daily  . atorvastatin (LIPITOR) 20 MG tablet Take 20 mg by mouth once daily  . budesonide (ENTOCORT EC) 3 mg EC capsule Take 9 mg by mouth every morning  . cholecalciferol (CHOLECALCIFEROL) 1000 unit tablet Take by mouth  . colestipoL (COLESTID) 1 gram tablet Take 1 g by mouth as needed  . haloperidoL (HALDOL) 0.5 MG tablet Take 1 tablet (0.5 mg total) by mouth nightly as needed (as needed for anxiety) for up to 30 days 20 tablet 2  . metoprolol tartrate (LOPRESSOR) 25 MG tablet TAKE 1 TABLET TWICE DAILY 180 tablet 2  . multivitamin with minerals, EYE, (PRESERVISION AREDS-2) soft gel capsule Take 1 capsule by mouth 2 (two) times daily with meals  . oxyCODONE (ROXICODONE) 5 MG immediate release tablet Take 1 tablet (5 mg total) by mouth every 6 (six) hours as needed for Pain 42 tablet 0  . tiZANidine  (ZANAFLEX) 2 MG tablet Take 1 tablet (2 mg total) by mouth every 8 (eight) hours as needed (back pain) 90 tablet 0  . triamcinolone 0.5 % cream Apply topically 2 (two) times daily as needed 15 g 5  . venlafaxine (EFFEXOR-XR) 150 MG XR capsule TAKE 1 CAPSULE TWICE DAILY 180 capsule 3  . vitamin B complex (B COMPLEX 1 ORAL) Take by mouth   No current Epic-ordered facility-administered medications on file.   Allergies: Allergies  Allergen Reactions  . Actonel [Risedronate] Muscle Pain  . Alendronate Muscle Pain  . Apple Juice Hives  . Bupropion Other (See Comments)  nightmares  . Chantix [Varenicline] Other (See Comments)  nightmares  . Codeine Hallucination  . Grape Juice Hives  . Meloxicam Other (See Comments)  edema  . Penicillins Hives  . Wellbutrin [Bupropion Hcl] Other (See Comments)  intolerant  . Wine [employer]  . Alcohol Hives    Body mass index is 21.97 kg/m.  Review of Systems: A comprehensive 14 point ROS was performed, reviewed, and the pertinent orthopaedic findings are documented in the HPI.  Vitals:  10/09/19 1323  BP: 116/72   General Physical Examination:  General/Constitutional: No apparent distress: well-nourished and well developed. Eyes: Pupils equal, round with synchronous movement. Lungs: Clear to auscultation HEENT: Normal Vascular: No edema, swelling or tenderness, except as noted  in detailed exam. Cardiac: Heart rate and rhythm is regular. Integumentary: No impressive skin lesions present, except as noted in detailed exam. Neuro/Psych: Normal mood and affect, oriented to person, place and time.  Musculoskeletal Examination: On exam, no tenderness to the sacrum. Tenderness to the thoracolumbar spine.   Radiographs: AP and lateral x-rays of the thoracolumbar spine were obtained today. These show prior kyphoplasties, some compression deformity to L3, compression deformities to L1 and T12. These are unchanged and are from prior injuries.    X-ray Impression No change in the thoracolumbar spine.   Assessment: ICD-10-CM  1. Closed wedge compression fracture of L3 vertebra, initial encounter (CMS-HCC) S32.030A  2. Closed compression fracture of L2 lumbar vertebra, initial encounter (CMS-HCC) S32.020A  3. Status post kyphoplasty L2 Z98.890   Plan: The patient has clinical findings of thoracolumbar spine pain.  We discussed the patient's x-ray findings. I explained it is possible she may have a new fracture in the lumbar spine, but arching her back should not have aggravated it.   With her level of pain and symptoms, I am ordering an MRI stat to assess for a new fracture, and possibly preforming a kyphoplasty tomorrow.   Surgical Risks:  The nature of the condition and the proposed procedure has been reviewed in detail with the patient. Surgical versus non-surgical options and prognosis for recovery have been reviewed and the inherent risks and benefits of each have been discussed including the risks of infection, bleeding, injury to nerves/blood vessels/tendons, incomplete relief of symptoms, persisting pain and/or stiffness, loss of function, complex regional pain syndrome, failure of the procedure, as appropriate.  Scribe Attestation: I, Dawn Royse, am acting as scribe for [employer], MD    Electronically signed by Lauris Poag, MD at 10/10/2019 1:54 PM EDT   MRI showed new compression fracture at L3 as well as edema at the level of L2 prior L2 kyphoplasty Reviewed paper H+P, will be scanned into chart. No changes noted.

## 2019-10-10 NOTE — Anesthesia Postprocedure Evaluation (Signed)
Anesthesia Post Note  Patient: April Rogers  Procedure(s) Performed: L3 KYPHOPLASTY (N/A Back)  Patient location during evaluation: PACU Anesthesia Type: General Level of consciousness: awake and alert Pain management: pain level controlled Vital Signs Assessment: post-procedure vital signs reviewed and stable Respiratory status: spontaneous breathing, nonlabored ventilation, respiratory function stable and patient connected to nasal cannula oxygen Cardiovascular status: blood pressure returned to baseline and stable Postop Assessment: no apparent nausea or vomiting Anesthetic complications: no     Last Vitals:  Vitals:   10/10/19 1613 10/10/19 1628  BP: (!) 118/41 (!) 134/56  Pulse: 73 67  Resp: 15 17  Temp:    SpO2: 93% 92%    Last Pain:  Vitals:   10/10/19 1628  TempSrc:   PainSc: 3                  Arita Miss

## 2019-10-10 NOTE — Transfer of Care (Signed)
Immediate Anesthesia Transfer of Care Note  Patient: April Rogers  Procedure(s) Performed: L3 KYPHOPLASTY (N/A Back)  Patient Location: PACU  Anesthesia Type:General  Level of Consciousness: awake  Airway & Oxygen Therapy: Patient Spontanous Breathing and Patient connected to face mask oxygen  Post-op Assessment: Report given to RN and Post -op Vital signs reviewed and stable  Post vital signs: Reviewed  Last Vitals:  Vitals Value Taken Time  BP 137/49 10/10/19 1559  Temp    Pulse 77 10/10/19 1600  Resp 19 10/10/19 1600  SpO2 99 % 10/10/19 1600  Vitals shown include unvalidated device data.  Last Pain:  Vitals:   10/10/19 1341  TempSrc: Tympanic  PainSc: 9          Complications: No apparent anesthesia complications

## 2019-10-10 NOTE — Op Note (Signed)
Date  10/10/2019  Time  4:02 pm   PATIENT: April Rogers   PRE-OPERATIVE DIAGNOSIS:  closed wedge compression fracture of L3, persistent edema at L2    POST-OPERATIVE DIAGNOSIS:  closed wedge compression fracture of L3   persistent edema at L2   PROCEDURE:  Procedure(s): KYPHOPLASTY L3, additional cement to L2  SURGEON: Laurene Footman, MD   ASSISTANTS: None   ANESTHESIA:   local and MAC   EBL:  No intake/output data recorded.   BLOOD ADMINISTERED:none   DRAINS: none    LOCAL MEDICATIONS USED:  MARCAINE    and XYLOCAINE    SPECIMEN:  none   DISPOSITION OF SPECIMEN: N/A   COUNTS:  YES   TOURNIQUET:  * No tourniquets in log *   IMPLANTS: Bone cement   DICTATION: .Dragon Dictation  patient was brought to the operating room and after adequate anesthesia was obtained the patient was placed prone.  C arm was brought in in good visualization of the affected level obtained on both AP and lateral projections.  After patient identification and timeout procedures were completed, local anesthetic was infiltrated with 10 cc 1% Xylocaine infiltrated subcutaneously.  This is done the area on the eac side of the planned approach.  The back was then prepped and draped in the usual sterile manner and repeat timeout procedure carried out.  A spinal needle was brought down to the pedicle on the right side of L3 and a 50-50 mix of 1% Xylocaine half percent Sensorcaine with epinephrine total of 20 cc injected.  After allowing this to set a small incision was made and the trocar was advanced into the vertebral body in an extrapedicular fashion one each side.  Biopsy was not obtained  Drilling was carried out balloon inserted with inflation to 3 cc to each.  When the cement was appropriate consistency 8 cc were injected into the vertebral body without extravasation, good fill superior to inferior endplates and from right to left sides along the inferior endplate. Access on the left at L3 was performed to  give additional cement, approximately 3 ccs added with some extravasation into the L1/l2 disc space After the cement had set the trochar was removed and permanent C-arm views obtained.  The wound was closed with Dermabond followed by Band-Aid   PLAN OF CARE: Discharge to home after PACU   PATIENT DISPOSITION:  PACU - hemodynamically stable.

## 2019-10-15 ENCOUNTER — Telehealth: Payer: Self-pay

## 2019-10-15 NOTE — Telephone Encounter (Signed)
Patient has been notified that the low dose lung cancer screening CT scan is due currently or will be in near future.  Spoke with patient and her daughter, she had recent open heart surgery as well as back surgery.  They are not interested in having another CT scan scheduled at this time.

## 2019-10-23 DIAGNOSIS — S32030A Wedge compression fracture of third lumbar vertebra, initial encounter for closed fracture: Secondary | ICD-10-CM | POA: Diagnosis not present

## 2019-10-23 DIAGNOSIS — S32020A Wedge compression fracture of second lumbar vertebra, initial encounter for closed fracture: Secondary | ICD-10-CM | POA: Diagnosis not present

## 2019-10-29 DIAGNOSIS — M5416 Radiculopathy, lumbar region: Secondary | ICD-10-CM | POA: Diagnosis not present

## 2019-10-29 DIAGNOSIS — M5136 Other intervertebral disc degeneration, lumbar region: Secondary | ICD-10-CM | POA: Diagnosis not present

## 2019-11-01 DIAGNOSIS — F3342 Major depressive disorder, recurrent, in full remission: Secondary | ICD-10-CM | POA: Diagnosis not present

## 2019-11-01 DIAGNOSIS — I712 Thoracic aortic aneurysm, without rupture: Secondary | ICD-10-CM | POA: Diagnosis not present

## 2019-11-01 DIAGNOSIS — M810 Age-related osteoporosis without current pathological fracture: Secondary | ICD-10-CM | POA: Diagnosis not present

## 2019-11-01 DIAGNOSIS — I110 Hypertensive heart disease with heart failure: Secondary | ICD-10-CM | POA: Diagnosis not present

## 2019-11-01 DIAGNOSIS — K219 Gastro-esophageal reflux disease without esophagitis: Secondary | ICD-10-CM | POA: Diagnosis not present

## 2019-11-01 DIAGNOSIS — I471 Supraventricular tachycardia: Secondary | ICD-10-CM | POA: Diagnosis not present

## 2019-11-01 DIAGNOSIS — I5022 Chronic systolic (congestive) heart failure: Secondary | ICD-10-CM | POA: Diagnosis not present

## 2019-11-01 DIAGNOSIS — E785 Hyperlipidemia, unspecified: Secondary | ICD-10-CM | POA: Diagnosis not present

## 2019-11-01 DIAGNOSIS — S32040G Wedge compression fracture of fourth lumbar vertebra, subsequent encounter for fracture with delayed healing: Secondary | ICD-10-CM | POA: Diagnosis not present

## 2020-03-03 DIAGNOSIS — I10 Essential (primary) hypertension: Secondary | ICD-10-CM | POA: Diagnosis not present

## 2020-03-03 DIAGNOSIS — E785 Hyperlipidemia, unspecified: Secondary | ICD-10-CM | POA: Diagnosis not present

## 2020-03-03 DIAGNOSIS — Z79899 Other long term (current) drug therapy: Secondary | ICD-10-CM | POA: Diagnosis not present

## 2020-03-03 DIAGNOSIS — D696 Thrombocytopenia, unspecified: Secondary | ICD-10-CM | POA: Diagnosis not present

## 2020-04-13 DIAGNOSIS — H903 Sensorineural hearing loss, bilateral: Secondary | ICD-10-CM | POA: Diagnosis not present

## 2020-04-13 IMAGING — MR MR LUMBAR SPINE W/O CM
4 of 5 series · 32 of 48 positions shown · non-contrast
Comparison: Chest CTA 04/16/2019.

CLINICAL DATA: 78-year-old female with low back pain radiating to
both legs greater on the left. Symptoms for 3-4 weeks.

EXAM:
MRI LUMBAR SPINE WITHOUT CONTRAST
TECHNIQUE: Multiplanar, multisequence MR imaging of the lumbar spine was
performed. No intravenous contrast was administered.

[Series 9: T2 · sagittal · 4.0mm · 0.81mm/px · 8 of 17 slices shown (1 of 2)]
[im 1/17]
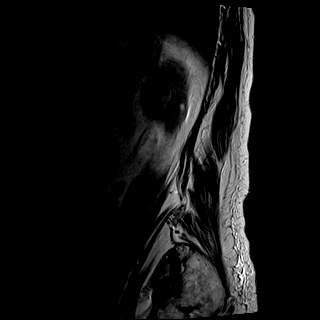
[im 3/17]
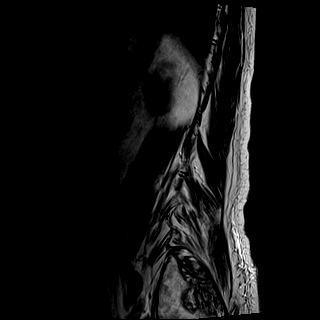
[im 5/17]
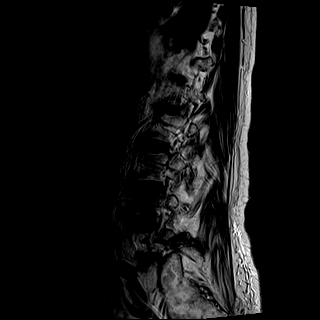
[im 7/17]
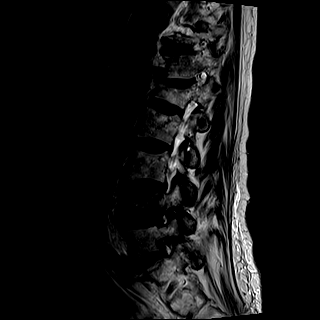
[im 10/17]
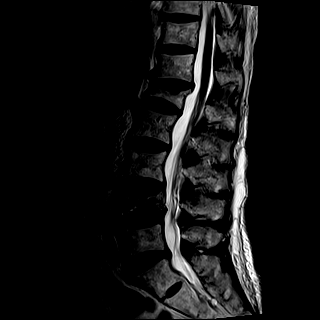
[im 12/17]
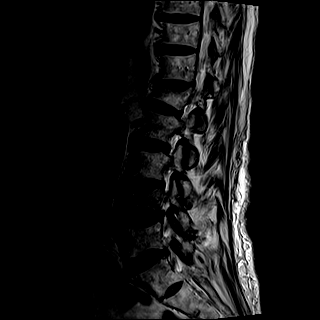
[im 14/17]
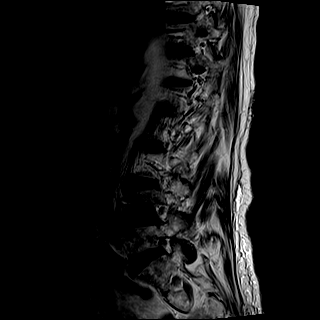
[im 17/17]
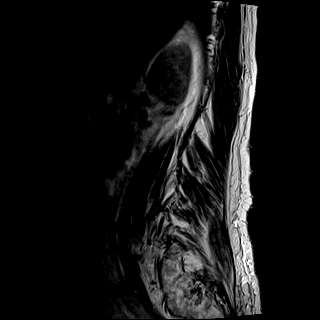

[Series 10: T1 · sagittal · 4.0mm · 0.81mm/px · 7 of 17 slices shown (1 of 2)]
[im 1/17]
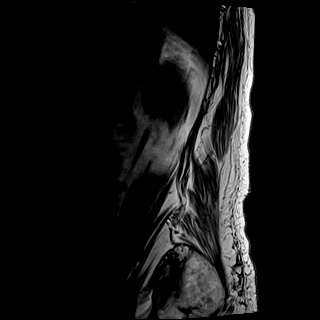
[im 3/17]
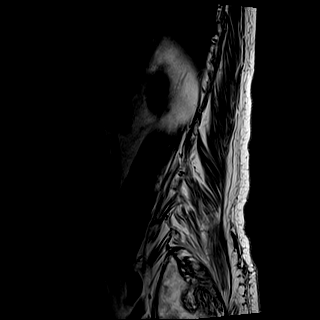
[im 6/17]
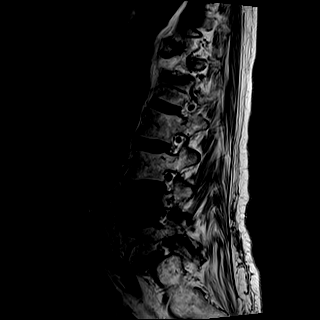
[im 9/17]
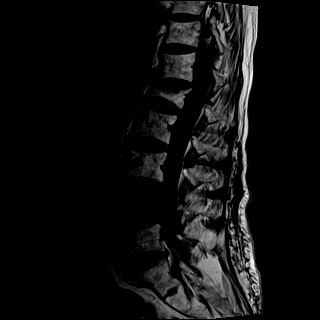
[im 11/17]
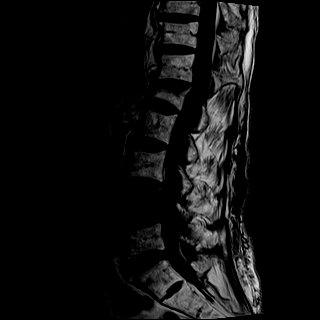
[im 14/17]
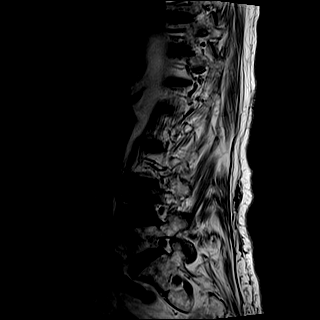
[im 17/17]
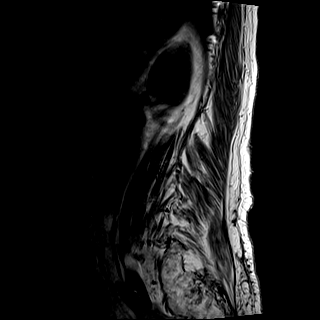

[Series 12: T2 · axial · 4.0mm · 0.78mm/px · z∈[-71,+121]mm · 9 of 32 slices shown (2 of 2)]
[im 1/32]
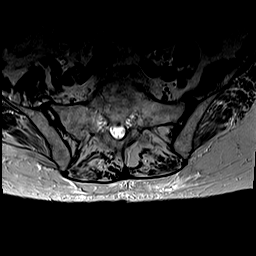
[im 6/32]
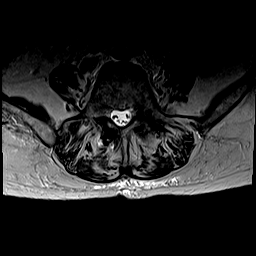
[im 11/32]
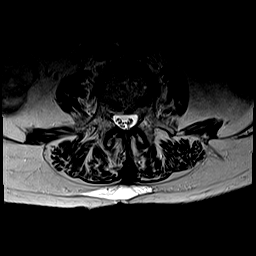
[im 13/32]
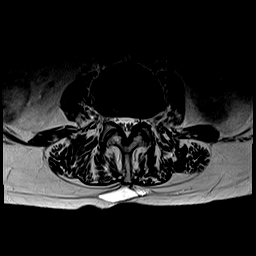
[im 16/32]
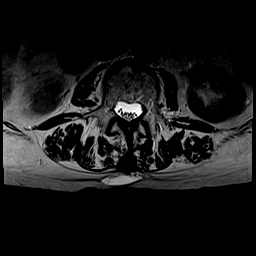
[im 19/32]
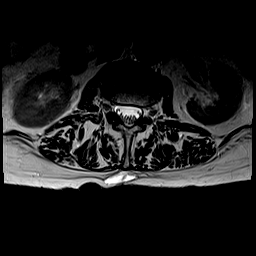
[im 21/32]
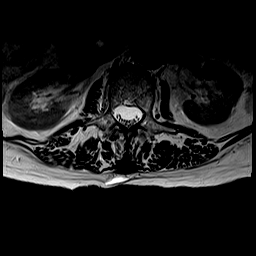
[im 26/32]
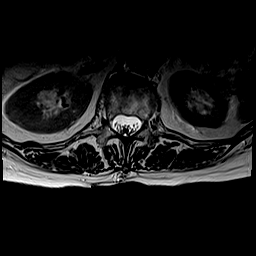
[im 32/32]
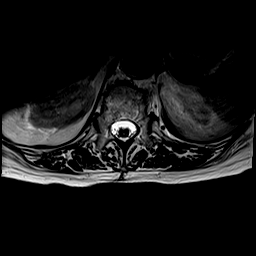

[Series 13: T1 · axial · 4.0mm · 0.39mm/px · z∈[-71,+92]mm · 8 of 32 slices shown (2 of 2)]
[im 1/32]
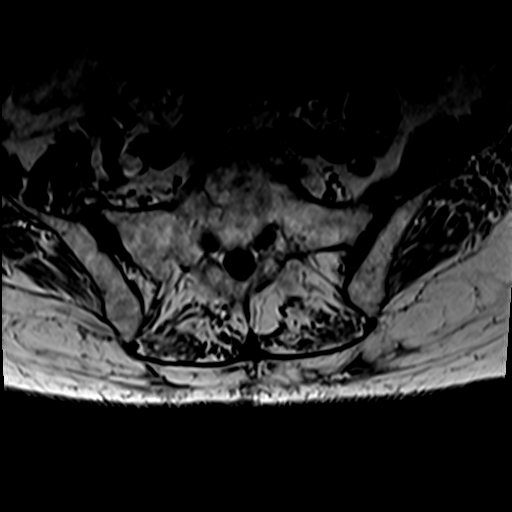
[im 6/32]
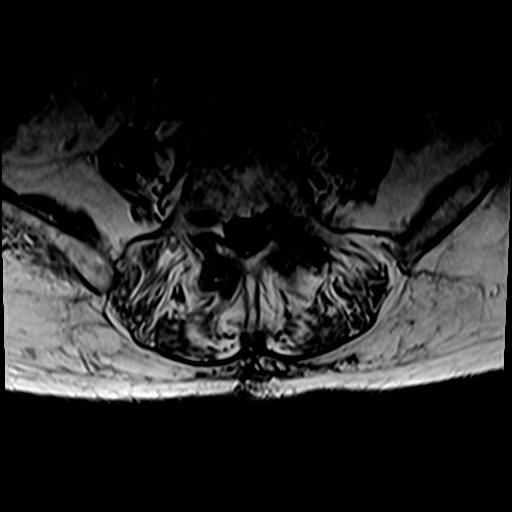
[im 11/32]
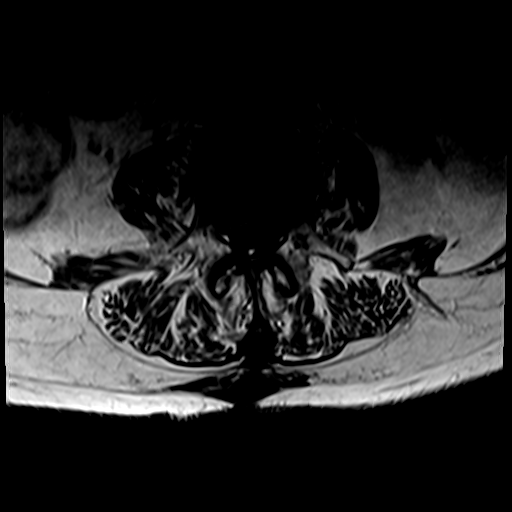
[im 13/32]
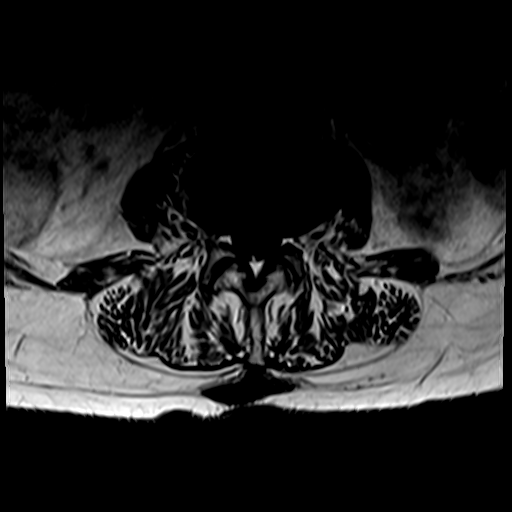
[im 16/32]
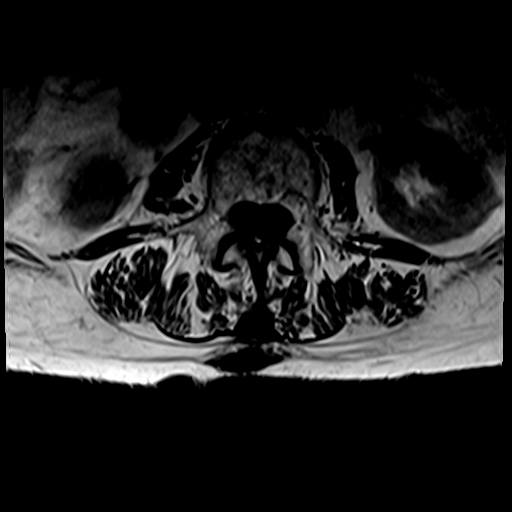
[im 19/32]
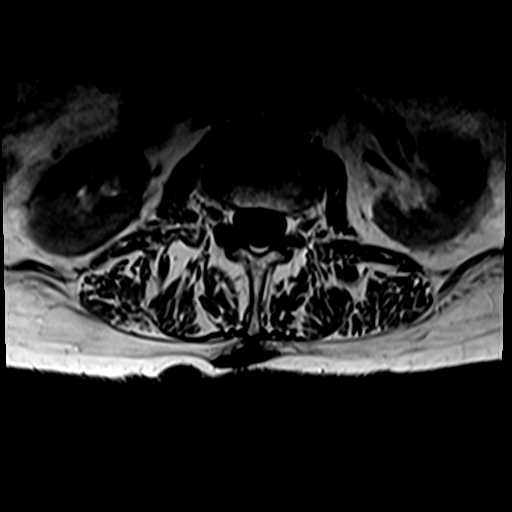
[im 21/32]
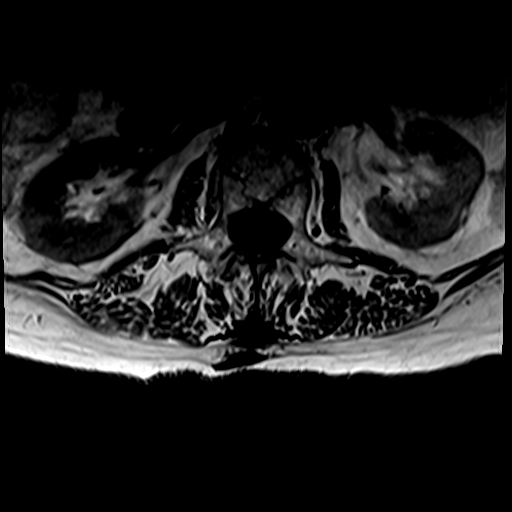
[im 26/32]
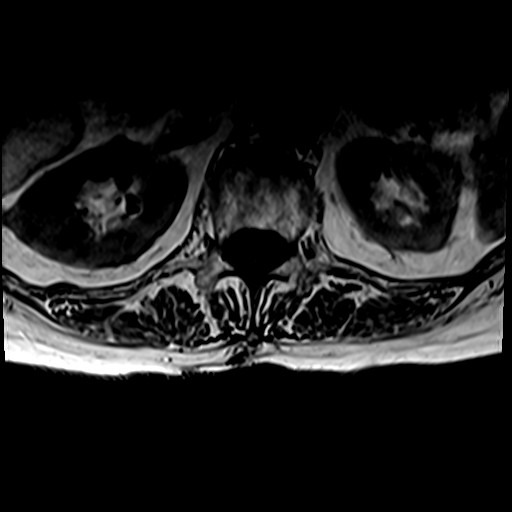

[32 of 48 positions shown; findings below may reference images not displayed]

FINDINGS: Segmentation:  Normal.

Alignment: Overall mild straightening of lumbar lordosis. Subtle
degenerative appearing retrolisthesis of L5 on S1.

Vertebrae: Osteopenia demonstrated on the comparison. There is
moderate to severe compression of the L4 vertebral body (loss of
central vertebral body height up to 65%) with abnormal marrow edema
throughout the vertebral body. Marrow edema does track into the left
L4 pedicle, with some superimposed marrow edema and regional soft
tissue inflammation about the left L4-L5 facets (series 11, image
12). No marrow edema in the right L4 pedicle or lamina. Mild
retropulsion of bone. No definite extraosseous extension of signal
abnormality from the vertebra.

Chronic L1 compression fracture stable since [REDACTED]. Other visible
lower thoracic and lumbar levels appear intact. There is minor
degenerative appearing L5 and S1 endplate marrow edema, but the
background bone marrow signal is normal. Intact visible sacrum and
SI joints.

Conus medullaris and cauda equina: Conus extends to the T12-L1
level. No lower spinal cord or conus signal abnormality.

Paraspinal and other soft tissues: Ectasia of the abdominal aorta.
Negative visible abdominal viscera. Normal paraspinal soft tissues
aside from mild soft tissue inflammation at the left L4-L5 facets
stated above.

Disc levels:

Overall mild for age lower spine degeneration. Notable levels
include:

T12-L1: Mild retropulsion of the posterosuperior L1 endplate plus
disc bulging, but no significant stenosis.

L3-L4: Mild circumferential disc bulge and L4 retropulsion plus mild
posterior element hypertrophy. Borderline to mild spinal and
bilateral L3 foraminal stenosis.

L4-L5: Mild retropulsion and circumferential disc bulge plus mild to
moderate posterior element hypertrophy. Mild spinal stenosis.
Moderate to severe left and moderate right L4 foraminal stenosis.

L5-S1: Circumferential disc bulge plus moderate to severe facet
hypertrophy which is greater on the left. Ligament flavum
hypertrophy. Mild spinal stenosis. No convincing lateral recess
stenosis. Moderate left L5 foraminal stenosis.
IMPRESSION: 1. Acute to subacute L4 vertebral body compression fracture. Favor
this is osteoporotic in nature.
Up to 65% loss of central vertebral body height and mild
retropulsion contributing to mild spinal stenosis, and moderate to
severe L4 foraminal stenosis greater on the left.
2. Chronic L1 compression fracture is stable since [DATE]. Generally mild for age underlying lumbar spine degeneration.
4. Ectatic abdominal aorta at risk for aneurysm development.
Recommend followup by ultrasound in 5 years.
This recommendation follows ACR consensus guidelines: White Paper of
the ACR Incidental Findings Committee II on Vascular Findings. [HOSPITAL] 3787; [DATE].
Aortic aneurysm NOS ([id number])

## 2020-04-21 DIAGNOSIS — H903 Sensorineural hearing loss, bilateral: Secondary | ICD-10-CM | POA: Diagnosis not present

## 2020-05-25 DIAGNOSIS — Z8679 Personal history of other diseases of the circulatory system: Secondary | ICD-10-CM | POA: Diagnosis not present

## 2020-05-25 DIAGNOSIS — Z87891 Personal history of nicotine dependence: Secondary | ICD-10-CM | POA: Diagnosis not present

## 2020-05-25 DIAGNOSIS — Z7982 Long term (current) use of aspirin: Secondary | ICD-10-CM | POA: Diagnosis not present

## 2020-05-25 DIAGNOSIS — Z9889 Other specified postprocedural states: Secondary | ICD-10-CM | POA: Diagnosis not present

## 2020-05-25 DIAGNOSIS — Z952 Presence of prosthetic heart valve: Secondary | ICD-10-CM | POA: Diagnosis not present

## 2020-05-25 DIAGNOSIS — I712 Thoracic aortic aneurysm, without rupture: Secondary | ICD-10-CM | POA: Diagnosis not present

## 2020-05-25 DIAGNOSIS — I081 Rheumatic disorders of both mitral and tricuspid valves: Secondary | ICD-10-CM | POA: Diagnosis not present

## 2020-05-25 DIAGNOSIS — Z48812 Encounter for surgical aftercare following surgery on the circulatory system: Secondary | ICD-10-CM | POA: Diagnosis not present

## 2020-05-25 DIAGNOSIS — Z79899 Other long term (current) drug therapy: Secondary | ICD-10-CM | POA: Diagnosis not present

## 2020-07-21 DIAGNOSIS — D696 Thrombocytopenia, unspecified: Secondary | ICD-10-CM | POA: Diagnosis not present

## 2020-07-21 DIAGNOSIS — I5022 Chronic systolic (congestive) heart failure: Secondary | ICD-10-CM | POA: Diagnosis not present

## 2020-07-21 DIAGNOSIS — I10 Essential (primary) hypertension: Secondary | ICD-10-CM | POA: Diagnosis not present

## 2020-07-21 DIAGNOSIS — F3342 Major depressive disorder, recurrent, in full remission: Secondary | ICD-10-CM | POA: Diagnosis not present

## 2020-07-21 DIAGNOSIS — J449 Chronic obstructive pulmonary disease, unspecified: Secondary | ICD-10-CM | POA: Diagnosis not present

## 2020-07-21 DIAGNOSIS — K52832 Lymphocytic colitis: Secondary | ICD-10-CM | POA: Diagnosis not present

## 2020-07-21 DIAGNOSIS — I110 Hypertensive heart disease with heart failure: Secondary | ICD-10-CM | POA: Diagnosis not present

## 2020-07-21 DIAGNOSIS — I471 Supraventricular tachycardia: Secondary | ICD-10-CM | POA: Diagnosis not present

## 2020-07-21 DIAGNOSIS — Z23 Encounter for immunization: Secondary | ICD-10-CM | POA: Diagnosis not present

## 2020-07-21 DIAGNOSIS — Z0000 Encounter for general adult medical examination without abnormal findings: Secondary | ICD-10-CM | POA: Diagnosis not present

## 2020-07-21 DIAGNOSIS — M810 Age-related osteoporosis without current pathological fracture: Secondary | ICD-10-CM | POA: Diagnosis not present

## 2020-07-21 DIAGNOSIS — I700 Atherosclerosis of aorta: Secondary | ICD-10-CM | POA: Diagnosis not present

## 2020-07-21 DIAGNOSIS — Z1331 Encounter for screening for depression: Secondary | ICD-10-CM | POA: Diagnosis not present

## 2020-07-21 DIAGNOSIS — I712 Thoracic aortic aneurysm, without rupture: Secondary | ICD-10-CM | POA: Diagnosis not present

## 2020-07-21 DIAGNOSIS — E785 Hyperlipidemia, unspecified: Secondary | ICD-10-CM | POA: Diagnosis not present

## 2020-07-21 DIAGNOSIS — E559 Vitamin D deficiency, unspecified: Secondary | ICD-10-CM | POA: Diagnosis not present

## 2020-08-20 DIAGNOSIS — H6122 Impacted cerumen, left ear: Secondary | ICD-10-CM | POA: Diagnosis not present

## 2020-10-26 DIAGNOSIS — I5022 Chronic systolic (congestive) heart failure: Secondary | ICD-10-CM | POA: Diagnosis not present

## 2020-10-26 DIAGNOSIS — I110 Hypertensive heart disease with heart failure: Secondary | ICD-10-CM | POA: Diagnosis not present

## 2020-10-26 DIAGNOSIS — K219 Gastro-esophageal reflux disease without esophagitis: Secondary | ICD-10-CM | POA: Diagnosis not present

## 2020-10-26 DIAGNOSIS — M810 Age-related osteoporosis without current pathological fracture: Secondary | ICD-10-CM | POA: Diagnosis not present

## 2020-10-26 DIAGNOSIS — F3342 Major depressive disorder, recurrent, in full remission: Secondary | ICD-10-CM | POA: Diagnosis not present

## 2020-10-26 DIAGNOSIS — I700 Atherosclerosis of aorta: Secondary | ICD-10-CM | POA: Diagnosis not present

## 2020-10-26 DIAGNOSIS — I471 Supraventricular tachycardia: Secondary | ICD-10-CM | POA: Diagnosis not present

## 2020-10-26 DIAGNOSIS — D696 Thrombocytopenia, unspecified: Secondary | ICD-10-CM | POA: Diagnosis not present

## 2020-10-26 DIAGNOSIS — I712 Thoracic aortic aneurysm, without rupture: Secondary | ICD-10-CM | POA: Diagnosis not present

## 2021-01-25 DIAGNOSIS — I10 Essential (primary) hypertension: Secondary | ICD-10-CM | POA: Diagnosis not present

## 2021-01-25 DIAGNOSIS — D696 Thrombocytopenia, unspecified: Secondary | ICD-10-CM | POA: Diagnosis not present

## 2021-01-25 DIAGNOSIS — E785 Hyperlipidemia, unspecified: Secondary | ICD-10-CM | POA: Diagnosis not present

## 2021-02-01 DIAGNOSIS — D696 Thrombocytopenia, unspecified: Secondary | ICD-10-CM | POA: Diagnosis not present

## 2021-02-01 DIAGNOSIS — Z952 Presence of prosthetic heart valve: Secondary | ICD-10-CM | POA: Diagnosis not present

## 2021-02-01 DIAGNOSIS — I110 Hypertensive heart disease with heart failure: Secondary | ICD-10-CM | POA: Diagnosis not present

## 2021-02-01 DIAGNOSIS — I712 Thoracic aortic aneurysm, without rupture: Secondary | ICD-10-CM | POA: Diagnosis not present

## 2021-02-01 DIAGNOSIS — I5022 Chronic systolic (congestive) heart failure: Secondary | ICD-10-CM | POA: Diagnosis not present

## 2021-02-01 DIAGNOSIS — Z9889 Other specified postprocedural states: Secondary | ICD-10-CM | POA: Diagnosis not present

## 2021-02-01 DIAGNOSIS — M810 Age-related osteoporosis without current pathological fracture: Secondary | ICD-10-CM | POA: Diagnosis not present

## 2021-02-01 DIAGNOSIS — I700 Atherosclerosis of aorta: Secondary | ICD-10-CM | POA: Diagnosis not present

## 2021-02-01 DIAGNOSIS — I471 Supraventricular tachycardia: Secondary | ICD-10-CM | POA: Diagnosis not present

## 2021-03-08 DIAGNOSIS — I471 Supraventricular tachycardia: Secondary | ICD-10-CM | POA: Diagnosis not present

## 2021-03-08 DIAGNOSIS — I5022 Chronic systolic (congestive) heart failure: Secondary | ICD-10-CM | POA: Diagnosis not present

## 2021-03-08 DIAGNOSIS — I110 Hypertensive heart disease with heart failure: Secondary | ICD-10-CM | POA: Diagnosis not present

## 2021-03-08 DIAGNOSIS — I712 Thoracic aortic aneurysm, without rupture: Secondary | ICD-10-CM | POA: Diagnosis not present

## 2021-03-08 DIAGNOSIS — M810 Age-related osteoporosis without current pathological fracture: Secondary | ICD-10-CM | POA: Diagnosis not present

## 2021-03-08 DIAGNOSIS — F3342 Major depressive disorder, recurrent, in full remission: Secondary | ICD-10-CM | POA: Diagnosis not present

## 2021-03-08 DIAGNOSIS — Z9889 Other specified postprocedural states: Secondary | ICD-10-CM | POA: Diagnosis not present

## 2021-03-08 DIAGNOSIS — Z952 Presence of prosthetic heart valve: Secondary | ICD-10-CM | POA: Diagnosis not present

## 2021-03-08 DIAGNOSIS — I700 Atherosclerosis of aorta: Secondary | ICD-10-CM | POA: Diagnosis not present

## 2021-03-22 DIAGNOSIS — I5022 Chronic systolic (congestive) heart failure: Secondary | ICD-10-CM | POA: Diagnosis not present

## 2021-03-22 DIAGNOSIS — M810 Age-related osteoporosis without current pathological fracture: Secondary | ICD-10-CM | POA: Diagnosis not present

## 2021-03-22 DIAGNOSIS — M159 Polyosteoarthritis, unspecified: Secondary | ICD-10-CM | POA: Diagnosis not present

## 2021-03-22 DIAGNOSIS — I110 Hypertensive heart disease with heart failure: Secondary | ICD-10-CM | POA: Diagnosis not present

## 2021-03-22 DIAGNOSIS — J449 Chronic obstructive pulmonary disease, unspecified: Secondary | ICD-10-CM | POA: Diagnosis not present

## 2021-03-22 DIAGNOSIS — M549 Dorsalgia, unspecified: Secondary | ICD-10-CM | POA: Diagnosis not present

## 2021-03-22 DIAGNOSIS — I700 Atherosclerosis of aorta: Secondary | ICD-10-CM | POA: Diagnosis not present

## 2021-03-22 DIAGNOSIS — G8929 Other chronic pain: Secondary | ICD-10-CM | POA: Diagnosis not present

## 2021-03-22 DIAGNOSIS — K52832 Lymphocytic colitis: Secondary | ICD-10-CM | POA: Diagnosis not present

## 2021-03-26 DIAGNOSIS — I110 Hypertensive heart disease with heart failure: Secondary | ICD-10-CM | POA: Diagnosis not present

## 2021-03-26 DIAGNOSIS — M159 Polyosteoarthritis, unspecified: Secondary | ICD-10-CM | POA: Diagnosis not present

## 2021-03-26 DIAGNOSIS — I5022 Chronic systolic (congestive) heart failure: Secondary | ICD-10-CM | POA: Diagnosis not present

## 2021-03-26 DIAGNOSIS — M549 Dorsalgia, unspecified: Secondary | ICD-10-CM | POA: Diagnosis not present

## 2021-03-26 DIAGNOSIS — M810 Age-related osteoporosis without current pathological fracture: Secondary | ICD-10-CM | POA: Diagnosis not present

## 2021-03-26 DIAGNOSIS — J449 Chronic obstructive pulmonary disease, unspecified: Secondary | ICD-10-CM | POA: Diagnosis not present

## 2021-03-26 DIAGNOSIS — I700 Atherosclerosis of aorta: Secondary | ICD-10-CM | POA: Diagnosis not present

## 2021-03-26 DIAGNOSIS — K52832 Lymphocytic colitis: Secondary | ICD-10-CM | POA: Diagnosis not present

## 2021-03-26 DIAGNOSIS — G8929 Other chronic pain: Secondary | ICD-10-CM | POA: Diagnosis not present

## 2021-03-29 DIAGNOSIS — I110 Hypertensive heart disease with heart failure: Secondary | ICD-10-CM | POA: Diagnosis not present

## 2021-03-29 DIAGNOSIS — I700 Atherosclerosis of aorta: Secondary | ICD-10-CM | POA: Diagnosis not present

## 2021-03-29 DIAGNOSIS — M549 Dorsalgia, unspecified: Secondary | ICD-10-CM | POA: Diagnosis not present

## 2021-03-29 DIAGNOSIS — J449 Chronic obstructive pulmonary disease, unspecified: Secondary | ICD-10-CM | POA: Diagnosis not present

## 2021-03-29 DIAGNOSIS — M159 Polyosteoarthritis, unspecified: Secondary | ICD-10-CM | POA: Diagnosis not present

## 2021-03-29 DIAGNOSIS — M810 Age-related osteoporosis without current pathological fracture: Secondary | ICD-10-CM | POA: Diagnosis not present

## 2021-03-29 DIAGNOSIS — K52832 Lymphocytic colitis: Secondary | ICD-10-CM | POA: Diagnosis not present

## 2021-03-29 DIAGNOSIS — I5022 Chronic systolic (congestive) heart failure: Secondary | ICD-10-CM | POA: Diagnosis not present

## 2021-03-29 DIAGNOSIS — G8929 Other chronic pain: Secondary | ICD-10-CM | POA: Diagnosis not present

## 2021-03-31 DIAGNOSIS — M159 Polyosteoarthritis, unspecified: Secondary | ICD-10-CM | POA: Diagnosis not present

## 2021-03-31 DIAGNOSIS — K52832 Lymphocytic colitis: Secondary | ICD-10-CM | POA: Diagnosis not present

## 2021-03-31 DIAGNOSIS — M810 Age-related osteoporosis without current pathological fracture: Secondary | ICD-10-CM | POA: Diagnosis not present

## 2021-03-31 DIAGNOSIS — I110 Hypertensive heart disease with heart failure: Secondary | ICD-10-CM | POA: Diagnosis not present

## 2021-03-31 DIAGNOSIS — I700 Atherosclerosis of aorta: Secondary | ICD-10-CM | POA: Diagnosis not present

## 2021-03-31 DIAGNOSIS — G8929 Other chronic pain: Secondary | ICD-10-CM | POA: Diagnosis not present

## 2021-03-31 DIAGNOSIS — J449 Chronic obstructive pulmonary disease, unspecified: Secondary | ICD-10-CM | POA: Diagnosis not present

## 2021-03-31 DIAGNOSIS — I5022 Chronic systolic (congestive) heart failure: Secondary | ICD-10-CM | POA: Diagnosis not present

## 2021-03-31 DIAGNOSIS — M549 Dorsalgia, unspecified: Secondary | ICD-10-CM | POA: Diagnosis not present

## 2021-04-02 DIAGNOSIS — I110 Hypertensive heart disease with heart failure: Secondary | ICD-10-CM | POA: Diagnosis not present

## 2021-04-02 DIAGNOSIS — M810 Age-related osteoporosis without current pathological fracture: Secondary | ICD-10-CM | POA: Diagnosis not present

## 2021-04-02 DIAGNOSIS — M159 Polyosteoarthritis, unspecified: Secondary | ICD-10-CM | POA: Diagnosis not present

## 2021-04-02 DIAGNOSIS — I5022 Chronic systolic (congestive) heart failure: Secondary | ICD-10-CM | POA: Diagnosis not present

## 2021-04-02 DIAGNOSIS — K52832 Lymphocytic colitis: Secondary | ICD-10-CM | POA: Diagnosis not present

## 2021-04-02 DIAGNOSIS — M549 Dorsalgia, unspecified: Secondary | ICD-10-CM | POA: Diagnosis not present

## 2021-04-02 DIAGNOSIS — G8929 Other chronic pain: Secondary | ICD-10-CM | POA: Diagnosis not present

## 2021-04-05 DIAGNOSIS — M159 Polyosteoarthritis, unspecified: Secondary | ICD-10-CM | POA: Diagnosis not present

## 2021-04-05 DIAGNOSIS — J449 Chronic obstructive pulmonary disease, unspecified: Secondary | ICD-10-CM | POA: Diagnosis not present

## 2021-04-05 DIAGNOSIS — K52832 Lymphocytic colitis: Secondary | ICD-10-CM | POA: Diagnosis not present

## 2021-04-05 DIAGNOSIS — M549 Dorsalgia, unspecified: Secondary | ICD-10-CM | POA: Diagnosis not present

## 2021-04-05 DIAGNOSIS — M810 Age-related osteoporosis without current pathological fracture: Secondary | ICD-10-CM | POA: Diagnosis not present

## 2021-04-05 DIAGNOSIS — I5022 Chronic systolic (congestive) heart failure: Secondary | ICD-10-CM | POA: Diagnosis not present

## 2021-04-05 DIAGNOSIS — G8929 Other chronic pain: Secondary | ICD-10-CM | POA: Diagnosis not present

## 2021-04-05 DIAGNOSIS — I700 Atherosclerosis of aorta: Secondary | ICD-10-CM | POA: Diagnosis not present

## 2021-04-05 DIAGNOSIS — I110 Hypertensive heart disease with heart failure: Secondary | ICD-10-CM | POA: Diagnosis not present

## 2021-04-08 DIAGNOSIS — M159 Polyosteoarthritis, unspecified: Secondary | ICD-10-CM | POA: Diagnosis not present

## 2021-04-08 DIAGNOSIS — J449 Chronic obstructive pulmonary disease, unspecified: Secondary | ICD-10-CM | POA: Diagnosis not present

## 2021-04-08 DIAGNOSIS — G8929 Other chronic pain: Secondary | ICD-10-CM | POA: Diagnosis not present

## 2021-04-08 DIAGNOSIS — I5022 Chronic systolic (congestive) heart failure: Secondary | ICD-10-CM | POA: Diagnosis not present

## 2021-04-08 DIAGNOSIS — I110 Hypertensive heart disease with heart failure: Secondary | ICD-10-CM | POA: Diagnosis not present

## 2021-04-08 DIAGNOSIS — K52832 Lymphocytic colitis: Secondary | ICD-10-CM | POA: Diagnosis not present

## 2021-04-08 DIAGNOSIS — I700 Atherosclerosis of aorta: Secondary | ICD-10-CM | POA: Diagnosis not present

## 2021-04-08 DIAGNOSIS — M549 Dorsalgia, unspecified: Secondary | ICD-10-CM | POA: Diagnosis not present

## 2021-04-08 DIAGNOSIS — M810 Age-related osteoporosis without current pathological fracture: Secondary | ICD-10-CM | POA: Diagnosis not present

## 2021-04-13 DIAGNOSIS — M159 Polyosteoarthritis, unspecified: Secondary | ICD-10-CM | POA: Diagnosis not present

## 2021-04-13 DIAGNOSIS — I700 Atherosclerosis of aorta: Secondary | ICD-10-CM | POA: Diagnosis not present

## 2021-04-13 DIAGNOSIS — J449 Chronic obstructive pulmonary disease, unspecified: Secondary | ICD-10-CM | POA: Diagnosis not present

## 2021-04-13 DIAGNOSIS — M810 Age-related osteoporosis without current pathological fracture: Secondary | ICD-10-CM | POA: Diagnosis not present

## 2021-04-13 DIAGNOSIS — I110 Hypertensive heart disease with heart failure: Secondary | ICD-10-CM | POA: Diagnosis not present

## 2021-04-13 DIAGNOSIS — I5022 Chronic systolic (congestive) heart failure: Secondary | ICD-10-CM | POA: Diagnosis not present

## 2021-04-13 DIAGNOSIS — K52832 Lymphocytic colitis: Secondary | ICD-10-CM | POA: Diagnosis not present

## 2021-04-13 DIAGNOSIS — M549 Dorsalgia, unspecified: Secondary | ICD-10-CM | POA: Diagnosis not present

## 2021-04-13 DIAGNOSIS — G8929 Other chronic pain: Secondary | ICD-10-CM | POA: Diagnosis not present

## 2021-04-22 DIAGNOSIS — M549 Dorsalgia, unspecified: Secondary | ICD-10-CM | POA: Diagnosis not present

## 2021-04-22 DIAGNOSIS — J449 Chronic obstructive pulmonary disease, unspecified: Secondary | ICD-10-CM | POA: Diagnosis not present

## 2021-04-22 DIAGNOSIS — K52832 Lymphocytic colitis: Secondary | ICD-10-CM | POA: Diagnosis not present

## 2021-04-22 DIAGNOSIS — M810 Age-related osteoporosis without current pathological fracture: Secondary | ICD-10-CM | POA: Diagnosis not present

## 2021-04-22 DIAGNOSIS — M159 Polyosteoarthritis, unspecified: Secondary | ICD-10-CM | POA: Diagnosis not present

## 2021-04-22 DIAGNOSIS — G8929 Other chronic pain: Secondary | ICD-10-CM | POA: Diagnosis not present

## 2021-04-22 DIAGNOSIS — I5022 Chronic systolic (congestive) heart failure: Secondary | ICD-10-CM | POA: Diagnosis not present

## 2021-04-22 DIAGNOSIS — I700 Atherosclerosis of aorta: Secondary | ICD-10-CM | POA: Diagnosis not present

## 2021-04-22 DIAGNOSIS — I110 Hypertensive heart disease with heart failure: Secondary | ICD-10-CM | POA: Diagnosis not present

## 2021-04-27 DIAGNOSIS — K52832 Lymphocytic colitis: Secondary | ICD-10-CM | POA: Diagnosis not present

## 2021-04-27 DIAGNOSIS — I110 Hypertensive heart disease with heart failure: Secondary | ICD-10-CM | POA: Diagnosis not present

## 2021-04-27 DIAGNOSIS — J449 Chronic obstructive pulmonary disease, unspecified: Secondary | ICD-10-CM | POA: Diagnosis not present

## 2021-04-27 DIAGNOSIS — I700 Atherosclerosis of aorta: Secondary | ICD-10-CM | POA: Diagnosis not present

## 2021-04-27 DIAGNOSIS — G8929 Other chronic pain: Secondary | ICD-10-CM | POA: Diagnosis not present

## 2021-04-27 DIAGNOSIS — M810 Age-related osteoporosis without current pathological fracture: Secondary | ICD-10-CM | POA: Diagnosis not present

## 2021-04-27 DIAGNOSIS — M549 Dorsalgia, unspecified: Secondary | ICD-10-CM | POA: Diagnosis not present

## 2021-04-27 DIAGNOSIS — I5022 Chronic systolic (congestive) heart failure: Secondary | ICD-10-CM | POA: Diagnosis not present

## 2021-04-27 DIAGNOSIS — M159 Polyosteoarthritis, unspecified: Secondary | ICD-10-CM | POA: Diagnosis not present

## 2021-05-04 DIAGNOSIS — M549 Dorsalgia, unspecified: Secondary | ICD-10-CM | POA: Diagnosis not present

## 2021-05-04 DIAGNOSIS — K52832 Lymphocytic colitis: Secondary | ICD-10-CM | POA: Diagnosis not present

## 2021-05-04 DIAGNOSIS — I110 Hypertensive heart disease with heart failure: Secondary | ICD-10-CM | POA: Diagnosis not present

## 2021-05-04 DIAGNOSIS — I5022 Chronic systolic (congestive) heart failure: Secondary | ICD-10-CM | POA: Diagnosis not present

## 2021-05-04 DIAGNOSIS — G8929 Other chronic pain: Secondary | ICD-10-CM | POA: Diagnosis not present

## 2021-05-04 DIAGNOSIS — J449 Chronic obstructive pulmonary disease, unspecified: Secondary | ICD-10-CM | POA: Diagnosis not present

## 2021-05-04 DIAGNOSIS — M159 Polyosteoarthritis, unspecified: Secondary | ICD-10-CM | POA: Diagnosis not present

## 2021-05-04 DIAGNOSIS — M810 Age-related osteoporosis without current pathological fracture: Secondary | ICD-10-CM | POA: Diagnosis not present

## 2021-05-04 DIAGNOSIS — I700 Atherosclerosis of aorta: Secondary | ICD-10-CM | POA: Diagnosis not present

## 2021-05-24 DIAGNOSIS — Z48812 Encounter for surgical aftercare following surgery on the circulatory system: Secondary | ICD-10-CM | POA: Diagnosis not present

## 2021-05-24 DIAGNOSIS — F17210 Nicotine dependence, cigarettes, uncomplicated: Secondary | ICD-10-CM | POA: Diagnosis not present

## 2021-05-24 DIAGNOSIS — Z9889 Other specified postprocedural states: Secondary | ICD-10-CM | POA: Diagnosis not present

## 2021-05-24 DIAGNOSIS — I088 Other rheumatic multiple valve diseases: Secondary | ICD-10-CM | POA: Diagnosis not present

## 2021-05-24 DIAGNOSIS — Z952 Presence of prosthetic heart valve: Secondary | ICD-10-CM | POA: Diagnosis not present

## 2021-05-24 DIAGNOSIS — Z8679 Personal history of other diseases of the circulatory system: Secondary | ICD-10-CM | POA: Diagnosis not present

## 2021-05-24 DIAGNOSIS — I7123 Aneurysm of the descending thoracic aorta, without rupture: Secondary | ICD-10-CM | POA: Diagnosis not present

## 2021-06-02 DIAGNOSIS — D696 Thrombocytopenia, unspecified: Secondary | ICD-10-CM | POA: Diagnosis not present

## 2021-06-02 DIAGNOSIS — E785 Hyperlipidemia, unspecified: Secondary | ICD-10-CM | POA: Diagnosis not present

## 2021-06-02 DIAGNOSIS — I10 Essential (primary) hypertension: Secondary | ICD-10-CM | POA: Diagnosis not present

## 2021-06-09 DIAGNOSIS — I712 Thoracic aortic aneurysm, without rupture, unspecified: Secondary | ICD-10-CM | POA: Diagnosis not present

## 2021-06-09 DIAGNOSIS — I471 Supraventricular tachycardia: Secondary | ICD-10-CM | POA: Diagnosis not present

## 2021-06-09 DIAGNOSIS — I700 Atherosclerosis of aorta: Secondary | ICD-10-CM | POA: Diagnosis not present

## 2021-06-09 DIAGNOSIS — M810 Age-related osteoporosis without current pathological fracture: Secondary | ICD-10-CM | POA: Diagnosis not present

## 2021-06-09 DIAGNOSIS — K219 Gastro-esophageal reflux disease without esophagitis: Secondary | ICD-10-CM | POA: Diagnosis not present

## 2021-06-09 DIAGNOSIS — Z23 Encounter for immunization: Secondary | ICD-10-CM | POA: Diagnosis not present

## 2021-06-09 DIAGNOSIS — I5022 Chronic systolic (congestive) heart failure: Secondary | ICD-10-CM | POA: Diagnosis not present

## 2021-06-09 DIAGNOSIS — D696 Thrombocytopenia, unspecified: Secondary | ICD-10-CM | POA: Diagnosis not present

## 2021-06-09 DIAGNOSIS — I110 Hypertensive heart disease with heart failure: Secondary | ICD-10-CM | POA: Diagnosis not present

## 2021-06-18 DIAGNOSIS — H353211 Exudative age-related macular degeneration, right eye, with active choroidal neovascularization: Secondary | ICD-10-CM | POA: Diagnosis not present

## 2021-06-30 DIAGNOSIS — M75101 Unspecified rotator cuff tear or rupture of right shoulder, not specified as traumatic: Secondary | ICD-10-CM | POA: Diagnosis not present

## 2021-06-30 DIAGNOSIS — M19011 Primary osteoarthritis, right shoulder: Secondary | ICD-10-CM | POA: Diagnosis not present

## 2021-06-30 DIAGNOSIS — M25511 Pain in right shoulder: Secondary | ICD-10-CM | POA: Diagnosis not present

## 2021-08-11 ENCOUNTER — Ambulatory Visit: Payer: Medicare HMO | Attending: Orthopedic Surgery

## 2021-08-11 ENCOUNTER — Other Ambulatory Visit: Payer: Self-pay

## 2021-08-11 DIAGNOSIS — R269 Unspecified abnormalities of gait and mobility: Secondary | ICD-10-CM | POA: Diagnosis not present

## 2021-08-11 DIAGNOSIS — M6281 Muscle weakness (generalized): Secondary | ICD-10-CM | POA: Diagnosis not present

## 2021-08-11 DIAGNOSIS — R262 Difficulty in walking, not elsewhere classified: Secondary | ICD-10-CM

## 2021-08-11 DIAGNOSIS — R293 Abnormal posture: Secondary | ICD-10-CM

## 2021-08-11 NOTE — Therapy (Signed)
[facility] [address], [place], [address] Phone: [phone]   Fax:  [phone]  Physical Therapy Evaluation  Patient Details  Name: April Rogers MRN: [id number] Date of Birth: 04/24/1941 Referring Provider (PT): Duanne Guess, [place]   Encounter Date: 08/11/2021   PT End of Session - 08/11/21 1259     Visit Number 1    Number of Visits 21    Date for PT Re-Evaluation 09/15/21    PT Start Time 1105    PT Stop Time 1155    PT Time Calculation (min) 50 min    Activity Tolerance Patient tolerated treatment well    Behavior During Therapy [facility] for tasks assessed/performed             Past Medical History:  Diagnosis Date   Anxiety    Arthritis    Cancer ([place])    skin cancer   Complication of anesthesia    general anesthesia hard on memory after pt goes home   Depression    Fracture of one rib    GERD (gastroesophageal reflux disease)    was on nexium but no longer needs to take this medication   Hypertension    Macular degeneration of both eyes    Osteoporosis     Past Surgical History:  Procedure Laterality Date   ARM SKIN LESION BIOPSY / EXCISION Right    CHOLECYSTECTOMY     COLONOSCOPY WITH PROPOFOL N/A 08/03/2015   Procedure: COLONOSCOPY WITH PROPOFOL;  Surgeon: Hulen Luster, MD;  Location: [facility] ENDOSCOPY;  Service: Gastroenterology;  Laterality: N/A;   INTRAMEDULLARY (IM) NAIL INTERTROCHANTERIC Right 11/26/2015   Procedure: INTRAMEDULLARY (IM) NAIL INTERTROCHANTRIC;  Surgeon: Earnestine Leys, MD;  Location: [facility] ORS;  Service: Orthopedics;  Laterality: Right;   KYPHOPLASTY N/A 08/16/2019   Procedure: L4 KYPHOPLASTY;  Surgeon: Hessie Knows, MD;  Location: [facility] ORS;  Service: Orthopedics;  Laterality: N/A;   KYPHOPLASTY N/A 08/29/2019   Procedure: L2 KYPHOPLASTY;  Surgeon: Hessie Knows, MD;  Location: [facility] ORS;  Service: Orthopedics;  Laterality: N/A;   KYPHOPLASTY N/A 10/10/2019   Procedure:  L3 KYPHOPLASTY;  Surgeon: Hessie Knows, MD;  Location: [facility] ORS;  Service: Orthopedics;  Laterality: N/A;   open heart surgery  2019   valve replacement, aortic arch  aneurysm repair   TONSILLECTOMY     TUBAL LIGATION     WRIST RECONSTRUCTION Right     There were no vitals filed for this visit.    Subjective Assessment - 08/11/21 1108     Subjective Pt is a 81 y.o. female referred to PT for gait and imbalance. PMH includes: Anxiety, skin cancer, GERD, HTN, osteoporosis, L2-L4 kyphoplasties 08/29/19, R hip ORIF.    Pertinent History Pt is a 81 y.o. female referred to PT for gait and imbalance. PMH includes: Anxiety, skin cancer, GERD, HTN, osteoporosis, L2-L4 kyphoplasties 08/29/19, R hip ORIF. Pt reporting for OPPT for gait/imbalance. Was recently receiving OP PT for R shoulder but that has been complete. Pt 's daughter present to assist in history taking. Pt's daughter reports since covid, open heart surgery, recent surgeries causing balance concerns dating from 2019. No falls in last 6 months, 1 fall in the last year. Near falls reported. Pt is a furniture walker, does use rollator in house. Pt endorsing difficulty with vision, obstacle navigation, clearing feet with walking. Home lay out noted in flow sheets. Pt's goal is to improve balance and strength.  Limitations Lifting;Standing;Walking    Patient Stated Goals Improve balance and strength.    Currently in Pain? No/denies                [facility] PT Assessment - 08/11/21 1115       Assessment   Medical Diagnosis gait/imbalance    Referring Provider (PT) Duanne Guess, PA    Prior Therapy Yes   R shoulder pain     Balance Screen   Has the patient fallen in the past 6 months No      [place] residence    Living Arrangements Alone   Daughter there 3-4 days/week 6-8 hours.   Type of [place] entrance   Does have 1 STE but doesn't have to navigate.   Home  Layout One level    [place] - 4 wheels    Additional Comments walk in shower, grab bars, built in seat. Grab bar outside of shower, grab bars with ramped entrance.      Prior Function   Level of Independence Independent with household mobility with device;Independent with community mobility with device      Cognition   Overall Cognitive Status Within Functional Limits for tasks assessed      Sensation   Light Touch Appears Intact      Transfers   Five time sit to stand comments  23.08 sec with UE support   LLE further out in front of pt than RLE     Ambulation/Gait   Gait velocity 0.58 m/s normal speed, 0.83 m/s fast      Standardized Balance Assessment   Standardized Balance Assessment Dynamic Gait Index      Dynamic Gait Index   Level Surface Moderate Impairment    Change in Gait Speed Mild Impairment    Gait with Horizontal Head Turns Moderate Impairment    Gait with Vertical Head Turns Moderate Impairment    Gait and Pivot Turn Mild Impairment    Step Over Obstacle Severe Impairment    Step Around Obstacles Mild Impairment    Steps Mild Impairment    Total Score 11             OBJECTIVE  Posture No gross abnormalities noted in standing or seated posture   Gait No gross abnormalities in gait noted   Strength R/L 4/4 Hip flexion 5/5 Hip abduction 5/5 Hip adduction 5/5 Knee extension 4/4 Knee flexion 4/4 Ankle Plantarflexion 5/5 Ankle Dorsiflexion   NEUROLOGICAL:  Mental Status Patient's fund of knowledge is within normal limits for educational level.   Sensation Grossly intact to light touch bilateral UEs/LEs as determined by testing dermatomes C2-T2/L2-S2 respectively. Proprioception and hot/cold testing deferred on this date    Objective measurements completed on examination: See above findings.     PT Education - 08/11/21 1258     Education Details POC, HEP. Pt's fall risk.    Person(s) Educated Patient;Child(ren)     Methods Explanation;Demonstration    Comprehension Verbalized understanding;Returned demonstration              PT Short Term Goals - 08/11/21 1318       PT SHORT TERM GOAL #1   Title Pt will be independent with HEP to improve posture and balance for ADL completion    Baseline 08/11/21: initiated    Time 10    Period Weeks    Status New    Target Date  10/20/21               PT Long Term Goals - 08/11/21 1319       PT LONG TERM GOAL #1   Title Pt will improve FOTO to target score to demonstrate clinically significant improvement in functional mobility    Baseline 08/11/21: Next session    Time 10    Period Weeks    Status New    Target Date 10/20/21      PT LONG TERM GOAL #2   Title Pt will improve 5xSTS time by 5 seconds without UE support to demonstrate clinically significant improvement in strength.    Baseline 08/11/21: 23.08 sec with UE's on arm rests    Time 10    Period Weeks    Status New    Target Date 10/20/21      PT LONG TERM GOAL #3   Title Pt will improve DGI to 19 to demonstrate clinicially significant improvement in falls reduction with community ambulation tasks.    Baseline 08/11/21: 11/24    Time 10    Period Weeks    Status New    Target Date 10/20/21      PT LONG TERM GOAL #4   Title Pt will improve normal walking speed to > 0.8 m/s to decrease risk of falls with household walking tasks.    Baseline 08/11/21: 0.58 m/s    Time 10    Period Weeks    Status New    Target Date 10/20/21                    Plan - 08/11/21 1300     Clinical Impression Statement Pt is a pleasant 81 y.o. female referred to PT for gait and imbalance. Pt presents with forward head posture, ant trunk lean in standing and with gait. Pt presents with normal lower quarter neuro screen with light touch and strength with weakness in hip abduction and flexion and ankle DF. Pt is at increased risk of falls due to time in 5xSTS, gait speed without AD, and DGI score.  Pt and daughter educated on findings and continued use of rollator for reduced risk of falls. These impairments significantly increase pt's risk of falls and injury with household and community walking tasks. Pt will benefit from skilled PT services to address impairments to reduce pt's risk for falls.    Personal Factors and Comorbidities Age;Time since onset of injury/illness/exacerbation;Comorbidity 3+;Past/Current Experience    Comorbidities PMH inc;ludes: Anxiety, skin cancer, GERD, HTN, osteoporosis, L2-L4 kyphoplasties 08/29/19, R hip ORIF.    Examination-Activity Limitations Stairs;Bend;Locomotion Level;Stand;Reach Overhead;Carry;Transfers;Squat    Examination-Participation Restrictions Shop;Community Activity    Stability/Clinical Decision Making Evolving/Moderate complexity    Clinical Decision Making Moderate    Rehab Potential Fair    PT Frequency 2x / week    PT Duration Other (comment)   10 weeks   PT Treatment/Interventions ADLs/Self Care Home Management;DME Instruction;Gait training;Stair training;Functional mobility training;Therapeutic activities;Therapeutic exercise;Balance training;Neuromuscular re-education;Patient/family education    PT Next Visit Plan FOTO, Berg balance, assess vestibular balance systems with changes in BOS, removal of vision. Set up HEP    PT Home Exercise Plan scap retractions for posture    Consulted and Agree with Plan of Care Patient;Family member/caregiver    Family Member Consulted Daughter             Patient will benefit from skilled therapeutic intervention in order to improve the following deficits and impairments:  Abnormal gait,  Improper body mechanics, Decreased mobility, Postural dysfunction, Decreased activity tolerance, Decreased balance, Decreased strength, Difficulty walking  Visit Diagnosis: Abnormality of gait and mobility  Difficulty in walking, not elsewhere classified  Muscle weakness (generalized)  Abnormal  posture     Problem List Patient Active Problem List   Diagnosis Date Noted   Femur fracture, right, closed, initial encounter 11/25/2015    Salem Caster. Fairly IV, PT, DPT Physical Therapist- [facility]  08/11/2021, 4:21 PM  [facility] [address], [place], [address] Phone: [phone]   Fax:  [phone]  Name: April Rogers MRN: [id number] Date of Birth: [birth year]

## 2021-08-18 ENCOUNTER — Ambulatory Visit: Payer: Medicare HMO

## 2021-08-18 ENCOUNTER — Other Ambulatory Visit: Payer: Self-pay

## 2021-08-18 DIAGNOSIS — R262 Difficulty in walking, not elsewhere classified: Secondary | ICD-10-CM | POA: Diagnosis not present

## 2021-08-18 DIAGNOSIS — R269 Unspecified abnormalities of gait and mobility: Secondary | ICD-10-CM

## 2021-08-18 DIAGNOSIS — M6281 Muscle weakness (generalized): Secondary | ICD-10-CM | POA: Diagnosis not present

## 2021-08-18 DIAGNOSIS — R293 Abnormal posture: Secondary | ICD-10-CM | POA: Diagnosis not present

## 2021-08-18 NOTE — Therapy (Signed)
[facility] [address] [place], [place], [address] Phone: [phone]   Fax:  [phone]  Physical Therapy Treatment  Patient Details  Name: April Rogers MRN: [id number] Date of Birth: [birth year] Referring Provider (PT): Duanne Guess, [place]   Encounter Date: 08/18/2021   PT End of Session - 08/18/21 1523     Visit Number 2    Number of Visits 21    Date for PT Re-Evaluation 09/15/21    PT Start Time 1331    PT Stop Time 1420    PT Time Calculation (min) 49 min    Activity Tolerance Patient tolerated treatment well    Behavior During Therapy [facility] for tasks assessed/performed             Past Medical History:  Diagnosis Date   Anxiety    Arthritis    Cancer ([place])    skin cancer   Complication of anesthesia    general anesthesia hard on memory after pt goes home   Depression    Fracture of one rib    GERD (gastroesophageal reflux disease)    was on nexium but no longer needs to take this medication   Hypertension    Macular degeneration of both eyes    Osteoporosis     Past Surgical History:  Procedure Laterality Date   ARM SKIN LESION BIOPSY / EXCISION Right    CHOLECYSTECTOMY     COLONOSCOPY WITH PROPOFOL N/A 08/03/2015   Procedure: COLONOSCOPY WITH PROPOFOL;  Surgeon: Hulen Luster, MD;  Location: [facility] ENDOSCOPY;  Service: Gastroenterology;  Laterality: N/A;   INTRAMEDULLARY (IM) NAIL INTERTROCHANTERIC Right 11/26/2015   Procedure: INTRAMEDULLARY (IM) NAIL INTERTROCHANTRIC;  Surgeon: Earnestine Leys, MD;  Location: [facility] ORS;  Service: Orthopedics;  Laterality: Right;   KYPHOPLASTY N/A 08/16/2019   Procedure: L4 KYPHOPLASTY;  Surgeon: Hessie Knows, MD;  Location: [facility] ORS;  Service: Orthopedics;  Laterality: N/A;   KYPHOPLASTY N/A 08/29/2019   Procedure: L2 KYPHOPLASTY;  Surgeon: Hessie Knows, MD;  Location: [facility] ORS;  Service: Orthopedics;  Laterality: N/A;   KYPHOPLASTY N/A 10/10/2019   Procedure: L3  KYPHOPLASTY;  Surgeon: Hessie Knows, MD;  Location: [facility] ORS;  Service: Orthopedics;  Laterality: N/A;   open heart surgery  2019   valve replacement, aortic arch  aneurysm repair   TONSILLECTOMY     TUBAL LIGATION     WRIST RECONSTRUCTION Right     There were no vitals filed for this visit.   Subjective Assessment - 08/18/21 1334     Subjective Pt denies falls. No new concerns or complaints. Daughter present. Has been walking with her rollator.    Pertinent History Pt is a 81 y.o. female referred to PT for gait and imbalance. PMH includes: Anxiety, skin cancer, GERD, HTN, osteoporosis, L2-L4 kyphoplasties 08/29/19, R hip ORIF. Pt reporting for OPPT for gait/imbalance. Was recently receiving OP PT for R shoulder but that has been complete. Pt 's daughter present to assist in history taking. Pt's daughter reports since covid, open heart surgery, recent surgeries causing balance concerns dating from 2019. No falls in last 6 months, 1 fall in the last year. Near falls reported. Pt is a furniture walker, does use rollator in house. Pt endorsing difficulty with vision, obstacle navigation, clearing feet with walking. Home lay out noted in flow sheets. Pt's goal is to improve balance and strength.    Limitations Lifting;Standing;Walking    Patient Stated Goals Improve balance and strength.  Currently in Pain? No/denies                [facility] PT Assessment - 08/18/21 0001       Standardized Balance Assessment   Standardized Balance Assessment Berg Balance Test      Berg Balance Test   Sit to Stand Able to stand  independently using hands    Standing Unsupported Able to stand 2 minutes with supervision    Sitting with Back Unsupported but Feet Supported on Floor or Stool Able to sit safely and securely 2 minutes    Stand to Sit Controls descent by using hands    Transfers Able to transfer safely, definite need of hands    Standing Unsupported with Eyes Closed Able to stand 10 seconds  with supervision    Standing Unsupported with Feet Together Able to place feet together independently and stand for 1 minute with supervision    From Standing, Reach Forward with Outstretched Arm Can reach confidently >25 cm (10")    From Standing Position, Pick up Object from Floor Able to pick up shoe, needs supervision    From Standing Position, Turn to Look Behind Over each Shoulder Looks behind one side only/other side shows less weight shift    Turn 360 Degrees Able to turn 360 degrees safely but slowly    Standing Unsupported, Alternately Place Feet on Step/Stool Able to complete 4 steps without aid or supervision    Standing Unsupported, One Foot in Front Able to take small step independently and hold 30 seconds    Standing on One Leg Able to lift leg independently and hold equal to or more than 3 seconds    Total Score 40               Treatment:   FOTO: 43 with target of 54.   Performance of Berg: 40/56 as above. Increased risk of falls.   Initiated HEP Pt and daughter educated on program below.   Access Code: [email or web address] URL: [email or web address] Date: 08/18/2021 Prepared by: Larna Daughters  Exercises Sit to Stand - 1 x daily - 3 x weekly - 2 sets - 8 reps Single Leg Stance with Support - 1 x daily - 7 x weekly - 2 sets - 2 reps - 15-20 hold Semi-Tandem Corner Balance With Eyes Open - 1 x daily - 7 x weekly - 2 sets - 2 reps - 15-20 hold    PT Education - 08/18/21 1504     Education Details form/technique with exercises.    Person(s) Educated Patient;Child(ren)    Methods Explanation;Tactile cues;Demonstration;Verbal cues;Handout    Comprehension Verbalized understanding;Returned demonstration              PT Short Term Goals - 08/11/21 1318       PT SHORT TERM GOAL #1   Title Pt will be independent with HEP to improve posture and balance for ADL completion    Baseline 08/11/21: initiated    Time 10    Period Weeks    Status New    Target  Date 10/20/21               PT Long Term Goals - 08/18/21 1527       PT LONG TERM GOAL #1   Title Pt will improve FOTO to target score of 54 to demonstrate clinically significant improvement in functional mobility    Baseline 08/11/21: 43    Time 10    Period Weeks    Status  New    Target Date 10/20/21      PT LONG TERM GOAL #2   Title Pt will improve 5xSTS time by 5 seconds without UE support to demonstrate clinically significant improvement in strength.    Baseline 08/11/21: 23.08 sec with UE's on arm rests    Time 10    Period Weeks    Status New    Target Date 10/20/21      PT LONG TERM GOAL #3   Title Pt will improve DGI to 19 to demonstrate clinicially significant improvement in falls reduction with community ambulation tasks.    Baseline 08/11/21: 11/24    Time 10    Period Weeks    Status New    Target Date 10/20/21      PT LONG TERM GOAL #4   Title Pt will improve normal walking speed to > 0.8 m/s to decrease risk of falls with household walking tasks.    Baseline 08/11/21: 0.58 m/s    Time 10    Period Weeks    Status New    Target Date 10/20/21      PT LONG TERM GOAL #5   Title Pt will improve Berg balance by 8 points to display clinically significant improvement in reduced fall risk with ADL's    Baseline 08/18/21: 40/56    Time 10    Period Weeks    Status New    Target Date 10/20/21                   Plan - 08/18/21 1524     Clinical Impression Statement Follow up session with finishing objective findings from eval. Pt completed FOTO and Berg balance. Pt scoring 40/56 on Berg placing pt at increased risk of falls. Development for strength and balance exercises given for HEP. Pt's daughter educated ons afe ways to perform with pt with pt and daughter verbalizing understanding. Pt will continue to benefit from skilled PT services to reduce risk of falls.    Personal Factors and Comorbidities Age;Time since onset of  injury/illness/exacerbation;Comorbidity 3+;Past/Current Experience    Comorbidities PMH inc;ludes: Anxiety, skin cancer, GERD, HTN, osteoporosis, L2-L4 kyphoplasties 08/29/19, R hip ORIF.    Examination-Activity Limitations Stairs;Bend;Locomotion Level;Stand;Reach Overhead;Carry;Transfers;Squat    Examination-Participation Restrictions Shop;Community Activity    Stability/Clinical Decision Making Evolving/Moderate complexity    Rehab Potential Fair    PT Frequency 2x / week    PT Duration Other (comment)   10 weeks   PT Treatment/Interventions ADLs/Self Care Home Management;DME Instruction;Gait training;Stair training;Functional mobility training;Therapeutic activities;Therapeutic exercise;Balance training;Neuromuscular re-education;Patient/family education    PT Next Visit Plan Reassess HEP    PT Home Exercise Plan Access Code [email or web address] with scap retractions for posture    Consulted and Agree with Plan of Care Patient;Family member/caregiver    Family Member Consulted Daughter             Patient will benefit from skilled therapeutic intervention in order to improve the following deficits and impairments:  Abnormal gait, Improper body mechanics, Decreased mobility, Postural dysfunction, Decreased activity tolerance, Decreased balance, Decreased strength, Difficulty walking  Visit Diagnosis: Abnormality of gait and mobility  Difficulty in walking, not elsewhere classified  Muscle weakness (generalized)  Abnormal posture     Problem List Patient Active Problem List   Diagnosis Date Noted   Femur fracture, right, closed, initial encounter 11/25/2015    Salem Caster. Fairly IV, PT, DPT Physical Therapist- [facility]  08/18/2021, 3:31 PM  [facility] [address] [place], [address] Phone: [phone]   Fax:  [phone]  Name: April Rogers MRN: [id number] Date of Birth:  1940-07-22

## 2021-08-24 ENCOUNTER — Ambulatory Visit: Payer: Medicare HMO

## 2021-08-24 ENCOUNTER — Other Ambulatory Visit: Payer: Self-pay

## 2021-08-24 DIAGNOSIS — M6281 Muscle weakness (generalized): Secondary | ICD-10-CM

## 2021-08-24 DIAGNOSIS — R269 Unspecified abnormalities of gait and mobility: Secondary | ICD-10-CM | POA: Diagnosis not present

## 2021-08-24 DIAGNOSIS — R262 Difficulty in walking, not elsewhere classified: Secondary | ICD-10-CM

## 2021-08-24 DIAGNOSIS — R293 Abnormal posture: Secondary | ICD-10-CM | POA: Diagnosis not present

## 2021-08-24 NOTE — Therapy (Signed)
[facility] [address] [place], [address] Phone: [phone]   Fax:  [phone]  Physical Therapy Treatment  Patient Details  Name: April Rogers MRN: [id number] Date of Birth: 11-15-1940 Referring Provider (PT): Duanne Guess, [place]   Encounter Date: 08/24/2021   PT End of Session - 08/24/21 1633     Visit Number 3    Number of Visits 21    Date for PT Re-Evaluation 09/15/21    PT Start Time 1633    PT Stop Time 1711    PT Time Calculation (min) 38 min    Equipment Utilized During Treatment Gait belt    Activity Tolerance Patient tolerated treatment well    Behavior During Therapy WFL for tasks assessed/performed             Past Medical History:  Diagnosis Date   Anxiety    Arthritis    Cancer ([place])    skin cancer   Complication of anesthesia    general anesthesia hard on memory after pt goes home   Depression    Fracture of one rib    GERD (gastroesophageal reflux disease)    was on nexium but no longer needs to take this medication   Hypertension    Macular degeneration of both eyes    Osteoporosis     Past Surgical History:  Procedure Laterality Date   ARM SKIN LESION BIOPSY / EXCISION Right    CHOLECYSTECTOMY     COLONOSCOPY WITH PROPOFOL N/A 08/03/2015   Procedure: COLONOSCOPY WITH PROPOFOL;  Surgeon: Hulen Luster, MD;  [facility];  Service: Gastroenterology;  Laterality: N/A;   INTRAMEDULLARY (IM) NAIL INTERTROCHANTERIC Right 11/26/2015   Procedure: INTRAMEDULLARY (IM) NAIL INTERTROCHANTRIC;  Surgeon: Earnestine Leys, MD;  Location: [facility] ORS;  Service: Orthopedics;  Laterality: Right;   KYPHOPLASTY N/A 08/16/2019   Procedure: L4 KYPHOPLASTY;  Surgeon: Hessie Knows, MD;  Location: [facility] ORS;  Service: Orthopedics;  Laterality: N/A;   KYPHOPLASTY N/A 08/29/2019   Procedure: L2 KYPHOPLASTY;  Surgeon: Hessie Knows, MD;  Location: [facility] ORS;  Service: Orthopedics;   Laterality: N/A;   KYPHOPLASTY N/A 10/10/2019   Procedure: L3 KYPHOPLASTY;  Surgeon: Hessie Knows, MD;  Location: [facility] ORS;  Service: Orthopedics;  Laterality: N/A;   open heart surgery  2019   valve replacement, aortic arch  aneurysm repair   TONSILLECTOMY     TUBAL LIGATION     WRIST RECONSTRUCTION Right     There were no vitals filed for this visit.   Subjective Assessment - 08/24/21 1636     Subjective Has been using her rollator for at least 6 months. Pt also states having back issues.    Pertinent History Pt is a 81 y.o. female referred to PT for gait and imbalance. PMH includes: Anxiety, skin cancer, GERD, HTN, osteoporosis, L2-L4 kyphoplasties 08/29/19, R hip ORIF. Pt reporting for OPPT for gait/imbalance. Was recently receiving OP PT for R shoulder but that has been complete. Pt 's daughter present to assist in history taking. Pt's daughter reports since covid, open heart surgery, recent surgeries causing balance concerns dating from 2019. No falls in last 6 months, 1 fall in the last year. Near falls reported. Pt is a furniture walker, does use rollator in house. Pt endorsing difficulty with vision, obstacle navigation, clearing feet with walking. Home lay out noted in flow sheets. Pt's goal is to improve balance and strength.    Limitations Lifting;Standing;Walking  Patient Stated Goals Improve balance and strength.    Currently in Pain? Other (Comment)   No pain level provided                                       PT Education - 08/24/21 1709     Education Details ther-ex    Person(s) Educated Patient    Methods Explanation;Demonstration;Tactile cues;Verbal cues    Comprehension Returned demonstration;Verbalized understanding              Blood pressure is controlled per pt No latex band alleriges   Therapeutic exercise Pt observed using rollator.    Hooklying transversus abdominis contraction 10x5 seconds   Hooklying clamshell  red band 10x3  Difficulty with supine position.   Standing B UE assist   Hip abduction    R 10x3   L 10x3   Marches 10x2 each LE alternating   Side stepping with B UE assist at table 5 ft to the L and 5 ft to the R 5x  Standing mini squats 10x with B UE assist     Improved exercise technique, movement at target joints, use of target muscles after mod verbal, visual, tactile cues.    Response to treatment Pt tolerated session well without aggravation of symptoms.    Clinical impression Worked on trunk, glute, and quadriceps strengthening to promote ability to support herself with less difficulty during stance phase of gait and obstacle negotiation. Good muscle use observed. Pt tolerated session well without aggravation of symptoms. Pt will benefit from continued skilled physical therapy services to improve strength, balance, function, decrease fall risk and ability to ambulate with less difficulty.        PT Short Term Goals - 08/11/21 1318       PT SHORT TERM GOAL #1   Title Pt will be independent with HEP to improve posture and balance for ADL completion    Baseline 08/11/21: initiated    Time 10    Period Weeks    Status New    Target Date 10/20/21               PT Long Term Goals - 08/18/21 1527       PT LONG TERM GOAL #1   Title Pt will improve FOTO to target score of 54 to demonstrate clinically significant improvement in functional mobility    Baseline 08/11/21: 43    Time 10    Period Weeks    Status New    Target Date 10/20/21      PT LONG TERM GOAL #2   Title Pt will improve 5xSTS time by 5 seconds without UE support to demonstrate clinically significant improvement in strength.    Baseline 08/11/21: 23.08 sec with UE's on arm rests    Time 10    Period Weeks    Status New    Target Date 10/20/21      PT LONG TERM GOAL #3   Title Pt will improve DGI to 19 to demonstrate clinicially significant improvement in falls reduction with community  ambulation tasks.    Baseline 08/11/21: 11/24    Time 10    Period Weeks    Status New    Target Date 10/20/21      PT LONG TERM GOAL #4   Title Pt will improve normal walking speed to > 0.8 m/s to decrease risk of falls  with household walking tasks.    Baseline 08/11/21: 0.58 m/s    Time 10    Period Weeks    Status New    Target Date 10/20/21      PT LONG TERM GOAL #5   Title Pt will improve Berg balance by 8 points to display clinically significant improvement in reduced fall risk with ADL's    Baseline 08/18/21: 40/56    Time 10    Period Weeks    Status New    Target Date 10/20/21                   Plan - 08/24/21 1709     Clinical Impression Statement Worked on trunk, glute, and quadriceps strengthening to promote ability to support herself with less difficulty during stance phase of gait and obstacle negotiation. Good muscle use observed. Pt tolerated session well without aggravation of symptoms. Pt will benefit from continued skilled physical therapy services to improve strength, balance, function, decrease fall risk and ability to ambulate with less difficulty.    Personal Factors and Comorbidities Age;Time since onset of injury/illness/exacerbation;Comorbidity 3+;Past/Current Experience    Comorbidities PMH inc;ludes: Anxiety, skin cancer, GERD, HTN, osteoporosis, L2-L4 kyphoplasties 08/29/19, R hip ORIF.    Examination-Activity Limitations Stairs;Bend;Locomotion Level;Stand;Reach Overhead;Carry;Transfers;Squat    Examination-Participation Restrictions Shop;Community Activity    Stability/Clinical Decision Making Evolving/Moderate complexity    Clinical Decision Making Low    Rehab Potential Fair    PT Frequency 2x / week    PT Duration Other (comment)   10 weeks   PT Treatment/Interventions ADLs/Self Care Home Management;DME Instruction;Gait training;Stair training;Functional mobility training;Therapeutic activities;Therapeutic exercise;Balance training;Neuromuscular  re-education;Patient/family education    PT Next Visit Plan Reassess HEP    PT Home Exercise Plan Access Code [email or web address] with scap retractions for posture    Consulted and Agree with Plan of Care Patient;Family member/caregiver    Family Member Consulted Daughter             Patient will benefit from skilled therapeutic intervention in order to improve the following deficits and impairments:  Abnormal gait, Improper body mechanics, Decreased mobility, Postural dysfunction, Decreased activity tolerance, Decreased balance, Decreased strength, Difficulty walking  Visit Diagnosis: Abnormality of gait and mobility  Difficulty in walking, not elsewhere classified  Muscle weakness (generalized)     Problem List Patient Active Problem List   Diagnosis Date Noted   Femur fracture, right, closed, initial encounter 11/25/2015   Joneen Boers PT, DPT  08/24/2021, 6:27 PM  [facility] [address], [place], [address] Phone: [phone]   Fax:  [phone]  Name: ITSEL OPFER MRN: [id number] Date of Birth: 09-01-1940

## 2021-08-26 ENCOUNTER — Ambulatory Visit: Payer: Medicare HMO

## 2021-08-26 ENCOUNTER — Other Ambulatory Visit: Payer: Self-pay

## 2021-08-26 DIAGNOSIS — R262 Difficulty in walking, not elsewhere classified: Secondary | ICD-10-CM

## 2021-08-26 DIAGNOSIS — R293 Abnormal posture: Secondary | ICD-10-CM

## 2021-08-26 DIAGNOSIS — R269 Unspecified abnormalities of gait and mobility: Secondary | ICD-10-CM

## 2021-08-26 DIAGNOSIS — M6281 Muscle weakness (generalized): Secondary | ICD-10-CM | POA: Diagnosis not present

## 2021-08-26 NOTE — Therapy (Signed)
[facility] [address], [place], [address] Phone: [phone]   Fax:  [phone]  Physical Therapy Treatment  Patient Details  Name: April Rogers MRN: [id number] Date of Birth: Jan 07, 1941 Referring Provider (PT): Duanne Guess, [place]   Encounter Date: 08/26/2021   PT End of Session - 08/26/21 1636     Visit Number 4    Number of Visits 21    Date for PT Re-Evaluation 09/15/21    PT Start Time 1636    PT Stop Time 1717    PT Time Calculation (min) 41 min    Equipment Utilized During Treatment Gait belt    Activity Tolerance Patient tolerated treatment well    Behavior During Therapy WFL for tasks assessed/performed             Past Medical History:  Diagnosis Date   Anxiety    Arthritis    Cancer ([place])    skin cancer   Complication of anesthesia    general anesthesia hard on memory after pt goes home   Depression    Fracture of one rib    GERD (gastroesophageal reflux disease)    was on nexium but no longer needs to take this medication   Hypertension    Macular degeneration of both eyes    Osteoporosis     Past Surgical History:  Procedure Laterality Date   ARM SKIN LESION BIOPSY / EXCISION Right    CHOLECYSTECTOMY     COLONOSCOPY WITH PROPOFOL N/A 08/03/2015   Procedure: COLONOSCOPY WITH PROPOFOL;  Surgeon: Hulen Luster, MD;  Location: [facility] ENDOSCOPY;  Service: Gastroenterology;  Laterality: N/A;   INTRAMEDULLARY (IM) NAIL INTERTROCHANTERIC Right 11/26/2015   Procedure: INTRAMEDULLARY (IM) NAIL INTERTROCHANTRIC;  Surgeon: Earnestine Leys, MD;  Location: [facility] ORS;  Service: Orthopedics;  Laterality: Right;   KYPHOPLASTY N/A 08/16/2019   Procedure: L4 KYPHOPLASTY;  Surgeon: Hessie Knows, MD;  Location: [facility] ORS;  Service: Orthopedics;  Laterality: N/A;   KYPHOPLASTY N/A 08/29/2019   Procedure: L2 KYPHOPLASTY;  Surgeon: Hessie Knows, MD;  Location: [facility] ORS;  Service: Orthopedics;   Laterality: N/A;   KYPHOPLASTY N/A 10/10/2019   Procedure: L3 KYPHOPLASTY;  Surgeon: Hessie Knows, MD;  Location: [facility] ORS;  Service: Orthopedics;  Laterality: N/A;   open heart surgery  2019   valve replacement, aortic arch  aneurysm repair   TONSILLECTOMY     TUBAL LIGATION     WRIST RECONSTRUCTION Right     There were no vitals filed for this visit.   Subjective Assessment - 08/26/21 1638     Subjective Had a discomfort R hip. Not bothering her currently. Usually bothers her when she is on her R side in bed.    Pertinent History Pt is a 81 y.o. female referred to PT for gait and imbalance. PMH includes: Anxiety, skin cancer, GERD, HTN, osteoporosis, L2-L4 kyphoplasties 08/29/19, R hip ORIF. Pt reporting for OPPT for gait/imbalance. Was recently receiving OP PT for R shoulder but that has been complete. Pt 's daughter present to assist in history taking. Pt's daughter reports since covid, open heart surgery, recent surgeries causing balance concerns dating from 2019. No falls in last 6 months, 1 fall in the last year. Near falls reported. Pt is a furniture walker, does use rollator in house. Pt endorsing difficulty with vision, obstacle navigation, clearing feet with walking. Home lay out noted in flow sheets. Pt's goal is to improve balance and strength.  Limitations Lifting;Standing;Walking    Patient Stated Goals Improve balance and strength.    Currently in Pain? No/denies                                        PT Education - 08/26/21 1646     Education Details ther-ex, HEP    Person(s) Educated Patient;Caregiver(s)   Daughter/caregiver   Methods Explanation;Demonstration;Tactile cues;Verbal cues;Handout    Comprehension Returned demonstration;Verbalized understanding            Blood pressure is controlled per pt No latex band alleriges     Therapeutic exercise Pt observed using rollator.    CGA with standing exercises  Seated  transversus abdominis contraction 10x5 seconds    Standing B UE assist              Hip abduction                          R 10x3                         L 10x3               Marches 10x3 each LE alternating   Standing glute max squeeze 10x5 seconds with B UE assist, 10x2 with 5 second holds without UE assist    Standing mini squats 10x2 with B UE light touch assist to no UE assist  Side stepping with B UE assist at table 5 ft to the L and 5 ft to the R 5x    Single leg stand with B UE assist   7x5 seconds each LE      Improved exercise technique, movement at target joints, use of target muscles after mod verbal, visual, tactile cues.      Response to treatment Pt tolerated session well without aggravation of symptoms.      Clinical impression Pt able to maintain static standing balance safely without UE assist. Continued working on glute and quad strengthening as well as placing and maintaining her center of gravity between her feet/base of support with standing exercises to improve balance and ability to ambulate with least restrictive AD. Pt tolerated session well without aggravation of symptoms. Pt will benefit from continued skilled physical therapy services to improve strength, balance, function, decrease fall risk and ability to ambulate with less difficulty.         PT Short Term Goals - 08/11/21 1318       PT SHORT TERM GOAL #1   Title Pt will be independent with HEP to improve posture and balance for ADL completion    Baseline 08/11/21: initiated    Time 10    Period Weeks    Status New    Target Date 10/20/21               PT Long Term Goals - 08/18/21 1527       PT LONG TERM GOAL #1   Title Pt will improve FOTO to target score of 54 to demonstrate clinically significant improvement in functional mobility    Baseline 08/11/21: 43    Time 10    Period Weeks    Status New    Target Date 10/20/21      PT LONG TERM GOAL #2   Title Pt will improve  5xSTS time by  5 seconds without UE support to demonstrate clinically significant improvement in strength.    Baseline 08/11/21: 23.08 sec with UE's on arm rests    Time 10    Period Weeks    Status New    Target Date 10/20/21      PT LONG TERM GOAL #3   Title Pt will improve DGI to 19 to demonstrate clinicially significant improvement in falls reduction with community ambulation tasks.    Baseline 08/11/21: 11/24    Time 10    Period Weeks    Status New    Target Date 10/20/21      PT LONG TERM GOAL #4   Title Pt will improve normal walking speed to > 0.8 m/s to decrease risk of falls with household walking tasks.    Baseline 08/11/21: 0.58 m/s    Time 10    Period Weeks    Status New    Target Date 10/20/21      PT LONG TERM GOAL #5   Title Pt will improve Berg balance by 8 points to display clinically significant improvement in reduced fall risk with ADL's    Baseline 08/18/21: 40/56    Time 10    Period Weeks    Status New    Target Date 10/20/21                   Plan - 08/26/21 1647     Clinical Impression Statement Pt able to maintain static standing balance safely without UE assist. Continued working on glute and quad strengthening as well as placing and maintaining her center of gravity between her feet/base of support with standing exercises to improve balance and ability to ambulate with least restrictive AD. Pt tolerated session well without aggravation of symptoms. Pt will benefit from continued skilled physical therapy services to improve strength, balance, function, decrease fall risk and ability to ambulate with less difficulty.    Personal Factors and Comorbidities Age;Time since onset of injury/illness/exacerbation;Comorbidity 3+;Past/Current Experience    Comorbidities PMH inc;ludes: Anxiety, skin cancer, GERD, HTN, osteoporosis, L2-L4 kyphoplasties 08/29/19, R hip ORIF.    Examination-Activity Limitations Stairs;Bend;Locomotion Level;Stand;Reach  Overhead;Carry;Transfers;Squat    Examination-Participation Restrictions Shop;Community Activity    Stability/Clinical Decision Making Evolving/Moderate complexity    Rehab Potential Fair    PT Frequency 2x / week    PT Duration Other (comment)   10 weeks   PT Treatment/Interventions ADLs/Self Care Home Management;DME Instruction;Gait training;Stair training;Functional mobility training;Therapeutic activities;Therapeutic exercise;Balance training;Neuromuscular re-education;Patient/family education    PT Next Visit Plan Reassess HEP    PT Home Exercise Plan Access Code [email or web address] with scap retractions for posture    Consulted and Agree with Plan of Care Patient;Family member/caregiver    Family Member Consulted Daughter             Patient will benefit from skilled therapeutic intervention in order to improve the following deficits and impairments:  Abnormal gait, Improper body mechanics, Decreased mobility, Postural dysfunction, Decreased activity tolerance, Decreased balance, Decreased strength, Difficulty walking  Visit Diagnosis: Abnormality of gait and mobility  Difficulty in walking, not elsewhere classified  Muscle weakness (generalized)  Abnormal posture     Problem List Patient Active Problem List   Diagnosis Date Noted   Femur fracture, right, closed, initial encounter 11/25/2015   Joneen Boers PT, DPT  08/26/2021, 6:21 PM  [facility] [address] [place], [address] Phone: [phone]   Fax:  [phone]  Name: April Rogers MRN: [id number] Date of Birth: [birth year]

## 2021-08-26 NOTE — Patient Instructions (Signed)
Access Code: [email or web address] URL: [email or web address] Date: 08/26/2021 Prepared by: Joneen Boers  Exercises Sit to Stand - 1 x daily - 3 x weekly - 2 sets - 8 reps Single Leg Stance with Support - 1 x daily - 7 x weekly - 2 sets - 2 reps - 15-20 hold Semi-Tandem Corner Balance With Eyes Open - 1 x daily - 7 x weekly - 2 sets - 2 reps - 15-20 hold Standing Hip Abduction with Counter Support - 1 x daily - 7 x weekly - 3 sets - 10 reps

## 2021-09-01 ENCOUNTER — Ambulatory Visit: Payer: Medicare HMO

## 2021-09-01 ENCOUNTER — Other Ambulatory Visit: Payer: Self-pay

## 2021-09-01 DIAGNOSIS — R262 Difficulty in walking, not elsewhere classified: Secondary | ICD-10-CM

## 2021-09-01 DIAGNOSIS — M6281 Muscle weakness (generalized): Secondary | ICD-10-CM

## 2021-09-01 DIAGNOSIS — R293 Abnormal posture: Secondary | ICD-10-CM

## 2021-09-01 DIAGNOSIS — R269 Unspecified abnormalities of gait and mobility: Secondary | ICD-10-CM

## 2021-09-01 NOTE — Therapy (Signed)
[facility] [address], [place], [address] Phone: [phone]   Fax:  [phone]  Physical Therapy Treatment  Patient Details  Name: April Rogers MRN: [id number] Date of Birth: [birth year] Referring Provider (PT): Duanne Guess, [place]   Encounter Date: 09/01/2021   PT End of Session - 09/01/21 1503     Visit Number 5    Number of Visits 21    Date for PT Re-Evaluation 09/15/21    PT Start Time 1503    PT Stop Time 1546    PT Time Calculation (min) 43 min    Equipment Utilized During Treatment Gait belt    Activity Tolerance Patient tolerated treatment well    Behavior During Therapy WFL for tasks assessed/performed             Past Medical History:  Diagnosis Date   Anxiety    Arthritis    Cancer ([place])    skin cancer   Complication of anesthesia    general anesthesia hard on memory after pt goes home   Depression    Fracture of one rib    GERD (gastroesophageal reflux disease)    was on nexium but no longer needs to take this medication   Hypertension    Macular degeneration of both eyes    Osteoporosis     Past Surgical History:  Procedure Laterality Date   ARM SKIN LESION BIOPSY / EXCISION Right    CHOLECYSTECTOMY     COLONOSCOPY WITH PROPOFOL N/A 08/03/2015   Procedure: COLONOSCOPY WITH PROPOFOL;  Surgeon: Hulen Luster, MD;  Location: [facility] ENDOSCOPY;  Service: Gastroenterology;  Laterality: N/A;   INTRAMEDULLARY (IM) NAIL INTERTROCHANTERIC Right 11/26/2015   Procedure: INTRAMEDULLARY (IM) NAIL INTERTROCHANTRIC;  Surgeon: Earnestine Leys, MD;  Location: [facility] ORS;  Service: Orthopedics;  Laterality: Right;   KYPHOPLASTY N/A 08/16/2019   Procedure: L4 KYPHOPLASTY;  Surgeon: Hessie Knows, MD;  Location: [facility] ORS;  Service: Orthopedics;  Laterality: N/A;   KYPHOPLASTY N/A 08/29/2019   Procedure: L2 KYPHOPLASTY;  Surgeon: Hessie Knows, MD;  Location: [facility] ORS;  Service: Orthopedics;   Laterality: N/A;   KYPHOPLASTY N/A 10/10/2019   Procedure: L3 KYPHOPLASTY;  Surgeon: Hessie Knows, MD;  Location: [facility] ORS;  Service: Orthopedics;  Laterality: N/A;   open heart surgery  2019   valve replacement, aortic arch  aneurysm repair   TONSILLECTOMY     TUBAL LIGATION     WRIST RECONSTRUCTION Right     There were no vitals filed for this visit.   Subjective Assessment - 09/01/21 1511     Subjective Doing ok. No pain or discomfort currently. Had a rough time with bowels this morning. No fever.    Pertinent History Pt is a 81 y.o. female referred to PT for gait and imbalance. PMH includes: Anxiety, skin cancer, GERD, HTN, osteoporosis, L2-L4 kyphoplasties 08/29/19, R hip ORIF. Pt reporting for OPPT for gait/imbalance. Was recently receiving OP PT for R shoulder but that has been complete. Pt 's daughter present to assist in history taking. Pt's daughter reports since covid, open heart surgery, recent surgeries causing balance concerns dating from 2019. No falls in last 6 months, 1 fall in the last year. Near falls reported. Pt is a furniture walker, does use rollator in house. Pt endorsing difficulty with vision, obstacle navigation, clearing feet with walking. Home lay out noted in flow sheets. Pt's goal is to improve balance and strength.    Limitations Lifting;Standing;Walking  Patient Stated Goals Improve balance and strength.    Currently in Pain? No/denies                                        PT Education - 09/01/21 1519     Education Details ther-ex    Person(s) Educated Patient    Methods Explanation;Demonstration;Tactile cues;Verbal cues    Comprehension Returned demonstration;Verbalized understanding            Blood pressure is controlled per pt No latex band alleriges     Therapeutic exercise Pt observed using rollator.    CGA with standing exercises   Single leg stand with B UE assist              10x5 seconds each LE for  2 sets   Standing B UE assist              Hip abduction                          R 10x3                         L 10x3    Gait with one treadmill rail assist and contralateral UE assist from PT  (CGA) 5 ft  R rail 3x   L rail 3x   Cues needed to increase step width so as to not trip onto her feet.   Stepping over 2 mini hurdles with one treadmill rail assist and contralateral UE assist from PT   3x, Min A from PT   Sit <> stand from regular chair with arms 5x, emphasis on placing and maintaining her center of gravity over base of support.   Gait 5 ft 2x without rail assist, PT hand held assist (min A)  Side stepping with B UE assist at table 5 ft to the L and 5 ft to the R 4x        Improved exercise technique, movement at target joints, use of target muscles after mod verbal, visual, tactile cues.      Response to treatment Pt tolerated session well without aggravation of symptoms.      Clinical impression Continued working on improving glute strengthening as well as placing and maintaining her center of gravity over her base of support (feet) to promote ability to perform transfers, ambulate, and negotiate obstacles with less difficulty. Pt tolerated session well without aggravation of symptoms. Pt will benefit from continued skilled physical therapy services to improve strength, balance, function, decrease fall risk and ability to ambulate with less difficulty.          PT Short Term Goals - 08/11/21 1318       PT SHORT TERM GOAL #1   Title Pt will be independent with HEP to improve posture and balance for ADL completion    Baseline 08/11/21: initiated    Time 10    Period Weeks    Status New    Target Date 10/20/21               PT Long Term Goals - 08/18/21 1527       PT LONG TERM GOAL #1   Title Pt will improve FOTO to target score of 54 to demonstrate clinically significant improvement in functional mobility    Baseline 08/11/21: 43  Time 10     Period Weeks    Status New    Target Date 10/20/21      PT LONG TERM GOAL #2   Title Pt will improve 5xSTS time by 5 seconds without UE support to demonstrate clinically significant improvement in strength.    Baseline 08/11/21: 23.08 sec with UE's on arm rests    Time 10    Period Weeks    Status New    Target Date 10/20/21      PT LONG TERM GOAL #3   Title Pt will improve DGI to 19 to demonstrate clinicially significant improvement in falls reduction with community ambulation tasks.    Baseline 08/11/21: 11/24    Time 10    Period Weeks    Status New    Target Date 10/20/21      PT LONG TERM GOAL #4   Title Pt will improve normal walking speed to > 0.8 m/s to decrease risk of falls with household walking tasks.    Baseline 08/11/21: 0.58 m/s    Time 10    Period Weeks    Status New    Target Date 10/20/21      PT LONG TERM GOAL #5   Title Pt will improve Berg balance by 8 points to display clinically significant improvement in reduced fall risk with ADL's    Baseline 08/18/21: 40/56    Time 10    Period Weeks    Status New    Target Date 10/20/21                   Plan - 09/01/21 1502     Clinical Impression Statement Continued working on improving glute strengthening as well as placing and maintaining her center of gravity over her base of support (feet) to promote ability to perform transfers, ambulate, and negotiate obstacles with less difficulty. Pt tolerated session well without aggravation of symptoms. Pt will benefit from continued skilled physical therapy services to improve strength, balance, function, decrease fall risk and ability to ambulate with less difficulty.    Personal Factors and Comorbidities Age;Time since onset of injury/illness/exacerbation;Comorbidity 3+;Past/Current Experience    Comorbidities PMH inc;ludes: Anxiety, skin cancer, GERD, HTN, osteoporosis, L2-L4 kyphoplasties 08/29/19, R hip ORIF.    Examination-Activity Limitations  Stairs;Bend;Locomotion Level;Stand;Reach Overhead;Carry;Transfers;Squat    Examination-Participation Restrictions Shop;Community Activity    Stability/Clinical Decision Making Evolving/Moderate complexity    Rehab Potential Fair    PT Frequency 2x / week    PT Duration Other (comment)   10 weeks   PT Treatment/Interventions ADLs/Self Care Home Management;DME Instruction;Gait training;Stair training;Functional mobility training;Therapeutic activities;Therapeutic exercise;Balance training;Neuromuscular re-education;Patient/family education    PT Next Visit Plan Reassess HEP    PT Home Exercise Plan Access Code [email or web address] with scap retractions for posture    Consulted and Agree with Plan of Care Patient;Family member/caregiver    Family Member Consulted Daughter             Patient will benefit from skilled therapeutic intervention in order to improve the following deficits and impairments:  Abnormal gait, Improper body mechanics, Decreased mobility, Postural dysfunction, Decreased activity tolerance, Decreased balance, Decreased strength, Difficulty walking  Visit Diagnosis: Abnormality of gait and mobility  Difficulty in walking, not elsewhere classified  Muscle weakness (generalized)  Abnormal posture     Problem List Patient Active Problem List   Diagnosis Date Noted   Femur fracture, right, closed, initial encounter 11/25/2015   Joneen Boers PT, DPT  09/01/2021, 3:49  PM  [facility] [address] [place], [address] Phone: [phone]   Fax:  [phone]  Name: April Rogers MRN: [id number] Date of Birth: 08/26/1940

## 2021-09-02 ENCOUNTER — Ambulatory Visit: Payer: Medicare HMO

## 2021-09-02 DIAGNOSIS — R269 Unspecified abnormalities of gait and mobility: Secondary | ICD-10-CM | POA: Diagnosis not present

## 2021-09-02 DIAGNOSIS — M6281 Muscle weakness (generalized): Secondary | ICD-10-CM

## 2021-09-02 DIAGNOSIS — R262 Difficulty in walking, not elsewhere classified: Secondary | ICD-10-CM

## 2021-09-02 DIAGNOSIS — R293 Abnormal posture: Secondary | ICD-10-CM | POA: Diagnosis not present

## 2021-09-02 NOTE — Therapy (Signed)
[facility] [address], [place], [address] Phone: [phone]   Fax:  [phone]  Physical Therapy Treatment  Patient Details  Name: April Rogers MRN: [id number] Date of Birth: May 12, 1941 Referring Provider (PT): Duanne Guess, [place]   Encounter Date: 09/02/2021   PT End of Session - 09/02/21 1507     Visit Number 6    Number of Visits 21    Date for PT Re-Evaluation 09/15/21    PT Start Time 4403    PT Stop Time 1550    PT Time Calculation (min) 43 min    Equipment Utilized During Treatment Gait belt    Activity Tolerance Patient tolerated treatment well    Behavior During Therapy WFL for tasks assessed/performed             Past Medical History:  Diagnosis Date   Anxiety    Arthritis    Cancer ([place])    skin cancer   Complication of anesthesia    general anesthesia hard on memory after pt goes home   Depression    Fracture of one rib    GERD (gastroesophageal reflux disease)    was on nexium but no longer needs to take this medication   Hypertension    Macular degeneration of both eyes    Osteoporosis     Past Surgical History:  Procedure Laterality Date   ARM SKIN LESION BIOPSY / EXCISION Right    CHOLECYSTECTOMY     COLONOSCOPY WITH PROPOFOL N/A 08/03/2015   Procedure: COLONOSCOPY WITH PROPOFOL;  Surgeon: Hulen Luster, MD;  Location: [facility] ENDOSCOPY;  Service: Gastroenterology;  Laterality: N/A;   INTRAMEDULLARY (IM) NAIL INTERTROCHANTERIC Right 11/26/2015   Procedure: INTRAMEDULLARY (IM) NAIL INTERTROCHANTRIC;  Surgeon: Earnestine Leys, MD;  Location: [facility] ORS;  Service: Orthopedics;  Laterality: Right;   KYPHOPLASTY N/A 08/16/2019   Procedure: L4 KYPHOPLASTY;  Surgeon: Hessie Knows, MD;  Location: [facility] ORS;  Service: Orthopedics;  Laterality: N/A;   KYPHOPLASTY N/A 08/29/2019   Procedure: L2 KYPHOPLASTY;  Surgeon: Hessie Knows, MD;  Location: [facility] ORS;  Service: Orthopedics;   Laterality: N/A;   KYPHOPLASTY N/A 10/10/2019   Procedure: L3 KYPHOPLASTY;  Surgeon: Hessie Knows, MD;  Location: [facility] ORS;  Service: Orthopedics;  Laterality: N/A;   open heart surgery  2019   valve replacement, aortic arch  aneurysm repair   TONSILLECTOMY     TUBAL LIGATION     WRIST RECONSTRUCTION Right     There were no vitals filed for this visit.   Subjective Assessment - 09/02/21 1511     Subjective R mid to low back bothers her a little. No pain currently in sitting.    Pertinent History Pt is a 81 y.o. female referred to PT for gait and imbalance. PMH includes: Anxiety, skin cancer, GERD, HTN, osteoporosis, L2-L4 kyphoplasties 08/29/19, R hip ORIF. Pt reporting for OPPT for gait/imbalance. Was recently receiving OP PT for R shoulder but that has been complete. Pt 's daughter present to assist in history taking. Pt's daughter reports since covid, open heart surgery, recent surgeries causing balance concerns dating from 2019. No falls in last 6 months, 1 fall in the last year. Near falls reported. Pt is a furniture walker, does use rollator in house. Pt endorsing difficulty with vision, obstacle navigation, clearing feet with walking. Home lay out noted in flow sheets. Pt's goal is to improve balance and strength.    Limitations Lifting;Standing;Walking  Patient Stated Goals Improve balance and strength.    Currently in Pain? No/denies                                        PT Education - 09/02/21 1609     Education Details ther-ex    Person(s) Educated Patient    Methods Explanation;Demonstration;Tactile cues;Verbal cues    Comprehension Returned demonstration;Verbalized understanding              Blood pressure is controlled per pt No latex band alleriges     Therapeutic exercise Pt observed using rollator.    CGA with standing exercises   Seated transversus abdominis contraction 10x5 seconds sets  Seated B scapular retraction 10x5  seconds for 2 sets  Gait with one treadmill rail assist and contralateral UE assist from PT  (CGA to min A) 5 ft             R rail 5x               L rail 5x               Cues needed to increase step width so as to not trip onto her feet.    Gait with one treadmill rail assist and contralateral UE assist from PT  (CGA to min A) with stepping over 2 mini hurdles 2x2  Standing B UE assist              Hip abduction                          R 10x3                         L 10x3   Single leg stand with B UE to one UE assist              8x5 seconds each LE    Gait from gym to car, cues for upright posture using rollator walker.  Curb negotiation (stepping down from a curb) with rollator 1x, cues for proper technique.           Improved exercise technique, movement at target joints, use of target muscles after mod verbal, visual, tactile cues.      Response to treatment Pt tolerated session well without aggravation of symptoms.      Clinical impression Continued working on improving upright posture, trunk, and glute strength to promote single leg stance phase strength with less assist during gait and obstacle negotiation. Good muscle use felt with exercises. Pt tolerated session well without aggravation of symptoms. Pt will benefit from continued skilled physical therapy services to improve strength, balance, function, decrease fall risk and ability to ambulate with less difficulty.      PT Short Term Goals - 08/11/21 1318       PT SHORT TERM GOAL #1   Title Pt will be independent with HEP to improve posture and balance for ADL completion    Baseline 08/11/21: initiated    Time 10    Period Weeks    Status New    Target Date 10/20/21               PT Long Term Goals - 08/18/21 1527       PT LONG TERM GOAL #1   Title Pt  will improve FOTO to target score of 54 to demonstrate clinically significant improvement in functional mobility    Baseline 08/11/21: 43    Time  10    Period Weeks    Status New    Target Date 10/20/21      PT LONG TERM GOAL #2   Title Pt will improve 5xSTS time by 5 seconds without UE support to demonstrate clinically significant improvement in strength.    Baseline 08/11/21: 23.08 sec with UE's on arm rests    Time 10    Period Weeks    Status New    Target Date 10/20/21      PT LONG TERM GOAL #3   Title Pt will improve DGI to 19 to demonstrate clinicially significant improvement in falls reduction with community ambulation tasks.    Baseline 08/11/21: 11/24    Time 10    Period Weeks    Status New    Target Date 10/20/21      PT LONG TERM GOAL #4   Title Pt will improve normal walking speed to > 0.8 m/s to decrease risk of falls with household walking tasks.    Baseline 08/11/21: 0.58 m/s    Time 10    Period Weeks    Status New    Target Date 10/20/21      PT LONG TERM GOAL #5   Title Pt will improve Berg balance by 8 points to display clinically significant improvement in reduced fall risk with ADL's    Baseline 08/18/21: 40/56    Time 10    Period Weeks    Status New    Target Date 10/20/21                   Plan - 09/02/21 1607     Clinical Impression Statement Continued working on improving upright posture, trunk, and glute strength to promote single leg stance phase strength with less assist during gait and obstacle negotiation. Good muscle use felt with exercises. Pt tolerated session well without aggravation of symptoms. Pt will benefit from continued skilled physical therapy services to improve strength, balance, function, decrease fall risk and ability to ambulate with less difficulty.    Personal Factors and Comorbidities Age;Time since onset of injury/illness/exacerbation;Comorbidity 3+;Past/Current Experience    Comorbidities PMH inc;ludes: Anxiety, skin cancer, GERD, HTN, osteoporosis, L2-L4 kyphoplasties 08/29/19, R hip ORIF.    Examination-Activity Limitations Stairs;Bend;Locomotion  Level;Stand;Reach Overhead;Carry;Transfers;Squat    Examination-Participation Restrictions Shop;Community Activity    Stability/Clinical Decision Making Evolving/Moderate complexity    Rehab Potential Fair    PT Frequency 2x / week    PT Duration Other (comment)   10 weeks   PT Treatment/Interventions ADLs/Self Care Home Management;DME Instruction;Gait training;Stair training;Functional mobility training;Therapeutic activities;Therapeutic exercise;Balance training;Neuromuscular re-education;Patient/family education    PT Next Visit Plan Reassess HEP    PT Home Exercise Plan Access Code [email or web address] with scap retractions for posture    Consulted and Agree with Plan of Care Patient;Family member/caregiver    Family Member Consulted Daughter             Patient will benefit from skilled therapeutic intervention in order to improve the following deficits and impairments:  Abnormal gait, Improper body mechanics, Decreased mobility, Postural dysfunction, Decreased activity tolerance, Decreased balance, Decreased strength, Difficulty walking  Visit Diagnosis: Abnormality of gait and mobility  Difficulty in walking, not elsewhere classified  Muscle weakness (generalized)     Problem List Patient Active Problem List   Diagnosis Date Noted  Femur fracture, right, closed, initial encounter 11/25/2015   Joneen Boers PT, DPT  09/02/2021, 4:09 PM  [facility] [address] [place], [address] Phone: [phone]   Fax:  [phone]  Name: April Rogers MRN: [id number] Date of Birth: May 18, 1941

## 2021-09-07 ENCOUNTER — Encounter: Payer: Self-pay | Admitting: Physical Therapy

## 2021-09-08 ENCOUNTER — Other Ambulatory Visit: Payer: Self-pay

## 2021-09-08 ENCOUNTER — Ambulatory Visit: Payer: Medicare HMO | Attending: Orthopedic Surgery

## 2021-09-08 DIAGNOSIS — R262 Difficulty in walking, not elsewhere classified: Secondary | ICD-10-CM | POA: Insufficient documentation

## 2021-09-08 DIAGNOSIS — M6281 Muscle weakness (generalized): Secondary | ICD-10-CM | POA: Diagnosis not present

## 2021-09-08 DIAGNOSIS — R293 Abnormal posture: Secondary | ICD-10-CM | POA: Diagnosis not present

## 2021-09-08 DIAGNOSIS — R269 Unspecified abnormalities of gait and mobility: Secondary | ICD-10-CM | POA: Insufficient documentation

## 2021-09-08 NOTE — Therapy (Signed)
[facility] [address], [place], [address] Phone: [phone]   Fax:  [phone]  Physical Therapy Treatment  Patient Details  Name: April Rogers MRN: [id number] Date of Birth: [birth year] Referring Provider (PT): Duanne Guess, [place]   Encounter Date: 09/08/2021   PT End of Session - 09/08/21 1420     Visit Number 7    Number of Visits 21    Date for PT Re-Evaluation 09/15/21    PT Start Time 1421    PT Stop Time 1503    PT Time Calculation (min) 42 min    Equipment Utilized During Treatment Gait belt    Activity Tolerance Patient tolerated treatment well    Behavior During Therapy WFL for tasks assessed/performed             Past Medical History:  Diagnosis Date   Anxiety    Arthritis    Cancer ([place])    skin cancer   Complication of anesthesia    general anesthesia hard on memory after pt goes home   Depression    Fracture of one rib    GERD (gastroesophageal reflux disease)    was on nexium but no longer needs to take this medication   Hypertension    Macular degeneration of both eyes    Osteoporosis     Past Surgical History:  Procedure Laterality Date   ARM SKIN LESION BIOPSY / EXCISION Right    CHOLECYSTECTOMY     COLONOSCOPY WITH PROPOFOL N/A 08/03/2015   Procedure: COLONOSCOPY WITH PROPOFOL;  Surgeon: Hulen Luster, MD;  Location: [facility] ENDOSCOPY;  Service: Gastroenterology;  Laterality: N/A;   INTRAMEDULLARY (IM) NAIL INTERTROCHANTERIC Right 11/26/2015   Procedure: INTRAMEDULLARY (IM) NAIL INTERTROCHANTRIC;  Surgeon: Earnestine Leys, MD;  Location: [facility] ORS;  Service: Orthopedics;  Laterality: Right;   KYPHOPLASTY N/A 08/16/2019   Procedure: L4 KYPHOPLASTY;  Surgeon: Hessie Knows, MD;  Location: [facility] ORS;  Service: Orthopedics;  Laterality: N/A;   KYPHOPLASTY N/A 08/29/2019   Procedure: L2 KYPHOPLASTY;  Surgeon: Hessie Knows, MD;  Location: [facility] ORS;  Service: Orthopedics;  Laterality:  N/A;   KYPHOPLASTY N/A 10/10/2019   Procedure: L3 KYPHOPLASTY;  Surgeon: Hessie Knows, MD;  Location: [facility] ORS;  Service: Orthopedics;  Laterality: N/A;   open heart surgery  2019   valve replacement, aortic arch  aneurysm repair   TONSILLECTOMY     TUBAL LIGATION     WRIST RECONSTRUCTION Right     There were no vitals filed for this visit.   Subjective Assessment - 09/08/21 1423     Subjective Doing ok. Very little tenderness.    Pertinent History Pt is a 81 y.o. female referred to PT for gait and imbalance. PMH includes: Anxiety, skin cancer, GERD, HTN, osteoporosis, L2-L4 kyphoplasties 08/29/19, R hip ORIF. Pt reporting for OPPT for gait/imbalance. Was recently receiving OP PT for R shoulder but that has been complete. Pt 's daughter present to assist in history taking. Pt's daughter reports since covid, open heart surgery, recent surgeries causing balance concerns dating from 2019. No falls in last 6 months, 1 fall in the last year. Near falls reported. Pt is a furniture walker, does use rollator in house. Pt endorsing difficulty with vision, obstacle navigation, clearing feet with walking. Home lay out noted in flow sheets. Pt's goal is to improve balance and strength.    Limitations Lifting;Standing;Walking    Patient Stated Goals Improve balance and strength.  PT Education - 09/08/21 1513     Education Details ther-ex    Person(s) Educated Patient    Methods Explanation;Demonstration;Tactile cues;Verbal cues    Comprehension Returned demonstration;Verbalized understanding            Blood pressure is controlled per pt No latex band alleriges     Therapeutic exercise Pt observed using rollator.    CGA with standing exercises   Sit <> stand from regular chair with B UE assist 5x   Seated transversus abdominis contraction 10x5 seconds sets.   Improved ability to contract muscle palpated.    Seated B  scapular retraction 10x5 seconds for 2 sets  Gait with SPC on L CGA to min A (with another person following with a chair behind)  3 ft x 2  Unsteady secondary to R > L LE weakness. Cues for increasing BOS   Standing B UE assist              Hip abduction                          R 10x3                         L 10x3  Single leg stand with B UE to one UE assist              10x5 seconds each LE for 2 sets  Standing with B UE assist forward weight shift onto Air Ex pad  R 5x5 seconds holds  L 5x5 second holds               Improved exercise technique, movement at target joints, use of target muscles after mod verbal, visual, tactile cues.      Response to treatment Pt tolerated session well without aggravation of symptoms.      Clinical impression  Worked on ambulation with SPC on L side secondary to hx of R hip ORIF. Unsteady secondary to LE weakness R > L with pt needing cues to increase base of support. Continued working on improving B glute and quad strengthening to promote ability to support herself during single leg stance phase of gait. Pt tolerated session well without aggravation of symptoms. Pt will benefit from continued skilled physical therapy services to improve strength, balance, function, decrease fall risk and ability to ambulate with less difficulty.      PT Short Term Goals - 08/11/21 1318       PT SHORT TERM GOAL #1   Title Pt will be independent with HEP to improve posture and balance for ADL completion    Baseline 08/11/21: initiated    Time 10    Period Weeks    Status New    Target Date 10/20/21               PT Long Term Goals - 08/18/21 1527       PT LONG TERM GOAL #1   Title Pt will improve FOTO to target score of 54 to demonstrate clinically significant improvement in functional mobility    Baseline 08/11/21: 43    Time 10    Period Weeks    Status New    Target Date 10/20/21      PT LONG TERM GOAL #2   Title Pt will improve  5xSTS time by 5 seconds without UE support to demonstrate clinically significant improvement in strength.    Baseline  08/11/21: 23.08 sec with UE's on arm rests    Time 10    Period Weeks    Status New    Target Date 10/20/21      PT LONG TERM GOAL #3   Title Pt will improve DGI to 19 to demonstrate clinicially significant improvement in falls reduction with community ambulation tasks.    Baseline 08/11/21: 11/24    Time 10    Period Weeks    Status New    Target Date 10/20/21      PT LONG TERM GOAL #4   Title Pt will improve normal walking speed to > 0.8 m/s to decrease risk of falls with household walking tasks.    Baseline 08/11/21: 0.58 m/s    Time 10    Period Weeks    Status New    Target Date 10/20/21      PT LONG TERM GOAL #5   Title Pt will improve Berg balance by 8 points to display clinically significant improvement in reduced fall risk with ADL's    Baseline 08/18/21: 40/56    Time 10    Period Weeks    Status New    Target Date 10/20/21                   Plan - 09/08/21 1419     Clinical Impression Statement Worked on ambulation with SPC on L side secondary to hx of R hip ORIF. Unsteady secondary to LE weakness R > L with pt needing cues to increase base of support. Continued working on improving B glute and quad strengthening to promote ability to support herself during single leg stance phase of gait. Pt tolerated session well without aggravation of symptoms. Pt will benefit from continued skilled physical therapy services to improve strength, balance, function, decrease fall risk and ability to ambulate with less difficulty.    Personal Factors and Comorbidities Age;Time since onset of injury/illness/exacerbation;Comorbidity 3+;Past/Current Experience    Comorbidities PMH inc;ludes: Anxiety, skin cancer, GERD, HTN, osteoporosis, L2-L4 kyphoplasties 08/29/19, R hip ORIF.    Examination-Activity Limitations Stairs;Bend;Locomotion Level;Stand;Reach  Overhead;Carry;Transfers;Squat    Examination-Participation Restrictions Shop;Community Activity    Stability/Clinical Decision Making Evolving/Moderate complexity    Rehab Potential Fair    PT Frequency 2x / week    PT Duration Other (comment)   10 weeks   PT Treatment/Interventions ADLs/Self Care Home Management;DME Instruction;Gait training;Stair training;Functional mobility training;Therapeutic activities;Therapeutic exercise;Balance training;Neuromuscular re-education;Patient/family education    PT Next Visit Plan Reassess HEP    PT Home Exercise Plan Access Code [email or web address] with scap retractions for posture    Consulted and Agree with Plan of Care Patient;Family member/caregiver    Family Member Consulted Daughter             Patient will benefit from skilled therapeutic intervention in order to improve the following deficits and impairments:  Abnormal gait, Improper body mechanics, Decreased mobility, Postural dysfunction, Decreased activity tolerance, Decreased balance, Decreased strength, Difficulty walking  Visit Diagnosis: Abnormality of gait and mobility  Difficulty in walking, not elsewhere classified  Muscle weakness (generalized)  Abnormal posture     Problem List Patient Active Problem List   Diagnosis Date Noted   Femur fracture, right, closed, initial encounter 11/25/2015    Joneen Boers PT, DPT  09/08/2021, 3:15 PM  [facility] [address] [place], [address] Phone: [phone]   Fax:  [phone]  Name: April Rogers MRN: [id number] Date  of Birth: 09-21-1940

## 2021-09-09 ENCOUNTER — Ambulatory Visit: Payer: Medicare HMO

## 2021-09-09 DIAGNOSIS — R293 Abnormal posture: Secondary | ICD-10-CM | POA: Diagnosis not present

## 2021-09-09 DIAGNOSIS — R262 Difficulty in walking, not elsewhere classified: Secondary | ICD-10-CM

## 2021-09-09 DIAGNOSIS — R269 Unspecified abnormalities of gait and mobility: Secondary | ICD-10-CM | POA: Diagnosis not present

## 2021-09-09 DIAGNOSIS — M6281 Muscle weakness (generalized): Secondary | ICD-10-CM | POA: Diagnosis not present

## 2021-09-09 NOTE — Therapy (Signed)
[facility] [address], [place], [address] Phone: [phone]   Fax:  [phone]  Physical Therapy Treatment  Patient Details  Name: April Rogers MRN: [id number] Date of Birth: 07/14/1940 Referring Provider (PT): Duanne Guess, [place]   Encounter Date: 09/09/2021   PT End of Session - 09/09/21 1153     Visit Number 8    Number of Visits 21    Date for PT Re-Evaluation 09/15/21    PT Start Time 9326    PT Stop Time 1231    PT Time Calculation (min) 38 min    Equipment Utilized During Treatment Gait belt    Activity Tolerance Patient tolerated treatment well    Behavior During Therapy WFL for tasks assessed/performed             Past Medical History:  Diagnosis Date   Anxiety    Arthritis    Cancer ([place])    skin cancer   Complication of anesthesia    general anesthesia hard on memory after pt goes home   Depression    Fracture of one rib    GERD (gastroesophageal reflux disease)    was on nexium but no longer needs to take this medication   Hypertension    Macular degeneration of both eyes    Osteoporosis     Past Surgical History:  Procedure Laterality Date   ARM SKIN LESION BIOPSY / EXCISION Right    CHOLECYSTECTOMY     COLONOSCOPY WITH PROPOFOL N/A 08/03/2015   Procedure: COLONOSCOPY WITH PROPOFOL;  Surgeon: Hulen Luster, MD;  Location: [facility] ENDOSCOPY;  Service: Gastroenterology;  Laterality: N/A;   INTRAMEDULLARY (IM) NAIL INTERTROCHANTERIC Right 11/26/2015   Procedure: INTRAMEDULLARY (IM) NAIL INTERTROCHANTRIC;  Surgeon: Earnestine Leys, MD;  Location: [facility] ORS;  Service: Orthopedics;  Laterality: Right;   KYPHOPLASTY N/A 08/16/2019   Procedure: L4 KYPHOPLASTY;  Surgeon: Hessie Knows, MD;  Location: [facility] ORS;  Service: Orthopedics;  Laterality: N/A;   KYPHOPLASTY N/A 08/29/2019   Procedure: L2 KYPHOPLASTY;  Surgeon: Hessie Knows, MD;  Location: [facility] ORS;  Service: Orthopedics;  Laterality:  N/A;   KYPHOPLASTY N/A 10/10/2019   Procedure: L3 KYPHOPLASTY;  Surgeon: Hessie Knows, MD;  Location: [facility] ORS;  Service: Orthopedics;  Laterality: N/A;   open heart surgery  2019   valve replacement, aortic arch  aneurysm repair   TONSILLECTOMY     TUBAL LIGATION     WRIST RECONSTRUCTION Right     There were no vitals filed for this visit.   Subjective Assessment - 09/09/21 1154     Subjective Doing good.    Pertinent History Pt is a 81 y.o. female referred to PT for gait and imbalance. PMH includes: Anxiety, skin cancer, GERD, HTN, osteoporosis, L2-L4 kyphoplasties 08/29/19, R hip ORIF. Pt reporting for OPPT for gait/imbalance. Was recently receiving OP PT for R shoulder but that has been complete. Pt 's daughter present to assist in history taking. Pt's daughter reports since covid, open heart surgery, recent surgeries causing balance concerns dating from 2019. No falls in last 6 months, 1 fall in the last year. Near falls reported. Pt is a furniture walker, does use rollator in house. Pt endorsing difficulty with vision, obstacle navigation, clearing feet with walking. Home lay out noted in flow sheets. Pt's goal is to improve balance and strength.    Limitations Lifting;Standing;Walking    Patient Stated Goals Improve balance and strength.    Currently in  Pain? No/denies                                        PT Education - 09/09/21 1216     Education Details ther-ex    Person(s) Educated Patient    Methods Explanation;Demonstration;Tactile cues;Verbal cues    Comprehension Returned demonstration;Verbalized understanding            Blood pressure is controlled per pt No latex band alleriges     Therapeutic exercise Pt observed using rollator.    CGA with standing exercises   Sit <> stand from regular chair with B UE assist 5x. Cues for proper foot positioning and placing COG over BOS  Standing B UE assist              Hip abduction                           R 10x2 with 5 second holds                         L 10x2 with 5 second holds   Standing B scapular retraction 10x5 seconds for 2 sets without UE assist CGA from PT  Standing with B UE assist  Forward step up onto Air Ex pad    R 10x2   L 10x2  Sitting with hips less than 90 degrees flexion  Manually resisted clamshell isometrics 10x5 seconds to promote glute med muscle strengthening   Gait with one treadmill rail assist and contralateral UE assist from PT  (CGA) 5 ft             R rail 5x               L rail 5x               Cues needed to increase step width so as to not trip onto her feet.       Improved exercise technique, movement at target joints, use of target muscles after mod verbal, visual, tactile cues.      Response to treatment Pt tolerated session well without aggravation of symptoms.      Clinical impression Able to ambulate with one UE assist at treadmill with no Min A from PT today 5 ft x 5. Continued working on improving B glute and quad strengthening to promote ability to support herself during single leg stance phase of gait. Pt tolerated session well without aggravation of symptoms. Pt will benefit from continued skilled physical therapy services to improve strength, balance, function, decrease fall risk and ability to ambulate with less difficulty.      PT Short Term Goals - 08/11/21 1318       PT SHORT TERM GOAL #1   Title Pt will be independent with HEP to improve posture and balance for ADL completion    Baseline 08/11/21: initiated    Time 10    Period Weeks    Status New    Target Date 10/20/21               PT Long Term Goals - 08/18/21 1527       PT LONG TERM GOAL #1   Title Pt will improve FOTO to target score of 54 to demonstrate clinically significant improvement in functional mobility    Baseline 08/11/21:  43    Time 10    Period Weeks    Status New    Target Date 10/20/21      PT LONG TERM GOAL #2    Title Pt will improve 5xSTS time by 5 seconds without UE support to demonstrate clinically significant improvement in strength.    Baseline 08/11/21: 23.08 sec with UE's on arm rests    Time 10    Period Weeks    Status New    Target Date 10/20/21      PT LONG TERM GOAL #3   Title Pt will improve DGI to 19 to demonstrate clinicially significant improvement in falls reduction with community ambulation tasks.    Baseline 08/11/21: 11/24    Time 10    Period Weeks    Status New    Target Date 10/20/21      PT LONG TERM GOAL #4   Title Pt will improve normal walking speed to > 0.8 m/s to decrease risk of falls with household walking tasks.    Baseline 08/11/21: 0.58 m/s    Time 10    Period Weeks    Status New    Target Date 10/20/21      PT LONG TERM GOAL #5   Title Pt will improve Berg balance by 8 points to display clinically significant improvement in reduced fall risk with ADL's    Baseline 08/18/21: 40/56    Time 10    Period Weeks    Status New    Target Date 10/20/21                   Plan - 09/09/21 1216     Clinical Impression Statement Able to ambulate with one UE assist at treadmill with no Min A from PT today 5 ft x 5. Continued working on improving B glute and quad strengthening to promote ability to support herself during single leg stance phase of gait. Pt tolerated session well without aggravation of symptoms. Pt will benefit from continued skilled physical therapy services to improve strength, balance, function, decrease fall risk and ability to ambulate with less difficulty.    Personal Factors and Comorbidities Age;Time since onset of injury/illness/exacerbation;Comorbidity 3+;Past/Current Experience    Comorbidities PMH inc;ludes: Anxiety, skin cancer, GERD, HTN, osteoporosis, L2-L4 kyphoplasties 08/29/19, R hip ORIF.    Examination-Activity Limitations Stairs;Bend;Locomotion Level;Stand;Reach Overhead;Carry;Transfers;Squat    Examination-Participation  Restrictions Shop;Community Activity    Stability/Clinical Decision Making Stable/Uncomplicated    Clinical Decision Making Low    Rehab Potential Fair    PT Frequency 2x / week    PT Duration Other (comment)   10 weeks   PT Treatment/Interventions ADLs/Self Care Home Management;DME Instruction;Gait training;Stair training;Functional mobility training;Therapeutic activities;Therapeutic exercise;Balance training;Neuromuscular re-education;Patient/family education    PT Next Visit Plan Reassess HEP    PT Home Exercise Plan Access Code [email or web address] with scap retractions for posture    Consulted and Agree with Plan of Care Patient;Family member/caregiver    Family Member Consulted Daughter             Patient will benefit from skilled therapeutic intervention in order to improve the following deficits and impairments:  Abnormal gait, Improper body mechanics, Decreased mobility, Postural dysfunction, Decreased activity tolerance, Decreased balance, Decreased strength, Difficulty walking  Visit Diagnosis: Abnormality of gait and mobility  Difficulty in walking, not elsewhere classified  Muscle weakness (generalized)  Abnormal posture     Problem List Patient Active Problem List   Diagnosis Date Noted  Femur fracture, right, closed, initial encounter 11/25/2015    Joneen Boers PT, DPT  09/09/2021, 12:55 PM  [facility] [address] [place], [address] Phone: [phone]   Fax:  [phone]  Name: April Rogers MRN: [id number] Date of Birth: [birth year]

## 2021-09-13 ENCOUNTER — Ambulatory Visit: Payer: Medicare HMO

## 2021-09-13 ENCOUNTER — Other Ambulatory Visit: Payer: Self-pay

## 2021-09-13 DIAGNOSIS — R293 Abnormal posture: Secondary | ICD-10-CM

## 2021-09-13 DIAGNOSIS — M6281 Muscle weakness (generalized): Secondary | ICD-10-CM | POA: Diagnosis not present

## 2021-09-13 DIAGNOSIS — R262 Difficulty in walking, not elsewhere classified: Secondary | ICD-10-CM

## 2021-09-13 DIAGNOSIS — R269 Unspecified abnormalities of gait and mobility: Secondary | ICD-10-CM | POA: Diagnosis not present

## 2021-09-13 NOTE — Therapy (Signed)
[facility] [address], [place], [address] Phone: [phone]   Fax:  [phone]  Physical Therapy Treatment  Patient Details  Name: April Rogers MRN: [id number] Date of Birth: 1941/04/24 Referring Provider (PT): Duanne Guess, [place]   Encounter Date: 09/13/2021   PT End of Session - 09/13/21 1505     Visit Number 9    Number of Visits 21    Date for PT Re-Evaluation 09/15/21    PT Start Time 1505    PT Stop Time 1545    PT Time Calculation (min) 40 min    Equipment Utilized During Treatment Gait belt    Activity Tolerance Patient tolerated treatment well    Behavior During Therapy WFL for tasks assessed/performed             Past Medical History:  Diagnosis Date   Anxiety    Arthritis    Cancer ([place])    skin cancer   Complication of anesthesia    general anesthesia hard on memory after pt goes home   Depression    Fracture of one rib    GERD (gastroesophageal reflux disease)    was on nexium but no longer needs to take this medication   Hypertension    Macular degeneration of both eyes    Osteoporosis     Past Surgical History:  Procedure Laterality Date   ARM SKIN LESION BIOPSY / EXCISION Right    CHOLECYSTECTOMY     COLONOSCOPY WITH PROPOFOL N/A 08/03/2015   Procedure: COLONOSCOPY WITH PROPOFOL;  Surgeon: Hulen Luster, MD;  Location: [facility] END[facility]COPY;  Service: Gastroenterology;  Laterality: N/A;   INTRAMEDULLARY (IM) NAIL INTERTROCHANTERIC Right 11/26/2015   Procedure: INTRAMEDULLARY (IM) NAIL INTERTROCHANTRIC;  Surgeon: Earnestine Leys, MD;  Location: [facility] ORS;  Service: Orthopedics;  Laterality: Right;   KYPHOPLASTY N/A 08/16/2019   Procedure: L4 KYPHOPLASTY;  Surgeon: Hessie Knows, MD;  Location: [facility] ORS;  Service: Orthopedics;  Laterality: N/A;   KYPHOPLASTY N/A 08/29/2019   Procedure: L2 KYPHOPLASTY;  Surgeon: Hessie Knows, MD;  Location: [facility] ORS;  Service: Orthopedics;  Laterality:  N/A;   KYPHOPLASTY N/A 10/10/2019   Procedure: L3 KYPHOPLASTY;  Surgeon: Hessie Knows, MD;  Location: [facility] ORS;  Service: Orthopedics;  Laterality: N/A;   open heart surgery  2019   valve replacement, aortic arch  aneurysm repair   TONSILLECTOMY     TUBAL LIGATION     WRIST RECONSTRUCTION Right     There were no vitals filed for this visit.   Subjective Assessment - 09/13/21 1508     Subjective No pain currently.    Pertinent History Pt is a 81 y.o. female referred to PT for gait and imbalance. PMH includes: Anxiety, skin cancer, GERD, HTN, osteoporosis, L2-L4 kyphoplasties 08/29/19, R hip ORIF. Pt reporting for OPPT for gait/imbalance. Was recently receiving OP PT for R shoulder but that has been complete. Pt 's daughter present to assist in history taking. Pt's daughter reports since covid, open heart surgery, recent surgeries causing balance concerns dating from 2019. No falls in last 6 months, 1 fall in the last year. Near falls reported. Pt is a furniture walker, does use rollator in house. Pt endorsing difficulty with vision, obstacle navigation, clearing feet with walking. Home lay out noted in flow sheets. Pt's goal is to improve balance and strength.    Limitations Lifting;Standing;Walking    Patient Stated Goals Improve balance and strength.    Currently  in Pain? No/denies                                        PT Education - 09/13/21 1516     Education Details ther-ex    Person(s) Educated Patient    Methods Explanation;Demonstration;Tactile cues;Verbal cues    Comprehension Returned demonstration;Verbalized understanding             Blood pressure is controlled per pt No latex band alleriges     Therapeutic exercise Pt observed using rollator.    CGA with standing exercises   Sit <> stand from regular chair with B UE assist 5x. Cues for proper foot positioning and placing COG over BOS  Standing B UE assist              Hip  abduction                          R 10x2 with 5 second holds                         L 10x2 with 5 second holds   Standing with B UE assist             Forward step up onto Air Ex pad                          R 10x2                         L 10x2   Manually resisted clamshell isometrics 10x5 seconds to promote glute med muscle strengthening    Standing B scapular retraction 10x5 seconds for 2 sets without UE assist CGA from PT  Sitting with hips less than 90 degrees flexion                Gait with one treadmill rail assist and contralateral UE assist from PT  (CGA to min A) 5 ft             R rail 10x               L rail 10x               Cues needed to increase step width so as to not trip onto her feet.   Single leg stand with B UE to one UE assist              10x5 seconds each LE for 2 sets          Improved exercise technique, movement at target joints, use of target muscles after mod verbal, visual, tactile cues.      Response to treatment Pt tolerated session well without aggravation of symptoms.      Clinical impression Improved repetitions with gait 5 ft with treadmill rail and contralateral PT hand assist to 10x today. Improving functional LE strength observed. Continued working on improving B glute and quad strengthening to promote ability to support herself during single leg stance phase of gait. Pt tolerated session well without aggravation of symptoms. Pt will benefit from continued skilled physical therapy services to improve strength, balance, function, decrease fall risk and ability to ambulate with less difficulty.    PT Short Term Goals - 08/11/21 1318  PT SHORT TERM GOAL #1   Title Pt will be independent with HEP to improve posture and balance for ADL completion    Baseline 08/11/21: initiated    Time 10    Period Weeks    Status New    Target Date 10/20/21               PT Long Term Goals - 08/18/21 1527       PT LONG TERM GOAL  #1   Title Pt will improve FOTO to target score of 54 to demonstrate clinically significant improvement in functional mobility    Baseline 08/11/21: 43    Time 10    Period Weeks    Status New    Target Date 10/20/21      PT LONG TERM GOAL #2   Title Pt will improve 5xSTS time by 5 seconds without UE support to demonstrate clinically significant improvement in strength.    Baseline 08/11/21: 23.08 sec with UE's on arm rests    Time 10    Period Weeks    Status New    Target Date 10/20/21      PT LONG TERM GOAL #3   Title Pt will improve DGI to 19 to demonstrate clinicially significant improvement in falls reduction with community ambulation tasks.    Baseline 08/11/21: 11/24    Time 10    Period Weeks    Status New    Target Date 10/20/21      PT LONG TERM GOAL #4   Title Pt will improve normal walking speed to > 0.8 m/s to decrease risk of falls with household walking tasks.    Baseline 08/11/21: 0.58 m/s    Time 10    Period Weeks    Status New    Target Date 10/20/21      PT LONG TERM GOAL #5   Title Pt will improve Berg balance by 8 points to display clinically significant improvement in reduced fall risk with ADL's    Baseline 08/18/21: 40/56    Time 10    Period Weeks    Status New    Target Date 10/20/21                   Plan - 09/13/21 1517     Clinical Impression Statement Improved repetitions with gait 5 ft with treadmill rail and contralateral PT hand assist to 10x today. Improving functional LE strength observed. Continued working on improving B glute and quad strengthening to promote ability to support herself during single leg stance phase of gait. Pt tolerated session well without aggravation of symptoms. Pt will benefit from continued skilled physical therapy services to improve strength, balance, function, decrease fall risk and ability to ambulate with less difficulty.    Personal Factors and Comorbidities Age;Time since onset of  injury/illness/exacerbation;Comorbidity 3+;Past/Current Experience    Comorbidities PMH inc;ludes: Anxiety, skin cancer, GERD, HTN, osteoporosis, L2-L4 kyphoplasties 08/29/19, R hip ORIF.    Examination-Activity Limitations Stairs;Bend;Locomotion Level;Stand;Reach Overhead;Carry;Transfers;Squat    Examination-Participation Restrictions Shop;Community Activity    Stability/Clinical Decision Making Stable/Uncomplicated    Clinical Decision Making Low    Rehab Potential Fair    PT Frequency 2x / week    PT Duration Other (comment)   10 weeks   PT Treatment/Interventions ADLs/Self Care Home Management;DME Instruction;Gait training;Stair training;Functional mobility training;Therapeutic activities;Therapeutic exercise;Balance training;Neuromuscular re-education;Patient/family education    PT Next Visit Plan Reassess HEP    PT Home Exercise Plan Access Code [email or web address] with  scap retractions for posture    Consulted and Agree with Plan of Care Patient;Family member/caregiver    Family Member Consulted Daughter             Patient will benefit from skilled therapeutic intervention in order to improve the following deficits and impairments:  Abnormal gait, Improper body mechanics, Decreased mobility, Postural dysfunction, Decreased activity tolerance, Decreased balance, Decreased strength, Difficulty walking  Visit Diagnosis: Abnormality of gait and mobility  Difficulty in walking, not elsewhere classified  Muscle weakness (generalized)  Abnormal posture     Problem List Patient Active Problem List   Diagnosis Date Noted   Femur fracture, right, closed, initial encounter 11/25/2015    Joneen Boers PT, DPT   09/13/2021, 4:41 PM  [facility] [address], [place], [address] Phone: [phone]   Fax:  [phone]  Name: April Rogers MRN: [id number] Date of Birth: 06-07-1941

## 2021-09-15 ENCOUNTER — Other Ambulatory Visit: Payer: Self-pay

## 2021-09-15 ENCOUNTER — Ambulatory Visit: Payer: Medicare HMO

## 2021-09-15 DIAGNOSIS — R293 Abnormal posture: Secondary | ICD-10-CM | POA: Diagnosis not present

## 2021-09-15 DIAGNOSIS — M6281 Muscle weakness (generalized): Secondary | ICD-10-CM | POA: Diagnosis not present

## 2021-09-15 DIAGNOSIS — R269 Unspecified abnormalities of gait and mobility: Secondary | ICD-10-CM

## 2021-09-15 DIAGNOSIS — R262 Difficulty in walking, not elsewhere classified: Secondary | ICD-10-CM | POA: Diagnosis not present

## 2021-09-15 NOTE — Therapy (Signed)
[facility] [address] [place], [address] Phone: [phone]   Fax:  [phone]  Physical Therapy Treatment And Progress Report (08/11/2021 - 09/15/2021)  Patient Details  Name: April Rogers MRN: [id number] Date of Birth: 1940/12/25 Referring Provider (PT): Duanne Guess, [place]   Encounter Date: 09/15/2021   PT End of Session - 09/15/21 1510     Visit Number 10    Number of Visits 21    Date for PT Re-Evaluation 78/24/23   from eval cert   PT Start Time 1510    PT Stop Time 1546    PT Time Calculation (min) 36 min    Equipment Utilized During Treatment Gait belt    Activity Tolerance Patient tolerated treatment well    Behavior During Therapy WFL for tasks assessed/performed             Past Medical History:  Diagnosis Date   Anxiety    Arthritis    Cancer ([place])    skin cancer   Complication of anesthesia    general anesthesia hard on memory after pt goes home   Depression    Fracture of one rib    GERD (gastroesophageal reflux disease)    was on nexium but no longer needs to take this medication   Hypertension    Macular degeneration of both eyes    Osteoporosis     Past Surgical History:  Procedure Laterality Date   ARM SKIN LESION BIOPSY / EXCISION Right    CHOLECYSTECTOMY     COLONOSCOPY WITH PROPOFOL N/A 08/03/2015   Procedure: COLONOSCOPY WITH PROPOFOL;  Surgeon: Hulen Luster, MD;  Location: [facility] ENDOSCOPY;  Service: Gastroenterology;  Laterality: N/A;   INTRAMEDULLARY (IM) NAIL INTERTROCHANTERIC Right 11/26/2015   Procedure: INTRAMEDULLARY (IM) NAIL INTERTROCHANTRIC;  Surgeon: Earnestine Leys, MD;  Location: [facility] ORS;  Service: Orthopedics;  Laterality: Right;   KYPHOPLASTY N/A 08/16/2019   Procedure: L4 KYPHOPLASTY;  Surgeon: Hessie Knows, MD;  Location: [facility] ORS;  Service: Orthopedics;  Laterality: N/A;   KYPHOPLASTY N/A 08/29/2019   Procedure: L2 KYPHOPLASTY;  Surgeon: Hessie Knows, MD;  Location: [facility] ORS;  Service: Orthopedics;  Laterality: N/A;   KYPHOPLASTY N/A 10/10/2019   Procedure: L3 KYPHOPLASTY;  Surgeon: Hessie Knows, MD;  Location: [facility] ORS;  Service: Orthopedics;  Laterality: N/A;   open heart surgery  2019   valve replacement, aortic arch  aneurysm repair   TONSILLECTOMY     TUBAL LIGATION     WRIST RECONSTRUCTION Right     There were no vitals filed for this visit.   Subjective Assessment - 09/15/21 1513     Subjective Back is tender currently. No pain though.    Pertinent History Pt is a 81 y.o. female referred to PT for gait and imbalance. PMH includes: Anxiety, skin cancer, GERD, HTN, osteoporosis, L2-L4 kyphoplasties 08/29/19, R hip ORIF. Pt reporting for OPPT for gait/imbalance. Was recently receiving OP PT for R shoulder but that has been complete. Pt 's daughter present to assist in history taking. Pt's daughter reports since covid, open heart surgery, recent surgeries causing balance concerns dating from 2019. No falls in last 6 months, 1 fall in the last year. Near falls reported. Pt is a furniture walker, does use rollator in house. Pt endorsing difficulty with vision, obstacle navigation, clearing feet with walking. Home lay out noted in flow sheets. Pt's goal is to improve balance and strength.    Limitations Lifting;Standing;Walking  Patient Stated Goals Improve balance and strength.    Currently in Pain? No/denies                [facility] PT Assessment - 09/15/21 1522       Berg Balance Test   Sit to Stand Able to stand  independently using hands    Standing Unsupported Able to stand safely 2 minutes    Sitting with Back Unsupported but Feet Supported on Floor or Stool Able to sit safely and securely 2 minutes    Stand to Sit Controls descent by using hands    Transfers Able to transfer safely, definite need of hands    Standing Unsupported with Eyes Closed Able to stand 10 seconds safely    Standing Unsupported with Feet  Together Able to place feet together independently and stand 1 minute safely    From Standing, Reach Forward with Outstretched Arm Can reach forward >12 cm safely (5")    From Standing Position, Pick up Object from [place] to pick up shoe safely and easily    From Standing Position, Turn to Look Behind Over each Shoulder Looks behind one side only/other side shows less weight shift    Turn 360 Degrees Able to turn 360 degrees safely but slowly    Standing Unsupported, Alternately Place Feet on Step/Stool Able to complete 4 steps without aid or supervision    Standing Unsupported, One Foot in [place] to take small step independently and hold 30 seconds    Standing on One Leg Able to lift leg independently and hold equal to or more than 3 seconds    Total Score 43                                    PT Education - 09/15/21 1827     Education Details ther-ex    Person(s) Educated Patient    Methods Explanation;Demonstration;Tactile cues;Verbal cues    Comprehension Returned demonstration;Verbalized understanding           Blood pressure is controlled per pt No latex band alleriges     Therapeutic exercise Pt observed using rollator.    CGA with standing exercises   Sit <> stand 5x with B UE assist  23 seconds  Gait 10 m x 2 using rollator walker  14 seconds, 14 seconds  (0.71 m/s with rollator walker)  Directed patient with sit <> stand throughout session Stand pivot transfer chair <> low mat table Static standing shoulder width apart, then with eyes closed, then with eyes open feet together, tandem stance with feet shoulder width apart Picking up an object (computer mouse) from the floor,   Turning 360 degrees to the R and to the L 1x Looking behind to the R and to the L,   Standing forward reach,    Standing alternate toe taps 4 x each LE onto first regular step  Unable to measure DGI secondary to time constraints        Improved  exercise technique, movement at target joints, use of target muscles after mod verbal, visual, tactile cues.      Response to treatment Pt tolerated session well without aggravation of symptoms.      Clinical impression Pt demonstrates increased Berg balance test score since initiial measurement suggesting decreased fall risk. Pt also demonstrates improved ability to perform functional tasks based on her improved FOTO score by 8 points.  Unable to perform DGI secondary to time constraints. Improved gait speed using rollator walker but unclear whether or not pt used AD during the 10 meter walk test during the eval (different therapist performed the eval). Overall pt making some progress with PT towards goals. Pt tolerated session well without aggravation of symptoms. Pt will benefit from continued skilled physical therapy services to improve strength, balance, function, decrease fall risk and ability to ambulate with less difficulty.    PT Short Term Goals - 09/15/21 1514       PT SHORT TERM GOAL #1   Title Pt will be independent with HEP to improve posture and balance for ADL completion    Baseline 08/11/21: initiated; Has been doing her HEP, no questions (09/15/2021)    Time 10    Period Weeks    Status Achieved    Target Date 10/20/21               PT Long Term Goals - 09/15/21 1516       PT LONG TERM GOAL #1   Title Pt will improve FOTO to target score of 54 to demonstrate clinically significant improvement in functional mobility    Baseline 08/11/21: 43; 51 (09/15/2021)    Time 10    Period Weeks    Status Partially Met    Target Date 10/20/21      PT LONG TERM GOAL #2   Title Pt will improve 5xSTS time by 5 seconds without UE support to demonstrate clinically significant improvement in strength.    Baseline 08/11/21: 23.08 sec with UE's on arm rests; 23 seconds with UE's on arm rests (09/15/2021)    Time 10    Period Weeks    Status On-going    Target Date 10/20/21      PT LONG  TERM GOAL #3   Title Pt will improve DGI to 19 to demonstrate clinicially significant improvement in falls reduction with community ambulation tasks.    Baseline 08/11/21: 11/24    Time 10    Period Weeks    Status Deferred    Target Date 10/20/21      PT LONG TERM GOAL #4   Title Pt will improve normal walking speed to > 0.8 m/s to decrease risk of falls with household walking tasks.    Baseline 08/11/21: 0.58 m/s; 0.71 m/s with rollator walker (09/15/2021)    Time 10    Period Weeks    Status On-going    Target Date 10/20/21      PT LONG TERM GOAL #5   Title Pt will improve Berg balance by 8 points to display clinically significant improvement in reduced fall risk with ADL's    Baseline 08/18/21: 40/56; 43/56 (09/15/2021)    Time 10    Period Weeks    Status Partially Met    Target Date 10/20/21                   Plan - 09/15/21 1826     Clinical Impression Statement Pt demonstrates increased Berg balance test score since initiial measurement suggesting decreased fall risk. Pt also demonstrates improved ability to perform functional tasks based on her improved FOTO score by 8 points. Unable to perform DGI secondary to time constraints. Improved gait speed using rollator walker but unclear whether or not pt used AD during the 10 meter walk test during the eval (different therapist performed the eval). Overall pt making some progress with PT towards goals. Pt tolerated session  well without aggravation of symptoms. Pt will benefit from continued skilled physical therapy services to improve strength, balance, function, decrease fall risk and ability to ambulate with less difficulty.    Personal Factors and Comorbidities Age;Time since onset of injury/illness/exacerbation;Comorbidity 3+;Past/Current Experience    Comorbidities PMH inc;ludes: Anxiety, skin cancer, GERD, HTN, osteoporosis, L2-L4 kyphoplasties 08/29/19, R hip ORIF.    Examination-Activity Limitations Stairs;Bend;Locomotion  Level;Stand;Reach Overhead;Carry;Transfers;Squat    Examination-Participation Restrictions Shop;Community Activity    Stability/Clinical Decision Making Stable/Uncomplicated    Clinical Decision Making Low    Rehab Potential Fair    PT Frequency 2x / week    PT Duration Other (comment)   10 weeks   PT Treatment/Interventions ADLs/Self Care Home Management;DME Instruction;Gait training;Stair training;Functional mobility training;Therapeutic activities;Therapeutic exercise;Balance training;Neuromuscular re-education;Patient/family education    PT Next Visit Plan Reassess HEP    PT Home Exercise Plan Access Code [email or web address] with scap retractions for posture    Consulted and Agree with Plan of Care Patient;Family member/caregiver    Family Member Consulted Daughter             Patient will benefit from skilled therapeutic intervention in order to improve the following deficits and impairments:  Abnormal gait, Improper body mechanics, Decreased mobility, Postural dysfunction, Decreased activity tolerance, Decreased balance, Decreased strength, Difficulty walking  Visit Diagnosis: Abnormality of gait and mobility  Difficulty in walking, not elsewhere classified  Muscle weakness (generalized)  Abnormal posture     Problem List Patient Active Problem List   Diagnosis Date Noted   Femur fracture, right, closed, initial encounter 11/25/2015   Thank you for your referral,  Joneen Boers PT, DPT  09/15/2021, 6:27 PM  [facility] [address], [place], [address] Phone: [phone]   Fax:  [phone]  Name: April Rogers MRN: [id number] Date of Birth: [birth year]

## 2021-09-21 ENCOUNTER — Ambulatory Visit: Payer: Medicare HMO | Admitting: Physical Therapy

## 2021-09-21 ENCOUNTER — Other Ambulatory Visit: Payer: Self-pay

## 2021-09-21 ENCOUNTER — Encounter: Payer: Self-pay | Admitting: Physical Therapy

## 2021-09-21 DIAGNOSIS — R293 Abnormal posture: Secondary | ICD-10-CM | POA: Diagnosis not present

## 2021-09-21 DIAGNOSIS — R269 Unspecified abnormalities of gait and mobility: Secondary | ICD-10-CM

## 2021-09-21 DIAGNOSIS — M6281 Muscle weakness (generalized): Secondary | ICD-10-CM | POA: Diagnosis not present

## 2021-09-21 DIAGNOSIS — R262 Difficulty in walking, not elsewhere classified: Secondary | ICD-10-CM | POA: Diagnosis not present

## 2021-09-21 NOTE — Therapy (Signed)
[facility] ?[place], [place], [address] ?Phone: [phone]   Fax:  [phone] ? ?Physical Therapy Treatment ? ?Patient Details  ?Name: April Rogers ?MRN: [id number] ?Date of Birth: [birth year] ?Referring Provider (PT): Duanne Guess, [place] ? ? ?Encounter Date: 09/21/2021 ? ? PT End of Session - 79/14/23 1105   ? ? Visit Number 11   ? Number of Visits 21   ? Date for PT Re-Evaluation 79/75/10   from eval cert  ? Authorization Type Authorized 20 visits 2/8-4/15   ? PT Start Time 1105   ? PT Stop Time 1145   ? PT Time Calculation (min) 40 min   ? Equipment Utilized During Treatment Gait belt   ? Activity Tolerance Patient tolerated treatment well   ? Behavior During Therapy [facility] for tasks assessed/performed   ? ?  ?  ? ?  ? ? ?Past Medical History:  ?Diagnosis Date  ? Anxiety   ? Arthritis   ? Cancer [facility])   ? skin cancer  ? Complication of anesthesia   ? general anesthesia hard on memory after pt goes home  ? Depression   ? Fracture of one rib   ? GERD (gastroesophageal reflux disease)   ? was on nexium but no longer needs to take this medication  ? Hypertension   ? Macular degeneration of both eyes   ? Osteoporosis   ? ? ?Past Surgical History:  ?Procedure Laterality Date  ? ARM SKIN LESION BIOPSY / EXCISION Right   ? CHOLECYSTECTOMY    ? COLONOSCOPY WITH PROPOFOL N/A 08/03/2015  ? Procedure: COLONOSCOPY WITH PROPOFOL;  Surgeon: Hulen Luster, MD;  Location: [facility] ENDOSCOPY;  Service: Gastroenterology;  Laterality: N/A;  ? INTRAMEDULLARY (IM) NAIL INTERTROCHANTERIC Right 11/26/2015  ? Procedure: INTRAMEDULLARY (IM) NAIL INTERTROCHANTRIC;  Surgeon: Earnestine Leys, MD;  Location: [facility] ORS;  Service: Orthopedics;  Laterality: Right;  ? KYPHOPLASTY N/A 08/16/2019  ? Procedure: L4 KYPHOPLASTY;  Surgeon: Hessie Knows, MD;  Location: [facility] ORS;  Service: Orthopedics;  Laterality: N/A;  ? KYPHOPLASTY N/A 08/29/2019  ? Procedure: L2 KYPHOPLASTY;  Surgeon: Hessie Knows, MD;  Location: [facility] ORS;  Service: Orthopedics;  Laterality: N/A;  ? KYPHOPLASTY N/A 10/10/2019  ? Procedure: L3 KYPHOPLASTY;  Surgeon: Hessie Knows, MD;  Location: [facility] ORS;  Service: Orthopedics;  Laterality: N/A;  ? open heart surgery  2019  ? valve replacement, aortic arch  aneurysm repair  ? TONSILLECTOMY    ? TUBAL LIGATION    ? WRIST RECONSTRUCTION Right   ? ? ?There were no vitals filed for this visit. ? ? Subjective Assessment - 09/21/21 1109   ? ? Subjective Patient reports some soreness in back in the morning, but as the day progresses it gets better. She does report some stiffness in the morning; Denies any new falls/stumbles since last session;   ? Pertinent History Pt is a 81 y.o. female referred to PT for gait and imbalance. PMH includes: Anxiety, skin cancer, GERD, HTN, osteoporosis, L2-L4 kyphoplasties 08/29/19, R hip ORIF. Pt reporting for OPPT for gait/imbalance. Was recently receiving OP PT for R shoulder but that has been complete. Pt 's daughter present to assist in history taking. Pt's daughter reports since covid, open heart surgery, recent surgeries causing balance concerns dating from 2019. No falls in last 6 months, 1 fall in the last year. Near falls reported. Pt is a furniture walker, does use rollator in house. Pt endorsing difficulty with  vision, obstacle navigation, clearing feet with walking. Home lay out noted in flow sheets. Pt's goal is to improve balance and strength.   ? Limitations Lifting;Standing;Walking   ? Patient Stated Goals Improve balance and strength.   ? Currently in Pain? No/denies   ? Multiple Pain Sites No   ? ?  ?  ? ?  ? ? ? ?TREATMENT: ?Standing in parallel bars: ?With yellow tband around BLE: ?Hip abduction SLR x10 reps each LE with cues for core stabilization and erect posture; ?Side stepping x15 feet x2 laps each direction; Required cues to keep foot forward and avoid hip ER for better hip abductor strengthening;  ?Patient denies any pain but does  report fatigue ? ?Instructed patient in seated LE strengthening: ?Seated with yellow tband around BLE: ?-hip flexion march x10 reps each LE ?-hip abduction/ER x10 reps ?-LAQ x10 reps each LE;  ? ?Provided written HEP, see patient instructions ? ?NMR: ?Standing on airex pad: ?-feet apart 30 sec hold unsupported, no sway, CGA for safety ?-Feet together, 30 sec hold unsupported, eyes open, mild sway, requiring CGA for safety ? Progressed eyes closed 10 sec hold with min A and increased lateral sway ?Standing on airex pad: ? Alternate toe taps to 6 inch step with 2-1-0 rail assist x15 reps with min A for safety ?Patient required increased time and cues for erect posture and foot positioning. She was hesitant to reduce rail assist but was able to complete with good control and positioning;  ? ?Patient tolerated session well. She denies any increase in pain. She does report feeling like she worked her muscles but states that she's not too tired.  ? ? ? ? ? ? ? ? ? ? ? ? ? ? ? ? ? ? ? ? ? ? ? ? PT Education - 09/21/21 1105   ? ? Education Details balance/exercise position, HEP reinforced;   ? Person(s) Educated Patient   ? Methods Explanation;Verbal cues   ? Comprehension Verbalized understanding;Returned demonstration;Verbal cues required;Need further instruction   ? ?  ?  ? ?  ? ? ? PT Short Term Goals - 09/15/21 1514   ? ?  ? PT SHORT TERM GOAL #1  ? Title Pt will be independent with HEP to improve posture and balance for ADL completion   ? Baseline 08/11/21: initiated; Has been doing her HEP, no questions (09/15/2021)   ? Time 10   ? Period Weeks   ? Status Achieved   ? Target Date 10/20/21   ? ?  ?  ? ?  ? ? ? ? PT Long Term Goals - 09/15/21 1516   ? ?  ? PT LONG TERM GOAL #1  ? Title Pt will improve FOTO to target score of 54 to demonstrate clinically significant improvement in functional mobility   ? Baseline 08/11/21: 43; 51 (09/15/2021)   ? Time 10   ? Period Weeks   ? Status Partially Met   ? Target Date 10/20/21   ?  ?  PT LONG TERM GOAL #2  ? Title Pt will improve 5xSTS time by 5 seconds without UE support to demonstrate clinically significant improvement in strength.   ? Baseline 08/11/21: 23.08 sec with UE's on arm rests; 23 seconds with UE's on arm rests (09/15/2021)   ? Time 10   ? Period Weeks   ? Status On-going   ? Target Date 10/20/21   ?  ? PT LONG TERM GOAL #3  ? Title Pt will  improve DGI to 19 to demonstrate clinicially significant improvement in falls reduction with community ambulation tasks.   ? Baseline 08/11/21: 11/24   ? Time 10   ? Period Weeks   ? Status Deferred   ? Target Date 10/20/21   ?  ? PT LONG TERM GOAL #4  ? Title Pt will improve normal walking speed to > 0.8 m/s to decrease risk of falls with household walking tasks.   ? Baseline 08/11/21: 0.58 m/s; 0.71 m/s with rollator walker (09/15/2021)   ? Time 10   ? Period Weeks   ? Status On-going   ? Target Date 10/20/21   ?  ? PT LONG TERM GOAL #5  ? Title Pt will improve Berg balance by 8 points to display clinically significant improvement in reduced fall risk with ADL's   ? Baseline 08/18/21: 40/56; 43/56 (09/15/2021)   ? Time 10   ? Period Weeks   ? Status Partially Met   ? Target Date 10/20/21   ? ?  ?  ? ?  ? ? ? ? ? ? ? ? Plan - 09/21/21 1154   ? ? Clinical Impression Statement Patient motivated and participated well within session. Progressed LE strengthening exercise, utilizing yellow resistance band. She does require min VCs for proper exercise technique and positioning. Provided written HEP for adherence. Patient instructed in advanced balance tasks. She is hesitant to reduce rail assist due to fear of falling. Patient does require CGA to min A while on compliant surfaces. She exhibits decreased ankle strategies relying on UE assist to correct loss of balance. Patient would benefit from additional skilled PT intervention to improve strength, balance and mobility;   ? Personal Factors and Comorbidities Age;Time since onset of  injury/illness/exacerbation;Comorbidity 3+;Past/Current Experience   ? Comorbidities PMH inc;ludes: Anxiety, skin cancer, GERD, HTN, osteoporosis, L2-L4 kyphoplasties 08/29/19, R hip ORIF.   ? Examination-Activity Limitations Stairs;Bend;Locomot

## 2021-09-21 NOTE — Patient Instructions (Signed)
Access Code: [email or web address] ?URL: [email or web address] ?Date: 09/21/2021 ?Prepared by: Blanche East ? ?Exercises ?Seated March with Resistance - 1 x daily - 7 x weekly - 1 sets - 10 reps ?Seated Hip Abduction with Resistance - 1 x daily - 7 x weekly - 1 sets - 10 reps ?Seated Long Arc Quad - 1 x daily - 7 x weekly - 1 sets - 10 reps ? ?

## 2021-09-22 ENCOUNTER — Ambulatory Visit: Payer: Medicare HMO | Admitting: Physical Therapy

## 2021-09-22 ENCOUNTER — Encounter: Payer: Self-pay | Admitting: Physical Therapy

## 2021-09-22 DIAGNOSIS — M6281 Muscle weakness (generalized): Secondary | ICD-10-CM | POA: Diagnosis not present

## 2021-09-22 DIAGNOSIS — R293 Abnormal posture: Secondary | ICD-10-CM

## 2021-09-22 DIAGNOSIS — R269 Unspecified abnormalities of gait and mobility: Secondary | ICD-10-CM | POA: Diagnosis not present

## 2021-09-22 DIAGNOSIS — R262 Difficulty in walking, not elsewhere classified: Secondary | ICD-10-CM

## 2021-09-22 NOTE — Therapy (Signed)
[place] [facility] ?[place], [place], [address] ?Phone: [phone]   Fax:  [phone] ? ?Physical Therapy Treatment ? ?Patient Details  ?Name: April Rogers ?MRN: [id number] ?Date of Birth: [birth year] ?Referring Provider (PT): Duanne Guess, [place] ? ? ?Encounter Date: 09/22/2021 ? ? PT End of Session - 09/22/21 1155   ? ? Visit Number 12   ? Number of Visits 21   ? Date for PT Re-Evaluation 25/42/70   from eval cert  ? Authorization Type Authorized 20 visits 2/8-4/15   ? PT Start Time 1148   ? PT Stop Time 1230   ? PT Time Calculation (min) 42 min   ? Equipment Utilized During Treatment Gait belt   ? Activity Tolerance Patient tolerated treatment well   ? Behavior During Therapy [facility] for tasks assessed/performed   ? ?  ?  ? ?  ? ? ?Past Medical History:  ?Diagnosis Date  ? Anxiety   ? Arthritis   ? Cancer [facility])   ? skin cancer  ? Complication of anesthesia   ? general anesthesia hard on memory after pt goes home  ? Depression   ? Fracture of one rib   ? GERD (gastroesophageal reflux disease)   ? was on nexium but no longer needs to take this medication  ? Hypertension   ? Macular degeneration of both eyes   ? Osteoporosis   ? ? ?Past Surgical History:  ?Procedure Laterality Date  ? ARM SKIN LESION BIOPSY / EXCISION Right   ? CHOLECYSTECTOMY    ? COLONOSCOPY WITH PROPOFOL N/A 08/03/2015  ? Procedure: COLONOSCOPY WITH PROPOFOL;  Surgeon: Hulen Luster, MD;  Location: [facility] ENDOSCOPY;  Service: Gastroenterology;  Laterality: N/A;  ? INTRAMEDULLARY (IM) NAIL INTERTROCHANTERIC Right 11/26/2015  ? Procedure: INTRAMEDULLARY (IM) NAIL INTERTROCHANTRIC;  Surgeon: Earnestine Leys, MD;  Location: [facility] ORS;  Service: Orthopedics;  Laterality: Right;  ? KYPHOPLASTY N/A 08/16/2019  ? Procedure: L4 KYPHOPLASTY;  Surgeon: Hessie Knows, MD;  Location: [facility] ORS;  Service: Orthopedics;  Laterality: N/A;  ? KYPHOPLASTY N/A 08/29/2019  ? Procedure: L2 KYPHOPLASTY;  Surgeon: Hessie Knows, MD;  Location: [facility] ORS;  Service: Orthopedics;  Laterality: N/A;  ? KYPHOPLASTY N/A 10/10/2019  ? Procedure: L3 KYPHOPLASTY;  Surgeon: Hessie Knows, MD;  Location: [facility] ORS;  Service: Orthopedics;  Laterality: N/A;  ? open heart surgery  2019  ? valve replacement, aortic arch  aneurysm repair  ? TONSILLECTOMY    ? TUBAL LIGATION    ? WRIST RECONSTRUCTION Right   ? ? ?There were no vitals filed for this visit. ? ? Subjective Assessment - 09/22/21 1154   ? ? Subjective Patient reports doing well. no pain currently. Reports that she slept really well last night;   ? Pertinent History Pt is a 81 y.o. female referred to PT for gait and imbalance. PMH includes: Anxiety, skin cancer, GERD, HTN, osteoporosis, L2-L4 kyphoplasties 08/29/19, R hip ORIF. Pt reporting for OPPT for gait/imbalance. Was recently receiving OP PT for R shoulder but that has been complete. Pt 's daughter present to assist in history taking. Pt's daughter reports since covid, open heart surgery, recent surgeries causing balance concerns dating from 2019. No falls in last 6 months, 1 fall in the last year. Near falls reported. Pt is a furniture walker, does use rollator in house. Pt endorsing difficulty with vision, obstacle navigation, clearing feet with walking. Home lay out noted in flow sheets. Pt's goal is  to improve balance and strength.   ? Limitations Lifting;Standing;Walking   ? Patient Stated Goals Improve balance and strength.   ? Currently in Pain? No/denies   ? Multiple Pain Sites No   ? ?  ?  ? ?  ? ? ? ?TREATMENT: ?Warm up on Nustep BUE/BLE level 2 x4 min (Unbilled): ?  ?NMR: ?Standing on airex pad: ?-Feet together, 30 sec hold unsupported, eyes open, mild sway, requiring CGA for safety ?            Progressed eyes closed 10 sec hold with min A and increased lateral sway ?Standing on airex pad: ?            Alternate toe taps to 6 inch step with 2-1-0 rail assist x15 reps with min A for safety ? Standing one foot on airex, one  foot on 8 inch step (2nd step) unsupported 15 sec hold x1 rep each ? Progressed with standing one foot on airex, one foot on 6 in step, ball pass side/side x5 reps each foot on step, min A for safety with increased unsteadiness; ? ?Standing on airex: ?Tandem stance with 1-0 rail assist 10 sec hold x2 reps each foot in front, required min A for safety with increased lateral instability noted. Required min VCs to increase erect posture and increase core stabilization; ? ?Patient came in parallel bars: ?Forward walking in ladder (1 foot per square) x4 laps with intermittent rail assist. Patient required cues to increase step length for better foot clearance. She often started with better step but with increased repetition returned to shorter step often catching heel on ladder;  ? ? ? ?Patient required increased time and cues for erect posture and foot positioning. She was hesitant to reduce rail assist but was able to complete with good control and positioning;  ?  ?Patient tolerated session well. She denies any increase in pain. She does report feeling like she worked her muscles but states that she's not too tired.  ?  ? ? ? ? ? ? ? ? ? ? ? ? ? ? ? ? ? ? ? ? ? ? ? ? ? PT Education - 09/22/21 1155   ? ? Education Details balance/exercise position, HEP reinforced;   ? Person(s) Educated Patient   ? Methods Explanation;Verbal cues   ? Comprehension Verbalized understanding;Returned demonstration;Verbal cues required;Need further instruction   ? ?  ?  ? ?  ? ? ? PT Short Term Goals - 09/15/21 1514   ? ?  ? PT SHORT TERM GOAL #1  ? Title Pt will be independent with HEP to improve posture and balance for ADL completion   ? Baseline 08/11/21: initiated; Has been doing her HEP, no questions (09/15/2021)   ? Time 10   ? Period Weeks   ? Status Achieved   ? Target Date 10/20/21   ? ?  ?  ? ?  ? ? ? ? PT Long Term Goals - 09/15/21 1516   ? ?  ? PT LONG TERM GOAL #1  ? Title Pt will improve FOTO to target score of 54 to demonstrate  clinically significant improvement in functional mobility   ? Baseline 08/11/21: 43; 51 (09/15/2021)   ? Time 10   ? Period Weeks   ? Status Partially Met   ? Target Date 10/20/21   ?  ? PT LONG TERM GOAL #2  ? Title Pt will improve 5xSTS time by 5 seconds without UE support to demonstrate  clinically significant improvement in strength.   ? Baseline 08/11/21: 23.08 sec with UE's on arm rests; 23 seconds with UE's on arm rests (09/15/2021)   ? Time 10   ? Period Weeks   ? Status On-going   ? Target Date 10/20/21   ?  ? PT LONG TERM GOAL #3  ? Title Pt will improve DGI to 19 to demonstrate clinicially significant improvement in falls reduction with community ambulation tasks.   ? Baseline 08/11/21: 11/24   ? Time 10   ? Period Weeks   ? Status Deferred   ? Target Date 10/20/21   ?  ? PT LONG TERM GOAL #4  ? Title Pt will improve normal walking speed to > 0.8 m/s to decrease risk of falls with household walking tasks.   ? Baseline 08/11/21: 0.58 m/s; 0.71 m/s with rollator walker (09/15/2021)   ? Time 10   ? Period Weeks   ? Status On-going   ? Target Date 10/20/21   ?  ? PT LONG TERM GOAL #5  ? Title Pt will improve Berg balance by 8 points to display clinically significant improvement in reduced fall risk with ADL's   ? Baseline 08/18/21: 40/56; 43/56 (09/15/2021)   ? Time 10   ? Period Weeks   ? Status Partially Met   ? Target Date 10/20/21   ? ?  ?  ? ?  ? ? ? ? ? ? ? ? Plan - 09/23/21 0836   ? ? Clinical Impression Statement Patient motivated and participated well within session. She was instructed in advanced balance exercise utilizing compliant surface to challenge stance control.Patient does exhibit increased difficulty with narrow base of support (tandem stance) and when standing with less rail assist. She requires min A for safety when unsupported and is hesitant to reduce rail assist. She also requires cues for erect posture in standing for better stance control. She denies any increase in pain at end of session. She would  benefit from additional skilled PT Intervention to improve strength, balance and mobility;   ? Personal Factors and Comorbidities Age;Time since onset of injury/illness/exacerbation;Comorbidity 3+;Past/Current

## 2021-09-27 ENCOUNTER — Other Ambulatory Visit: Payer: Self-pay

## 2021-09-27 ENCOUNTER — Ambulatory Visit: Payer: Medicare HMO | Admitting: Physical Therapy

## 2021-09-27 DIAGNOSIS — R262 Difficulty in walking, not elsewhere classified: Secondary | ICD-10-CM

## 2021-09-27 DIAGNOSIS — R293 Abnormal posture: Secondary | ICD-10-CM

## 2021-09-27 DIAGNOSIS — R269 Unspecified abnormalities of gait and mobility: Secondary | ICD-10-CM

## 2021-09-27 DIAGNOSIS — M6281 Muscle weakness (generalized): Secondary | ICD-10-CM

## 2021-09-27 NOTE — Therapy (Signed)
April Rogers ?[facility] ?[place], [place], [address] ?Phone: [phone]   Fax:  [phone] ? ?Physical Therapy Treatment ? ?Patient Details  ?Name: April Rogers ?MRN: [id number] ?Date of Birth: Jun 02, 1941 ?Referring Provider (PT): April Rogers, [place] ? ? ?Encounter Date: 09/27/2021 ? ? PT End of Session - 09/28/21 1044   ? ? Visit Number 13   ? Number of Visits 21   ? Date for PT Re-Evaluation 58/85/02   from eval cert  ? Authorization Type Authorized 20 visits 2/8-4/15   ? PT Start Time 1432   ? PT Stop Time 1515   ? PT Time Calculation (min) 43 min   ? Equipment Utilized During Treatment Gait belt   ? Activity Tolerance Patient tolerated treatment well   ? Behavior During Therapy [facility] for tasks assessed/performed   ? ?  ?  ? ?  ? ? ?Past Medical History:  ?Diagnosis Date  ? Anxiety   ? Arthritis   ? Cancer [facility])   ? skin cancer  ? Complication of anesthesia   ? general anesthesia hard on memory after pt goes home  ? Depression   ? Fracture of one rib   ? GERD (gastroesophageal reflux disease)   ? was on nexium but no longer needs to take this medication  ? Hypertension   ? Macular degeneration of both eyes   ? Osteoporosis   ? ? ?Past Surgical History:  ?Procedure Laterality Date  ? ARM SKIN LESION BIOPSY / EXCISION Right   ? CHOLECYSTECTOMY    ? COLONOSCOPY WITH PROPOFOL N/A 08/03/2015  ? Procedure: COLONOSCOPY WITH PROPOFOL;  Surgeon: Hulen Luster, MD;  [facility] ENDOSCOPY;  Service: Gastroenterology;  Laterality: N/A;  ? INTRAMEDULLARY (IM) NAIL INTERTROCHANTERIC Right 11/26/2015  ? Procedure: INTRAMEDULLARY (IM) NAIL INTERTROCHANTRIC;  Surgeon: Earnestine Leys, MD;  Location: [facility] ORS;  Service: Orthopedics;  Laterality: Right;  ? KYPHOPLASTY N/A 08/16/2019  ? Procedure: L4 KYPHOPLASTY;  Surgeon: Hessie Knows, MD;  Location: [facility] ORS;  Service: Orthopedics;  Laterality: N/A;  ? KYPHOPLASTY N/A 08/29/2019  ? Procedure: L2 KYPHOPLASTY;  Surgeon: Hessie Knows, MD;  Location: [facility] ORS;  Service: Orthopedics;  Laterality: N/A;  ? KYPHOPLASTY N/A 10/10/2019  ? Procedure: L3 KYPHOPLASTY;  Surgeon: Hessie Knows, MD;  Location: [facility] ORS;  Service: Orthopedics;  Laterality: N/A;  ? open heart surgery  2019  ? valve replacement, aortic arch  aneurysm repair  ? TONSILLECTOMY    ? TUBAL LIGATION    ? WRIST RECONSTRUCTION Right   ? ? ?There were no vitals filed for this visit. ? ? Subjective Assessment - 09/28/21 1043   ? ? Subjective Patient reports doing well. she reports falling last week and hit her left hip. She reports having some soreness over the weekend but states its better now. She was able to get up with effort. She presents to therapy with rollator.   ? Pertinent History Pt is a 81 y.o. female referred to PT for gait and imbalance. PMH includes: Anxiety, skin cancer, GERD, HTN, osteoporosis, L2-L4 kyphoplasties 08/29/19, R hip ORIF. Pt reporting for OPPT for gait/imbalance. Was recently receiving OP PT for R shoulder but that has been complete. Pt 's daughter present to assist in history taking. Pt's daughter reports since covid, open heart surgery, recent surgeries causing balance concerns dating from 2019. No falls in last 6 months, 1 fall in the last year. Near falls reported. Pt is a furniture walker,  does use rollator in house. Pt endorsing difficulty with vision, obstacle navigation, clearing feet with walking. Home lay out noted in flow sheets. Pt's goal is to improve balance and strength.   ? Limitations Lifting;Standing;Walking   ? Patient Stated Goals Improve balance and strength.   ? Currently in Pain? No/denies   ? Multiple Pain Sites No   ? ?  ?  ? ?  ? ? ? ? ?  ?TREATMENT: ?Patient shared how she fell over the weekend and how she was able to get up, rolling to side and using table leg to pull self up with effort;  ?  ?NMR: ?Patient came in parallel bars: ?Forward walking in ladder (1 foot per square) x4 laps with intermittent rail assist. Patient  required cues to increase step length for better foot clearance. She often started with better step but with increased repetition returned to shorter step often catching heel on ladder;  ?Side stepping x2 laps each direction with cues to increase step length for better foot clearance; ?Forward march x2 laps with moderate difficulty lifting LE; ? ?Airex beam: ?Forward walking x2 laps ?Tandem stance with 2-0 rail assist 20 sec hold x2 reps each foot in front ?Progressed to tandem stance with BUE ball pass side/side x3 reps each foot in front x2 sets each; ?Side stepping x3 laps each direction with fingertip hold;  ?Progressed to reaching outside base of support side/side x5 reps each direction;  ? ?Standing on airex beam facing side, side step over orange hurdle x5 reps each direction with BUE rail assist with cues to increase hip flexion for better foot clearance;   ?  ?Patient required increased time and cues for erect posture and foot positioning. She was hesitant to reduce rail assist but was able to complete with good control and positioning;  ?  ?Patient tolerated session well. She denies any increase in pain. She does report feeling like she worked her muscles but states that she's not too tired.  ?  ?  ?  ? ? ? ? ? ? ? ? ? ? ? ? ? ? ? ? ? ? ? ? ? ? ? ? PT Education - 09/28/21 1044   ? ? Education Details balance/exercise position, HEP   ? Person(s) Educated Patient   ? Methods Explanation;Verbal cues   ? Comprehension Verbalized understanding;Returned demonstration;Verbal cues required;Need further instruction   ? ?  ?  ? ?  ? ? ? PT Short Term Goals - 09/15/21 1514   ? ?  ? PT SHORT TERM GOAL #1  ? Title Pt will be independent with HEP to improve posture and balance for ADL completion   ? Baseline 08/11/21: initiated; Has been doing her HEP, no questions (09/15/2021)   ? Time 10   ? Period Weeks   ? Status Achieved   ? Target Date 10/20/21   ? ?  ?  ? ?  ? ? ? ? PT Long Term Goals - 09/15/21 1516   ? ?  ? PT LONG  TERM GOAL #1  ? Title Pt will improve FOTO to target score of 54 to demonstrate clinically significant improvement in functional mobility   ? Baseline 08/11/21: 43; 51 (09/15/2021)   ? Time 10   ? Period Weeks   ? Status Partially Met   ? Target Date 10/20/21   ?  ? PT LONG TERM GOAL #2  ? Title Pt will improve 5xSTS time by 5 seconds without UE support to  demonstrate clinically significant improvement in strength.   ? Baseline 08/11/21: 23.08 sec with UE's on arm rests; 23 seconds with UE's on arm rests (09/15/2021)   ? Time 10   ? Period Weeks   ? Status On-going   ? Target Date 10/20/21   ?  ? PT LONG TERM GOAL #3  ? Title Pt will improve DGI to 19 to demonstrate clinicially significant improvement in falls reduction with community ambulation tasks.   ? Baseline 08/11/21: 11/24   ? Time 10   ? Period Weeks   ? Status Deferred   ? Target Date 10/20/21   ?  ? PT LONG TERM GOAL #4  ? Title Pt will improve normal walking speed to > 0.8 m/s to decrease risk of falls with household walking tasks.   ? Baseline 08/11/21: 0.58 m/s; 0.71 m/s with rollator walker (09/15/2021)   ? Time 10   ? Period Weeks   ? Status On-going   ? Target Date 10/20/21   ?  ? PT LONG TERM GOAL #5  ? Title Pt will improve Berg balance by 8 points to display clinically significant improvement in reduced fall risk with ADL's   ? Baseline 08/18/21: 40/56; 43/56 (09/15/2021)   ? Time 10   ? Period Weeks   ? Status Partially Met   ? Target Date 10/20/21   ? ?  ?  ? ?  ? ? ? ? ? ? ? ? Plan - 09/28/21 1045   ? ? Clinical Impression Statement Patient motivated and participated well within session. she was instructed in advanced balance tasks. Utilized ladder drills to challenge step length. Patient has difficulty clearing rungs often stepping with short shuffled step. Patient does exhibit better balance with tandem stance being able to progress with trunk rotation/reaching outside base of support. She does have difficulty stepping over orange hurdle while on airex beam,  requiring rail assist for safety. Patient does require CGA to min A throughout session for better stance control. She would benefit from additional skilled PT Intervention to improve strength, balance and m

## 2021-09-28 ENCOUNTER — Encounter: Payer: Self-pay | Admitting: Physical Therapy

## 2021-09-29 ENCOUNTER — Encounter: Payer: Self-pay | Admitting: Physical Therapy

## 2021-09-29 ENCOUNTER — Ambulatory Visit: Payer: Medicare HMO | Admitting: Physical Therapy

## 2021-09-29 DIAGNOSIS — R293 Abnormal posture: Secondary | ICD-10-CM | POA: Diagnosis not present

## 2021-09-29 DIAGNOSIS — M6281 Muscle weakness (generalized): Secondary | ICD-10-CM

## 2021-09-29 DIAGNOSIS — E785 Hyperlipidemia, unspecified: Secondary | ICD-10-CM | POA: Diagnosis not present

## 2021-09-29 DIAGNOSIS — R262 Difficulty in walking, not elsewhere classified: Secondary | ICD-10-CM

## 2021-09-29 DIAGNOSIS — I10 Essential (primary) hypertension: Secondary | ICD-10-CM | POA: Diagnosis not present

## 2021-09-29 DIAGNOSIS — R269 Unspecified abnormalities of gait and mobility: Secondary | ICD-10-CM | POA: Diagnosis not present

## 2021-09-29 DIAGNOSIS — D696 Thrombocytopenia, unspecified: Secondary | ICD-10-CM | POA: Diagnosis not present

## 2021-09-29 NOTE — Therapy (Signed)
[facility] ?[facility] ?[place], [place], [address] ?Phone: [phone]   Fax:  [phone] ? ?Physical Therapy Treatment ? ?Patient Details  ?Name: April Rogers ?MRN: [id number] ?Date of Birth: 1940/09/23 ?Referring Provider (PT): Duanne Guess, [place] ? ? ?Encounter Date: 09/29/2021 ? ? PT End of Session - 09/29/21 1439   ? ? Visit Number 14   ? Number of Visits 21   ? Date for PT Re-Evaluation 31/51/76   from eval cert  ? Authorization Type Authorized 20 visits 2/8-4/15   ? PT Start Time 1432   ? PT Stop Time 1515   ? PT Time Calculation (min) 43 min   ? Equipment Utilized During Treatment Gait belt   ? Activity Tolerance Patient tolerated treatment well   ? Behavior During Therapy [facility] for tasks assessed/performed   ? ?  ?  ? ?  ? ? ?Past Medical History:  ?Diagnosis Date  ? Anxiety   ? Arthritis   ? Cancer [facility])   ? skin cancer  ? Complication of anesthesia   ? general anesthesia hard on memory after pt goes home  ? Depression   ? Fracture of one rib   ? GERD (gastroesophageal reflux disease)   ? was on nexium but no longer needs to take this medication  ? Hypertension   ? Macular degeneration of both eyes   ? Osteoporosis   ? ? ?Past Surgical History:  ?Procedure Laterality Date  ? ARM SKIN LESION BIOPSY / EXCISION Right   ? CHOLECYSTECTOMY    ? COLONOSCOPY WITH PROPOFOL N/A 08/03/2015  ? Procedure: COLONOSCOPY WITH PROPOFOL;  Surgeon: Hulen Luster, MD;  Location: [facility] ENDOSCOPY;  Service: Gastroenterology;  Laterality: N/A;  ? INTRAMEDULLARY (IM) NAIL INTERTROCHANTERIC Right 11/26/2015  ? Procedure: INTRAMEDULLARY (IM) NAIL INTERTROCHANTRIC;  Surgeon: Earnestine Leys, MD;  Location: [facility] ORS;  Service: Orthopedics;  Laterality: Right;  ? KYPHOPLASTY N/A 08/16/2019  ? Procedure: L4 KYPHOPLASTY;  Surgeon: Hessie Knows, MD;  Location: [facility] ORS;  Service: Orthopedics;  Laterality: N/A;  ? KYPHOPLASTY N/A 08/29/2019  ? Procedure: L2 KYPHOPLASTY;  Surgeon: Hessie Knows, MD;  Location: [facility] ORS;  Service: Orthopedics;  Laterality: N/A;  ? KYPHOPLASTY N/A 10/10/2019  ? Procedure: L3 KYPHOPLASTY;  Surgeon: Hessie Knows, MD;  Location: [facility] ORS;  Service: Orthopedics;  Laterality: N/A;  ? open heart surgery  2019  ? valve replacement, aortic arch  aneurysm repair  ? TONSILLECTOMY    ? TUBAL LIGATION    ? WRIST RECONSTRUCTION Right   ? ? ?There were no vitals filed for this visit. ? ? Subjective Assessment - 09/29/21 1437   ? ? Subjective Patient reports doing well. She reports no soreness. She denies any new falls. She reports her exercises at home are going well. She is doing them with [name];   ? Pertinent History Pt is a 81 y.o. female referred to PT for gait and imbalance. PMH includes: Anxiety, skin cancer, GERD, HTN, osteoporosis, L2-L4 kyphoplasties 08/29/19, R hip ORIF. Pt reporting for OPPT for gait/imbalance. Was recently receiving OP PT for R shoulder but that has been complete. Pt 's daughter present to assist in history taking. Pt's daughter reports since covid, open heart surgery, recent surgeries causing balance concerns dating from 2019. No falls in last 6 months, 1 fall in the last year. Near falls reported. Pt is a furniture walker, does use rollator in house. Pt endorsing difficulty with vision, obstacle navigation, clearing  feet with walking. Home lay out noted in flow sheets. Pt's goal is to improve balance and strength.   ? Limitations Lifting;Standing;Walking   ? Patient Stated Goals Improve balance and strength.   ? Currently in Pain? No/denies   ? ?  ?  ? ?  ? ? ? ? ? ? ? ? ?TREATMENT: ?Warm up on Nustep BUE/BLE level 1-3 (interval training) x4 min with cues to keep steps per minute >50; vitals after exercise, HR 80, Spo2 97% ? ?  ?NMR: ?Patient came in parallel bars: ?Forward walking in ladder (1 foot per square) x4 laps with intermittent rail assist. Patient required cues to increase step length for better foot clearance. She often started with better  step but with increased repetition returned to shorter step often catching heel on ladder;  ?Forward march x4 laps, intermittent rail assist, with better hip flexion AROM this session compared to previous session;  ?  ?Standing on airex pad: ?Feet together: ? Unsupported standing x30 sec ? Progressed to head turns side/side x5 reps ? Progressed to BUE arm raise x5 reps with cues to keep thumbs up for better tolerance with shoulder ROM; ?Standing one foot on one airex, other foot on 2nd airex (staggered stance) ? Unsupported standing x30 sec, CGA for safety ? Progressed to head turns side/side x5 reps ? Progressed to BUE shoulder flexion x5 reps ?Completed with each foot in front;  ?Patient required CGA to min A with increased instability noted in staggered stance. She required increased time for positioning and cues for erect posture;  ?  ? ?Exercise: ?Sit<>Stand with forward reach of BUE, unsupported x5 reps with min A progressing to CGA with better LE muscle activation ?Seated hamstring curl red tband x10 reps each LE ?Seated hip flexion lifting LE over 1/2 bolster 2# ankle weight x10 reps each LE with cues for core stabilization to avoid strain on low back; ?  ?Patient tolerated session well. She denies any increase in pain. She does report feeling like she worked her muscles but states that she's not too tired.  ?  ?  ?  ?  ? ? ? ? ? ? ? ? ? ? ? ? ? ? ? ? ? ? ? ? PT Education - 09/29/21 1438   ? ? Education Details balance/exercise technique, HEP reinforced;   ? Person(s) Educated Patient   ? Methods Explanation;Verbal cues   ? Comprehension Verbalized understanding;Returned demonstration;Verbal cues required;Need further instruction   ? ?  ?  ? ?  ? ? ? PT Short Term Goals - 09/15/21 1514   ? ?  ? PT SHORT TERM GOAL #1  ? Title Pt will be independent with HEP to improve posture and balance for ADL completion   ? Baseline 08/11/21: initiated; Has been doing her HEP, no questions (09/15/2021)   ? Time 10   ? Period  Weeks   ? Status Achieved   ? Target Date 10/20/21   ? ?  ?  ? ?  ? ? ? ? PT Long Term Goals - 09/15/21 1516   ? ?  ? PT LONG TERM GOAL #1  ? Title Pt will improve FOTO to target score of 54 to demonstrate clinically significant improvement in functional mobility   ? Baseline 08/11/21: 43; 51 (09/15/2021)   ? Time 10   ? Period Weeks   ? Status Partially Met   ? Target Date 10/20/21   ?  ? PT LONG TERM GOAL #2  ?  Title Pt will improve 5xSTS time by 5 seconds without UE support to demonstrate clinically significant improvement in strength.   ? Baseline 08/11/21: 23.08 sec with UE's on arm rests; 23 seconds with UE's on arm rests (09/15/2021)   ? Time 10   ? Period Weeks   ? Status On-going   ? Target Date 10/20/21   ?  ? PT LONG TERM GOAL #3  ? Title Pt will improve DGI to 19 to demonstrate clinicially significant improvement in falls reduction with community ambulation tasks.   ? Baseline 08/11/21: 11/24   ? Time 10   ? Period Weeks   ? Status Deferred   ? Target Date 10/20/21   ?  ? PT LONG TERM GOAL #4  ? Title Pt will improve normal walking speed to > 0.8 m/s to decrease risk of falls with household walking tasks.   ? Baseline 08/11/21: 0.58 m/s; 0.71 m/s with rollator walker (09/15/2021)   ? Time 10   ? Period Weeks   ? Status On-going   ? Target Date 10/20/21   ?  ? PT LONG TERM GOAL #5  ? Title Pt will improve Berg balance by 8 points to display clinically significant improvement in reduced fall risk with ADL's   ? Baseline 08/18/21: 40/56; 43/56 (09/15/2021)   ? Time 10   ? Period Weeks   ? Status Partially Met   ? Target Date 10/20/21   ? ?  ?  ? ?  ? ? ? ? ? ? ? ? Plan - 09/30/21 1555   ? ? Clinical Impression Statement Patient motivated and participated well within session. She was instructed in advanced exercise, utilizing nustep for interval training to challenge strength and cardiovascular conditioning. Instructed patine tin advnaced seated strengthening exercise for better LE mobility. She does require min VCs for  correct exercise technique. Also instructed patient in advanced balance exercise, utilizing ladder for coordination and to challenge step length. She continues to exhibit decreased step length especially with red

## 2021-10-04 ENCOUNTER — Ambulatory Visit: Payer: Medicare HMO

## 2021-10-04 ENCOUNTER — Other Ambulatory Visit: Payer: Self-pay

## 2021-10-04 DIAGNOSIS — R269 Unspecified abnormalities of gait and mobility: Secondary | ICD-10-CM | POA: Diagnosis not present

## 2021-10-04 DIAGNOSIS — R293 Abnormal posture: Secondary | ICD-10-CM

## 2021-10-04 DIAGNOSIS — R262 Difficulty in walking, not elsewhere classified: Secondary | ICD-10-CM

## 2021-10-04 DIAGNOSIS — M6281 Muscle weakness (generalized): Secondary | ICD-10-CM | POA: Diagnosis not present

## 2021-10-04 NOTE — Therapy (Signed)
[place] ?[facility] ?[place], [place], [address] ?Phone: [phone]   Fax:  [phone] ? ?Physical Therapy Treatment ? ?Patient Details  ?Name: April Rogers ?MRN: [id number] ?Date of Birth: 05/30/1941 ?Referring Provider (PT): Duanne Guess, [place] ? ? ?Encounter Date: 10/04/2021 ? ? PT End of Session - 10/04/21 1439   ? ? Visit Number 15   ? Number of Visits 21   ? Date for PT Re-Evaluation 22/63/33   from eval cert  ? Authorization Type Authorized 20 visits 2/8-4/15   ? PT Start Time 1432   ? PT Stop Time 1515   ? PT Time Calculation (min) 43 min   ? Equipment Utilized During Treatment Gait belt   ? Activity Tolerance Patient tolerated treatment well   ? Behavior During Therapy [facility] for tasks assessed/performed   ? ?  ?  ? ?  ? ? ?Past Medical History:  ?Diagnosis Date  ? Anxiety   ? Arthritis   ? Cancer [facility])   ? skin cancer  ? Complication of anesthesia   ? general anesthesia hard on memory after pt goes home  ? Depression   ? Fracture of one rib   ? GERD (gastroesophageal reflux disease)   ? was on nexium but no longer needs to take this medication  ? Hypertension   ? Macular degeneration of both eyes   ? Osteoporosis   ? ? ?Past Surgical History:  ?Procedure Laterality Date  ? ARM SKIN LESION BIOPSY / EXCISION Right   ? CHOLECYSTECTOMY    ? COLONOSCOPY WITH PROPOFOL N/A 08/03/2015  ? Procedure: COLONOSCOPY WITH PROPOFOL;  Surgeon: Hulen Luster, MD;  Location: [facility] ENDOSCOPY;  Service: Gastroenterology;  Laterality: N/A;  ? INTRAMEDULLARY (IM) NAIL INTERTROCHANTERIC Right 11/26/2015  ? Procedure: INTRAMEDULLARY (IM) NAIL INTERTROCHANTRIC;  Surgeon: Earnestine Leys, MD;  Location: [facility] ORS;  Service: Orthopedics;  Laterality: Right;  ? KYPHOPLASTY N/A 08/16/2019  ? Procedure: L4 KYPHOPLASTY;  Surgeon: Hessie Knows, MD;  Location: [facility] ORS;  Service: Orthopedics;  Laterality: N/A;  ? KYPHOPLASTY N/A 08/29/2019  ? Procedure: L2 KYPHOPLASTY;  Surgeon: Hessie Knows, MD;  Location: [facility] ORS;  Service: Orthopedics;  Laterality: N/A;  ? KYPHOPLASTY N/A 10/10/2019  ? Procedure: L3 KYPHOPLASTY;  Surgeon: Hessie Knows, MD;  Location: [facility] ORS;  Service: Orthopedics;  Laterality: N/A;  ? open heart surgery  2019  ? valve replacement, aortic arch  aneurysm repair  ? TONSILLECTOMY    ? TUBAL LIGATION    ? WRIST RECONSTRUCTION Right   ? ? ?There were no vitals filed for this visit. ? ? Subjective Assessment - 10/04/21 1437   ? ? Subjective Patient reports having a spasms in her "buttocks" in standing position near her bed that made her freeze but not fall- But finally the spasm relaxed and she was able to continue walking.   ? Pertinent History Pt is a 81 y.o. female referred to PT for gait and imbalance. PMH includes: Anxiety, skin cancer, GERD, HTN, osteoporosis, L2-L4 kyphoplasties 08/29/19, R hip ORIF. Pt reporting for OPPT for gait/imbalance. Was recently receiving OP PT for R shoulder but that has been complete. Pt 's daughter present to assist in history taking. Pt's daughter reports since covid, open heart surgery, recent surgeries causing balance concerns dating from 2019. No falls in last 6 months, 1 fall in the last year. Near falls reported. Pt is a furniture walker, does use rollator in house. Pt endorsing difficulty  with vision, obstacle navigation, clearing feet with walking. Home lay out noted in flow sheets. Pt's goal is to improve balance and strength.   ? Limitations Lifting;Standing;Walking   ? Patient Stated Goals Improve balance and strength.   ? ?  ?  ? ?  ? ? ? ?INTERVENTIONS:  ? ? ? ? ?TREATMENT: ?  ?NMR: At support bar  ? ?- Stand march on airex pad - attempted without UE support - patient unable- required 1 UE Support with 3 sec hold x 20 reps bilateral.  ? ?-Staggered standing on airex pad - hold 30 sec x 3 each LE.  ? ?-Staggered standing on airex pad with horizontal head turns- - Increased unsteadiness with UE reaching for support.   ? ?-Standing on  airex pad: ?Feet together: ?            Unsupported standing - arranging Letters on dry erase board in alpabetical order - Increased time to perform task and verbal cues to keep hands off support bar.        ?  ?  ?Education provided throughout session via VC/TC and demonstration to facilitate movement at target joints and correct muscle activation for all testing and exercises performed.  ?  ? ?  ?  ?  ?  ?  ? ? ? ? ? ? ? ? ? ? ? ? ? ? ? ? ? ? ? ? ? ? PT Education - 10/04/21 1438   ? ? Education Details Balance/exercise technique   ? Person(s) Educated Patient   ? Methods Explanation;Demonstration;Tactile cues;Verbal cues   ? Comprehension Verbalized understanding;Returned demonstration;Verbal cues required;Tactile cues required;Need further instruction   ? ?  ?  ? ?  ? ? ? PT Short Term Goals - 09/15/21 1514   ? ?  ? PT SHORT TERM GOAL #1  ? Title Pt will be independent with HEP to improve posture and balance for ADL completion   ? Baseline 08/11/21: initiated; Has been doing her HEP, no questions (09/15/2021)   ? Time 10   ? Period Weeks   ? Status Achieved   ? Target Date 10/20/21   ? ?  ?  ? ?  ? ? ? ? PT Long Term Goals - 09/15/21 1516   ? ?  ? PT LONG TERM GOAL #1  ? Title Pt will improve FOTO to target score of 54 to demonstrate clinically significant improvement in functional mobility   ? Baseline 08/11/21: 43; 51 (09/15/2021)   ? Time 10   ? Period Weeks   ? Status Partially Met   ? Target Date 10/20/21   ?  ? PT LONG TERM GOAL #2  ? Title Pt will improve 5xSTS time by 5 seconds without UE support to demonstrate clinically significant improvement in strength.   ? Baseline 08/11/21: 23.08 sec with UE's on arm rests; 23 seconds with UE's on arm rests (09/15/2021)   ? Time 10   ? Period Weeks   ? Status On-going   ? Target Date 10/20/21   ?  ? PT LONG TERM GOAL #3  ? Title Pt will improve DGI to 19 to demonstrate clinicially significant improvement in falls reduction with community ambulation tasks.   ? Baseline 08/11/21:  11/24   ? Time 10   ? Period Weeks   ? Status Deferred   ? Target Date 10/20/21   ?  ? PT LONG TERM GOAL #4  ? Title Pt will improve normal walking speed  to > 0.8 m/s to decrease risk of falls with household walking tasks.   ? Baseline 08/11/21: 0.58 m/s; 0.71 m/s with rollator walker (09/15/2021)   ? Time 10   ? Period Weeks   ? Status On-going   ? Target Date 10/20/21   ?  ? PT LONG TERM GOAL #5  ? Title Pt will improve Berg balance by 8 points to display clinically significant improvement in reduced fall risk with ADL's   ? Baseline 08/18/21: 40/56; 43/56 (09/15/2021)   ? Time 10   ? Period Weeks   ? Status Partially Met   ? Target Date 10/20/21   ? ?  ?  ? ?  ? ? ? ? ? ? ? ? Plan - 10/04/21 1439   ? ? Clinical Impression Statement Patient presented with good motivation for today's session. Treatment focused on static and dynamic balance today. Patient was challenged with staggered standing and later with dual task activity. She continues to require some UE support due to loss of balance. She was able to demo mostly ankle strategy. She did require some VC to stay on task today. Patient would benefit from additional skilled PT Intervention to improve strength, balance and mobility.   ? Personal Factors and Comorbidities Age;Time since onset of injury/illness/exacerbation;Comorbidity 3+;Past/Current Experience   ? Comorbidities PMH inc;ludes: Anxiety, skin cancer, GERD, HTN, osteoporosis, L2-L4 kyphoplasties 08/29/19, R hip ORIF.   ? Examination-Activity Limitations Stairs;Bend;Locomotion Level;Stand;Reach Overhead;Carry;Transfers;Squat   ? Examination-Participation Restrictions Shop;Community Activity   ? Stability/Clinical Decision Making Stable/Uncomplicated   ? Rehab Potential Fair   ? PT Frequency 2x / week   ? PT Duration Other (comment)   10 weeks  ? PT Treatment/Interventions ADLs/Self Care Home Management;DME Instruction;Gait training;Stair training;Functional mobility training;Therapeutic activities;Therapeutic  exercise;Balance training;Neuromuscular re-education;Patient/family education   ? PT Next Visit Plan Progress balance as appropriate next session.   ? PT Home Exercise Plan Access Code [email or web address] with scap retr

## 2021-10-06 ENCOUNTER — Ambulatory Visit: Payer: Medicare HMO | Admitting: Physical Therapy

## 2021-10-06 ENCOUNTER — Encounter: Payer: Self-pay | Admitting: Physical Therapy

## 2021-10-06 ENCOUNTER — Other Ambulatory Visit: Payer: Self-pay

## 2021-10-06 DIAGNOSIS — F3342 Major depressive disorder, recurrent, in full remission: Secondary | ICD-10-CM | POA: Diagnosis not present

## 2021-10-06 DIAGNOSIS — I700 Atherosclerosis of aorta: Secondary | ICD-10-CM | POA: Diagnosis not present

## 2021-10-06 DIAGNOSIS — I110 Hypertensive heart disease with heart failure: Secondary | ICD-10-CM | POA: Diagnosis not present

## 2021-10-06 DIAGNOSIS — R269 Unspecified abnormalities of gait and mobility: Secondary | ICD-10-CM

## 2021-10-06 DIAGNOSIS — Z0000 Encounter for general adult medical examination without abnormal findings: Secondary | ICD-10-CM | POA: Diagnosis not present

## 2021-10-06 DIAGNOSIS — Z9889 Other specified postprocedural states: Secondary | ICD-10-CM | POA: Diagnosis not present

## 2021-10-06 DIAGNOSIS — R262 Difficulty in walking, not elsewhere classified: Secondary | ICD-10-CM | POA: Diagnosis not present

## 2021-10-06 DIAGNOSIS — M6281 Muscle weakness (generalized): Secondary | ICD-10-CM

## 2021-10-06 DIAGNOSIS — R293 Abnormal posture: Secondary | ICD-10-CM

## 2021-10-06 DIAGNOSIS — Z952 Presence of prosthetic heart valve: Secondary | ICD-10-CM | POA: Diagnosis not present

## 2021-10-06 DIAGNOSIS — J449 Chronic obstructive pulmonary disease, unspecified: Secondary | ICD-10-CM | POA: Diagnosis not present

## 2021-10-06 DIAGNOSIS — I5022 Chronic systolic (congestive) heart failure: Secondary | ICD-10-CM | POA: Diagnosis not present

## 2021-10-06 DIAGNOSIS — I471 Supraventricular tachycardia: Secondary | ICD-10-CM | POA: Diagnosis not present

## 2021-10-06 DIAGNOSIS — D696 Thrombocytopenia, unspecified: Secondary | ICD-10-CM | POA: Diagnosis not present

## 2021-10-06 DIAGNOSIS — Z1389 Encounter for screening for other disorder: Secondary | ICD-10-CM | POA: Diagnosis not present

## 2021-10-06 NOTE — Patient Instructions (Signed)
Access Code: [email or web address] ?URL: [email or web address] ?Date: 10/06/2021 ?Prepared by: Blanche East ? ?Exercises ?- Standing Heel Raise with Support  - 1 x daily - 7 x weekly - 1 sets - 10 reps ?- Standing Toe Raises at Chair  - 1 x daily - 7 x weekly - 1 sets - 10 reps ?- Standing Hip Abduction with Resistance at Ankles and Counter Support  - 1 x daily - 3 x weekly - 1 sets - 10 reps ?- Standing Hip Extension with Resistance at Ankles and Counter Support  - 1 x daily - 3 x weekly - 1 sets - 10 reps ?- Side Stepping with Resistance at Ankles and Counter Support  - 1 x daily - 3 x weekly - 3 sets - 10 reps ?

## 2021-10-06 NOTE — Therapy (Signed)
[place] ?[facility] ?[place], [place], [address] ?Phone: [phone]   Fax:  [phone] ? ?Physical Therapy Treatment ? ?Patient Details  ?Name: April Rogers ?MRN: [id number] ?Date of Birth: 05-09-1941 ?Referring Provider (PT): Duanne Guess, [place] ? ? ?Encounter Date: 10/06/2021 ? ? PT End of Session - 10/06/21 1152   ? ? Visit Number 16   ? Number of Visits 21   ? Date for PT Re-Evaluation 32/95/18   from eval cert  ? Authorization Type Authorized 20 visits 2/8-4/15   ? PT Start Time 1146   ? PT Stop Time 1230   ? PT Time Calculation (min) 44 min   ? Equipment Utilized During Treatment Gait belt   ? Activity Tolerance Patient tolerated treatment well   ? Behavior During Therapy [facility] for tasks assessed/performed   ? ?  ?  ? ?  ? ? ?Past Medical History:  ?Diagnosis Date  ? Anxiety   ? Arthritis   ? Cancer [facility])   ? skin cancer  ? Complication of anesthesia   ? general anesthesia hard on memory after pt goes home  ? Depression   ? Fracture of one rib   ? GERD (gastroesophageal reflux disease)   ? was on nexium but no longer needs to take this medication  ? Hypertension   ? Macular degeneration of both eyes   ? Osteoporosis   ? ? ?Past Surgical History:  ?Procedure Laterality Date  ? ARM SKIN LESION BIOPSY / EXCISION Right   ? CHOLECYSTECTOMY    ? COLONOSCOPY WITH PROPOFOL N/A 08/03/2015  ? Procedure: COLONOSCOPY WITH PROPOFOL;  Surgeon: Hulen Luster, MD;  Location: [facility] ENDOSCOPY;  Service: Gastroenterology;  Laterality: N/A;  ? INTRAMEDULLARY (IM) NAIL INTERTROCHANTERIC Right 11/26/2015  ? Procedure: INTRAMEDULLARY (IM) NAIL INTERTROCHANTRIC;  Surgeon: Earnestine Leys, MD;  Location: [facility] ORS;  Service: Orthopedics;  Laterality: Right;  ? KYPHOPLASTY N/A 08/16/2019  ? Procedure: L4 KYPHOPLASTY;  Surgeon: Hessie Knows, MD;  Location: [facility] ORS;  Service: Orthopedics;  Laterality: N/A;  ? KYPHOPLASTY N/A 08/29/2019  ? Procedure: L2 KYPHOPLASTY;  Surgeon: Hessie Knows, MD;  Location: [facility] ORS;  Service: Orthopedics;  Laterality: N/A;  ? KYPHOPLASTY N/A 10/10/2019  ? Procedure: L3 KYPHOPLASTY;  Surgeon: Hessie Knows, MD;  Location: [facility] ORS;  Service: Orthopedics;  Laterality: N/A;  ? open heart surgery  2019  ? valve replacement, aortic arch  aneurysm repair  ? TONSILLECTOMY    ? TUBAL LIGATION    ? WRIST RECONSTRUCTION Right   ? ? ?There were no vitals filed for this visit. ? ? Subjective Assessment - 10/06/21 1151   ? ? Subjective Patient reports doing well; No new changes; She reports doing exercises at home with help from daughter.   ? Pertinent History Pt is a 81 y.o. female referred to PT for gait and imbalance. PMH includes: Anxiety, skin cancer, GERD, HTN, osteoporosis, L2-L4 kyphoplasties 08/29/19, R hip ORIF. Pt reporting for OPPT for gait/imbalance. Was recently receiving OP PT for R shoulder but that has been complete. Pt 's daughter present to assist in history taking. Pt's daughter reports since covid, open heart surgery, recent surgeries causing balance concerns dating from 2019. No falls in last 6 months, 1 fall in the last year. Near falls reported. Pt is a furniture walker, does use rollator in house. Pt endorsing difficulty with vision, obstacle navigation, clearing feet with walking. Home lay out noted in flow sheets. Pt's  goal is to improve balance and strength.   ? Limitations Lifting;Standing;Walking   ? Patient Stated Goals Improve balance and strength.   ? Currently in Pain? No/denies   ? Multiple Pain Sites No   ? ?  ?  ? ?  ? ? ? ? ? ? ? ? ?TREATMENT: ?Warm up on Nustep BUE/BLE level 1-3 (interval training) x5 min with cues to keep steps per minute >50; vitals after exercise, HR 73, Spo2 97% ? ?  ?EX: ?Patient came in parallel bars: ?PT applied 1.5# ankle weights:  ?Forward walking in ladder (1 foot per square) x4 laps with intermittent rail assist. Patient required cues to increase step length for better foot clearance. She often started with  better step but with increased repetition returned to shorter step often catching heel on ladder;  ?Forward march x4 laps, BUE rail assist, with better hip flexion AROM this session compared to previous session;  ?Side stepping x2 laps each direction with BUE rail assist and cues for erect posture and to increase step length for better foot clearance;  ?  ?Advanced HEP: ?Standing with yellow tband around BLE: ?-hip abduction x10 reps ?-hip extension x10 reps ?-side stepping x10 feet x2 laps  ?Requires UE assist on railing; Required min VCs for proper positioning including to avoid flexed posture and to improve LE positioning for hip strengthening; ? ?Standing heel/toe raises x10 reps with cues to avoid hip movement and isolate ankle ROM; ? ?Patient does report increased fatigue with advanced exercise. Provided written instruction for better adherence;  ?  ?Patient tolerated session well. She denies any increase in pain. She does report feeling like she worked her muscles but states that she's not too tired.  ?  ?  ?  ?  ? ? ? ? ? ? ? ? ? ? ? ? ? ? ? ? ? ? ? ? PT Education - 10/06/21 1152   ? ? Education Details HEP   ? Person(s) Educated Patient   ? Methods Explanation;Verbal cues;Handout   ? Comprehension Need further instruction;Verbalized understanding;Returned demonstration;Verbal cues required   ? ?  ?  ? ?  ? ? ? PT Short Term Goals - 09/15/21 1514   ? ?  ? PT SHORT TERM GOAL #1  ? Title Pt will be independent with HEP to improve posture and balance for ADL completion   ? Baseline 08/11/21: initiated; Has been doing her HEP, no questions (09/15/2021)   ? Time 10   ? Period Weeks   ? Status Achieved   ? Target Date 10/20/21   ? ?  ?  ? ?  ? ? ? ? PT Long Term Goals - 09/15/21 1516   ? ?  ? PT LONG TERM GOAL #1  ? Title Pt will improve FOTO to target score of 54 to demonstrate clinically significant improvement in functional mobility   ? Baseline 08/11/21: 43; 51 (09/15/2021)   ? Time 10   ? Period Weeks   ? Status  Partially Met   ? Target Date 10/20/21   ?  ? PT LONG TERM GOAL #2  ? Title Pt will improve 5xSTS time by 5 seconds without UE support to demonstrate clinically significant improvement in strength.   ? Baseline 08/11/21: 23.08 sec with UE's on arm rests; 23 seconds with UE's on arm rests (09/15/2021)   ? Time 10   ? Period Weeks   ? Status On-going   ? Target Date 10/20/21   ?  ?  PT LONG TERM GOAL #3  ? Title Pt will improve DGI to 19 to demonstrate clinicially significant improvement in falls reduction with community ambulation tasks.   ? Baseline 08/11/21: 11/24   ? Time 10   ? Period Weeks   ? Status Deferred   ? Target Date 10/20/21   ?  ? PT LONG TERM GOAL #4  ? Title Pt will improve normal walking speed to > 0.8 m/s to decrease risk of falls with household walking tasks.   ? Baseline 08/11/21: 0.58 m/s; 0.71 m/s with rollator walker (09/15/2021)   ? Time 10   ? Period Weeks   ? Status On-going   ? Target Date 10/20/21   ?  ? PT LONG TERM GOAL #5  ? Title Pt will improve Berg balance by 8 points to display clinically significant improvement in reduced fall risk with ADL's   ? Baseline 08/18/21: 40/56; 43/56 (09/15/2021)   ? Time 10   ? Period Weeks   ? Status Partially Met   ? Target Date 10/20/21   ? ?  ?  ? ?  ? ? ? ? ? ? ? ? Plan - 10/06/21 1334   ? ? Clinical Impression Statement Patient motivated and participated well within session. She was instructed in advanced LE strengthening exercise, utilizing ankle weights. She does require UE assist in standing for balance and positioning. Advanced HEP with instruction in standing exercise. Patient does require min VCs for proper positioning/exercise technique. Her daughter was also educated in [place] for better adherence. She would benefit from additional skilled PT intervention to improve LE strength, balance and mobility. Patient also reports feeling increased stiffness in the AM and would benefit from instruction in safe exercise she can do in the bed to help her loosen up in  the AM. However due to kyphoplasty and history of back pain, limit rotation and focus on exercise in sagittal plane;   ? Personal Factors and Comorbidities Age;Time since onset of injury/illness/exacerbation; [employer]

## 2021-10-11 ENCOUNTER — Ambulatory Visit: Payer: Medicare HMO | Attending: Orthopedic Surgery | Admitting: Physical Therapy

## 2021-10-11 ENCOUNTER — Encounter: Payer: Self-pay | Admitting: Physical Therapy

## 2021-10-11 DIAGNOSIS — R2681 Unsteadiness on feet: Secondary | ICD-10-CM | POA: Diagnosis not present

## 2021-10-11 DIAGNOSIS — R269 Unspecified abnormalities of gait and mobility: Secondary | ICD-10-CM | POA: Insufficient documentation

## 2021-10-11 DIAGNOSIS — R262 Difficulty in walking, not elsewhere classified: Secondary | ICD-10-CM | POA: Diagnosis not present

## 2021-10-11 DIAGNOSIS — M6281 Muscle weakness (generalized): Secondary | ICD-10-CM | POA: Insufficient documentation

## 2021-10-11 DIAGNOSIS — R293 Abnormal posture: Secondary | ICD-10-CM | POA: Diagnosis not present

## 2021-10-11 NOTE — Patient Instructions (Signed)
Access Code: [email or web address] ?URL: [email or web address] ?Date: 10/11/2021 ?Prepared by: Blanche East ? ?Exercises ?- Supine Posterior Pelvic Tilt  - 1 x daily - 7 x weekly - 1 sets - 10 reps ?- Hooklying Single Knee to Chest Stretch  - 1 x daily - 7 x weekly - 1 sets - 2-3 reps - 10 sec hold ?- Hooklying Hamstring Stretch  - 1 x daily - 7 x weekly - 1 sets - 5 reps - 5 sec hold ?- Supine Bridge  - 1 x daily - 7 x weekly - 3 sets - 10 reps ?

## 2021-10-11 NOTE — Therapy (Signed)
[place] ?[facility] ?[place], [place], [address] ?Phone: [phone]   Fax:  [phone] ? ?Physical Therapy Treatment ? ?Patient Details  ?Name: April Rogers ?MRN: [id number] ?Date of Birth: [birth year] ?Referring Provider (PT): Duanne Guess, [place] ? ? ?Encounter Date: 10/11/2021 ? ? PT End of Session - 10/11/21 1358   ? ? Visit Number 17   ? Number of Visits 21   ? Date for PT Re-Evaluation 02/72/53   from eval cert  ? Authorization Type Authorized 20 visits 2/8-4/15   ? PT Start Time 1348   ? PT Stop Time 1430   ? PT Time Calculation (min) 42 min   ? Equipment Utilized During Treatment Gait belt   ? Activity Tolerance Patient tolerated treatment well   ? Behavior During Therapy [facility] for tasks assessed/performed   ? ?  ?  ? ?  ? ? ?Past Medical History:  ?Diagnosis Date  ? Anxiety   ? Arthritis   ? Cancer [facility])   ? skin cancer  ? Complication of anesthesia   ? general anesthesia hard on memory after pt goes home  ? Depression   ? Fracture of one rib   ? GERD (gastroesophageal reflux disease)   ? was on nexium but no longer needs to take this medication  ? Hypertension   ? Macular degeneration of both eyes   ? Osteoporosis   ? ? ?Past Surgical History:  ?Procedure Laterality Date  ? ARM SKIN LESION BIOPSY / EXCISION Right   ? CHOLECYSTECTOMY    ? COLONOSCOPY WITH PROPOFOL N/A 08/03/2015  ? Procedure: COLONOSCOPY WITH PROPOFOL;  Surgeon: Hulen Luster, MD;  Location: [facility] ENDOSCOPY;  Service: Gastroenterology;  Laterality: N/A;  ? INTRAMEDULLARY (IM) NAIL INTERTROCHANTERIC Right 11/26/2015  ? Procedure: INTRAMEDULLARY (IM) NAIL INTERTROCHANTRIC;  Surgeon: Earnestine Leys, MD;  Location: [facility] ORS;  Service: Orthopedics;  Laterality: Right;  ? KYPHOPLASTY N/A 08/16/2019  ? Procedure: L4 KYPHOPLASTY;  Surgeon: Hessie Knows, MD;  Location: [facility] ORS;  Service: Orthopedics;  Laterality: N/A;  ? KYPHOPLASTY N/A 08/29/2019  ? Procedure: L2 KYPHOPLASTY;  Surgeon: Hessie Knows, MD;  Location: [facility] ORS;  Service: Orthopedics;  Laterality: N/A;  ? KYPHOPLASTY N/A 10/10/2019  ? Procedure: L3 KYPHOPLASTY;  Surgeon: Hessie Knows, MD;  Location: [facility] ORS;  Service: Orthopedics;  Laterality: N/A;  ? open heart surgery  2019  ? valve replacement, aortic arch  aneurysm repair  ? TONSILLECTOMY    ? TUBAL LIGATION    ? WRIST RECONSTRUCTION Right   ? ? ?There were no vitals filed for this visit. ? ? Subjective Assessment - 10/11/21 1357   ? ? Subjective Patient reports doing well. She was unable to do new exercises at home as she didn't have caregiver help; She denies any pain currently;   ? Pertinent History Pt is a 81 y.o. female referred to PT for gait and imbalance. PMH includes: Anxiety, skin cancer, GERD, HTN, osteoporosis, L2-L4 kyphoplasties 08/29/19, R hip ORIF. Pt reporting for OPPT for gait/imbalance. Was recently receiving OP PT for R shoulder but that has been complete. Pt 's daughter present to assist in history taking. Pt's daughter reports since covid, open heart surgery, recent surgeries causing balance concerns dating from 2019. No falls in last 6 months, 1 fall in the last year. Near falls reported. Pt is a furniture walker, does use rollator in house. Pt endorsing difficulty with vision, obstacle navigation, clearing feet with walking. Home  lay out noted in flow sheets. Pt's goal is to improve balance and strength.   ? Limitations Lifting;Standing;Walking   ? Patient Stated Goals Improve balance and strength.   ? Currently in Pain? No/denies   ? Multiple Pain Sites No   ? ?  ?  ? ?  ? ? ? ?TREATMENT: ?Patient hooklying: ?Posterior pelvic tilt 3 sec hold x10 reps;  ?Single knee to chest stretch 10 sec hold x2 reps each LE; ?Hamstring neural stretch with hip flexed and knee flexed/extended 5 sec hold x5 reps ?Bridge x10 reps; ?Patient required min-moderate verbal/tactile cues for correct exercise technique. ?Provided written HEP for better adherence ?Patient able to exhibit  good log roll transitioning supine to sitting;  ? ? ?Instructed patient in balance exercise: ?Standing on airex pad: ?-feet together: ? Unsupported 30 sec hold ? Progressed to head turns side/side x5 reps ? Unsupported picking up cones/placing cones #3 x2-3 sets with good positioning and tolerance; ?-modified tandem stance: ? Unsupported standing 10 sec hold x1 rep each foot in front ? Progressed to head turns side/side x5 reps each foot in front ?Required min A for safety with increased lateral instability noted with head turns;  ? ?Patient does exhibit increased unsteadiness with narrow base of support. She required min A for safety and steadying;  ? ? ? ? ? ? ? ? ? ? ? ? ? ? ? ? ? ? ? ? ? ? ? ? PT Education - 10/11/21 1357   ? ? Education Details HEP   ? Person(s) Educated Patient   ? Methods Explanation;Verbal cues   ? Comprehension Verbalized understanding;Returned demonstration;Verbal cues required;Need further instruction   ? ?  ?  ? ?  ? ? ? PT Short Term Goals - 09/15/21 1514   ? ?  ? PT SHORT TERM GOAL #1  ? Title Pt will be independent with HEP to improve posture and balance for ADL completion   ? Baseline 08/11/21: initiated; Has been doing her HEP, no questions (09/15/2021)   ? Time 10   ? Period Weeks   ? Status Achieved   ? Target Date 10/20/21   ? ?  ?  ? ?  ? ? ? ? PT Long Term Goals - 09/15/21 1516   ? ?  ? PT LONG TERM GOAL #1  ? Title Pt will improve FOTO to target score of 54 to demonstrate clinically significant improvement in functional mobility   ? Baseline 08/11/21: 43; 51 (09/15/2021)   ? Time 10   ? Period Weeks   ? Status Partially Met   ? Target Date 10/20/21   ?  ? PT LONG TERM GOAL #2  ? Title Pt will improve 5xSTS time by 5 seconds without UE support to demonstrate clinically significant improvement in strength.   ? Baseline 08/11/21: 23.08 sec with UE's on arm rests; 23 seconds with UE's on arm rests (09/15/2021)   ? Time 10   ? Period Weeks   ? Status On-going   ? Target Date 10/20/21   ?  ? PT  LONG TERM GOAL #3  ? Title Pt will improve DGI to 19 to demonstrate clinicially significant improvement in falls reduction with community ambulation tasks.   ? Baseline 08/11/21: 11/24   ? Time 10   ? Period Weeks   ? Status Deferred   ? Target Date 10/20/21   ?  ? PT LONG TERM GOAL #4  ? Title Pt will improve normal walking speed  to > 0.8 m/s to decrease risk of falls with household walking tasks.   ? Baseline 08/11/21: 0.58 m/s; 0.71 m/s with rollator walker (09/15/2021)   ? Time 10   ? Period Weeks   ? Status On-going   ? Target Date 10/20/21   ?  ? PT LONG TERM GOAL #5  ? Title Pt will improve Berg balance by 8 points to display clinically significant improvement in reduced fall risk with ADL's   ? Baseline 08/18/21: 40/56; 43/56 (09/15/2021)   ? Time 10   ? Period Weeks   ? Status Partially Met   ? Target Date 10/20/21   ? ?  ?  ? ?  ? ? ? ? ? ? ? ? Plan - 10/11/21 2143   ? ? Clinical Impression Statement Patient motivated and participated well within session. She was instructed in core/LE stretches to help reduce stiffness in AM and improve mobility. Patient does require min VCs for proper positioning. She denies any increase in pain following stretches. Patient able to exhibit good log roll technique transitioning supine to sitting. Patient instructed in advanced balance tasks. Utilized airex pad to challenge stance control. She does exhibit good balance with bending down and exhibits good body positioning. She continues to be most limited with narrow base of support especially when performing head turns or reaching outside base of support. Patient would benefit from additional skilled PT Intervention to improve strengthening, balance and mobility;   ? Personal Factors and Comorbidities Age;Time since onset of injury/illness/exacerbation;Comorbidity 3+;Past/Current Experience   ? Comorbidities PMH inc;ludes: Anxiety, skin cancer, GERD, HTN, osteoporosis, L2-L4 kyphoplasties 08/29/19, R hip ORIF.   ? Examination-Activity  Limitations Stairs;Bend;Locomotion Level;Stand;Reach Overhead;Carry;Transfers;Squat   ? Examination-Participation Restrictions Shop;Community Activity   ? Stability/Clinical Decision Making Stable/Uncomplic

## 2021-10-12 ENCOUNTER — Ambulatory Visit: Payer: Medicare HMO

## 2021-10-13 ENCOUNTER — Ambulatory Visit: Payer: Medicare HMO | Admitting: Physical Therapy

## 2021-10-13 ENCOUNTER — Encounter: Payer: Self-pay | Admitting: Physical Therapy

## 2021-10-13 DIAGNOSIS — M6281 Muscle weakness (generalized): Secondary | ICD-10-CM | POA: Diagnosis not present

## 2021-10-13 DIAGNOSIS — R293 Abnormal posture: Secondary | ICD-10-CM | POA: Diagnosis not present

## 2021-10-13 DIAGNOSIS — R2681 Unsteadiness on feet: Secondary | ICD-10-CM | POA: Diagnosis not present

## 2021-10-13 DIAGNOSIS — R269 Unspecified abnormalities of gait and mobility: Secondary | ICD-10-CM

## 2021-10-13 DIAGNOSIS — R262 Difficulty in walking, not elsewhere classified: Secondary | ICD-10-CM | POA: Diagnosis not present

## 2021-10-13 NOTE — Therapy (Signed)
[place] ?[facility] ?[place], [place], [address] ?Phone: [phone]   Fax:  [phone] ? ?Physical Therapy Treatment ? ?Patient Details  ?Name: April Rogers ?MRN: [id number] ?Date of Birth: Nov 20, 1940 ?Referring Provider (PT): Duanne Guess, [place] ? ? ?Encounter Date: 10/13/2021 ? ? PT End of Session - 10/13/21 1542   ? ? Visit Number 18   ? Number of Visits 21   ? Date for PT Re-Evaluation 01/03/93   from eval cert  ? Authorization Type Authorized 20 visits 2/8-4/15   ? PT Start Time 8546   ? PT Stop Time 2703   ? PT Time Calculation (min) 44 min   ? Equipment Utilized During Treatment Gait belt   ? Activity Tolerance Patient tolerated treatment well   ? Behavior During Therapy [facility] for tasks assessed/performed   ? ?  ?  ? ?  ? ? ?Past Medical History:  ?Diagnosis Date  ? Anxiety   ? Arthritis   ? Cancer [facility])   ? skin cancer  ? Complication of anesthesia   ? general anesthesia hard on memory after pt goes home  ? Depression   ? Fracture of one rib   ? GERD (gastroesophageal reflux disease)   ? was on nexium but no longer needs to take this medication  ? Hypertension   ? Macular degeneration of both eyes   ? Osteoporosis   ? ? ?Past Surgical History:  ?Procedure Laterality Date  ? ARM SKIN LESION BIOPSY / EXCISION Right   ? CHOLECYSTECTOMY    ? COLONOSCOPY WITH PROPOFOL N/A 08/03/2015  ? Procedure: COLONOSCOPY WITH PROPOFOL;  Surgeon: Hulen Luster, MD;  Location[facility] ENDOSCOPY;  Service: Gastroenterology;  Laterality: N/A;  ? INTRAMEDULLARY (IM) NAIL INTERTROCHANTERIC Right 11/26/2015  ? Procedure: INTRAMEDULLARY (IM) NAIL INTERTROCHANTRIC;  Surgeon: Earnestine Leys, MD;  Location: [facility] ORS;  Service: Orthopedics;  Laterality: Right;  ? KYPHOPLASTY N/A 08/16/2019  ? Procedure: L4 KYPHOPLASTY;  Surgeon: Hessie Knows, MD;  Location: [facility] ORS;  Service: Orthopedics;  Laterality: N/A;  ? KYPHOPLASTY N/A 08/29/2019  ? Procedure: L2 KYPHOPLASTY;  Surgeon: Hessie Knows, MD;  Location: [facility] ORS;  Service: Orthopedics;  Laterality: N/A;  ? KYPHOPLASTY N/A 10/10/2019  ? Procedure: L3 KYPHOPLASTY;  Surgeon: Hessie Knows, MD;  Location: [facility] ORS;  Service: Orthopedics;  Laterality: N/A;  ? open heart surgery  2019  ? valve replacement, aortic arch  aneurysm repair  ? TONSILLECTOMY    ? TUBAL LIGATION    ? WRIST RECONSTRUCTION Right   ? ? ?There were no vitals filed for this visit. ? ? Subjective Assessment - 10/13/21 1356   ? ? Subjective Pt reports she has been walking laps in her 2 car garage. Pt reports her DTR has been looking after her for the past 4 years.   ? Pertinent History Pt is a 81 y.o. female referred to PT for gait and imbalance. PMH includes: Anxiety, skin cancer, GERD, HTN, osteoporosis, L2-L4 kyphoplasties 08/29/19, R hip ORIF. Pt reporting for OPPT for gait/imbalance. Was recently receiving OP PT for R shoulder but that has been complete. Pt 's daughter present to assist in history taking. Pt's daughter reports since covid, open heart surgery, recent surgeries causing balance concerns dating from 2019. No falls in last 6 months, 1 fall in the last year. Near falls reported. Pt is a furniture walker, does use rollator in house. Pt endorsing difficulty with vision, obstacle navigation, clearing feet with  walking. Home lay out noted in flow sheets. Pt's goal is to improve balance and strength.   ? Limitations Lifting;Standing;Walking   ? Patient Stated Goals Improve balance and strength.   ? ?  ?  ? ?  ? ? ? ? ?INTERVENTIONS:  ? ? ? ? ?TREATMENT: ?  ?NMR: At support bar Unless otherwise stated, CGA was provided and gait belt donned in order to ensure pt safety ?The following activities were completed in parallel bars or at balance bar  ? ?1 LE floor 1 LE on 6 inch step  ?- added head turns up and down and left and right x 45 sec each  ?-Cues for attempting without handhold support. ? ?- Stand march on airex pad -required 1 UE Support with 3 sec hold x 20 reps  bilateral.  ?- cues for slow and controlled movements  ? ?-Staggered standing on airex pad - hold 30 sec x 3 each LE.  ? ?-Standing on airex pad:-Feet together-  Unsupported standing - head turns 2 rounds x30 seconds each ? ?Lateral step ups to airex pad x 10 ea with upper extremity assist throughout.  Cues for proper foot clearance. ? ?Heel to toe rocks on 1/2 foam roller x 20 ea with UE assist  ? ?Lateral sidestepping with red Thera-Band around ankles.  X3 lengths of parallel bars.  Cues for large step length and proper hip orientation throughout. ?  ?  ?Education provided throughout session via VC/TC and demonstration to facilitate movement at target joints and correct muscle activation for all testing and exercises performed.  ? ? ? ? ? ? ? ? ? ? ? ? ? ? ? ? ? ? ? ? ? ? ? ? ? PT Education - 10/13/21 1542   ? ? Education Details exercise technique   ? Person(s) Educated Patient   ? Methods Explanation   ? Comprehension Verbalized understanding   ? ?  ?  ? ?  ? ? ? PT Short Term Goals - 09/15/21 1514   ? ?  ? PT SHORT TERM GOAL #1  ? Title Pt will be independent with HEP to improve posture and balance for ADL completion   ? Baseline 08/11/21: initiated; Has been doing her HEP, no questions (09/15/2021)   ? Time 10   ? Period Weeks   ? Status Achieved   ? Target Date 10/20/21   ? ?  ?  ? ?  ? ? ? ? PT Long Term Goals - 09/15/21 1516   ? ?  ? PT LONG TERM GOAL #1  ? Title Pt will improve FOTO to target score of 54 to demonstrate clinically significant improvement in functional mobility   ? Baseline 08/11/21: 43; 51 (09/15/2021)   ? Time 10   ? Period Weeks   ? Status Partially Met   ? Target Date 10/20/21   ?  ? PT LONG TERM GOAL #2  ? Title Pt will improve 5xSTS time by 5 seconds without UE support to demonstrate clinically significant improvement in strength.   ? Baseline 08/11/21: 23.08 sec with UE's on arm rests; 23 seconds with UE's on arm rests (09/15/2021)   ? Time 10   ? Period Weeks   ? Status On-going   ? Target Date  10/20/21   ?  ? PT LONG TERM GOAL #3  ? Title Pt will improve DGI to 19 to demonstrate clinicially significant improvement in falls reduction with community ambulation tasks.   ? Baseline  08/11/21: 11/24   ? Time 10   ? Period Weeks   ? Status Deferred   ? Target Date 10/20/21   ?  ? PT LONG TERM GOAL #4  ? Title Pt will improve normal walking speed to > 0.8 m/s to decrease risk of falls with household walking tasks.   ? Baseline 08/11/21: 0.58 m/s; 0.71 m/s with rollator walker (09/15/2021)   ? Time 10   ? Period Weeks   ? Status On-going   ? Target Date 10/20/21   ?  ? PT LONG TERM GOAL #5  ? Title Pt will improve Berg balance by 8 points to display clinically significant improvement in reduced fall risk with ADL's   ? Baseline 08/18/21: 40/56; 43/56 (09/15/2021)   ? Time 10   ? Period Weeks   ? Status Partially Met   ? Target Date 10/20/21   ? ?  ?  ? ?  ? ? ? ? ? ? ? ? Plan - 10/13/21 1543   ? ? Clinical Impression Statement Patient patient was pleasant, motivated and participated well within session.  Continue with several static and dynamic balance interventions in order to improve patient's balance and fall risk.  Patient had difficulties with attention to task but with minimal cueing patient able to perform all exercises as prescribed.  Patient reports no back pain throughout physical therapy session or back discomfort with any exercises performed.  Patient requires some rest breaks with a order rather infrequent.  Patient will continue to benefit from skilled physical therapy intervention in order to improve her lower extremity strength, balance, and decrease her risk of falls.   ? Personal Factors and Comorbidities Age;Time since onset of injury/illness/exacerbation;Comorbidity 3+;Past/Current Experience   ? Comorbidities PMH inc;ludes: Anxiety, skin cancer, GERD, HTN, osteoporosis, L2-L4 kyphoplasties 08/29/19, R hip ORIF.   ? Examination-Activity Limitations Stairs;Bend;Locomotion Level;Stand;Reach  Overhead;Carry;Transfers;Squat   ? Examination-Participation Restrictions Shop;Community Activity   ? Stability/Clinical Decision Making Stable/Uncomplicated   ? Rehab Potential Fair   ? PT Frequency 2x / week   ? PT Duration

## 2021-10-15 ENCOUNTER — Ambulatory Visit: Payer: Medicare HMO

## 2021-10-18 ENCOUNTER — Ambulatory Visit: Payer: Medicare HMO | Admitting: Physical Therapy

## 2021-10-18 ENCOUNTER — Encounter: Payer: Self-pay | Admitting: Physical Therapy

## 2021-10-18 DIAGNOSIS — M6281 Muscle weakness (generalized): Secondary | ICD-10-CM | POA: Diagnosis not present

## 2021-10-18 DIAGNOSIS — R2681 Unsteadiness on feet: Secondary | ICD-10-CM | POA: Diagnosis not present

## 2021-10-18 DIAGNOSIS — R293 Abnormal posture: Secondary | ICD-10-CM | POA: Diagnosis not present

## 2021-10-18 DIAGNOSIS — R269 Unspecified abnormalities of gait and mobility: Secondary | ICD-10-CM | POA: Diagnosis not present

## 2021-10-18 DIAGNOSIS — R262 Difficulty in walking, not elsewhere classified: Secondary | ICD-10-CM | POA: Diagnosis not present

## 2021-10-18 NOTE — Therapy (Signed)
[facility] ?[place], [place], [address] ?Phone: [phone]   Fax:  [phone] ? ?Physical Therapy Treatment ? ?Patient Details  ?Name: April Rogers ?MRN: [id number] ?Date of Birth: January 02, 1941 ?Referring Provider (PT): Duanne Guess, [place] ? ? ?Encounter Date: 10/18/2021 ? ? PT End of Session - 10/18/21 1436   ? ? Visit Number 19   ? Number of Visits 21   ? Date for PT Re-Evaluation 29/24/46   from eval cert  ? Authorization Type Authorized 20 visits 2/8-4/15   ? PT Start Time 1430   ? PT Stop Time 1515   ? PT Time Calculation (min) 45 min   ? Equipment Utilized During Treatment Gait belt   ? Activity Tolerance Patient tolerated treatment well   ? Behavior During Therapy [facility] for tasks assessed/performed   ? ?  ?  ? ?  ? ? ?Past Medical History:  ?Diagnosis Date  ? Anxiety   ? Arthritis   ? Cancer [facility])   ? skin cancer  ? Complication of anesthesia   ? general anesthesia hard on memory after pt goes home  ? Depression   ? Fracture of one rib   ? GERD (gastroesophageal reflux disease)   ? was on nexium but no longer needs to take this medication  ? Hypertension   ? Macular degeneration of both eyes   ? Osteoporosis   ? ? ?Past Surgical History:  ?Procedure Laterality Date  ? ARM SKIN LESION BIOPSY / EXCISION Right   ? CHOLECYSTECTOMY    ? COLONOSCOPY WITH PROPOFOL N/A 08/03/2015  ? Procedure: COLONOSCOPY WITH PROPOFOL;  Surgeon: Hulen Luster, MD;  Location: [facility] ENDOSCOPY;  Service: Gastroenterology;  Laterality: N/A;  ? INTRAMEDULLARY (IM) NAIL INTERTROCHANTERIC Right 11/26/2015  ? Procedure: INTRAMEDULLARY (IM) NAIL INTERTROCHANTRIC;  Surgeon: Earnestine Leys, MD;  Location: [facility] ORS;  Service: Orthopedics;  Laterality: Right;  ? KYPHOPLASTY N/A 08/16/2019  ? Procedure: L4 KYPHOPLASTY;  Surgeon: Hessie Knows, MD;  Location: [facility] ORS;  Service: Orthopedics;  Laterality: N/A;  ? KYPHOPLASTY N/A 08/29/2019  ? Procedure: L2 KYPHOPLASTY;  Surgeon: Hessie Knows, MD;  Location: [facility] ORS;  Service: Orthopedics;  Laterality: N/A;  ? KYPHOPLASTY N/A 10/10/2019  ? Procedure: L3 KYPHOPLASTY;  Surgeon: Hessie Knows, MD;  Location: [facility] ORS;  Service: Orthopedics;  Laterality: N/A;  ? open heart surgery  2019  ? valve replacement, aortic arch  aneurysm repair  ? TONSILLECTOMY    ? TUBAL LIGATION    ? WRIST RECONSTRUCTION Right   ? ? ?There were no vitals filed for this visit. ? ? Subjective Assessment - 10/18/21 1435   ? ? Subjective Pt reports she had her knee " slip" on her yesterday when she was walking in driveway. Pt reports as she was walking her knee " slipped" and she had to grab onto something to hold her balance.   ? Pertinent History Pt is a 81 y.o. female referred to PT for gait and imbalance. PMH includes: Anxiety, skin cancer, GERD, HTN, osteoporosis, L2-L4 kyphoplasties 08/29/19, R hip ORIF. Pt reporting for OPPT for gait/imbalance. Was recently receiving OP PT for R shoulder but that has been complete. Pt 's daughter present to assist in history taking. Pt's daughter reports since covid, open heart surgery, recent surgeries causing balance concerns dating from 2019. No falls in last 6 months, 1 fall in the last year. Near falls reported. Pt is a furniture walker, does use rollator  in house. Pt endorsing difficulty with vision, obstacle navigation, clearing feet with walking. Home lay out noted in flow sheets. Pt's goal is to improve balance and strength.   ? Limitations Lifting;Standing;Walking   ? Patient Stated Goals Improve balance and strength.   ? ?  ?  ? ?  ? ? ? ? ?TREATMENT: ? ?Therex  ?  ?LAQ: 2 x 10 x 4# AW ea LE- cues for sequencing, no pain noted  ? ?NMR: At support bar Unless otherwise stated, CGA was provided and gait belt donned in order to ensure pt safety ?The following activities were completed in parallel bars or at balance bar  ? ?Airex step ups x 5 reps with B UE, x 15 reps with intermittent UE support, some LE "catching" on airex pad  with step up/ down and usually trailing LE ? ?Heel to toe rocks on 1/2 foam roller x 20 ea with UE assist  ?-To improve recovery from balance with anterior and posterior perturbations ? ?Heel raise x 20 with B UE assist  ?-1-2 sec hold  ?-To improve recovery of balance with perturbation ? ?1 LE airex  1 LE on 6 inch step  ?-x 45 sec with head still ?- added head turns up and down and left and right x 45 sec each  ?-Less need for handhold support this session ? ?Standing march on airex pad -required 1 UE Support with 3 sec hold x 20 reps bilateral.  ?- cues for slow and controlled movements  ? ?  ?  ?Education provided throughout session via VC/TC and demonstration to facilitate movement at target joints and correct muscle activation for all testing and exercises performed.  ? ? ? ? ? ? ? ? ? ? ? ? ? ? ? ? ? ? ? ? ? ? ? ? ? ? ? ? PT Short Term Goals - 09/15/21 1514   ? ?  ? PT SHORT TERM GOAL #1  ? Title Pt will be independent with HEP to improve posture and balance for ADL completion   ? Baseline 08/11/21: initiated; Has been doing her HEP, no questions (09/15/2021)   ? Time 10   ? Period Weeks   ? Status Achieved   ? Target Date 10/20/21   ? ?  ?  ? ?  ? ? ? ? PT Long Term Goals - 09/15/21 1516   ? ?  ? PT LONG TERM GOAL #1  ? Title Pt will improve FOTO to target score of 54 to demonstrate clinically significant improvement in functional mobility   ? Baseline 08/11/21: 43; 51 (09/15/2021)   ? Time 10   ? Period Weeks   ? Status Partially Met   ? Target Date 10/20/21   ?  ? PT LONG TERM GOAL #2  ? Title Pt will improve 5xSTS time by 5 seconds without UE support to demonstrate clinically significant improvement in strength.   ? Baseline 08/11/21: 23.08 sec with UE's on arm rests; 23 seconds with UE's on arm rests (09/15/2021)   ? Time 10   ? Period Weeks   ? Status On-going   ? Target Date 10/20/21   ?  ? PT LONG TERM GOAL #3  ? Title Pt will improve DGI to 19 to demonstrate clinicially significant improvement in falls reduction  with community ambulation tasks.   ? Baseline 08/11/21: 11/24   ? Time 10   ? Period Weeks   ? Status Deferred   ? Target Date  10/20/21   ?  ? PT LONG TERM GOAL #4  ? Title Pt will improve normal walking speed to > 0.8 m/s to decrease risk of falls with household walking tasks.   ? Baseline 08/11/21: 0.58 m/s; 0.71 m/s with rollator walker (09/15/2021)   ? Time 10   ? Period Weeks   ? Status On-going   ? Target Date 10/20/21   ?  ? PT LONG TERM GOAL #5  ? Title Pt will improve Berg balance by 8 points to display clinically significant improvement in reduced fall risk with ADL's   ? Baseline 08/18/21: 40/56; 43/56 (09/15/2021)   ? Time 10   ? Period Weeks   ? Status Partially Met   ? Target Date 10/20/21   ? ?  ?  ? ?  ? ? ? ? ? ? ? ? Plan - 10/18/21 1655   ? ? Clinical Impression Statement Patient was pleasant, motivated, and participated well within session.  Continue to work on dynamic and static balance interventions in order to improve her balance and reduce fall risk.  Patient also progressed with lower extremity strengthening due to complaints of left lower extremity "slipping" over the weekend.  Patient did not report any increase in low back pain with any exercises performed this session.  Patient will continue to benefit from skilled physical therapy in order to improve her lower extremity strength, balance, and decrease her risk of falls.   ? Personal Factors and Comorbidities Age;Time since onset of injury/illness/exacerbation;Comorbidity 3+;Past/Current Experience   ? Comorbidities PMH inc;ludes: Anxiety, skin cancer, GERD, HTN, osteoporosis, L2-L4 kyphoplasties 08/29/19, R hip ORIF.   ? Examination-Activity Limitations Stairs;Bend;Locomotion Level;Stand;Reach Overhead;Carry;Transfers;Squat   ? Examination-Participation Restrictions Shop;Community Activity   ? Stability/Clinical Decision Making Stable/Uncomplicated   ? Rehab Potential Fair   ? PT Frequency 2x / week   ? PT Duration Other (comment)   10 weeks  ? PT  Treatment/Interventions ADLs/Self Care Home Management;DME Instruction;Gait training;Stair training;Functional mobility training;Therapeutic activities;Therapeutic exercise;Balance training;Neuromuscular re-

## 2021-10-19 ENCOUNTER — Ambulatory Visit: Payer: Medicare HMO

## 2021-10-19 DIAGNOSIS — H353122 Nonexudative age-related macular degeneration, left eye, intermediate dry stage: Secondary | ICD-10-CM | POA: Diagnosis not present

## 2021-10-20 ENCOUNTER — Ambulatory Visit: Payer: Medicare HMO

## 2021-10-21 ENCOUNTER — Ambulatory Visit: Payer: Medicare HMO | Admitting: Physical Therapy

## 2021-10-21 ENCOUNTER — Encounter: Payer: Self-pay | Admitting: Physical Therapy

## 2021-10-21 DIAGNOSIS — R262 Difficulty in walking, not elsewhere classified: Secondary | ICD-10-CM | POA: Diagnosis not present

## 2021-10-21 DIAGNOSIS — R269 Unspecified abnormalities of gait and mobility: Secondary | ICD-10-CM

## 2021-10-21 DIAGNOSIS — R293 Abnormal posture: Secondary | ICD-10-CM

## 2021-10-21 DIAGNOSIS — M6281 Muscle weakness (generalized): Secondary | ICD-10-CM | POA: Diagnosis not present

## 2021-10-21 DIAGNOSIS — R2681 Unsteadiness on feet: Secondary | ICD-10-CM | POA: Diagnosis not present

## 2021-10-22 NOTE — Therapy (Signed)
[place] [facility] ?[place], [place], [address] ?Phone: [phone]   Fax:  [phone] ? ?Physical Therapy Treatment ?Physical Therapy Progress Note ? ? ?Dates of reporting period  09/15/21   to   10/21/21 ? ? ?Patient Details  ?Name: April Rogers ?MRN: [id number] ?Date of Birth: 12/09/1940 ?Referring Provider (PT): Duanne Guess, [place] ? ? ?Encounter Date: 10/21/2021 ? ? PT End of Session - 10/22/21 2109   ? ? Visit Number 20   ? Number of Visits 37   ? Date for PT Re-Evaluation 97/35/32   from eval cert  ? Authorization Type Authorized 20 visits 2/8-4/15   ? PT Start Time 1350   ? PT Stop Time 1430   ? PT Time Calculation (min) 40 min   ? Equipment Utilized During Treatment Gait belt   ? Activity Tolerance Patient tolerated treatment well   ? Behavior During Therapy [facility] for tasks assessed/performed   ? ?  ?  ? ?  ? ? ?Past Medical History:  ?Diagnosis Date  ? Anxiety   ? Arthritis   ? Cancer [facility])   ? skin cancer  ? Complication of anesthesia   ? general anesthesia hard on memory after pt goes home  ? Depression   ? Fracture of one rib   ? GERD (gastroesophageal reflux disease)   ? was on nexium but no longer needs to take this medication  ? Hypertension   ? Macular degeneration of both eyes   ? Osteoporosis   ? ? ?Past Surgical History:  ?Procedure Laterality Date  ? ARM SKIN LESION BIOPSY / EXCISION Right   ? CHOLECYSTECTOMY    ? COLONOSCOPY WITH PROPOFOL N/A 08/03/2015  ? Procedure: COLONOSCOPY WITH PROPOFOL;  Surgeon: Hulen Luster, MD;  Location: [facility] ENDOSCOPY;  Service: Gastroenterology;  Laterality: N/A;  ? INTRAMEDULLARY (IM) NAIL INTERTROCHANTERIC Right 11/26/2015  ? Procedure: INTRAMEDULLARY (IM) NAIL INTERTROCHANTRIC;  Surgeon: Earnestine Leys, MD;  Location: [facility] ORS;  Service: Orthopedics;  Laterality: Right;  ? KYPHOPLASTY N/A 08/16/2019  ? Procedure: L4 KYPHOPLASTY;  Surgeon: Hessie Knows, MD;  Location: [facility] ORS;  Service: Orthopedics;   Laterality: N/A;  ? KYPHOPLASTY N/A 08/29/2019  ? Procedure: L2 KYPHOPLASTY;  Surgeon: Hessie Knows, MD;  Location: [facility] ORS;  Service: Orthopedics;  Laterality: N/A;  ? KYPHOPLASTY N/A 10/10/2019  ? Procedure: L3 KYPHOPLASTY;  Surgeon: Hessie Knows, MD;  Location: [facility] ORS;  Service: Orthopedics;  Laterality: N/A;  ? open heart surgery  2019  ? valve replacement, aortic arch  aneurysm repair  ? TONSILLECTOMY    ? TUBAL LIGATION    ? WRIST RECONSTRUCTION Right   ? ? ?There were no vitals filed for this visit. ? ? Subjective Assessment - 10/21/21 1357   ? ? Subjective Patient reports doing well. She went to [facility] and had assessments of vision, she has advanced macular degeneration. Glasses are not appropriate but will be getting a magnifying device which will help for reading and other ADLs. She handled the trip well.   ? Pertinent History Pt is a 81 y.o. female referred to PT for gait and imbalance. PMH includes: Anxiety, skin cancer, GERD, HTN, osteoporosis, L2-L4 kyphoplasties 08/29/19, R hip ORIF. Pt reporting for OPPT for gait/imbalance. Was recently receiving OP PT for R shoulder but that has been complete. Pt 's daughter present to assist in history taking. Pt's daughter reports since covid, open heart surgery, recent surgeries causing balance concerns dating from  2019. No falls in last 6 months, 1 fall in the last year. Near falls reported. Pt is a furniture walker, does use rollator in house. Pt endorsing difficulty with vision, obstacle navigation, clearing feet with walking. Home lay out noted in flow sheets. Pt's goal is to improve balance and strength.   ? Limitations Lifting;Standing;Walking   ? Patient Stated Goals Improve balance and strength.   ? Currently in Pain? No/denies   ? Multiple Pain Sites No   ? ?  ?  ? ?  ? ? ? ? ? [facility] PT Assessment - 10/22/21 0001   ? ?  ? Observation/Other Assessments  ? Focus on Therapeutic Outcomes (FOTO)  48%   ?  ? Standardized Balance Assessment  ?  Standardized Balance Assessment Five Times Sit to Stand;10 meter walk test   ? Five times sit to stand comments  17.07 sec without UE assist, improved from 23 sec with BUE rail assist (>15 sec indicates increased risk for falls)   ? 10 Meter Walk 0.69 m/s with rollator   ?  ? Berg Balance Test  ? Sit to Stand Able to stand without using hands and stabilize independently   ? Standing Unsupported Able to stand safely 2 minutes   ? Sitting with Back Unsupported but Feet Supported on Floor or Stool Able to sit safely and securely 2 minutes   ? Stand to Sit Sits safely with minimal use of hands   ? Transfers Able to transfer safely, minor use of hands   ? Standing Unsupported with Eyes Closed Able to stand 10 seconds safely   ? Standing Unsupported with Feet Together Able to place feet together independently and stand 1 minute safely   ? From Standing, Reach Forward with Outstretched Arm Can reach confidently >25 cm (10")   ? From Standing Position, Pick up Object from [place] to pick up shoe safely and easily   ? From Standing Position, Turn to Look Behind Over each Shoulder Looks behind from both sides and weight shifts well   ? Turn 360 Degrees Able to turn 360 degrees safely but slowly   ? Standing Unsupported, Alternately Place Feet on Step/Stool Able to complete 4 steps without aid or supervision   ? Standing Unsupported, One Foot in Front Able to plae foot ahead of the other independently and hold 30 seconds   ? Standing on One Leg Able to lift leg independently and hold equal to or more than 3 seconds   ? Total Score 49   ? Berg comment: <50% risk for falls   ? ?  ?  ? ?  ? ? ? ? ?TREATMENT: ?Instructed patient in outcome measures, see above ? ?Patient's condition has the potential to improve in response to therapy. Maximum improvement is yet to be obtained. The anticipated improvement is attainable and reasonable in a generally predictable time.  Patient reports adherence with HEP. She reports feeling like  her balance and mobility has improved.  ? ? ? ? ? ? ? ? ? ? ? ? ? ? ? ? ? ? ? ? PT Education - 10/22/21 2109   ? ? Education Details progress towards goals   ? Person(s) Educated Patient   ? Methods Explanation;Verbal cues   ? Comprehension Returned demonstration;Verbal cues required;Verbalized understanding;Need further instruction   ? ?  ?  ? ?  ? ? ? PT Short Term Goals - 10/21/21 1359   ? ?  ? PT SHORT TERM GOAL #  1  ? Title Pt will be independent with HEP to improve posture and balance for ADL completion   ? Baseline 08/11/21: initiated; Has been doing her HEP, no questions (09/15/2021) 4/13: doing well   ? Time 10   ? Period Weeks   ? Status Achieved   ? Target Date 10/20/21   ? ?  ?  ? ?  ? ? ? ? PT Long Term Goals - 10/21/21 1359   ? ?  ? PT LONG TERM GOAL #1  ? Title Pt will improve FOTO to target score of 54 to demonstrate clinically significant improvement in functional mobility   ? Baseline 08/11/21: 43; 51 (09/15/2021)   ? Time 8   ? Period Weeks   ? Status Partially Met   ? Target Date 12/16/21   ?  ? PT LONG TERM GOAL #2  ? Title Pt will improve 5xSTS to <15 sec without UE support to demonstrate clinically significant improvement in strength and reduce fall risk.   ? Baseline 08/11/21: 23.08 sec with UE's on arm rests; 23 seconds with UE's on arm rests (09/15/2021), 4/13: 17.07 sec without UE assist;   ? Time 8   ? Period Weeks   ? Status Revised   ? Target Date 12/16/21   ?  ? PT LONG TERM GOAL #3  ? Title Pt will improve DGI to 19 to demonstrate clinicially significant improvement in falls reduction with community ambulation tasks.   ? Baseline 08/11/21: 11/24, 4/13: deferred due to time   ? Time 8   ? Period Weeks   ? Status Deferred   ? Target Date 12/16/21   ?  ? PT LONG TERM GOAL #4  ? Title Pt will improve normal walking speed to > 0.8 m/s to decrease risk of falls with household walking tasks.   ? Baseline 08/11/21: 0.58 m/s; 0.71 m/s with rollator walker (09/15/2021), 4/13: 0.69 m/s (regular pace);   ? Time 8    ? Period Weeks   ? Status On-going   ? Target Date 12/16/21   ?  ? PT LONG TERM GOAL #5  ? Title Pt will improve Berg balance by 8 points to display clinically significant improvement in reduced fall risk with ADL's   ? B

## 2021-10-25 ENCOUNTER — Ambulatory Visit: Payer: Medicare HMO

## 2021-10-25 DIAGNOSIS — R262 Difficulty in walking, not elsewhere classified: Secondary | ICD-10-CM

## 2021-10-25 DIAGNOSIS — M6281 Muscle weakness (generalized): Secondary | ICD-10-CM | POA: Diagnosis not present

## 2021-10-25 DIAGNOSIS — R269 Unspecified abnormalities of gait and mobility: Secondary | ICD-10-CM

## 2021-10-25 DIAGNOSIS — R293 Abnormal posture: Secondary | ICD-10-CM | POA: Diagnosis not present

## 2021-10-25 DIAGNOSIS — M810 Age-related osteoporosis without current pathological fracture: Secondary | ICD-10-CM | POA: Diagnosis not present

## 2021-10-25 DIAGNOSIS — R2681 Unsteadiness on feet: Secondary | ICD-10-CM | POA: Diagnosis not present

## 2021-10-25 NOTE — Therapy (Signed)
[facility] ?[place], [place], [address] ?Phone: [phone]   Fax:  [phone] ? ?Physical Therapy Treatment ? ?Patient Details  ?Name: April Rogers ?MRN: [id number] ?Date of Birth: [birth year] ?Referring Provider (PT): Duanne Guess, [place] ? ? ?Encounter Date: 10/25/2021 ? ? PT End of Session - 10/25/21 1358   ? ? Visit Number 21   ? Number of Visits 37   ? Date for PT Re-Evaluation 12/17/21   ? Authorization Type [employer]   ? Authorization Time Period 10/21/21-01/07/22 for 20 visits   ? Authorization - Visit Number 2   ? Authorization - Number of Visits 20   ? Progress Note Due on Visit 30   ? PT Start Time 1346   ? PT Stop Time 1426   ? PT Time Calculation (min) 40 min   ? Equipment Utilized During Treatment Gait belt   ? Activity Tolerance Patient tolerated treatment well;No increased pain;Patient limited by fatigue   ? Behavior During Therapy [facility] for tasks assessed/performed   ? ?  ?  ? ?  ? ? ?Past Medical History:  ?Diagnosis Date  ? Anxiety   ? Arthritis   ? Cancer [facility])   ? skin cancer  ? Complication of anesthesia   ? general anesthesia hard on memory after pt goes home  ? Depression   ? Fracture of one rib   ? GERD (gastroesophageal reflux disease)   ? was on nexium but no longer needs to take this medication  ? Hypertension   ? Macular degeneration of both eyes   ? Osteoporosis   ? ? ?Past Surgical History:  ?Procedure Laterality Date  ? ARM SKIN LESION BIOPSY / EXCISION Right   ? CHOLECYSTECTOMY    ? COLONOSCOPY WITH PROPOFOL N/A 08/03/2015  ? Procedure: COLONOSCOPY WITH PROPOFOL;  Surgeon: Hulen Luster, MD;  Location: [facility] ENDOSCOPY;  Service: Gastroenterology;  Laterality: N/A;  ? INTRAMEDULLARY (IM) NAIL INTERTROCHANTERIC Right 11/26/2015  ? Procedure: INTRAMEDULLARY (IM) NAIL INTERTROCHANTRIC;  Surgeon: Earnestine Leys, MD;  Location: [facility] ORS;  Service: Orthopedics;  Laterality: Right;  ? KYPHOPLASTY N/A 08/16/2019  ? Procedure: L4  KYPHOPLASTY;  Surgeon: Hessie Knows, MD;  Location: [facility] ORS;  Service: Orthopedics;  Laterality: N/A;  ? KYPHOPLASTY N/A 08/29/2019  ? Procedure: L2 KYPHOPLASTY;  Surgeon: Hessie Knows, MD;  Location: [facility] ORS;  Service: Orthopedics;  Laterality: N/A;  ? KYPHOPLASTY N/A 10/10/2019  ? Procedure: L3 KYPHOPLASTY;  Surgeon: Hessie Knows, MD;  Location: [facility] ORS;  Service: Orthopedics;  Laterality: N/A;  ? open heart surgery  2019  ? valve replacement, aortic arch  aneurysm repair  ? TONSILLECTOMY    ? TUBAL LIGATION    ? WRIST RECONSTRUCTION Right   ? ? ?There were no vitals filed for this visit. ? ? Subjective Assessment - 10/25/21 1352   ? ? Subjective Pt doing well today, no updates since prior visit. She had a DEXA this morning just before lunch.   ? Pertinent History Pt is a 81 y.o. female referred to PT for gait and imbalance. PMH includes: Anxiety, skin cancer, GERD, HTN, osteoporosis, L2-L4 kyphoplasties 08/29/19, R hip ORIF. Pt reporting for OPPT for gait/imbalance. Was recently receiving OP PT for R shoulder but that has been complete. Pt 's daughter present to assist in history taking. Pt's daughter reports since covid, open heart surgery, recent surgeries causing balance concerns dating from 2019. No falls in last 6 months, 1 fall  in the last year. Near falls reported. Pt is a furniture walker, does use rollator in house. Pt endorsing difficulty with vision, obstacle navigation, clearing feet with walking. Home lay out noted in flow sheets. Pt's goal is to improve balance and strength.   ? Patient Stated Goals Improve balance and strength.   ? Currently in Pain? No/denies   ? ?  ?  ? ?  ? ? ? ?INTERVENTION THIS DATE:  ?-LAQ: 1x10 x 4# AW ea LE- ?-Seated Marching c 4lb AW  ?-Seated RTB clam 1x10  ?-LAQ: 1x10 x 4# AW ea LE- ?-Seated Marching c 4lb AW  ?-Seated RTB clam 1x10  ? ?-STS from chair hands free 1x10  ? ?-AMB overground, 585ft 4WW (no LOB, able to converse throughout) 5:25 ?-AMB overground without  device (unable to for time)  ? ? ? ? PT Education - 10/25/21 1359   ? ? Education Details exercise form correctness   ? Person(s) Educated Patient   ? Methods Explanation   ? Comprehension Verbalized understanding   ? ?  ?  ? ?  ? ? ? PT Short Term Goals - 10/21/21 1359   ? ?  ? PT SHORT TERM GOAL #1  ? Title Pt will be independent with HEP to improve posture and balance for ADL completion   ? Baseline 08/11/21: initiated; Has been doing her HEP, no questions (09/15/2021) 4/13: doing well   ? Time 10   ? Period Weeks   ? Status Achieved   ? Target Date 10/20/21   ? ?  ?  ? ?  ? ? ? ? PT Long Term Goals - 10/21/21 1359   ? ?  ? PT LONG TERM GOAL #1  ? Title Pt will improve FOTO to target score of 54 to demonstrate clinically significant improvement in functional mobility   ? Baseline 08/11/21: 43; 51 (09/15/2021)   ? Time 8   ? Period Weeks   ? Status Partially Met   ? Target Date 12/16/21   ?  ? PT LONG TERM GOAL #2  ? Title Pt will improve 5xSTS to <15 sec without UE support to demonstrate clinically significant improvement in strength and reduce fall risk.   ? Baseline 08/11/21: 23.08 sec with UE's on arm rests; 23 seconds with UE's on arm rests (09/15/2021), 4/13: 17.07 sec without UE assist;   ? Time 8   ? Period Weeks   ? Status Revised   ? Target Date 12/16/21   ?  ? PT LONG TERM GOAL #3  ? Title Pt will improve DGI to 19 to demonstrate clinicially significant improvement in falls reduction with community ambulation tasks.   ? Baseline 08/11/21: 11/24, 4/13: deferred due to time   ? Time 8   ? Period Weeks   ? Status Deferred   ? Target Date 12/16/21   ?  ? PT LONG TERM GOAL #4  ? Title Pt will improve normal walking speed to > 0.8 m/s to decrease risk of falls with household walking tasks.   ? Baseline 08/11/21: 0.58 m/s; 0.71 m/s with rollator walker (09/15/2021), 4/13: 0.69 m/s (regular pace);   ? Time 8   ? Period Weeks   ? Status On-going   ? Target Date 12/16/21   ?  ? PT LONG TERM GOAL #5  ? Title Pt will improve Berg  balance by 8 points to display clinically significant improvement in reduced fall risk with ADL's   ? Baseline 08/18/21: 40/56;  43/56 (09/15/2021), 4/13: 49/56   ? Time 8   ? Period Weeks   ? Status Achieved   ? Target Date 12/16/21   ? ?  ?  ? ?  ? ? ? ? ? ? ? ? Plan - 10/25/21 1359   ? ? Clinical Impression Statement Continued with generalized BLE strengthening. Still working on standing stability and motor control. Pt takes time to catch author up on all her medical updates in great detail. Pt tolerates session well in general, no significant fatigue limitations.   ? Personal Factors and Comorbidities Age;Time since onset of injury/illness/exacerbation;Comorbidity 3+;Past/Current Experience   ? Comorbidities PMH inc;ludes: Anxiety, skin cancer, GERD, HTN, osteoporosis, L2-L4 kyphoplasties 08/29/19, R hip ORIF.   ? Examination-Activity Limitations Stairs;Bend;Locomotion Level;Stand;Reach Overhead;Carry;Transfers;Squat   ? Examination-Participation Restrictions Shop;Community Activity   ? Stability/Clinical Decision Making Stable/Uncomplicated   ? Clinical Decision Making Low   ? Rehab Potential Fair   ? PT Frequency 2x / week   ? PT Duration 8 weeks   ? PT Treatment/Interventions ADLs/Self Care Home Management;DME Instruction;Gait training;Stair training;Functional mobility training;Therapeutic activities;Therapeutic exercise;Balance training;Neuromuscular re-education;Patient/family education   ? PT Next Visit Plan Progress balance as appropriate next session.   ? PT Home Exercise Plan Access Code [email or web address] with scap retractions for posture   ? Consulted and Agree with Plan of Care Patient;Family member/caregiver   ? Family Member Consulted Daughter   ? ?  ?  ? ?  ? ? ?Patient will benefit from skilled therapeutic intervention in order to improve the following deficits and impairments:  Abnormal gait, Improper body mechanics, Decreased mobility, Postural dysfunction, Decreased activity tolerance, Decreased balance,  Decreased strength, Difficulty walking ? ?Visit Diagnosis: ?Abnormality of gait and mobility ? ?Difficulty in walking, not elsewhere classified ? ?Muscle weakness (generalized) ? ?Abnormal posture ? ? ? ? ?Pr

## 2021-10-26 ENCOUNTER — Ambulatory Visit: Payer: Medicare HMO | Admitting: Physical Therapy

## 2021-10-27 ENCOUNTER — Ambulatory Visit: Payer: Medicare HMO

## 2021-10-27 DIAGNOSIS — R293 Abnormal posture: Secondary | ICD-10-CM | POA: Diagnosis not present

## 2021-10-27 DIAGNOSIS — M6281 Muscle weakness (generalized): Secondary | ICD-10-CM

## 2021-10-27 DIAGNOSIS — R269 Unspecified abnormalities of gait and mobility: Secondary | ICD-10-CM | POA: Diagnosis not present

## 2021-10-27 DIAGNOSIS — R262 Difficulty in walking, not elsewhere classified: Secondary | ICD-10-CM

## 2021-10-27 DIAGNOSIS — R2681 Unsteadiness on feet: Secondary | ICD-10-CM | POA: Diagnosis not present

## 2021-10-27 NOTE — Therapy (Signed)
[place] [facility] ?[place], [place], [address] ?Phone: [phone]   Fax:  [phone] ? ?Physical Therapy Treatment ? ?Patient Details  ?Name: April Rogers ?MRN: [id number] ?Date of Birth: 04/19/1941 ?Referring Provider (PT): Duanne Guess, [place] ? ? ?Encounter Date: 10/27/2021 ? ? PT End of Session - 10/27/21 1436   ? ? Visit Number 22   ? Number of Visits 37   ? Date for PT Re-Evaluation 12/17/21   ? Authorization Type [employer]   ? Authorization Time Period 10/21/21-01/07/22 for 20 visits   ? Authorization - Visit Number 2   ? Authorization - Number of Visits 20   ? Progress Note Due on Visit 30   ? PT Start Time 1430   ? PT Stop Time 6568   ? PT Time Calculation (min) 44 min   ? Equipment Utilized During Treatment Gait belt   ? Activity Tolerance Patient tolerated treatment well;No increased pain;Patient limited by fatigue   ? Behavior During Therapy [facility] for tasks assessed/performed   ? ?  ?  ? ?  ? ? ?Past Medical History:  ?Diagnosis Date  ? Anxiety   ? Arthritis   ? Cancer [facility])   ? skin cancer  ? Complication of anesthesia   ? general anesthesia hard on memory after pt goes home  ? Depression   ? Fracture of one rib   ? GERD (gastroesophageal reflux disease)   ? was on nexium but no longer needs to take this medication  ? Hypertension   ? Macular degeneration of both eyes   ? Osteoporosis   ? ? ?Past Surgical History:  ?Procedure Laterality Date  ? ARM SKIN LESION BIOPSY / EXCISION Right   ? CHOLECYSTECTOMY    ? COLONOSCOPY WITH PROPOFOL N/A 08/03/2015  ? Procedure: COLONOSCOPY WITH PROPOFOL;  Surgeon: Hulen Luster, MD;  Location[facility] ENDOSCOPY;  Service: Gastroenterology;  Laterality: N/A;  ? INTRAMEDULLARY (IM) NAIL INTERTROCHANTERIC Right 11/26/2015  ? Procedure: INTRAMEDULLARY (IM) NAIL INTERTROCHANTRIC;  Surgeon: Earnestine Leys, MD;  Location: [facility] ORS;  Service: Orthopedics;  Laterality: Right;  ? KYPHOPLASTY N/A 08/16/2019  ? Procedure: L4  KYPHOPLASTY;  Surgeon: Hessie Knows, MD;  Location: [facility] ORS;  Service: Orthopedics;  Laterality: N/A;  ? KYPHOPLASTY N/A 08/29/2019  ? Procedure: L2 KYPHOPLASTY;  Surgeon: Hessie Knows, MD;  Location: [facility] ORS;  Service: Orthopedics;  Laterality: N/A;  ? KYPHOPLASTY N/A 10/10/2019  ? Procedure: L3 KYPHOPLASTY;  Surgeon: Hessie Knows, MD;  Location: [facility] ORS;  Service: Orthopedics;  Laterality: N/A;  ? open heart surgery  2019  ? valve replacement, aortic arch  aneurysm repair  ? TONSILLECTOMY    ? TUBAL LIGATION    ? WRIST RECONSTRUCTION Right   ? ? ?There were no vitals filed for this visit. ? ? Subjective Assessment - 10/27/21 1346   ? ? Subjective Patient reports feeling okay today. "I believe the last PT said we are going to see how I do walking without an assistive device."   ? Pertinent History Pt is a 81 y.o. female referred to PT for gait and imbalance. PMH includes: Anxiety, skin cancer, GERD, HTN, osteoporosis, L2-L4 kyphoplasties 08/29/19, R hip ORIF. Pt reporting for OPPT for gait/imbalance. Was recently receiving OP PT for R shoulder but that has been complete. Pt 's daughter present to assist in history taking. Pt's daughter reports since covid, open heart surgery, recent surgeries causing balance concerns dating from 2019. No falls  in last 6 months, 1 fall in the last year. Near falls reported. Pt is a furniture walker, does use rollator in house. Pt endorsing difficulty with vision, obstacle navigation, clearing feet with walking. Home lay out noted in flow sheets. Pt's goal is to improve balance and strength.   ? Patient Stated Goals Improve balance and strength.   ? Currently in Pain? No/denies   ? ?  ?  ? ?  ? ?INTERVENTIONS:  ? ? ?Therapeutic Exercises:  ? ? ?Seated hip march 4 lb- VC for slow musle control- hold 2 sec x 12 reps ?Seated knee ext lb- VC for slow muscle control- Hold 2 sec x 12 reps ? ?- Ambulation in clinic without device - 150 feet  in 2 min 8 sec with shuffling steps and  excessive Forward flexed posture- No LOB- flat foot contact with minimal Heel strike.  ? ?  ?-On 2nd trial- After cues for erect posture and longer step length- patient ambulated another 150 feet in 1 min 6 sec- much improved step length and posture.  ? ?Seated hamstring curl with GTB x 15 reps BLE ? ?-Sit to stand without UE - VC to remind not to use UE support x 10 reps x 2 sets  - Repetitive VC. ? ?Education provided throughout session via VC/TC and demonstration to facilitate movement at target joints and correct muscle activation for all testing and exercises performed.  ? ? ? ? ? ? ? ? ? ? ? ? ? ? ? ? ? ? ? ? PT Education - 10/28/21 1349   ? ? Education Details Exercise technique   ? Person(s) Educated Patient   ? Methods Explanation;Demonstration;Tactile cues;Verbal cues   ? Comprehension Verbalized understanding;Returned demonstration;Verbal cues required;Tactile cues required;Need further instruction   ? ?  ?  ? ?  ? ? ? PT Short Term Goals - 10/21/21 1359   ? ?  ? PT SHORT TERM GOAL #1  ? Title Pt will be independent with HEP to improve posture and balance for ADL completion   ? Baseline 08/11/21: initiated; Has been doing her HEP, no questions (09/15/2021) 4/13: doing well   ? Time 10   ? Period Weeks   ? Status Achieved   ? Target Date 10/20/21   ? ?  ?  ? ?  ? ? ? ? PT Long Term Goals - 10/21/21 1359   ? ?  ? PT LONG TERM GOAL #1  ? Title Pt will improve FOTO to target score of 54 to demonstrate clinically significant improvement in functional mobility   ? Baseline 08/11/21: 43; 51 (09/15/2021)   ? Time 8   ? Period Weeks   ? Status Partially Met   ? Target Date 12/16/21   ?  ? PT LONG TERM GOAL #2  ? Title Pt will improve 5xSTS to <15 sec without UE support to demonstrate clinically significant improvement in strength and reduce fall risk.   ? Baseline 08/11/21: 23.08 sec with UE's on arm rests; 23 seconds with UE's on arm rests (09/15/2021), 4/13: 17.07 sec without UE assist;   ? Time 8   ? Period Weeks   ?  Status Revised   ? Target Date 12/16/21   ?  ? PT LONG TERM GOAL #3  ? Title Pt will improve DGI to 19 to demonstrate clinicially significant improvement in falls reduction with community ambulation tasks.   ? Baseline 08/11/21: 11/24, 4/13: deferred due to time   ? Time  8   ? Period Weeks   ? Status Deferred   ? Target Date 12/16/21   ?  ? PT LONG TERM GOAL #4  ? Title Pt will improve normal walking speed to > 0.8 m/s to decrease risk of falls with household walking tasks.   ? Baseline 08/11/21: 0.58 m/s; 0.71 m/s with rollator walker (09/15/2021), 4/13: 0.69 m/s (regular pace);   ? Time 8   ? Period Weeks   ? Status On-going   ? Target Date 12/16/21   ?  ? PT LONG TERM GOAL #5  ? Title Pt will improve Berg balance by 8 points to display clinically significant improvement in reduced fall risk with ADL's   ? Baseline 08/18/21: 40/56; 43/56 (09/15/2021), 4/13: 49/56   ? Time 8   ? Period Weeks   ? Status Achieved   ? Target Date 12/16/21   ? ?  ?  ? ?  ? ? ? ? ? ? ? ? Plan - 10/27/21 1437   ? ? Clinical Impression Statement Patient presents with good motivation today. Able to accept VC for correct sequencing with gait for much improved gait efficiency. Patient fatigued quickly yet with rest breaks able to complete sets. Patient will continue to benefit from skilled physical therapy in order to improve her lower extremity strength, balance, and decrease her risk of falls.   ? Personal Factors and Comorbidities Age;Time since onset of injury/illness/exacerbation;Comorbidity 3+;Past/Current Experience   ? Comorbidities PMH inc;ludes: Anxiety, skin cancer, GERD, HTN, osteoporosis, L2-L4 kyphoplasties 08/29/19, R hip ORIF.   ? Examination-Activity Limitations Stairs;Bend;Locomotion Level;Stand;Reach Overhead;Carry;Transfers;Squat   ? Examination-Participation Restrictions Shop;Community Activity   ? Stability/Clinical Decision Making Stable/Uncomplicated   ? Rehab Potential Fair   ? PT Frequency 2x / week   ? PT Duration 8 weeks   ?  PT Treatment/Interventions ADLs/Self Care Home Management;DME Instruction;Gait training;Stair training;Functional mobility training;Therapeutic activities;Therapeutic exercise;Balance training;Neuromuscular re-e

## 2021-10-28 ENCOUNTER — Ambulatory Visit: Payer: Medicare HMO | Admitting: Physical Therapy

## 2021-11-01 ENCOUNTER — Ambulatory Visit: Payer: Medicare HMO

## 2021-11-01 DIAGNOSIS — R262 Difficulty in walking, not elsewhere classified: Secondary | ICD-10-CM | POA: Diagnosis not present

## 2021-11-01 DIAGNOSIS — R293 Abnormal posture: Secondary | ICD-10-CM | POA: Diagnosis not present

## 2021-11-01 DIAGNOSIS — R2681 Unsteadiness on feet: Secondary | ICD-10-CM | POA: Diagnosis not present

## 2021-11-01 DIAGNOSIS — M6281 Muscle weakness (generalized): Secondary | ICD-10-CM

## 2021-11-01 DIAGNOSIS — R269 Unspecified abnormalities of gait and mobility: Secondary | ICD-10-CM | POA: Diagnosis not present

## 2021-11-01 NOTE — Therapy (Signed)
[place] [facility] ?[place], [place], [address] ?Phone: [phone]   Fax:  [phone] ? ?Physical Therapy Treatment ? ?Patient Details  ?Name: April Rogers ?MRN: [id number] ?Date of Birth: June 19, 1941 ?Referring Provider (PT): Duanne Guess, [place] ? ? ?Encounter Date: 11/01/2021 ? ? PT End of Session - 11/01/21 1650   ? ? Visit Number 23   ? Number of Visits 37   ? Date for PT Re-Evaluation 12/17/21   ? Authorization Type [employer]   ? Authorization Time Period 10/21/21-01/07/22 for 20 visits   ? Authorization - Visit Number 2   ? Authorization - Number of Visits 20   ? Progress Note Due on Visit 30   ? PT Start Time 1351   ? PT Stop Time 1430   ? PT Time Calculation (min) 39 min   ? Equipment Utilized During Treatment Gait belt   ? Activity Tolerance Patient tolerated treatment well;No increased pain;Patient limited by fatigue   ? Behavior During Therapy [facility] for tasks assessed/performed   ? ?  ?  ? ?  ? ? ?Past Medical History:  ?Diagnosis Date  ? Anxiety   ? Arthritis   ? Cancer [facility])   ? skin cancer  ? Complication of anesthesia   ? general anesthesia hard on memory after pt goes home  ? Depression   ? Fracture of one rib   ? GERD (gastroesophageal reflux disease)   ? was on nexium but no longer needs to take this medication  ? Hypertension   ? Macular degeneration of both eyes   ? Osteoporosis   ? ? ?Past Surgical History:  ?Procedure Laterality Date  ? ARM SKIN LESION BIOPSY / EXCISION Right   ? CHOLECYSTECTOMY    ? COLONOSCOPY WITH PROPOFOL N/A 08/03/2015  ? Procedure: COLONOSCOPY WITH PROPOFOL;  Surgeon: Hulen Luster, MD;  Location: [facility] ENDOSCOPY;  Service: Gastroenterology;  Laterality: N/A;  ? INTRAMEDULLARY (IM) NAIL INTERTROCHANTERIC Right 11/26/2015  ? Procedure: INTRAMEDULLARY (IM) NAIL INTERTROCHANTRIC;  Surgeon: Earnestine Leys, MD;  Location: [facility] ORS;  Service: Orthopedics;  Laterality: Right;  ? KYPHOPLASTY N/A 08/16/2019  ? Procedure: L4  KYPHOPLASTY;  Surgeon: Hessie Knows, MD;  Location: [facility] ORS;  Service: Orthopedics;  Laterality: N/A;  ? KYPHOPLASTY N/A 08/29/2019  ? Procedure: L2 KYPHOPLASTY;  Surgeon: Hessie Knows, MD;  Location: [facility] ORS;  Service: Orthopedics;  Laterality: N/A;  ? KYPHOPLASTY N/A 10/10/2019  ? Procedure: L3 KYPHOPLASTY;  Surgeon: Hessie Knows, MD;  Location: [facility] ORS;  Service: Orthopedics;  Laterality: N/A;  ? open heart surgery  2019  ? valve replacement, aortic arch  aneurysm repair  ? TONSILLECTOMY    ? TUBAL LIGATION    ? WRIST RECONSTRUCTION Right   ? ? ?There were no vitals filed for this visit. ? ? Subjective Assessment - 11/01/21 1354   ? ? Subjective Pt reports no aches, pains. Pt denies LOB/falls.   ? Pertinent History Pt is a 81 y.o. female referred to PT for gait and imbalance. PMH includes: Anxiety, skin cancer, GERD, HTN, osteoporosis, L2-L4 kyphoplasties 08/29/19, R hip ORIF. Pt reporting for OPPT for gait/imbalance. Was recently receiving OP PT for R shoulder but that has been complete. Pt 's daughter present to assist in history taking. Pt's daughter reports since covid, open heart surgery, recent surgeries causing balance concerns dating from 2019. No falls in last 6 months, 1 fall in the last year. Near falls reported. Pt is a  furniture walker, does use rollator in house. Pt endorsing difficulty with vision, obstacle navigation, clearing feet with walking. Home lay out noted in flow sheets. Pt's goal is to improve balance and strength.   ? Limitations Lifting;Standing;Walking   ? Patient Stated Goals Improve balance and strength.   ? Currently in Pain? No/denies   ? ?  ?  ? ?  ? ?INTERVENTIONS:  ?  ?  ?Therapeutic Exercises:  ?  ?-Sit to stand performed hands-free: ?10x; pt rates easy-medium ?10x 2 sets with 1000 gr ball  ?  ?Seated hip march 5 lb hold 2 sec x 12 reps each LE x 2 sets. Pt rates easy-medium ? ?Seated knee ext 5 lb- Hold 2 sec x 12 reps x 2 sets ? ?Seated hamstring curl with GTB x 20 reps  BLE ? ?Gait training: ?- Ambulation in clinic without device, with CGA - 2x148 feet, 1x60 ft focus on heel-toe pattern, addition of dual cog task with last two sets. Very challenging for pt to maintain correct gait mechanics with dual task. Pt requests rest break following second set. SPO2% 97%, HR 73 bpm ? ? ?Education provided throughout session via VC/TC and demonstration to facilitate movement at target joints and correct muscle activation for all testing and exercises performed.  ?  ? PT Education - 11/01/21 1650   ? ? Education Details exercise technique, body mechanics   ? Person(s) Educated Patient   ? Methods Explanation;Demonstration;Verbal cues   ? Comprehension Verbalized understanding;Returned demonstration;Verbal cues required;Need further instruction   ? ?  ?  ? ?  ? ? ? PT Short Term Goals - 10/21/21 1359   ? ?  ? PT SHORT TERM GOAL #1  ? Title Pt will be independent with HEP to improve posture and balance for ADL completion   ? Baseline 08/11/21: initiated; Has been doing her HEP, no questions (09/15/2021) 4/13: doing well   ? Time 10   ? Period Weeks   ? Status Achieved   ? Target Date 10/20/21   ? ?  ?  ? ?  ? ? ? ? PT Long Term Goals - 10/21/21 1359   ? ?  ? PT LONG TERM GOAL #1  ? Title Pt will improve FOTO to target score of 54 to demonstrate clinically significant improvement in functional mobility   ? Baseline 08/11/21: 43; 51 (09/15/2021)   ? Time 8   ? Period Weeks   ? Status Partially Met   ? Target Date 12/16/21   ?  ? PT LONG TERM GOAL #2  ? Title Pt will improve 5xSTS to <15 sec without UE support to demonstrate clinically significant improvement in strength and reduce fall risk.   ? Baseline 08/11/21: 23.08 sec with UE's on arm rests; 23 seconds with UE's on arm rests (09/15/2021), 4/13: 17.07 sec without UE assist;   ? Time 8   ? Period Weeks   ? Status Revised   ? Target Date 12/16/21   ?  ? PT LONG TERM GOAL #3  ? Title Pt will improve DGI to 19 to demonstrate clinicially significant  improvement in falls reduction with community ambulation tasks.   ? Baseline 08/11/21: 11/24, 4/13: deferred due to time   ? Time 8   ? Period Weeks   ? Status Deferred   ? Target Date 12/16/21   ?  ? PT LONG TERM GOAL #4  ? Title Pt will improve normal walking speed to > 0.8 m/s to decrease  risk of falls with household walking tasks.   ? Baseline 08/11/21: 0.58 m/s; 0.71 m/s with rollator walker (09/15/2021), 4/13: 0.69 m/s (regular pace);   ? Time 8   ? Period Weeks   ? Status On-going   ? Target Date 12/16/21   ?  ? PT LONG TERM GOAL #5  ? Title Pt will improve Berg balance by 8 points to display clinically significant improvement in reduced fall risk with ADL's   ? Baseline 08/18/21: 40/56; 43/56 (09/15/2021), 4/13: 49/56   ? Time 8   ? Period Weeks   ? Status Achieved   ? Target Date 12/16/21   ? ?  ?  ? ?  ? ? ? ? ? ? ? ? Plan - 11/01/21 1652   ? ? Clinical Impression Statement Pt highly motivated to participate in session. She tolerates interventions fair without pain but with fatigue, particularly with gait intervention. SPO2% and HR assessed due to reports of incrased fatigue and found to be WNL. Pt improved with rest. Otherwise pt was able to progress strength interventions with level of reistance used and with number of reps performed. Pt will benefit from further skilled PT to improve strength, balance, gait and mobility.   ? Personal Factors and Comorbidities Age;Time since onset of injury/illness/exacerbation;Comorbidity 3+;Past/Current Experience   ? Comorbidities PMH inc;ludes: Anxiety, skin cancer, GERD, HTN, osteoporosis, L2-L4 kyphoplasties 08/29/19, R hip ORIF.   ? Examination-Activity Limitations Stairs;Bend;Locomotion Level;Stand;Reach Overhead;Carry;Transfers;Squat   ? Examination-Participation Restrictions Shop;Community Activity   ? Stability/Clinical Decision Making Stable/Uncomplicated   ? Rehab Potential Fair   ? PT Frequency 2x / week   ? PT Duration 8 weeks   ? PT Treatment/Interventions ADLs/Self  Care Home Management;DME Instruction;Gait training;Stair training;Functional mobility training;Therapeutic activities;Therapeutic exercise;Balance training;Neuromuscular re-education;Patient/family education   ?

## 2021-11-02 ENCOUNTER — Ambulatory Visit: Payer: Medicare HMO | Admitting: Physical Therapy

## 2021-11-03 ENCOUNTER — Ambulatory Visit: Payer: Medicare HMO | Admitting: Physical Therapy

## 2021-11-03 ENCOUNTER — Encounter: Payer: Self-pay | Admitting: Physical Therapy

## 2021-11-03 DIAGNOSIS — R269 Unspecified abnormalities of gait and mobility: Secondary | ICD-10-CM | POA: Diagnosis not present

## 2021-11-03 DIAGNOSIS — R262 Difficulty in walking, not elsewhere classified: Secondary | ICD-10-CM | POA: Diagnosis not present

## 2021-11-03 DIAGNOSIS — R2681 Unsteadiness on feet: Secondary | ICD-10-CM

## 2021-11-03 DIAGNOSIS — M6281 Muscle weakness (generalized): Secondary | ICD-10-CM

## 2021-11-03 DIAGNOSIS — R293 Abnormal posture: Secondary | ICD-10-CM | POA: Diagnosis not present

## 2021-11-03 NOTE — Therapy (Signed)
[place] [facility] ?[place], [place], [address] ?Phone: [phone]   Fax:  [phone] ? ?Physical Therapy Treatment ? ?Patient Details  ?Name: April Rogers ?MRN: [id number] ?Date of Birth: 03/03/1941 ?Referring Provider (PT): Duanne Guess, [place] ? ? ?Encounter Date: 11/03/2021 ? ? PT End of Session - 11/04/21 0834   ? ? Visit Number 24   ? Number of Visits 37   ? Date for PT Re-Evaluation 12/17/21   ? Authorization Type [employer]   ? Authorization Time Period 10/21/21-01/07/22 for 20 visits   ? Authorization - Visit Number 2   ? Authorization - Number of Visits 20   ? Progress Note Due on Visit 30   ? PT Start Time 1432   ? PT Stop Time 1515   ? PT Time Calculation (min) 43 min   ? Equipment Utilized During Treatment Gait belt   ? Activity Tolerance Patient tolerated treatment well;No increased pain;Patient limited by fatigue   ? Behavior During Therapy [facility] for tasks assessed/performed   ? ?  ?  ? ?  ? ? ?Past Medical History:  ?Diagnosis Date  ? Anxiety   ? Arthritis   ? Cancer [facility])   ? skin cancer  ? Complication of anesthesia   ? general anesthesia hard on memory after pt goes home  ? Depression   ? Fracture of one rib   ? GERD (gastroesophageal reflux disease)   ? was on nexium but no longer needs to take this medication  ? Hypertension   ? Macular degeneration of both eyes   ? Osteoporosis   ? ? ?Past Surgical History:  ?Procedure Laterality Date  ? ARM SKIN LESION BIOPSY / EXCISION Right   ? CHOLECYSTECTOMY    ? COLONOSCOPY WITH PROPOFOL N/A 08/03/2015  ? Procedure: COLONOSCOPY WITH PROPOFOL;  Surgeon: Hulen Luster, MD;  Location: [facility] ENDOSCOPY;  Service: Gastroenterology;  Laterality: N/A;  ? INTRAMEDULLARY (IM) NAIL INTERTROCHANTERIC Right 11/26/2015  ? Procedure: INTRAMEDULLARY (IM) NAIL INTERTROCHANTRIC;  Surgeon: Earnestine Leys, MD;  Location: [facility] ORS;  Service: Orthopedics;  Laterality: Right;  ? KYPHOPLASTY N/A 08/16/2019  ? Procedure: L4  KYPHOPLASTY;  Surgeon: Hessie Knows, MD;  Location: [facility] ORS;  Service: Orthopedics;  Laterality: N/A;  ? KYPHOPLASTY N/A 08/29/2019  ? Procedure: L2 KYPHOPLASTY;  Surgeon: Hessie Knows, MD;  Location: [facility] ORS;  Service: Orthopedics;  Laterality: N/A;  ? KYPHOPLASTY N/A 10/10/2019  ? Procedure: L3 KYPHOPLASTY;  Surgeon: Hessie Knows, MD;  Location: [facility] ORS;  Service: Orthopedics;  Laterality: N/A;  ? open heart surgery  2019  ? valve replacement, aortic arch  aneurysm repair  ? TONSILLECTOMY    ? TUBAL LIGATION    ? WRIST RECONSTRUCTION Right   ? ? ?There were no vitals filed for this visit. ? ? Subjective Assessment - 11/03/21 1441   ? ? Subjective Pt reports increased soreness in BLE after last session. Her daughter reports she has needed more naps and has been more worn out.   ? Pertinent History Pt is a 81 y.o. female referred to PT for gait and imbalance. PMH includes: Anxiety, skin cancer, GERD, HTN, osteoporosis, L2-L4 kyphoplasties 08/29/19, R hip ORIF. Pt reporting for OPPT for gait/imbalance. Was recently receiving OP PT for R shoulder but that has been complete. Pt 's daughter present to assist in history taking. Pt's daughter reports since covid, open heart surgery, recent surgeries causing balance concerns dating from 2019. No falls in  last 6 months, 1 fall in the last year. Near falls reported. Pt is a furniture walker, does use rollator in house. Pt endorsing difficulty with vision, obstacle navigation, clearing feet with walking. Home lay out noted in flow sheets. Pt's goal is to improve balance and strength.   ? Limitations Lifting;Standing;Walking   ? Patient Stated Goals Improve balance and strength.   ? Currently in Pain? No/denies   ? Multiple Pain Sites No   ? ?  ?  ? ?  ? ? ? ? ? ? ?  ?  ?TREATMENT: ?  ?Instructed patient in balance exercise: ?Standing on airex pad: ?-feet together: ?            Unsupported 30 sec hold ?            Progressed to head turns side/side x5 reps ?-Alternate toe  taps to 6 inch step x10 reps unsupported, min A for safety, required increased time;  ?-progressed to standing one foot on airex, one foot on 6 inch step: ? Unsupported x20 sec hold, minimal instability ? Progressed to lateral head turns side/side x5 reps with CGA for safety; ?-modified tandem stance: ?            Unsupported standing 10 sec hold x1 rep each foot in front ?            Progressed to ball pass side/side x5 reps each foot in front ?Required min A for safety with increased lateral instability noted with reaching farther outside base of support ? ?Ladder drills: ?Forward/backward walking x2 laps with intermittent rail assist- able to walk forward without rail assist but exhibits short step length, requires 2 HHA when backward walking with increased time /effort ?Reports increased difficulty increasing step length, often stepping on ladder rungs ? ?Finished with crosstrainer, BUE/BLE level 2 at comfortable pace, concurrent with moist heat to low back x4 min (unbilled);  ?  ?Patient does exhibit increased unsteadiness with narrow base of support. She required min A for safety and steadying;  ? ? ? ? ? ? ? ? ? ? ? ? ? ? ? ? ? ? ? ? ? ? PT Education - 11/04/21 0834   ? ? Education Details exercise technique/positioning;   ? Person(s) Educated Patient   ? Methods Explanation;Verbal cues   ? Comprehension Verbalized understanding;Returned demonstration;Verbal cues required;Need further instruction   ? ?  ?  ? ?  ? ? ? PT Short Term Goals - 10/21/21 1359   ? ?  ? PT SHORT TERM GOAL #1  ? Title Pt will be independent with HEP to improve posture and balance for ADL completion   ? Baseline 08/11/21: initiated; Has been doing her HEP, no questions (09/15/2021) 4/13: doing well   ? Time 10   ? Period Weeks   ? Status Achieved   ? Target Date 10/20/21   ? ?  ?  ? ?  ? ? ? ? PT Long Term Goals - 10/21/21 1359   ? ?  ? PT LONG TERM GOAL #1  ? Title Pt will improve FOTO to target score of 54 to demonstrate clinically  significant improvement in functional mobility   ? Baseline 08/11/21: 43; 51 (09/15/2021)   ? Time 8   ? Period Weeks   ? Status Partially Met   ? Target Date 12/16/21   ?  ? PT LONG TERM GOAL #2  ? Title Pt will improve 5xSTS to <15 sec without UE support  to demonstrate clinically significant improvement in strength and reduce fall risk.   ? Baseline 08/11/21: 23.08 sec with UE's on arm rests; 23 seconds with UE's on arm rests (09/15/2021), 4/13: 17.07 sec without UE assist;   ? Time 8   ? Period Weeks   ? Status Revised   ? Target Date 12/16/21   ?  ? PT LONG TERM GOAL #3  ? Title Pt will improve DGI to 19 to demonstrate clinicially significant improvement in falls reduction with community ambulation tasks.   ? Baseline 08/11/21: 11/24, 4/13: deferred due to time   ? Time 8   ? Period Weeks   ? Status Deferred   ? Target Date 12/16/21   ?  ? PT LONG TERM GOAL #4  ? Title Pt will improve normal walking speed to > 0.8 m/s to decrease risk of falls with household walking tasks.   ? Baseline 08/11/21: 0.58 m/s; 0.71 m/s with rollator walker (09/15/2021), 4/13: 0.69 m/s (regular pace);   ? Time 8   ? Period Weeks   ? Status On-going   ? Target Date 12/16/21   ?  ? PT LONG TERM GOAL #5  ? Title Pt will improve Berg balance by 8 points to display clinically significant improvement in reduced fall risk with ADL's   ? Baseline 08/18/21: 40/56; 43/56 (09/15/2021), 4/13: 49/56   ? Time 8   ? Period Weeks   ? Status Achieved   ? Target Date 12/16/21   ? ?  ?  ? ?  ? ? ? ? ? ? ? ? Plan - 11/04/21 0834   ? ? Clinical Impression Statement Patient motivated and participated well within session. she reports significant increase in soreness and fatigue after last session. Therefore this session focused on gentle balance exercise and gentle mobiity. Patient does require intermittent rail assist for safety. She also requires CGA to min A throughout session for safety. She exhibits increased unsteadiness with narrow base of support and with dynamic  walking. She had a difficult time increasing step length when negotiating ladder drills. Patient would benefit from additional skilled PT intervention to improve strength, balance and mobility;   ? Personal Fact

## 2021-11-04 ENCOUNTER — Encounter: Payer: Self-pay | Admitting: Physical Therapy

## 2021-11-04 ENCOUNTER — Ambulatory Visit: Payer: Medicare HMO | Admitting: Physical Therapy

## 2021-11-08 ENCOUNTER — Ambulatory Visit: Payer: Medicare HMO | Attending: Orthopedic Surgery | Admitting: Physical Therapy

## 2021-11-08 ENCOUNTER — Encounter: Payer: Self-pay | Admitting: Physical Therapy

## 2021-11-08 DIAGNOSIS — R293 Abnormal posture: Secondary | ICD-10-CM | POA: Insufficient documentation

## 2021-11-08 DIAGNOSIS — M6281 Muscle weakness (generalized): Secondary | ICD-10-CM | POA: Diagnosis not present

## 2021-11-08 DIAGNOSIS — R262 Difficulty in walking, not elsewhere classified: Secondary | ICD-10-CM | POA: Diagnosis not present

## 2021-11-08 DIAGNOSIS — R2681 Unsteadiness on feet: Secondary | ICD-10-CM | POA: Insufficient documentation

## 2021-11-08 DIAGNOSIS — R269 Unspecified abnormalities of gait and mobility: Secondary | ICD-10-CM | POA: Insufficient documentation

## 2021-11-08 NOTE — Therapy (Signed)
[facility] MAIN [facility] SERVICES [address] [place], [place], [address] Phone: [phone]   Fax:  [phone]  Physical Therapy Treatment  Patient Details  Name: April Rogers MRN: [id number] Date of Birth: 12-Jul-1940 Referring Provider (PT): Evon Slack, [place]   Encounter Date: 11/08/2021   PT End of Session - 11/08/21 1311     Visit Number 25    Number of Visits 37    Date for PT Re-Evaluation 12/17/21    Authorization Type [employer]    Authorization Time Period 10/21/21-01/07/22 for 20 visits    Authorization - Visit Number 6    Authorization - Number of Visits 20    Progress Note Due on Visit 30    PT Start Time 1302    PT Stop Time 1345    PT Time Calculation (min) 43 min    Equipment Utilized During Treatment Gait belt    Activity Tolerance Patient tolerated treatment well;No increased pain;Patient limited by fatigue    Behavior During Therapy [facility] for tasks assessed/performed             Past Medical History:  Diagnosis Date   Anxiety    Arthritis    Cancer (HCC)    skin cancer   Complication of anesthesia    general anesthesia hard on memory after pt goes home   Depression    Fracture of one rib    GERD (gastroesophageal reflux disease)    was on nexium but no longer needs to take this medication   Hypertension    Macular degeneration of both eyes    Osteoporosis     Past Surgical History:  Procedure Laterality Date   ARM SKIN LESION BIOPSY / EXCISION Right    CHOLECYSTECTOMY     COLONOSCOPY WITH PROPOFOL N/A 08/03/2015   Procedure: COLONOSCOPY WITH PROPOFOL;  Surgeon: Wallace Cullens, MD;  Location: [facility] ENDOSCOPY;  Service: Gastroenterology;  Laterality: N/A;   INTRAMEDULLARY (IM) NAIL INTERTROCHANTERIC Right 11/26/2015   Procedure: INTRAMEDULLARY (IM) NAIL INTERTROCHANTRIC;  Surgeon: Deeann Saint, MD;  Location: [facility] ORS;  Service: Orthopedics;  Laterality: Right;   KYPHOPLASTY N/A 08/16/2019   Procedure: L4  KYPHOPLASTY;  Surgeon: Kennedy Bucker, MD;  Location: [facility] ORS;  Service: Orthopedics;  Laterality: N/A;   KYPHOPLASTY N/A 08/29/2019   Procedure: L2 KYPHOPLASTY;  Surgeon: Kennedy Bucker, MD;  Location: [facility] ORS;  Service: Orthopedics;  Laterality: N/A;   KYPHOPLASTY N/A 10/10/2019   Procedure: L3 KYPHOPLASTY;  Surgeon: Kennedy Bucker, MD;  Location: [facility] ORS;  Service: Orthopedics;  Laterality: N/A;   open heart surgery  2019   valve replacement, aortic arch  aneurysm repair   TONSILLECTOMY     TUBAL LIGATION     WRIST RECONSTRUCTION Right     There were no vitals filed for this visit.   Subjective Assessment - 11/08/21 1310     Subjective Patient reports doing okay today; She denies any new falls/stumbles. Pt reports legs are not as sore as Saturday;    Pertinent History Pt is a 81 y.o. female referred to PT for gait and imbalance. PMH includes: Anxiety, skin cancer, GERD, HTN, osteoporosis, L2-L4 kyphoplasties 08/29/19, R hip ORIF. Pt reporting for OPPT for gait/imbalance. Was recently receiving OP PT for R shoulder but that has been complete. Pt 's daughter present to assist in history taking. Pt's daughter reports since covid, open heart surgery, recent surgeries causing balance concerns dating from 2019. No falls in last 6 months, 1  fall in the last year. Near falls reported. Pt is a furniture walker, does use rollator in house. Pt endorsing difficulty with vision, obstacle navigation, clearing feet with walking. Home lay out noted in flow sheets. Pt's goal is to improve balance and strength.    Limitations Lifting;Standing;Walking    Patient Stated Goals Improve balance and strength.    Currently in Pain? No/denies    Multiple Pain Sites No                TREATMENT: Ex: Warm up on Nustep BUE/BLE seat #9, arm #7, level 2-4 interval training x4 min with cues to keep steps per minute >50 to challenge cardiovascular conditioning;  Instructed patient in BLE strengthening: Standing  with red tband around BLE: -hip abduction x10 reps, moderate difficulty Requires UE assist on railing; Required min VCs for proper positioning including to avoid flexed posture, increase core stabilization and to improve LE positioning for hip strengthening;  Seated: Red tband hamstring curl x10 reps each LE with cues for proper positioning, reports moderate difficulty; LAQ 2# ankle weight with ankle DF for increased stretch in posterior leg x12 reps each LE;  Standing heel/toe raises 2# x10 reps with cues to avoid hip movement and isolate ankle ROM;  Stepping over orange hurdle: Forward/backward x5 reps with 2# ankle weight and 1 rail assist Side stepping over orange hurdle with 2# ankle weight x5 reps with BUE assist;   Instructed patient in balance exercise: Standing on airex pad: -Alternate toe taps to 6 inch step x10 reps unsupported, min A for safety, required increased time;  -progressed to standing one foot on airex, one foot on 6 inch step:             Unsupported x20 sec hold, minimal instability             Progressed to lateral head turns side/side x5 reps with CGA for safety;  Patient does exhibit increased unsteadiness with reduced rail assist while on compliant surface. She was able to hold static stance better with less instability;   Tolerated session well. Does report mild fatigue at end of session;                            PT Education - 11/08/21 1311     Education Details exercise technique/positioning;    Person(s) Educated Patient    Methods Explanation;Verbal cues    Comprehension Returned demonstration;Verbalized understanding;Verbal cues required;Need further instruction              PT Short Term Goals - 10/21/21 1359       PT SHORT TERM GOAL #1   Title Pt will be independent with HEP to improve posture and balance for ADL completion    Baseline 08/11/21: initiated; Has been doing her HEP, no questions (09/15/2021) 4/13: doing  well    Time 10    Period Weeks    Status Achieved    Target Date 10/20/21               PT Long Term Goals - 10/21/21 1359       PT LONG TERM GOAL #1   Title Pt will improve FOTO to target score of 54 to demonstrate clinically significant improvement in functional mobility    Baseline 08/11/21: 43; 51 (09/15/2021)    Time 8    Period Weeks    Status Partially Met    Target Date 12/16/21  PT LONG TERM GOAL #2   Title Pt will improve 5xSTS to <15 sec without UE support to demonstrate clinically significant improvement in strength and reduce fall risk.    Baseline 08/11/21: 23.08 sec with UE's on arm rests; 23 seconds with UE's on arm rests (09/15/2021), 4/13: 17.07 sec without UE assist;    Time 8    Period Weeks    Status Revised    Target Date 12/16/21      PT LONG TERM GOAL #3   Title Pt will improve DGI to 19 to demonstrate clinicially significant improvement in falls reduction with community ambulation tasks.    Baseline 08/11/21: 11/24, 4/13: deferred due to time    Time 8    Period Weeks    Status Deferred    Target Date 12/16/21      PT LONG TERM GOAL #4   Title Pt will improve normal walking speed to > 0.8 m/s to decrease risk of falls with household walking tasks.    Baseline 08/11/21: 0.58 m/s; 0.71 m/s with rollator walker (09/15/2021), 4/13: 0.69 m/s (regular pace);    Time 8    Period Weeks    Status On-going    Target Date 12/16/21      PT LONG TERM GOAL #5   Title Pt will improve Berg balance by 8 points to display clinically significant improvement in reduced fall risk with ADL's    Baseline 08/18/21: 16/10; 43/56 (09/15/2021), 4/13: 49/56    Time 8    Period Weeks    Status Achieved    Target Date 12/16/21                   Plan - 11/09/21 0829     Clinical Impression Statement Patient motivated and participated well within session; She was instructed in LE strengthening exercise. PT utilized lighter weight to reduce strain on LE for less  delayed onset muscle soreness. Patient does require min VCS for proper positioning/exercise technique. She does report fatigue with advanced exercise. She does require rail assist for stance control; Patient also requires cues for correct posture and positioning including to increase core stabilization for less back discomfort. Patient instructed in balance exercise. She is still challenged with airex pad with reduced rail assist. Patient did require CGA to min A when unsupported. She would benefit from additional skilled PT intervention to improve strength, balance and mobiltiy;    Personal Factors and Comorbidities Age;Time since onset of injury/illness/exacerbation;Comorbidity 3+;Past/Current Experience    Comorbidities PMH inc;ludes: Anxiety, skin cancer, GERD, HTN, osteoporosis, L2-L4 kyphoplasties 08/29/19, R hip ORIF.    Examination-Activity Limitations Stairs;Bend;Locomotion Level;Stand;Reach Overhead;Carry;Transfers;Squat    Examination-Participation Restrictions Shop;Community Activity    Stability/Clinical Decision Making Stable/Uncomplicated    Rehab Potential Fair    PT Frequency 2x / week    PT Duration 8 weeks    PT Treatment/Interventions ADLs/Self Care Home Management;DME Instruction;Gait training;Stair training;Functional mobility training;Therapeutic activities;Therapeutic exercise;Balance training;Neuromuscular re-education;Patient/family education    PT Next Visit Plan Progress balance as appropriate next session.    PT Home Exercise Plan Access Code [email or web address] with scap retractions for posture; no updates    Consulted and Agree with Plan of Care Patient;Family member/caregiver    Family Member Consulted Daughter             Patient will benefit from skilled therapeutic intervention in order to improve the following deficits and impairments:  Abnormal gait, Improper body mechanics, Decreased mobility, Postural dysfunction, Decreased activity tolerance, Decreased balance,  Decreased strength, Difficulty walking  Visit Diagnosis: Difficulty in walking, not elsewhere classified  Unsteadiness on feet  Muscle weakness (generalized)  Abnormality of gait and mobility  Abnormal posture     Problem List Patient Active Problem List   Diagnosis Date Noted   Femur fracture, right, closed, initial encounter 11/25/2015    Carlyne Keehan, PT, DPT 11/09/2021, 8:41 AM  [facility] MAIN [facility] SERVICES [address] [place], [place], [address] Phone: [phone]   Fax:  [phone]  Name: April Rogers MRN: [id number] Date of Birth: 09-19-1940

## 2021-11-10 ENCOUNTER — Ambulatory Visit: Payer: Medicare HMO

## 2021-11-11 ENCOUNTER — Ambulatory Visit: Payer: Medicare HMO | Admitting: Physical Therapy

## 2021-11-11 ENCOUNTER — Encounter: Payer: Self-pay | Admitting: Physical Therapy

## 2021-11-11 DIAGNOSIS — R293 Abnormal posture: Secondary | ICD-10-CM | POA: Diagnosis not present

## 2021-11-11 DIAGNOSIS — R2681 Unsteadiness on feet: Secondary | ICD-10-CM | POA: Diagnosis not present

## 2021-11-11 DIAGNOSIS — R269 Unspecified abnormalities of gait and mobility: Secondary | ICD-10-CM | POA: Diagnosis not present

## 2021-11-11 DIAGNOSIS — R262 Difficulty in walking, not elsewhere classified: Secondary | ICD-10-CM | POA: Diagnosis not present

## 2021-11-11 DIAGNOSIS — M6281 Muscle weakness (generalized): Secondary | ICD-10-CM

## 2021-11-11 NOTE — Therapy (Signed)
[facility] ?[facility] [place] MAIN REHAB SERVICES ?[place], [place], [address] ?Phone: [phone]   Fax:  [phone] ? ?Physical Therapy Treatment ? ?Patient Details  ?Name: April Rogers ?MRN: [id number] ?Date of Birth: 1940/11/20 ?Referring Provider (PT): Duanne Guess, [place] ? ? ?Encounter Date: 11/11/2021 ? ? PT End of Session - 11/11/21 1449   ? ? Visit Number 26   ? Number of Visits 37   ? Date for PT Re-Evaluation 12/17/21   ? Authorization Type [employer]   ? Authorization Time Period 10/21/21-01/07/22 for 20 visits   ? Authorization - Visit Number 6   ? Authorization - Number of Visits 20   ? Progress Note Due on Visit 30   ? PT Start Time 1434   ? PT Stop Time 1515   ? PT Time Calculation (min) 41 min   ? Equipment Utilized During Treatment Gait belt   ? Activity Tolerance Patient tolerated treatment well;No increased pain;Patient limited by fatigue   ? Behavior During Therapy [facility] [place] [facility] for tasks assessed/performed   ? ?  ?  ? ?  ? ? ?Past Medical History:  ?Diagnosis Date  ? Anxiety   ? Arthritis   ? Cancer [facility])   ? skin cancer  ? Complication of anesthesia   ? general anesthesia hard on memory after pt goes home  ? Depression   ? Fracture of one rib   ? GERD (gastroesophageal reflux disease)   ? was on nexium but no longer needs to take this medication  ? Hypertension   ? Macular degeneration of both eyes   ? Osteoporosis   ? ? ?Past Surgical History:  ?Procedure Laterality Date  ? ARM SKIN LESION BIOPSY / EXCISION Right   ? CHOLECYSTECTOMY    ? COLONOSCOPY WITH PROPOFOL N/A 08/03/2015  ? Procedure: COLONOSCOPY WITH PROPOFOL;  Surgeon: Hulen Luster, MD;  Location: [facility] ENDOSCOPY;  Service: Gastroenterology;  Laterality: N/A;  ? INTRAMEDULLARY (IM) NAIL INTERTROCHANTERIC Right 11/26/2015  ? Procedure: INTRAMEDULLARY (IM) NAIL INTERTROCHANTRIC;  Surgeon: Earnestine Leys, MD;  Location: [facility] ORS;  Service: Orthopedics;  Laterality: Right;  ? KYPHOPLASTY N/A 08/16/2019  ? Procedure: L4  KYPHOPLASTY;  Surgeon: Hessie Knows, MD;  Location: [facility] ORS;  Service: Orthopedics;  Laterality: N/A;  ? KYPHOPLASTY N/A 08/29/2019  ? Procedure: L2 KYPHOPLASTY;  Surgeon: Hessie Knows, MD;  Location: [facility] ORS;  Service: Orthopedics;  Laterality: N/A;  ? KYPHOPLASTY N/A 10/10/2019  ? Procedure: L3 KYPHOPLASTY;  Surgeon: Hessie Knows, MD;  Location: [facility] ORS;  Service: Orthopedics;  Laterality: N/A;  ? open heart surgery  2019  ? valve replacement, aortic arch  aneurysm repair  ? TONSILLECTOMY    ? TUBAL LIGATION    ? WRIST RECONSTRUCTION Right   ? ? ?There were no vitals filed for this visit. ? ? Subjective Assessment - 11/11/21 1448   ? ? Subjective Pt reports doing good. Denies any soreness today. reports haivng a good week;   ? Pertinent History Pt is a 81 y.o. female referred to PT for gait and imbalance. PMH includes: Anxiety, skin cancer, GERD, HTN, osteoporosis, L2-L4 kyphoplasties 08/29/19, R hip ORIF. Pt reporting for OPPT for gait/imbalance. Was recently receiving OP PT for R shoulder but that has been complete. Pt 's daughter present to assist in history taking. Pt's daughter reports since covid, open heart surgery, recent surgeries causing balance concerns dating from 2019. No falls in last 6 months, 1 fall in the last year. Near  falls reported. Pt is a furniture walker, does use rollator in house. Pt endorsing difficulty with vision, obstacle navigation, clearing feet with walking. Home lay out noted in flow sheets. Pt's goal is to improve balance and strength.   ? Limitations Lifting;Standing;Walking   ? Patient Stated Goals Improve balance and strength.   ? Currently in Pain? No/denies   ? ?  ?  ? ?  ? ? ?TREATMENT: ?Ex: ?Warm up on Nustep BUE/BLE seat #9, arm #7, level 2x4 min with cues to keep steps per minute >50 to challenge cardiovascular conditioning; ?  ? ?Instructed patient in balance exercise: ?Standing on airex pad: ?Forward/backward step airex to airex x10 reps with 1-0 rail assist ?Side  stepping airex to airex unsupported x10 reps ?Required CGA to min A and cues for erect posture and to increase step length for better foot clearance; ?-Standing one foot on each airex (mid stance phase) ?Unsupported standing 15 sec hold, mild sway ?Progressed to holding ball in BUE arm raise x5 reps with moderate difficulty, min A for safety; ?-Alternate toe taps to 6 inch step x10 reps unsupported, min A for safety, required increased time;  ?-progressed to standing one foot on airex, one foot on 6 inch step: ?            Unsupported x20 sec hold, minimal instability ?            Progressed to lateral head turns side/side x5 reps with CGA for safety; ? Patient  exhibits better stance control with less unsteadiness in static stance. She does exhibit mild ankle instability with dynamic movement with toe taps;  ?  ?Tolerated session well. Does report mild fatigue at end of session;  ?  ? ? ? ? ? ? ? ? ? ? ? ? ? ? ? ? ? ? ? ? ? ? ? ? ? ? PT Education - 11/11/21 1449   ? ? Education Details exercise technique/positioning;   ? Person(s) Educated Patient   ? Methods Explanation;Verbal cues   ? Comprehension Verbalized understanding;Returned demonstration;Verbal cues required;Need further instruction   ? ?  ?  ? ?  ? ? ? PT Short Term Goals - 10/21/21 1359   ? ?  ? PT SHORT TERM GOAL #1  ? Title Pt will be independent with HEP to improve posture and balance for ADL completion   ? Baseline 08/11/21: initiated; Has been doing her HEP, no questions (09/15/2021) 4/13: doing well   ? Time 10   ? Period Weeks   ? Status Achieved   ? Target Date 10/20/21   ? ?  ?  ? ?  ? ? ? ? PT Long Term Goals - 10/21/21 1359   ? ?  ? PT LONG TERM GOAL #1  ? Title Pt will improve FOTO to target score of 54 to demonstrate clinically significant improvement in functional mobility   ? Baseline 08/11/21: 43; 51 (09/15/2021)   ? Time 8   ? Period Weeks   ? Status Partially Met   ? Target Date 12/16/21   ?  ? PT LONG TERM GOAL #2  ? Title Pt will improve  5xSTS to <15 sec without UE support to demonstrate clinically significant improvement in strength and reduce fall risk.   ? Baseline 08/11/21: 23.08 sec with UE's on arm rests; 23 seconds with UE's on arm rests (09/15/2021), 4/13: 17.07 sec without UE assist;   ? Time 8   ? Period Weeks   ?   Status Revised   ? Target Date 12/16/21   ?  ? PT LONG TERM GOAL #3  ? Title Pt will improve DGI to 19 to demonstrate clinicially significant improvement in falls reduction with community ambulation tasks.   ? Baseline 08/11/21: 11/24, 4/13: deferred due to time   ? Time 8   ? Period Weeks   ? Status Deferred   ? Target Date 12/16/21   ?  ? PT LONG TERM GOAL #4  ? Title Pt will improve normal walking speed to > 0.8 m/s to decrease risk of falls with household walking tasks.   ? Baseline 08/11/21: 0.58 m/s; 0.71 m/s with rollator walker (09/15/2021), 4/13: 0.69 m/s (regular pace);   ? Time 8   ? Period Weeks   ? Status On-going   ? Target Date 12/16/21   ?  ? PT LONG TERM GOAL #5  ? Title Pt will improve Berg balance by 8 points to display clinically significant improvement in reduced fall risk with ADL's   ? Baseline 08/18/21: 40/56; 43/56 (09/15/2021), 4/13: 49/56   ? Time 8   ? Period Weeks   ? Status Achieved   ? Target Date 12/16/21   ? ?  ?  ? ?  ? ? ? ? ? ? ? ? Plan - 11/12/21 1350   ? ? Clinical Impression Statement Patient motivated and participated well within session. She was instructed in advanced balance exercise. Patient is challenged with standing on compliant surface. However she was able to exhibit better stance control requiring less support in static balance exercise. She does require cues for increased step length with dynamic balance exercise. She also requires intermittent rail assist, having a more difficult time with backward stepping. Pt would benefit from additional skilled PT Intervention to improve strength, balance and mobility;   ? Personal Factors and Comorbidities Age;Time since onset of  injury/illness/exacerbation;Comorbidity 3+;Past/Current Experience   ? Comorbidities PMH inc;ludes: Anxiety, skin cancer, GERD, HTN, osteoporosis, L2-L4 kyphoplasties 08/29/19, R hip ORIF.   ? Examination-Activity Limitations Stairs

## 2021-11-15 ENCOUNTER — Encounter: Payer: Self-pay | Admitting: Physical Therapy

## 2021-11-16 ENCOUNTER — Encounter: Payer: Self-pay | Admitting: Physical Therapy

## 2021-11-16 NOTE — Therapy (Signed)
[facility] ?[place] [facility] RE[facility] ?[place], [place], [address] ?Phone: [phone]   Fax:  [phone] ? ?Patient Details  ?Name: April Rogers ?MRN: [id number] ?Date of Birth: March 26, 1941 ?Referring Provider:  Dorise Hiss, PA ? ?Encounter Date: 11/16/2021 ? ? ?To Whom It May Concern: ? ?[name] currently ambulates with a rollater intermittently for balance and gait safety. She is considered at an increased risk for falls. She does require increased time to get on/off vehicles for safety due to imbalance and impaired vision. She would benefit from assistance getting rollator on/off van to improve safety when getting on/off vehicle. ? ?Thank you for your consideration. ? ?Jeyren Danowski, PT, DPT ?11/16/2021, 9:24 AM ? ?[facility] ?[place] [facility] RE[facility] ?[place], [place], [address] ?Phone: [phone]   Fax:  [phone] ?

## 2021-11-17 ENCOUNTER — Ambulatory Visit: Payer: Medicare HMO | Admitting: Physical Therapy

## 2021-11-22 ENCOUNTER — Ambulatory Visit: Payer: Medicare HMO | Admitting: Physical Therapy

## 2021-11-22 ENCOUNTER — Encounter: Payer: Self-pay | Admitting: Physical Therapy

## 2021-11-22 DIAGNOSIS — M6281 Muscle weakness (generalized): Secondary | ICD-10-CM

## 2021-11-22 DIAGNOSIS — R293 Abnormal posture: Secondary | ICD-10-CM

## 2021-11-22 DIAGNOSIS — R2681 Unsteadiness on feet: Secondary | ICD-10-CM

## 2021-11-22 DIAGNOSIS — R262 Difficulty in walking, not elsewhere classified: Secondary | ICD-10-CM

## 2021-11-22 DIAGNOSIS — R269 Unspecified abnormalities of gait and mobility: Secondary | ICD-10-CM

## 2021-11-22 NOTE — Therapy (Signed)
[facility] ?[place], [place], [address] ?Phone: [phone]   Fax:  [phone] ? ?Physical Therapy Treatment ? ?Patient Details  ?Name: April Rogers ?MRN: [id number] ?Date of Birth: [birth year] ?Referring Provider (PT): Duanne Guess, [place] ? ? ?Encounter Date: 11/22/2021 ? ? PT End of Session - 11/22/21 1301   ? ? Visit Number 27   ? Number of Visits 37   ? Date for PT Re-Evaluation 12/17/21   ? Authorization Type [employer]   ? Authorization Time Period 10/21/21-01/07/22 for 20 visits   ? Authorization - Visit Number 6   ? Authorization - Number of Visits 20   ? Progress Note Due on Visit 30   ? PT Start Time 1302   ? PT Stop Time 3532   ? PT Time Calculation (min) 43 min   ? Equipment Utilized During Treatment Gait belt   ? Activity Tolerance Patient tolerated treatment well;No increased pain;Patient limited by fatigue   ? Behavior During Therapy [facility] for tasks assessed/performed   ? ?  ?  ? ?  ? ? ?Past Medical History:  ?Diagnosis Date  ? Anxiety   ? Arthritis   ? Cancer [facility])   ? skin cancer  ? Complication of anesthesia   ? general anesthesia hard on memory after pt goes home  ? Depression   ? Fracture of one rib   ? GERD (gastroesophageal reflux disease)   ? was on nexium but no longer needs to take this medication  ? Hypertension   ? Macular degeneration of both eyes   ? Osteoporosis   ? ? ?Past Surgical History:  ?Procedure Laterality Date  ? ARM SKIN LESION BIOPSY / EXCISION Right   ? CHOLECYSTECTOMY    ? COLONOSCOPY WITH PROPOFOL N/A 08/03/2015  ? Procedure: COLONOSCOPY WITH PROPOFOL;  Surgeon: Hulen Luster, MD;  Location: [facility] ENDOSCOPY;  Service: Gastroenterology;  Laterality: N/A;  ? INTRAMEDULLARY (IM) NAIL INTERTROCHANTERIC Right 11/26/2015  ? Procedure: INTRAMEDULLARY (IM) NAIL INTERTROCHANTRIC;  Surgeon: Earnestine Leys, MD;  Location: [facility] ORS;  Service: Orthopedics;  Laterality: Right;  ? KYPHOPLASTY N/A 08/16/2019  ? Procedure: L4  KYPHOPLASTY;  Surgeon: Hessie Knows, MD;  Location: [facility] ORS;  Service: Orthopedics;  Laterality: N/A;  ? KYPHOPLASTY N/A 08/29/2019  ? Procedure: L2 KYPHOPLASTY;  Surgeon: Hessie Knows, MD;  Location: [facility] ORS;  Service: Orthopedics;  Laterality: N/A;  ? KYPHOPLASTY N/A 10/10/2019  ? Procedure: L3 KYPHOPLASTY;  Surgeon: Hessie Knows, MD;  Location: [facility] ORS;  Service: Orthopedics;  Laterality: N/A;  ? open heart surgery  2019  ? valve replacement, aortic arch  aneurysm repair  ? TONSILLECTOMY    ? TUBAL LIGATION    ? WRIST RECONSTRUCTION Right   ? ? ?There were no vitals filed for this visit. ? ? Subjective Assessment - 11/22/21 1308   ? ? Subjective Pt reports feeling better than last week. She reports having some pain in her right arm. She did start doing some elbow curls and isn't sure if the pain is coming from that or from chronic right shoulder pain. She did have cortisone injection in right shoulder in Dec 2022 and that could be wearing off; She also reports having popping in neck- posterior neck/central, "its a loud pop/snap."   ? Pertinent History Pt is a 81 y.o. female referred to PT for gait and imbalance. PMH includes: Anxiety, skin cancer, GERD, HTN, osteoporosis, L2-L4 kyphoplasties 08/29/19, R hip ORIF. Pt  reporting for OPPT for gait/imbalance. Was recently receiving OP PT for R shoulder but that has been complete. Pt 's daughter present to assist in history taking. Pt's daughter reports since covid, open heart surgery, recent surgeries causing balance concerns dating from 2019. No falls in last 6 months, 1 fall in the last year. Near falls reported. Pt is a furniture walker, does use rollator in house. Pt endorsing difficulty with vision, obstacle navigation, clearing feet with walking. Home lay out noted in flow sheets. Pt's goal is to improve balance and strength.   ? Limitations Lifting;Standing;Walking   ? Patient Stated Goals Improve balance and strength.   ? Currently in Pain? No/denies   ?  Multiple Pain Sites No   ? ?  ?  ? ?  ? ? ? ? ? ? ? ? ?  ?TREATMENT: ?Ex: ?Standing with red tband around BLE: ?-side stepping x10 feet x2 laps each direction with BUE rail assist, required min VCS for proper positioning and to increase erect posture;  ? ?Standing with 3# ankle weights: ?Forward/backward step over 1/2 bolster x10 reps with 1 rail assist ?Side stepping over 1/2 bolster x10 reps each direction with BUE rail assist ?Pt requires cues to increase step length for better foot clearance; ? ?Seated LAQ 3 sec hold with ankle DF to increase ROM x10 reps each; ? ?Instructed patient in balance exercise: ?-Alternate toe taps to 6 inch step x10 reps unsupported, min A for safety, required increased time;  ?-progressed to standing one foot on airex, one foot on 6 inch step: ?            Unsupported x20 sec hold, minimal instability ?            Progressed to lateral head turns side/side x5 reps with CGA for safety; ? Patient  exhibits better stance control with less unsteadiness in static stance. She does exhibit mild ankle instability with dynamic movement with toe taps;  ?  ?Tolerated session well. Does report mild fatigue at end of session;  ?  ?  ?  ? ? ? ? ? ? ? ? ? ? ? ? ? ? ? ? ? ? ? ? PT Education - 11/22/21 1320   ? ? Education Details exercise technique/positioning;   ? Person(s) Educated Patient   ? Methods Explanation;Verbal cues   ? Comprehension Verbalized understanding;Returned demonstration;Verbal cues required;Need further instruction   ? ?  ?  ? ?  ? ? ? PT Short Term Goals - 10/21/21 1359   ? ?  ? PT SHORT TERM GOAL #1  ? Title Pt will be independent with HEP to improve posture and balance for ADL completion   ? Baseline 08/11/21: initiated; Has been doing her HEP, no questions (09/15/2021) 4/13: doing well   ? Time 10   ? Period Weeks   ? Status Achieved   ? Target Date 10/20/21   ? ?  ?  ? ?  ? ? ? ? PT Long Term Goals - 10/21/21 1359   ? ?  ? PT LONG TERM GOAL #1  ? Title Pt will improve FOTO to  target score of 54 to demonstrate clinically significant improvement in functional mobility   ? Baseline 08/11/21: 43; 51 (09/15/2021)   ? Time 8   ? Period Weeks   ? Status Partially Met   ? Target Date 12/16/21   ?  ? PT LONG TERM GOAL #2  ? Title Pt will improve 5xSTS to <  15 sec without UE support to demonstrate clinically significant improvement in strength and reduce fall risk.   ? Baseline 08/11/21: 23.08 sec with UE's on arm rests; 23 seconds with UE's on arm rests (09/15/2021), 4/13: 17.07 sec without UE assist;   ? Time 8   ? Period Weeks   ? Status Revised   ? Target Date 12/16/21   ?  ? PT LONG TERM GOAL #3  ? Title Pt will improve DGI to 19 to demonstrate clinicially significant improvement in falls reduction with community ambulation tasks.   ? Baseline 08/11/21: 11/24, 4/13: deferred due to time   ? Time 8   ? Period Weeks   ? Status Deferred   ? Target Date 12/16/21   ?  ? PT LONG TERM GOAL #4  ? Title Pt will improve normal walking speed to > 0.8 m/s to decrease risk of falls with household walking tasks.   ? Baseline 08/11/21: 0.58 m/s; 0.71 m/s with rollator walker (09/15/2021), 4/13: 0.69 m/s (regular pace);   ? Time 8   ? Period Weeks   ? Status On-going   ? Target Date 12/16/21   ?  ? PT LONG TERM GOAL #5  ? Title Pt will improve Berg balance by 8 points to display clinically significant improvement in reduced fall risk with ADL's   ? Baseline 08/18/21: 40/56; 43/56 (09/15/2021), 4/13: 49/56   ? Time 8   ? Period Weeks   ? Status Achieved   ? Target Date 12/16/21   ? ?  ?  ? ?  ? ? ? ? ? ? ? ? Plan - 11/22/21 1334   ? ? Clinical Impression Statement Patient motivated and participated well within session; She was instructed in advanced LE strengthening exercise. She requires min VCs for proper positionin/exercise technique; Progressed exercise with resistance. She does require rail assist intermittently for balance control; She does require min VCS to increase step length when negotiating obstacles. utilized  different color obstacles to improve visual cues as patient does have impaired vision; Throughout session she required min VCs to improve erect posture as patient has fixed thoracic kyphosis and often looks do

## 2021-11-24 ENCOUNTER — Ambulatory Visit: Payer: Medicare HMO | Admitting: Physical Therapy

## 2021-11-24 ENCOUNTER — Encounter: Payer: Self-pay | Admitting: Physical Therapy

## 2021-11-24 DIAGNOSIS — R262 Difficulty in walking, not elsewhere classified: Secondary | ICD-10-CM

## 2021-11-24 DIAGNOSIS — R2681 Unsteadiness on feet: Secondary | ICD-10-CM | POA: Diagnosis not present

## 2021-11-24 DIAGNOSIS — M6281 Muscle weakness (generalized): Secondary | ICD-10-CM | POA: Diagnosis not present

## 2021-11-24 DIAGNOSIS — R269 Unspecified abnormalities of gait and mobility: Secondary | ICD-10-CM

## 2021-11-24 DIAGNOSIS — R293 Abnormal posture: Secondary | ICD-10-CM | POA: Diagnosis not present

## 2021-11-24 NOTE — Therapy (Signed)
[place] [facility] [address] [place], [place], [address] Phone: [phone]   Fax:  [phone]  Physical Therapy Treatment  Patient Details  Name: April Rogers MRN: [id number] Date of Birth: 24-Oct-1940 Referring Provider (PT): Duanne Guess, [place]   Encounter Date: 11/24/2021   PT End of Session - 11/24/21 1146     Visit Number 28    Number of Visits 37    Date for PT Re-Evaluation 12/17/21    Authorization Type [employer]    Authorization Time Period 10/21/21-01/07/22 for 20 visits    Authorization - Visit Number 6    Authorization - Number of Visits 20    Progress Note Due on Visit 30    PT Start Time 1140    PT Stop Time 1225    PT Time Calculation (min) 45 min    Equipment Utilized During Treatment Gait belt    Activity Tolerance Patient tolerated treatment well;No increased pain;Patient limited by fatigue    Behavior During Therapy [facility] for tasks assessed/performed             Past Medical History:  Diagnosis Date   Anxiety    Arthritis    Cancer ([place])    skin cancer   Complication of anesthesia    general anesthesia hard on memory after pt goes home   Depression    Fracture of one rib    GERD (gastroesophageal reflux disease)    was on nexium but no longer needs to take this medication   Hypertension    Macular degeneration of both eyes    Osteoporosis     Past Surgical History:  Procedure Laterality Date   ARM SKIN LESION BIOPSY / EXCISION Right    CHOLECYSTECTOMY     COLONOSCOPY WITH PROPOFOL N/A 08/03/2015   Procedure: COLONOSCOPY WITH PROPOFOL;  Surgeon: Hulen Luster, MD;  Location: [facility] ENDOSCOPY;  Service: Gastroenterology;  Laterality: N/A;   INTRAMEDULLARY (IM) NAIL INTERTROCHANTERIC Right 11/26/2015   Procedure: INTRAMEDULLARY (IM) NAIL INTERTROCHANTRIC;  Surgeon: Earnestine Leys, MD;  Location: [facility] ORS;  Service: Orthopedics;  Laterality: Right;   KYPHOPLASTY N/A 08/16/2019   Procedure: L4  KYPHOPLASTY;  Surgeon: Hessie Knows, MD;  Location: [facility] ORS;  Service: Orthopedics;  Laterality: N/A;   KYPHOPLASTY N/A 08/29/2019   Procedure: L2 KYPHOPLASTY;  Surgeon: Hessie Knows, MD;  Location: [facility] ORS;  Service: Orthopedics;  Laterality: N/A;   KYPHOPLASTY N/A 10/10/2019   Procedure: L3 KYPHOPLASTY;  Surgeon: Hessie Knows, MD;  Location: [facility] ORS;  Service: Orthopedics;  Laterality: N/A;   open heart surgery  2019   valve replacement, aortic arch  aneurysm repair   TONSILLECTOMY     TUBAL LIGATION     WRIST RECONSTRUCTION Right     There were no vitals filed for this visit.   Subjective Assessment - 11/24/21 1145     Subjective Patient reports increased fatigue after yesterday. She did a lot of walking yesterday going to [employer] and walking around down driveway at [employer].    Pertinent History Pt is a 81 y.o. female referred to PT for gait and imbalance. PMH includes: Anxiety, skin cancer, GERD, HTN, osteoporosis, L2-L4 kyphoplasties 08/29/19, R hip ORIF. Pt reporting for OPPT for gait/imbalance. Was recently receiving OP PT for R shoulder but that has been complete. Pt 's daughter present to assist in history taking. Pt's daughter reports since covid, open heart surgery, recent surgeries causing balance concerns dating from 2019.  No falls in last 6 months, 1 fall in the last year. Near falls reported. Pt is a furniture walker, does use rollator in house. Pt endorsing difficulty with vision, obstacle navigation, clearing feet with walking. Home lay out noted in flow sheets. Pt's goal is to improve balance and strength.    Limitations Lifting;Standing;Walking    Patient Stated Goals Improve balance and strength.    Currently in Pain? No/denies    Multiple Pain Sites No                     TREATMENT: Warm up on Nustep BUE/BLE level 2 x4 min (unbilled);  Instructed patient in balance exercise: Standing on airex pad:  Feet together, unsupported 30 sec  hold, no sway Forward/backward step airex to airex with intermittent rail assist x10 reps  Side stepping airex to airex without rail assist x10 reps, min A for safety  Standing one foot on each airex:  Unsupported standing 60 sec hold x1 rep each foot in front, no sway  Forward/backward rock with alternate UE reach x10 reps, good coordination and positioning; did require CGA for safety;  -Alternate toe taps to 6 inch step x10 reps unsupported, min A for safety, required increased time;  -progressed to standing one foot on airex, one foot on 6 inch step:             Unsupported x20 sec hold, minimal instability             Progressed to trunk rotation side/side x5 reps with CGA for safety;  Patient  exhibits better stance control with less unsteadiness in static stance. She does exhibit mild ankle instability with dynamic movement with toe taps;   Pt able to hold SLS on firm surface 10 sec with intermittent CGA   Tolerated session well. Does report mild fatigue at end of session;                                PT Education - 11/24/21 1145     Education Details exercise technique/positioning;    Person(s) Educated Patient    Methods Explanation;Verbal cues    Comprehension Verbalized understanding;Returned demonstration;Verbal cues required;Need further instruction              PT Short Term Goals - 10/21/21 1359       PT SHORT TERM GOAL #1   Title Pt will be independent with HEP to improve posture and balance for ADL completion    Baseline 08/11/21: initiated; Has been doing her HEP, no questions (09/15/2021) 4/13: doing well    Time 10    Period Weeks    Status Achieved    Target Date 10/20/21               PT Long Term Goals - 10/21/21 1359       PT LONG TERM GOAL #1   Title Pt will improve FOTO to target score of 54 to demonstrate clinically significant improvement in functional mobility    Baseline 08/11/21: 43; 51 (09/15/2021)    Time 8     Period Weeks    Status Partially Met    Target Date 12/16/21      PT LONG TERM GOAL #2   Title Pt will improve 5xSTS to <15 sec without UE support to demonstrate clinically significant improvement in strength and reduce fall risk.    Baseline 08/11/21: 23.08 sec with  UE's on arm rests; 23 seconds with UE's on arm rests (09/15/2021), 4/13: 17.07 sec without UE assist;    Time 8    Period Weeks    Status Revised    Target Date 12/16/21      PT LONG TERM GOAL #3   Title Pt will improve DGI to 19 to demonstrate clinicially significant improvement in falls reduction with community ambulation tasks.    Baseline 08/11/21: 11/24, 4/13: deferred due to time    Time 8    Period Weeks    Status Deferred    Target Date 12/16/21      PT LONG TERM GOAL #4   Title Pt will improve normal walking speed to > 0.8 m/s to decrease risk of falls with household walking tasks.    Baseline 08/11/21: 0.58 m/s; 0.71 m/s with rollator walker (09/15/2021), 4/13: 0.69 m/s (regular pace);    Time 8    Period Weeks    Status On-going    Target Date 12/16/21      PT LONG TERM GOAL #5   Title Pt will improve Berg balance by 8 points to display clinically significant improvement in reduced fall risk with ADL's    Baseline 08/18/21: 40/56; 43/56 (09/15/2021), 4/13: 49/56    Time 8    Period Weeks    Status Achieved    Target Date 12/16/21                   Plan - 11/25/21 0940     Clinical Impression Statement Patient motivated and participated well within session. She was more fatigued today after having a busy week. Instructed patient in advanced balance tasks. She continues to be challenged with stepping over obstacles/to obstacles with decreased step length. Patient requires intermittent rail assist. She was able to exhibit improved coordination this session with forward rock and reach moving UE/LE with good sequencing. She also is able to hold SLS for approximately 10 sec with CGA intermittently. Patient  educated on importance of taking things slow and continuing to practice good safety awareness as she has made lots of progress and is pleased with improved mobility. She would benefit from additional skilled PT Intervention to improve strength, balance and mobility; Plan to address goals next week.    Personal Factors and Comorbidities Age;Time since onset of injury/illness/exacerbation;Comorbidity 3+;Past/Current Experience    Comorbidities PMH inc;ludes: Anxiety, skin cancer, GERD, HTN, osteoporosis, L2-L4 kyphoplasties 08/29/19, R hip ORIF.    Examination-Activity Limitations Stairs;Bend;Locomotion Level;Stand;Reach Overhead;Carry;Transfers;Squat    Examination-Participation Restrictions Shop;Community Activity    Stability/Clinical Decision Making Stable/Uncomplicated    Rehab Potential Fair    PT Frequency 2x / week    PT Duration 8 weeks    PT Treatment/Interventions ADLs/Self Care Home Management;DME Instruction;Gait training;Stair training;Functional mobility training;Therapeutic activities;Therapeutic exercise;Balance training;Neuromuscular re-education;Patient/family education    PT Next Visit Plan Progress balance as appropriate next session.    PT Home Exercise Plan Access Code [email or web address] with scap retractions for posture; no updates    Consulted and Agree with Plan of Care Patient;Family member/caregiver    Family Member Consulted Daughter             Patient will benefit from skilled therapeutic intervention in order to improve the following deficits and impairments:  Abnormal gait, Improper body mechanics, Decreased mobility, Postural dysfunction, Decreased activity tolerance, Decreased balance, Decreased strength, Difficulty walking  Visit Diagnosis: Difficulty in walking, not elsewhere classified  Unsteadiness on feet  Muscle weakness (generalized)  Abnormality of gait  and mobility  Abnormal posture     Problem List Patient Active Problem List   Diagnosis Date  Noted   Femur fracture, right, closed, initial encounter 11/25/2015    Haedyn Breau, PT, DPT 11/25/2021, 9:48 AM  [place] [facility] [place], [place], [address] Phone: [phone]   Fax:  [phone]  Name: April Rogers MRN: [id number] Date of Birth: [birth year]

## 2021-11-30 ENCOUNTER — Ambulatory Visit: Payer: Medicare HMO | Admitting: Physical Therapy

## 2021-11-30 DIAGNOSIS — R2681 Unsteadiness on feet: Secondary | ICD-10-CM | POA: Diagnosis not present

## 2021-11-30 DIAGNOSIS — R262 Difficulty in walking, not elsewhere classified: Secondary | ICD-10-CM | POA: Diagnosis not present

## 2021-11-30 DIAGNOSIS — M6281 Muscle weakness (generalized): Secondary | ICD-10-CM

## 2021-11-30 DIAGNOSIS — R293 Abnormal posture: Secondary | ICD-10-CM | POA: Diagnosis not present

## 2021-11-30 DIAGNOSIS — R269 Unspecified abnormalities of gait and mobility: Secondary | ICD-10-CM | POA: Diagnosis not present

## 2021-11-30 NOTE — Therapy (Unsigned)
[place] [facility]IN [facility] SERVICES [address] [place], [place], [address] Phone: [phone]   Fax:  [phone]  Physical Therapy Treatment  Patient Details  Name: April Rogers MRN: [id number] Date of Birth: 07-05-1941 Referring Provider (PT): Duanne Guess, [place]   Encounter Date: 11/30/2021    Past Medical History:  Diagnosis Date   Anxiety    Arthritis    Cancer [facility])    skin cancer   Complication of anesthesia    general anesthesia hard on memory after pt goes home   Depression    Fracture of one rib    GERD (gastroesophageal reflux disease)    was on nexium but no longer needs to take this medication   Hypertension    Macular degeneration of both eyes    Osteoporosis     Past Surgical History:  Procedure Laterality Date   ARM SKIN LESION BIOPSY / EXCISION Right    CHOLECYSTECTOMY     COLONOSCOPY WITH PROPOFOL N/A 08/03/2015   Procedure: COLONOSCOPY WITH PROPOFOL;  Surgeon: Hulen Luster, MD;  Location: [facility] ENDOSCOPY;  Service: Gastroenterology;  Laterality: N/A;   INTRAMEDULLARY (IM) NAIL INTERTROCHANTERIC Right 11/26/2015   Procedure: INTRAMEDULLARY (IM) NAIL INTERTROCHANTRIC;  Surgeon: Earnestine Leys, MD;  Location: [facility] ORS;  Service: Orthopedics;  Laterality: Right;   KYPHOPLASTY N/A 08/16/2019   Procedure: L4 KYPHOPLASTY;  Surgeon: Hessie Knows, MD;  Location: [facility] ORS;  Service: Orthopedics;  Laterality: N/A;   KYPHOPLASTY N/A 08/29/2019   Procedure: L2 KYPHOPLASTY;  Surgeon: Hessie Knows, MD;  Location: [facility] ORS;  Service: Orthopedics;  Laterality: N/A;   KYPHOPLASTY N/A 10/10/2019   Procedure: L3 KYPHOPLASTY;  Surgeon: Hessie Knows, MD;  Location: [facility] ORS;  Service: Orthopedics;  Laterality: N/A;   open heart surgery  2019   valve replacement, aortic arch  aneurysm repair   TONSILLECTOMY     TUBAL LIGATION     WRIST RECONSTRUCTION Right     There were no vitals filed for this visit.      TREATMENT: Warm up on  Nustep BUE/BLE level 4 x4 min (unbilled);   Instructed patient in balance exercise: Standing on airex pad:             Feet together, unsupported 30 sec hold, no sway  Progressed:   Red tband BUE low row x10 reps   Red tband BUE shoulder extension x10 reps;  Forward/backward step airex to airex with intermittent rail assist x10 reps   Standing one foot on each airex (feet apart): Side/side weight shift reaching outside base of support x10 reps each with CGA for safety     -Alternate toe taps to 6 inch step x10 reps unsupported, min A for safety, required increased time;  -progressed to standing one foot on airex, one foot on 6 inch step:             Unsupported x20 sec hold, minimal instability  Patient  exhibits better stance control with less unsteadiness in static stance. She does exhibit mild ankle instability with dynamic movement with toe taps;      Exercise: Standing on bottom step:  Hip flexion march against green tband x10 reps (toe tap to 3rd step) each LE; required BUE rail assist;    Tolerated session well. Does report mild fatigue at end of session;  PT Short Term Goals - 10/21/21 1359       PT SHORT TERM GOAL #1   Title Pt will be independent with HEP to improve posture and balance for ADL completion    Baseline 08/11/21: initiated; Has been doing her HEP, no questions (09/15/2021) 4/13: doing well    Time 10    Period Weeks    Status Achieved    Target Date 10/20/21               PT Long Term Goals - 10/21/21 1359       PT LONG TERM GOAL #1   Title Pt will improve FOTO to target score of 54 to demonstrate clinically significant improvement in functional mobility    Baseline 08/11/21: 43; 51 (09/15/2021)    Time 8    Period Weeks    Status Partially Met    Target Date 12/16/21      PT LONG TERM GOAL #2   Title Pt will improve 5xSTS to <15 sec without UE support to demonstrate clinically significant  improvement in strength and reduce fall risk.    Baseline 08/11/21: 23.08 sec with UE's on arm rests; 23 seconds with UE's on arm rests (09/15/2021), 4/13: 17.07 sec without UE assist;    Time 8    Period Weeks    Status Revised    Target Date 12/16/21      PT LONG TERM GOAL #3   Title Pt will improve DGI to 19 to demonstrate clinicially significant improvement in falls reduction with community ambulation tasks.    Baseline 08/11/21: 11/24, 4/13: deferred due to time    Time 8    Period Weeks    Status Deferred    Target Date 12/16/21      PT LONG TERM GOAL #4   Title Pt will improve normal walking speed to > 0.8 m/s to decrease risk of falls with household walking tasks.    Baseline 08/11/21: 0.58 m/s; 0.71 m/s with rollator walker (09/15/2021), 4/13: 0.69 m/s (regular pace);    Time 8    Period Weeks    Status On-going    Target Date 12/16/21      PT LONG TERM GOAL #5   Title Pt will improve Berg balance by 8 points to display clinically significant improvement in reduced fall risk with ADL's    Baseline 08/18/21: 40/56; 43/56 (09/15/2021), 4/13: 49/56    Time 8    Period Weeks    Status Achieved    Target Date 12/16/21                    Patient will benefit from skilled therapeutic intervention in order to improve the following deficits and impairments:     Visit Diagnosis: No diagnosis found.     Problem List Patient Active Problem List   Diagnosis Date Noted   Femur fracture, right, closed, initial encounter 11/25/2015    Anissa Abbs, PT 11/30/2021, 1:17 PM  [place] MAIN [facility] SERVICES [address] [place], [place], [address] Phone: [phone]   Fax:  [phone]  Name: CATHARINA PICA MRN: [id number] Date of Birth: October 01, 1940

## 2021-12-01 ENCOUNTER — Encounter: Payer: Self-pay | Admitting: Physical Therapy

## 2021-12-01 ENCOUNTER — Ambulatory Visit: Payer: Medicare HMO

## 2021-12-02 ENCOUNTER — Ambulatory Visit: Payer: Medicare HMO | Admitting: Physical Therapy

## 2021-12-02 ENCOUNTER — Encounter: Payer: Self-pay | Admitting: Physical Therapy

## 2021-12-02 DIAGNOSIS — R293 Abnormal posture: Secondary | ICD-10-CM

## 2021-12-02 DIAGNOSIS — R269 Unspecified abnormalities of gait and mobility: Secondary | ICD-10-CM | POA: Diagnosis not present

## 2021-12-02 DIAGNOSIS — R262 Difficulty in walking, not elsewhere classified: Secondary | ICD-10-CM

## 2021-12-02 DIAGNOSIS — R2681 Unsteadiness on feet: Secondary | ICD-10-CM | POA: Diagnosis not present

## 2021-12-02 DIAGNOSIS — M6281 Muscle weakness (generalized): Secondary | ICD-10-CM | POA: Diagnosis not present

## 2021-12-02 NOTE — Therapy (Signed)
[place] MAIN [facility] SERVICES [address] [place], [place], [address] Phone: [phone]   Fax:  [phone]  Physical Therapy Treatment Physical Therapy Progress Note   Dates of reporting period  10/21/21   to   12/02/21   Patient Details  Name: April Rogers MRN: [id number] Date of Birth: [birth year] Referring Provider (PT): Duanne Guess, [place]   Encounter Date: 12/02/2021   PT End of Session - 12/02/21 1432     Visit Number 30    Number of Visits 37    Date for PT Re-Evaluation 12/17/21    Authorization Type [employer]    Authorization Time Period 10/21/21-01/07/22 for 20 visits    Authorization - Visit Number 6    Authorization - Number of Visits 20    Progress Note Due on Visit 30    PT Start Time 1432    PT Stop Time 1515    PT Time Calculation (min) 43 min    Equipment Utilized During Treatment Gait belt    Activity Tolerance Patient tolerated treatment well;No increased pain;Patient limited by fatigue    Behavior During Therapy [facility] for tasks assessed/performed             Past Medical History:  Diagnosis Date   Anxiety    Arthritis    Cancer ([place])    skin cancer   Complication of anesthesia    general anesthesia hard on memory after pt goes home   Depression    Fracture of one rib    GERD (gastroesophageal reflux disease)    was on nexium but no longer needs to take this medication   Hypertension    Macular degeneration of both eyes    Osteoporosis     Past Surgical History:  Procedure Laterality Date   ARM SKIN LESION BIOPSY / EXCISION Right    CHOLECYSTECTOMY     COLONOSCOPY WITH PROPOFOL N/A 08/03/2015   Procedure: COLONOSCOPY WITH PROPOFOL;  Surgeon: Hulen Luster, MD;  Location: [facility] ENDOSCOPY;  Service: Gastroenterology;  Laterality: N/A;   INTRAMEDULLARY (IM) NAIL INTERTROCHANTERIC Right 11/26/2015   Procedure: INTRAMEDULLARY (IM) NAIL INTERTROCHANTRIC;  Surgeon: Earnestine Leys, MD;  Location: [facility] ORS;   Service: Orthopedics;  Laterality: Right;   KYPHOPLASTY N/A 08/16/2019   Procedure: L4 KYPHOPLASTY;  Surgeon: Hessie Knows, MD;  Location: [facility] ORS;  Service: Orthopedics;  Laterality: N/A;   KYPHOPLASTY N/A 08/29/2019   Procedure: L2 KYPHOPLASTY;  Surgeon: Hessie Knows, MD;  Location: [facility] ORS;  Service: Orthopedics;  Laterality: N/A;   KYPHOPLASTY N/A 10/10/2019   Procedure: L3 KYPHOPLASTY;  Surgeon: Hessie Knows, MD;  Location: [facility] ORS;  Service: Orthopedics;  Laterality: N/A;   open heart surgery  2019   valve replacement, aortic arch  aneurysm repair   TONSILLECTOMY     TUBAL LIGATION     WRIST RECONSTRUCTION Right     There were no vitals filed for this visit.   Subjective Assessment - 12/02/21 1437     Subjective Pt reports doing well. No pain or soreness;    Pertinent History Pt is a 81 y.o. female referred to PT for gait and imbalance. PMH includes: Anxiety, skin cancer, GERD, HTN, osteoporosis, L2-L4 kyphoplasties 08/29/19, R hip ORIF. Pt reporting for OPPT for gait/imbalance. Was recently receiving OP PT for R shoulder but that has been complete. Pt 's daughter present to assist in history taking. Pt's daughter reports since covid, open heart surgery, recent surgeries causing balance concerns dating from  2019. No falls in last 6 months, 1 fall in the last year. Near falls reported. Pt is a furniture walker, does use rollator in house. Pt endorsing difficulty with vision, obstacle navigation, clearing feet with walking. Home lay out noted in flow sheets. Pt's goal is to improve balance and strength.    Limitations Lifting;Standing;Walking    Patient Stated Goals Improve balance and strength.    Currently in Pain? No/denies    Multiple Pain Sites No                [facility] PT Assessment - 12/02/21 0001       Observation/Other Assessments   Focus on Therapeutic Outcomes (FOTO)  45%      6 Minute Walk- Baseline   6 Minute Walk- Baseline yes    BP (mmHg) 158/70    HR (bpm)  69    02 Sat (%RA) 93 %    Modified Borg Scale for Dyspnea 1- Very mild shortness of breath      6 Minute walk- Post Test   6 Minute Walk Post Test yes    BP (mmHg) 188/86    HR (bpm) 84    02 Sat (%RA) 97 %    Modified Borg Scale for Dyspnea 3- Moderate shortness of breath or breathing difficulty    Perceived Rate of Exertion (Borg) 13- Somewhat hard      6 minute walk test results    Aerobic Endurance Distance Walked 700    Endurance additional comments with rollator, limited community ambulator      Standardized Balance Assessment   Five times sit to stand comments  12.5 sec without UE (<15 sec indicates low risk for falls), improved from 17.07 sec on 10/21/21    10 Meter Walk 0.82 m/s without rollator, improved from 0.69 m/s on 10/21/21 with rollator      Berg Balance Test   Sit to Stand Able to stand without using hands and stabilize independently    Standing Unsupported Able to stand safely 2 minutes    Sitting with Back Unsupported but Feet Supported on Floor or Stool Able to sit safely and securely 2 minutes    Stand to Sit Sits safely with minimal use of hands    Transfers Able to transfer safely, minor use of hands    Standing Unsupported with Eyes Closed Able to stand 10 seconds safely    Standing Unsupported with Feet Together Able to place feet together independently and stand 1 minute safely    From Standing, Reach Forward with Outstretched Arm Can reach confidently >25 cm (10")    From Standing Position, Pick up Object from Floor Able to pick up shoe safely and easily    From Standing Position, Turn to Look Behind Over each Shoulder Looks behind from both sides and weight shifts well    Turn 360 Degrees Able to turn 360 degrees safely in 4 seconds or less    Standing Unsupported, Alternately Place Feet on Step/Stool Able to stand independently and complete 8 steps >20 seconds    Standing Unsupported, One Foot in Front Able to plae foot ahead of the other independently  and hold 30 seconds    Standing on One Leg Able to lift leg independently and hold 5-10 seconds    Total Score 53    Berg comment: --   improved from 49/56 on 10/21/21              TREATMENT: PT instructed patient in outcome  measures, see above;  Patient's condition has the potential to improve in response to therapy. Maximum improvement is yet to be obtained. The anticipated improvement is attainable and reasonable in a generally predictable time.  Patient reports adherence with HEP. She reports feeling stronger and overall improved mobility;                      PT Education - 12/02/21 1432     Education Details progress towards goals/plan    Person(s) Educated Patient;Child(ren)    Methods Explanation;Verbal cues    Comprehension Verbalized understanding;Returned demonstration;Verbal cues required;Need further instruction              PT Short Term Goals - 10/21/21 1359       PT SHORT TERM GOAL #1   Title Pt will be independent with HEP to improve posture and balance for ADL completion    Baseline 08/11/21: initiated; Has been doing her HEP, no questions (09/15/2021) 4/13: doing well    Time 10    Period Weeks    Status Achieved    Target Date 10/20/21               PT Long Term Goals - 12/02/21 1530       PT LONG TERM GOAL #1   Title Pt will improve FOTO to target score of 54 to demonstrate clinically significant improvement in functional mobility    Baseline 08/11/21: 43; 51 (09/15/2021), 5/25: 45%    Time 8    Period Weeks    Status Partially Met    Target Date 12/16/21      PT LONG TERM GOAL #2   Title Pt will improve 5xSTS to <15 sec without UE support to demonstrate clinically significant improvement in strength and reduce fall risk.    Baseline 08/11/21: 23.08 sec with UE's on arm rests; 23 seconds with UE's on arm rests (09/15/2021), 4/13: 17.07 sec without UE assist; 5/25: 12.5 sec    Time 8    Period Weeks    Status Achieved     Target Date 12/16/21      PT LONG TERM GOAL #3   Title Pt will improve DGI to 19 to demonstrate clinicially significant improvement in falls reduction with community ambulation tasks.    Baseline 08/11/21: 11/24, 4/13: deferred due to time, 5/25: deferred;    Time 8    Period Weeks    Status Deferred    Target Date 12/16/21      PT LONG TERM GOAL #4   Title Pt will improve normal walking speed to > 0.8 m/s to decrease risk of falls with household walking tasks.    Baseline 08/11/21: 0.58 m/s; 0.71 m/s with rollator walker (09/15/2021), 4/13: 0.69 m/s (regular pace); 5/25: 0.84 m/s without rollator    Time 8    Period Weeks    Status Achieved    Target Date 12/16/21      PT LONG TERM GOAL #5   Title Pt will improve Berg balance by 8 points to display clinically significant improvement in reduced fall risk with ADL's    Baseline 08/18/21: 40/56; 43/56 (09/15/2021), 4/13: 49/56, 5/25: 53/56    Time 8    Period Weeks    Status Achieved    Target Date 12/16/21                   Plan - 12/02/21 1528     Clinical Impression Statement Patient motivated and participated  well within session. She was instructed in outcome measures to address progress towards goals. Patient exhibits significant improvement in all areas and is testing as a low risk for falls. She does exhibit decreased cardiovascular conditioning with limited 6 min walk test distance and increased BP after walking. She would benefit from additional skilled PT intervention to improve cardiovascular conditioning and overall strength and mobility;    Personal Factors and Comorbidities Age;Time since onset of injury/illness/exacerbation;Comorbidity 3+;Past/Current Experience    Comorbidities PMH inc;ludes: Anxiety, skin cancer, GERD, HTN, osteoporosis, L2-L4 kyphoplasties 08/29/19, R hip ORIF.    Examination-Activity Limitations Stairs;Bend;Locomotion Level;Stand;Reach Overhead;Carry;Transfers;Squat    Examination-Participation  Restrictions Shop;Community Activity    Stability/Clinical Decision Making Stable/Uncomplicated    Rehab Potential Fair    PT Frequency 2x / week    PT Duration 8 weeks    PT Treatment/Interventions ADLs/Self Care Home Management;DME Instruction;Gait training;Stair training;Functional mobility training;Therapeutic activities;Therapeutic exercise;Balance training;Neuromuscular re-education;Patient/family education    PT Next Visit Plan Progress balance as appropriate next session.    PT Home Exercise Plan Access Code [email or web address] with scap retractions for posture; no updates    Consulted and Agree with Plan of Care Patient;Family member/caregiver    Family Member Consulted Daughter             Patient will benefit from skilled therapeutic intervention in order to improve the following deficits and impairments:  Abnormal gait, Improper body mechanics, Decreased mobility, Postural dysfunction, Decreased activity tolerance, Decreased balance, Decreased strength, Difficulty walking  Visit Diagnosis: Difficulty in walking, not elsewhere classified  Unsteadiness on feet  Muscle weakness (generalized)  Abnormality of gait and mobility  Abnormal posture     Problem List Patient Active Problem List   Diagnosis Date Noted   Femur fracture, right, closed, initial encounter 11/25/2015    Ying Blankenhorn, PT, DPT 12/02/2021, 3:32 PM  [place] [address] [place], [place], [address] Phone: [phone]   Fax:  [phone]  Name: CHAYIL GANTT MRN: [id number] Date of Birth: 09/16/1940

## 2021-12-03 ENCOUNTER — Ambulatory Visit: Payer: Medicare HMO

## 2021-12-08 ENCOUNTER — Encounter: Payer: Self-pay | Admitting: Physical Therapy

## 2021-12-08 ENCOUNTER — Ambulatory Visit: Payer: Medicare HMO | Admitting: Physical Therapy

## 2021-12-08 DIAGNOSIS — R2681 Unsteadiness on feet: Secondary | ICD-10-CM | POA: Diagnosis not present

## 2021-12-08 DIAGNOSIS — R269 Unspecified abnormalities of gait and mobility: Secondary | ICD-10-CM

## 2021-12-08 DIAGNOSIS — R262 Difficulty in walking, not elsewhere classified: Secondary | ICD-10-CM | POA: Diagnosis not present

## 2021-12-08 DIAGNOSIS — M6281 Muscle weakness (generalized): Secondary | ICD-10-CM

## 2021-12-08 DIAGNOSIS — R293 Abnormal posture: Secondary | ICD-10-CM | POA: Diagnosis not present

## 2021-12-08 NOTE — Therapy (Signed)
[place] MAIN [facility] SERVICES [address] [place], [place], [address] Phone: [phone]   Fax:  [phone]  Physical Therapy Treatment  Patient Details  Name: April Rogers MRN: [id number] Date of Birth: 30-May-1941 Referring Provider (PT): Duanne Guess, [place]   Encounter Date: 12/08/2021   PT End of Session - 12/08/21 1157     Visit Number 31    Number of Visits 37    Date for PT Re-Evaluation 12/17/21    Authorization Type [employer]    Authorization Time Period 10/21/21-01/07/22 for 20 visits    Authorization - Visit Number 6    Authorization - Number of Visits 20    Progress Note Due on Visit 30    PT Start Time 1150    PT Stop Time 1230    PT Time Calculation (min) 40 min    Equipment Utilized During Treatment Gait belt    Activity Tolerance Patient tolerated treatment well;No increased pain;Patient limited by fatigue    Behavior During Therapy [facility] for tasks assessed/performed             Past Medical History:  Diagnosis Date   Anxiety    Arthritis    Cancer ([place])    skin cancer   Complication of anesthesia    general anesthesia hard on memory after pt goes home   Depression    Fracture of one rib    GERD (gastroesophageal reflux disease)    was on nexium but no longer needs to take this medication   Hypertension    Macular degeneration of both eyes    Osteoporosis     Past Surgical History:  Procedure Laterality Date   ARM SKIN LESION BIOPSY / EXCISION Right    CHOLECYSTECTOMY     COLONOSCOPY WITH PROPOFOL N/A 08/03/2015   Procedure: COLONOSCOPY WITH PROPOFOL;  Surgeon: Hulen Luster, MD;  Location: [facility] ENDOSCOPY;  Service: Gastroenterology;  Laterality: N/A;   INTRAMEDULLARY (IM) NAIL INTERTROCHANTERIC Right 11/26/2015   Procedure: INTRAMEDULLARY (IM) NAIL INTERTROCHANTRIC;  Surgeon: Earnestine Leys, MD;  Location: [facility] ORS;  Service: Orthopedics;  Laterality: Right;   KYPHOPLASTY N/A 08/16/2019   Procedure: L4  KYPHOPLASTY;  Surgeon: Hessie Knows, MD;  Location: [facility] ORS;  Service: Orthopedics;  Laterality: N/A;   KYPHOPLASTY N/A 08/29/2019   Procedure: L2 KYPHOPLASTY;  Surgeon: Hessie Knows, MD;  Location: [facility] ORS;  Service: Orthopedics;  Laterality: N/A;   KYPHOPLASTY N/A 10/10/2019   Procedure: L3 KYPHOPLASTY;  Surgeon: Hessie Knows, MD;  Location: [facility] ORS;  Service: Orthopedics;  Laterality: N/A;   open heart surgery  2019   valve replacement, aortic arch  aneurysm repair   TONSILLECTOMY     TUBAL LIGATION     WRIST RECONSTRUCTION Right     There were no vitals filed for this visit.   Subjective Assessment - 12/08/21 1156     Subjective Patient reports having increased RUE shoulder discomfort over the weekend. She has an appointment on Friday for a steroid injection. No other new complaints. Patient reports feeling increased fatigue staying up and trying to watch the race over the weekend    Pertinent History Pt is a 81 y.o. female referred to PT for gait and imbalance. PMH includes: Anxiety, skin cancer, GERD, HTN, osteoporosis, L2-L4 kyphoplasties 08/29/19, R hip ORIF. Pt reporting for OPPT for gait/imbalance. Was recently receiving OP PT for R shoulder but that has been complete. Pt 's daughter present to assist in history taking. Pt's daughter  reports since covid, open heart surgery, recent surgeries causing balance concerns dating from 2019. No falls in last 6 months, 1 fall in the last year. Near falls reported. Pt is a furniture walker, does use rollator in house. Pt endorsing difficulty with vision, obstacle navigation, clearing feet with walking. Home lay out noted in flow sheets. Pt's goal is to improve balance and strength.    Limitations Lifting;Standing;Walking    Patient Stated Goals Improve balance and strength.    Currently in Pain? No/denies    Multiple Pain Sites No                     TREATMENT: Ex: Warm up on Nustep BUE/BLE level  2-4, interval training x5 min  with cues to keep steps per minute >75 x5 min; Pt often keeps speed at 60 spm; Vitals after exercise,: HR 84, SPO2 initially read 84%, but concerned red nail polish is affecting accuracy, on another finger SPO2 97%  Pt instructed in ascend/descend 4 steps with BUE rail assist x3 sets with forward reciprocal pattern, CGA for safety. Minimal challenge; Does require cues to increase step length for better foot clearance and improve foot placement;  Standing on firm surface: Heel/toe raises unsupported x15 reps with CGA for safety;    Standing with red tband around BLE: -side stepping x10 feet x2 laps each direction with BUE rail assist, required min VCS for proper positioning and to increase erect posture;    Standing with 3# ankle weights: -Hip abduction x10 reps -hip extension x10 reps Patient required min-moderate verbal/tactile cues for correct exercise technique including cues for proper positioning;  Forward/backward step over 1/2 bolster x10 reps with 1 rail assist Side stepping over 1/2 bolster x10 reps each direction with BUE rail assist Pt requires cues to increase step length for better foot clearance;  Forward/backward red mat x5 reps Side stepping red mat x3 reps Required CGA for safety and cues to increase step length for gait safety;     Tolerated session well. Does report mild fatigue at end of session;                                    PT Education - 12/08/21 1157     Education Details exercise technique/positioning;    Person(s) Educated Patient    Methods Explanation;Verbal cues    Comprehension Verbalized understanding;Returned demonstration;Verbal cues required;Need further instruction              PT Short Term Goals - 10/21/21 1359       PT SHORT TERM GOAL #1   Title Pt will be independent with HEP to improve posture and balance for ADL completion    Baseline 08/11/21: initiated; Has been doing her HEP, no questions (09/15/2021)  4/13: doing well    Time 10    Period Weeks    Status Achieved    Target Date 10/20/21               PT Long Term Goals - 12/02/21 1530       PT LONG TERM GOAL #1   Title Pt will improve FOTO to target score of 54 to demonstrate clinically significant improvement in functional mobility    Baseline 08/11/21: 43; 51 (09/15/2021), 5/25: 45%    Time 8    Period Weeks    Status Partially Met    Target Date 12/16/21  PT LONG TERM GOAL #2   Title Pt will improve 5xSTS to <15 sec without UE support to demonstrate clinically significant improvement in strength and reduce fall risk.    Baseline 08/11/21: 23.08 sec with UE's on arm rests; 23 seconds with UE's on arm rests (09/15/2021), 4/13: 17.07 sec without UE assist; 5/25: 12.5 sec    Time 8    Period Weeks    Status Achieved    Target Date 12/16/21      PT LONG TERM GOAL #3   Title Pt will improve DGI to 19 to demonstrate clinicially significant improvement in falls reduction with community ambulation tasks.    Baseline 08/11/21: 11/24, 4/13: deferred due to time, 5/25: deferred;    Time 8    Period Weeks    Status Deferred    Target Date 12/16/21      PT LONG TERM GOAL #4   Title Pt will improve normal walking speed to > 0.8 m/s to decrease risk of falls with household walking tasks.    Baseline 08/11/21: 0.58 m/s; 0.71 m/s with rollator walker (09/15/2021), 4/13: 0.69 m/s (regular pace); 5/25: 0.84 m/s without rollator    Time 8    Period Weeks    Status Achieved    Target Date 12/16/21      PT LONG TERM GOAL #5   Title Pt will improve Berg balance by 8 points to display clinically significant improvement in reduced fall risk with ADL's    Baseline 08/18/21: 40/56; 43/56 (09/15/2021), 4/13: 49/56, 5/25: 53/56    Time 8    Period Weeks    Status Achieved    Target Date 12/16/21                   Plan - 12/08/21 1254     Clinical Impression Statement Patient motivated and participated well within session. She was  instructed in advanced strengthening and cardiovascular conditioning exercise. Patient's vitals monitored throughout session. Initially Spo2 levels were low but concerned of accuracy as patient has painted fingernails. Patient denies any significant shortness of breath. She was able to progress with gait on unstable surface unsupported with CGA. Patient does require cues to increase step length especially with posterior stepping. Patient would benefit from additional skilled PT Intervention to improve strength, balance and mobility;    Personal Factors and Comorbidities Age;Time since onset of injury/illness/exacerbation;Comorbidity 3+;Past/Current Experience    Comorbidities PMH inc;ludes: Anxiety, skin cancer, GERD, HTN, osteoporosis, L2-L4 kyphoplasties 08/29/19, R hip ORIF.    Examination-Activity Limitations Stairs;Bend;Locomotion Level;Stand;Reach Overhead;Carry;Transfers;Squat    Examination-Participation Restrictions Shop;Community Activity    Stability/Clinical Decision Making Stable/Uncomplicated    Rehab Potential Fair    PT Frequency 2x / week    PT Duration 8 weeks    PT Treatment/Interventions ADLs/Self Care Home Management;DME Instruction;Gait training;Stair training;Functional mobility training;Therapeutic activities;Therapeutic exercise;Balance training;Neuromuscular re-education;Patient/family education    PT Next Visit Plan Progress balance as appropriate next session.    PT Home Exercise Plan Access Code [email or web address] with scap retractions for posture; no updates    Consulted and Agree with Plan of Care Patient;Family member/caregiver    Family Member Consulted Daughter             Patient will benefit from skilled therapeutic intervention in order to improve the following deficits and impairments:  Abnormal gait, Improper body mechanics, Decreased mobility, Postural dysfunction, Decreased activity tolerance, Decreased balance, Decreased strength, Difficulty walking  Visit  Diagnosis: Difficulty in walking, not elsewhere classified  Unsteadiness on  feet  Muscle weakness (generalized)  Abnormality of gait and mobility  Abnormal posture     Problem List Patient Active Problem List   Diagnosis Date Noted   Femur fracture, right, closed, initial encounter 11/25/2015    Aaleeyah Bias, PT, DPT 12/08/2021, 12:57 PM  [place] MAIN [facility] SERVICES [address] [place], [place], [address] Phone: [phone]   Fax:  [phone]  Name: ADISYN RUSCITTI MRN: [id number] Date of Birth: 11/02/1940

## 2021-12-13 ENCOUNTER — Ambulatory Visit: Payer: Medicare HMO | Attending: Orthopedic Surgery

## 2021-12-13 DIAGNOSIS — R269 Unspecified abnormalities of gait and mobility: Secondary | ICD-10-CM | POA: Diagnosis not present

## 2021-12-13 DIAGNOSIS — M6281 Muscle weakness (generalized): Secondary | ICD-10-CM | POA: Diagnosis not present

## 2021-12-13 DIAGNOSIS — R262 Difficulty in walking, not elsewhere classified: Secondary | ICD-10-CM | POA: Insufficient documentation

## 2021-12-13 DIAGNOSIS — R293 Abnormal posture: Secondary | ICD-10-CM | POA: Diagnosis not present

## 2021-12-13 DIAGNOSIS — R2681 Unsteadiness on feet: Secondary | ICD-10-CM | POA: Insufficient documentation

## 2021-12-13 NOTE — Therapy (Signed)
[place] MAIN [facility] SERVICES [address] [place], [place], [address] Phone: [phone]   Fax:  [phone]  Physical Therapy Treatment  Patient Details  Name: April Rogers MRN: [id number] Date of Birth: 09-23-1940 Referring Provider (PT): Duanne Guess, [place]   Encounter Date: 12/13/2021   PT End of Session - 12/13/21 1309     Visit Number 32    Number of Visits 37    Date for PT Re-Evaluation 12/17/21    Authorization Type [employer]    Authorization Time Period 10/21/21-01/07/22 for 20 visits    Authorization - Visit Number 20    Authorization - Number of Visits 20    Progress Note Due on Visit 40    PT Start Time 1301    PT Stop Time 1340    PT Time Calculation (min) 39 min    Equipment Utilized During Treatment Gait belt    Activity Tolerance Patient tolerated treatment well;No increased pain;Patient limited by fatigue    Behavior During Therapy [facility] for tasks assessed/performed             Past Medical History:  Diagnosis Date   Anxiety    Arthritis    Cancer ([place])    skin cancer   Complication of anesthesia    general anesthesia hard on memory after pt goes home   Depression    Fracture of one rib    GERD (gastroesophageal reflux disease)    was on nexium but no longer needs to take this medication   Hypertension    Macular degeneration of both eyes    Osteoporosis     Past Surgical History:  Procedure Laterality Date   ARM SKIN LESION BIOPSY / EXCISION Right    CHOLECYSTECTOMY     COLONOSCOPY WITH PROPOFOL N/A 08/03/2015   Procedure: COLONOSCOPY WITH PROPOFOL;  Surgeon: Hulen Luster, MD;  Location: [facility] ENDOSCOPY;  Service: Gastroenterology;  Laterality: N/A;   INTRAMEDULLARY (IM) NAIL INTERTROCHANTERIC Right 11/26/2015   Procedure: INTRAMEDULLARY (IM) NAIL INTERTROCHANTRIC;  Surgeon: Earnestine Leys, MD;  Location: [facility] ORS;  Service: Orthopedics;  Laterality: Right;   KYPHOPLASTY N/A 08/16/2019   Procedure: L4  KYPHOPLASTY;  Surgeon: Hessie Knows, MD;  Location: [facility] ORS;  Service: Orthopedics;  Laterality: N/A;   KYPHOPLASTY N/A 08/29/2019   Procedure: L2 KYPHOPLASTY;  Surgeon: Hessie Knows, MD;  Location: [facility] ORS;  Service: Orthopedics;  Laterality: N/A;   KYPHOPLASTY N/A 10/10/2019   Procedure: L3 KYPHOPLASTY;  Surgeon: Hessie Knows, MD;  Location: [facility] ORS;  Service: Orthopedics;  Laterality: N/A;   open heart surgery  2019   valve replacement, aortic arch  aneurysm repair   TONSILLECTOMY     TUBAL LIGATION     WRIST RECONSTRUCTION Right     There were no vitals filed for this visit.   Subjective Assessment - 12/13/21 1307     Subjective Pt had a good weekend, got to see some distance family. Her shoulder is feleing better, but she is still sheduled for a steroid injection on this coming Friday.    Pertinent History Pt is a 81 y.o. female referred to PT for gait and imbalance. PMH includes: Anxiety, skin cancer, GERD, HTN, osteoporosis, L2-L4 kyphoplasties 08/29/19, R hip ORIF. Pt reporting for OPPT for gait/imbalance. Was recently receiving OP PT for R shoulder but that has been complete. Pt 's daughter present to assist in history taking. Pt's daughter reports since covid, open heart surgery, recent surgeries causing balance concerns  dating from 2019. No falls in last 6 months, 1 fall in the last year. Near falls reported. Pt is a furniture walker, does use rollator in house. Pt endorsing difficulty with vision, obstacle navigation, clearing feet with walking. Home lay out noted in flow sheets. Pt's goal is to improve balance and strength.    Currently in Pain? No/denies             TREATMENT: Cardiovasular conditioning  Nustep BUE/BLE level  2, 6 min with cues to keep steps per minute >75 x5 min;   -Heel/toe raises unsupported x15 reps,BUE support: -high knee marching 1x15 bilat -Heel/toe raises unsupported x15 reps,BUE support: -STS from chair 1x10 hands free, chest  crossing  -retro stepping in // bars c 2.5lb ankle weights (working on hip extension strength, retro step righting technique)  -STS from chair 1x10 hands free, handy knees   -lateral side stepping 2.5lb AW bilat 1x10         PT Short Term Goals - 10/21/21 1359       PT SHORT TERM GOAL #1   Title Pt will be independent with HEP to improve posture and balance for ADL completion    Baseline 08/11/21: initiated; Has been doing her HEP, no questions (09/15/2021) 4/13: doing well    Time 10    Period Weeks    Status Achieved    Target Date 10/20/21               PT Long Term Goals - 12/02/21 1530       PT LONG TERM GOAL #1   Title Pt will improve FOTO to target score of 54 to demonstrate clinically significant improvement in functional mobility    Baseline 08/11/21: 43; 51 (09/15/2021), 5/25: 45%    Time 8    Period Weeks    Status Partially Met    Target Date 12/16/21      PT LONG TERM GOAL #2   Title Pt will improve 5xSTS to <15 sec without UE support to demonstrate clinically significant improvement in strength and reduce fall risk.    Baseline 08/11/21: 23.08 sec with UE's on arm rests; 23 seconds with UE's on arm rests (09/15/2021), 4/13: 17.07 sec without UE assist; 5/25: 12.5 sec    Time 8    Period Weeks    Status Achieved    Target Date 12/16/21      PT LONG TERM GOAL #3   Title Pt will improve DGI to 19 to demonstrate clinicially significant improvement in falls reduction with community ambulation tasks.    Baseline 08/11/21: 11/24, 4/13: deferred due to time, 5/25: deferred;    Time 8    Period Weeks    Status Deferred    Target Date 12/16/21      PT LONG TERM GOAL #4   Title Pt will improve normal walking speed to > 0.8 m/s to decrease risk of falls with household walking tasks.    Baseline 08/11/21: 0.58 m/s; 0.71 m/s with rollator walker (09/15/2021), 4/13: 0.69 m/s (regular pace); 5/25: 0.84 m/s without rollator    Time 8    Period Weeks    Status Achieved     Target Date 12/16/21      PT LONG TERM GOAL #5   Title Pt will improve Berg balance by 8 points to display clinically significant improvement in reduced fall risk with ADL's    Baseline 08/18/21: 40/56; 43/56 (09/15/2021), 4/13: 49/56, 5/25: 53/56    Time 8  Period Weeks    Status Achieved    Target Date 12/16/21                   Plan - 12/13/21 1312     Clinical Impression Statement Continued with POC. Transitioned open chair hip exercises to gait based strengthening to incorporate righting strategies and balance in standing. Pt takes a few breaks for recovery after exertion.    Personal Factors and Comorbidities Age;Time since onset of injury/illness/exacerbation;Comorbidity 3+;Past/Current Experience    Comorbidities PMH inc;ludes: Anxiety, skin cancer, GERD, HTN, osteoporosis, L2-L4 kyphoplasties 08/29/19, R hip ORIF.    Examination-Activity Limitations Stairs;Bend;Locomotion Level;Stand;Reach Overhead;Carry;Transfers;Squat    Examination-Participation Restrictions Shop;Community Activity    Stability/Clinical Decision Making Stable/Uncomplicated    Clinical Decision Making Low    Rehab Potential Fair    PT Frequency 2x / week    PT Duration 8 weeks    PT Treatment/Interventions ADLs/Self Care Home Management;DME Instruction;Gait training;Stair training;Functional mobility training;Therapeutic activities;Therapeutic exercise;Balance training;Neuromuscular re-education;Patient/family education    PT Home Exercise Plan Access Code [email or web address] with scap retractions for posture; no updates    Consulted and Agree with Plan of Care Patient             Patient will benefit from skilled therapeutic intervention in order to improve the following deficits and impairments:  Abnormal gait, Improper body mechanics, Decreased mobility, Postural dysfunction, Decreased activity tolerance, Decreased balance, Decreased strength, Difficulty walking  Visit Diagnosis: Difficulty in walking,  not elsewhere classified  Unsteadiness on feet  Muscle weakness (generalized)  Abnormality of gait and mobility  Abnormal posture     Problem List Patient Active Problem List   Diagnosis Date Noted   Femur fracture, right, closed, initial encounter 11/25/2015   1:38 PM, 12/13/21 Etta Grandchild, PT, DPT Physical Therapist - [place] [phone]     Cly[place], PT 12/13/2021, 1:15 PM  [facility] [address] [place], [place], [address] Phone: [phone]   Fax:  [phone]  Name: April Rogers MRN: [id number] Date of Birth: [birth year]

## 2021-12-15 ENCOUNTER — Ambulatory Visit: Payer: Medicare HMO | Admitting: Physical Therapy

## 2021-12-15 ENCOUNTER — Encounter: Payer: Self-pay | Admitting: Physical Therapy

## 2021-12-15 DIAGNOSIS — M6281 Muscle weakness (generalized): Secondary | ICD-10-CM | POA: Diagnosis not present

## 2021-12-15 DIAGNOSIS — R2681 Unsteadiness on feet: Secondary | ICD-10-CM

## 2021-12-15 DIAGNOSIS — R293 Abnormal posture: Secondary | ICD-10-CM

## 2021-12-15 DIAGNOSIS — R269 Unspecified abnormalities of gait and mobility: Secondary | ICD-10-CM

## 2021-12-15 DIAGNOSIS — R262 Difficulty in walking, not elsewhere classified: Secondary | ICD-10-CM | POA: Diagnosis not present

## 2021-12-15 NOTE — Therapy (Signed)
[place] MA[facility] SERVICES [address] [place], [place], [address] Phone: [phone]   Fax:  [phone]  Physical Therapy Treatment  Patient Details  Name: April Rogers MRN: [id number] Date of Birth: [birth year] Referring Provider (PT): Duanne Guess, [place]   Encounter Date: 12/15/2021   PT End of Session - 12/15/21 1257     Visit Number 33    Number of Visits 37    Date for PT Re-Evaluation 12/17/21    Authorization Type [employer]    Authorization Time Period 10/21/21-01/07/22 for 20 visits    Authorization - Visit Number 80    Authorization - Number of Visits 20    Progress Note Due on Visit 27    PT Start Time 1302    PT Stop Time 1345    PT Time Calculation (min) 43 min    Equipment Utilized During Treatment Gait belt    Activity Tolerance Patient tolerated treatment well;No increased pain;Patient limited by fatigue    Behavior During Therapy [facility] for tasks assessed/performed             Past Medical History:  Diagnosis Date   Anxiety    Arthritis    Cancer ([place])    skin cancer   Complication of anesthesia    general anesthesia hard on memory after pt goes home   Depression    Fracture of one rib    GERD (gastroesophageal reflux disease)    was on nexium but no longer needs to take this medication   Hypertension    Macular degeneration of both eyes    Osteoporosis     Past Surgical History:  Procedure Laterality Date   ARM SKIN LESION BIOPSY / EXCISION Right    CHOLECYSTECTOMY     COLONOSCOPY WITH PROPOFOL N/A 08/03/2015   Procedure: COLONOSCOPY WITH PROPOFOL;  Surgeon: Hulen Luster, MD;  Location: [facility] ENDOSCOPY;  Service: Gastroenterology;  Laterality: N/A;   INTRAMEDULLARY (IM) NAIL INTERTROCHANTERIC Right 11/26/2015   Procedure: INTRAMEDULLARY (IM) NAIL INTERTROCHANTRIC;  Surgeon: Earnestine Leys, MD;  Location: [facility] ORS;  Service: Orthopedics;  Laterality: Right;   KYPHOPLASTY N/A 08/16/2019   Procedure: L4  KYPHOPLASTY;  Surgeon: Hessie Knows, MD;  Location: [facility] ORS;  Service: Orthopedics;  Laterality: N/A;   KYPHOPLASTY N/A 08/29/2019   Procedure: L2 KYPHOPLASTY;  Surgeon: Hessie Knows, MD;  Location: [facility] ORS;  Service: Orthopedics;  Laterality: N/A;   KYPHOPLASTY N/A 10/10/2019   Procedure: L3 KYPHOPLASTY;  Surgeon: Hessie Knows, MD;  Location: [facility] ORS;  Service: Orthopedics;  Laterality: N/A;   open heart surgery  2019   valve replacement, aortic arch  aneurysm repair   TONSILLECTOMY     TUBAL LIGATION     WRIST RECONSTRUCTION Right     There were no vitals filed for this visit.   Subjective Assessment - 12/15/21 1302     Subjective patient reports doing well. She had a good weekend with family and is still recovering. She has been doing a lot of sleeping. Daughter reports her PCP is also wanting to keep an eye on patient's BP as its been elevated lately. She is scheduled for cortisone injection this Friday;    Pertinent History Pt is a 81 y.o. female referred to PT for gait and imbalance. PMH includes: Anxiety, skin cancer, GERD, HTN, osteoporosis, L2-L4 kyphoplasties 08/29/19, R hip ORIF. Pt reporting for OPPT for gait/imbalance. Was recently receiving OP PT for R shoulder but that has been complete. Pt '  s daughter present to assist in history taking. Pt's daughter reports since covid, open heart surgery, recent surgeries causing balance concerns dating from 2019. No falls in last 6 months, 1 fall in the last year. Near falls reported. Pt is a furniture walker, does use rollator in house. Pt endorsing difficulty with vision, obstacle navigation, clearing feet with walking. Home lay out noted in flow sheets. Pt's goal is to improve balance and strength.    Currently in Pain? No/denies    Multiple Pain Sites No                  TREATMENT: BP at start of session: 140/70, HR 63 in LUE seated;   Ex: Warm up on Nustep BUE/BLE level  2-4, interval training x5 min with cues to keep  steps per minute >75 x4 min; Pt often keeps speed at 60 spm; completed 0.12 miles Vitals after exercise,:BP:  177/76, taken in LUE seated, HR 67 After 1 min rest break: BP: 155/79, HR 60  Standing in parallel bars: Standing on airex pad: -unsupported heel/toe raises x15 reps, CGA for safety; -march in place unsupported x30 sec (x10 reps) with CGA for safety;  Side step firm-foam-firm with intermittent rail assist x5 reps each direction;  with occasional mis-step/foot drag requiring min A and increased attempts for foot clearance;     Standing with red tband around BLE: -side stepping x10 feet x1 laps each direction with BUE rail assist, required min VCS for proper positioning and to increase erect posture;  BP after side stepping: 156/71      Tolerated session fair. Patient more fatigued today.                           PT Education - 12/15/21 1257     Education Details exercise technique/positioning    Person(s) Educated Patient    Methods Explanation;Verbal cues    Comprehension Verbalized understanding;Returned demonstration;Verbal cues required;Need further instruction              PT Short Term Goals - 10/21/21 1359       PT SHORT TERM GOAL #1   Title Pt will be independent with HEP to improve posture and balance for ADL completion    Baseline 08/11/21: initiated; Has been doing her HEP, no questions (09/15/2021) 4/13: doing well    Time 10    Period Weeks    Status Achieved    Target Date 10/20/21               PT Long Term Goals - 12/02/21 1530       PT LONG TERM GOAL #1   Title Pt will improve FOTO to target score of 54 to demonstrate clinically significant improvement in functional mobility    Baseline 08/11/21: 43; 51 (09/15/2021), 5/25: 45%    Time 8    Period Weeks    Status Partially Met    Target Date 12/16/21      PT LONG TERM GOAL #2   Title Pt will improve 5xSTS to <15 sec without UE support to demonstrate clinically  significant improvement in strength and reduce fall risk.    Baseline 08/11/21: 23.08 sec with UE's on arm rests; 23 seconds with UE's on arm rests (09/15/2021), 4/13: 17.07 sec without UE assist; 5/25: 12.5 sec    Time 8    Period Weeks    Status Achieved    Target Date 12/16/21  PT LONG TERM GOAL #3   Title Pt will improve DGI to 19 to demonstrate clinicially significant improvement in falls reduction with community ambulation tasks.    Baseline 08/11/21: 11/24, 4/13: deferred due to time, 5/25: deferred;    Time 8    Period Weeks    Status Deferred    Target Date 12/16/21      PT LONG TERM GOAL #4   Title Pt will improve normal walking speed to > 0.8 m/s to decrease risk of falls with household walking tasks.    Baseline 08/11/21: 0.58 m/s; 0.71 m/s with rollator walker (09/15/2021), 4/13: 0.69 m/s (regular pace); 5/25: 0.84 m/s without rollator    Time 8    Period Weeks    Status Achieved    Target Date 12/16/21      PT LONG TERM GOAL #5   Title Pt will improve Berg balance by 8 points to display clinically significant improvement in reduced fall risk with ADL's    Baseline 08/18/21: 40/56; 43/56 (09/15/2021), 4/13: 49/56, 5/25: 53/56    Time 8    Period Weeks    Status Achieved    Target Date 12/16/21                   Plan - 12/15/21 1431     Clinical Impression Statement Patient more fatigue today. she has been trying to recover from recent family outings/visitation. She also reports her BP has been elevated. As a result, vitals were assessed throughout session. patient's systolic does become elevated with minimal activation, however diastolic stayed within normal limits. Patient likely is experiencing elevated BP as a result of deconditioning. Provided several rest breaks throughout session for better recovery/tolerance. She was instructed in advanced LE strengthening, working on reducing rail assist to facilitate increased weight bearing in BLE. She does require CGA to min  A with less rail assist. Patient would benefit from additional skilled PT Intervention to improve strength, balance and mobility;    Personal Factors and Comorbidities Age;Time since onset of injury/illness/exacerbation;Comorbidity 3+;Past/Current Experience    Comorbidities PMH inc;ludes: Anxiety, skin cancer, GERD, HTN, osteoporosis, L2-L4 kyphoplasties 08/29/19, R hip ORIF.    Examination-Activity Limitations Stairs;Bend;Locomotion Level;Stand;Reach Overhead;Carry;Transfers;Squat    Examination-Participation Restrictions Shop;Community Activity    Stability/Clinical Decision Making Stable/Uncomplicated    Rehab Potential Fair    PT Frequency 2x / week    PT Duration 8 weeks    PT Treatment/Interventions ADLs/Self Care Home Management;DME Instruction;Gait training;Stair training;Functional mobility training;Therapeutic activities;Therapeutic exercise;Balance training;Neuromuscular re-education;Patient/family education    PT Home Exercise Plan Access Code [email or web address] with scap retractions for posture; no updates    Consulted and Agree with Plan of Care Patient             Patient will benefit from skilled therapeutic intervention in order to improve the following deficits and impairments:  Abnormal gait, Improper body mechanics, Decreased mobility, Postural dysfunction, Decreased activity tolerance, Decreased balance, Decreased strength, Difficulty walking  Visit Diagnosis: Difficulty in walking, not elsewhere classified  Unsteadiness on feet  Muscle weakness (generalized)  Abnormality of gait and mobility  Abnormal posture     Problem List Patient Active Problem List   Diagnosis Date Noted   Femur fracture, right, closed, initial encounter 11/25/2015    Maximillian Habibi, PT, DPT 12/15/2021, 2:33 PM  [facility] [address] [place], [place], [address] Phone: [phone]   Fax:  [phone]  Name: DOVE GRESHAM MRN: [id number] Date  of Birth: 1941/01/21

## 2021-12-15 NOTE — Patient Instructions (Addendum)
BP at start of session: 140/70, HR 63 in LUE seated;  After exercising for 4 min on recumbent bike,  BP: 177/76, taken in LUE seated, HR 67 After 1 min rest break: BP: 155/79, HR 60  After exercising in parallel bars: 156/71

## 2021-12-17 DIAGNOSIS — M19011 Primary osteoarthritis, right shoulder: Secondary | ICD-10-CM | POA: Diagnosis not present

## 2021-12-17 DIAGNOSIS — M75101 Unspecified rotator cuff tear or rupture of right shoulder, not specified as traumatic: Secondary | ICD-10-CM | POA: Diagnosis not present

## 2021-12-20 ENCOUNTER — Encounter: Payer: Self-pay | Admitting: Physical Therapy

## 2021-12-20 ENCOUNTER — Ambulatory Visit: Payer: Medicare HMO | Admitting: Physical Therapy

## 2021-12-20 DIAGNOSIS — M6281 Muscle weakness (generalized): Secondary | ICD-10-CM | POA: Diagnosis not present

## 2021-12-20 DIAGNOSIS — R2681 Unsteadiness on feet: Secondary | ICD-10-CM

## 2021-12-20 DIAGNOSIS — R269 Unspecified abnormalities of gait and mobility: Secondary | ICD-10-CM | POA: Diagnosis not present

## 2021-12-20 DIAGNOSIS — R293 Abnormal posture: Secondary | ICD-10-CM | POA: Diagnosis not present

## 2021-12-20 DIAGNOSIS — R262 Difficulty in walking, not elsewhere classified: Secondary | ICD-10-CM

## 2021-12-20 NOTE — Therapy (Signed)
[place] MAIN [facility] SERVICES [address] [place], [place], [address] Phone: [phone]   Fax:  [phone]  Physical Therapy Treatment  Patient Details  Name: April Rogers MRN: [id number] Date of Birth: September 23, 1940 Referring Provider (PT): Duanne Guess, [place]   Encounter Date: 12/20/2021   PT End of Session - 12/20/21 1308     Visit Number 34    Number of Visits 67    Date for PT Re-Evaluation 02/15/22    Authorization Type [employer]    Authorization Time Period 10/21/21-01/07/22 for 20 visits    Authorization - Visit Number 18    Authorization - Number of Visits 20    Progress Note Due on Visit 13    PT Start Time 1302    PT Stop Time 1345    PT Time Calculation (min) 43 min    Equipment Utilized During Treatment Gait belt    Activity Tolerance Patient tolerated treatment well;No increased pain;Patient limited by fatigue    Behavior During Therapy [facility] for tasks assessed/performed             Past Medical History:  Diagnosis Date   Anxiety    Arthritis    Cancer ([place])    skin cancer   Complication of anesthesia    general anesthesia hard on memory after pt goes home   Depression    Fracture of one rib    GERD (gastroesophageal reflux disease)    was on nexium but no longer needs to take this medication   Hypertension    Macular degeneration of both eyes    Osteoporosis     Past Surgical History:  Procedure Laterality Date   ARM SKIN LESION BIOPSY / EXCISION Right    CHOLECYSTECTOMY     COLONOSCOPY WITH PROPOFOL N/A 08/03/2015   Procedure: COLONOSCOPY WITH PROPOFOL;  Surgeon: Hulen Luster, MD;  Location[facility] ENDOSCOPY;  Service: Gastroenterology;  Laterality: N/A;   INTRAMEDULLARY (IM) NAIL INTERTROCHANTERIC Right 11/26/2015   Procedure: INTRAMEDULLARY (IM) NAIL INTERTROCHANTRIC;  Surgeon: Earnestine Leys, MD;  Location: [facility] ORS;  Service: Orthopedics;  Laterality: Right;   KYPHOPLASTY N/A 08/16/2019   Procedure: L4  KYPHOPLASTY;  Surgeon: Hessie Knows, MD;  Location: [facility] ORS;  Service: Orthopedics;  Laterality: N/A;   KYPHOPLASTY N/A 08/29/2019   Procedure: L2 KYPHOPLASTY;  Surgeon: Hessie Knows, MD;  Location: [facility] ORS;  Service: Orthopedics;  Laterality: N/A;   KYPHOPLASTY N/A 10/10/2019   Procedure: L3 KYPHOPLASTY;  Surgeon: Hessie Knows, MD;  Location: [facility] ORS;  Service: Orthopedics;  Laterality: N/A;   open heart surgery  2019   valve replacement, aortic arch  aneurysm repair   TONSILLECTOMY     TUBAL LIGATION     WRIST RECONSTRUCTION Right     There were no vitals filed for this visit.   Subjective Assessment - 12/20/21 1307     Subjective Pt reports doing well. Patient reports her shoulder is doing well after injection last week (Fri). She reports being less fatigued. Her BP has been better, daughter has been checking it at home.    Pertinent History Pt is a 81 y.o. female referred to PT for gait and imbalance. PMH includes: Anxiety, skin cancer, GERD, HTN, osteoporosis, L2-L4 kyphoplasties 08/29/19, R hip ORIF. Pt reporting for OPPT for gait/imbalance. Was recently receiving OP PT for R shoulder but that has been complete. Pt 's daughter present to assist in history taking. Pt's daughter reports since covid, open heart surgery, recent  surgeries causing balance concerns dating from 2019. No falls in last 6 months, 1 fall in the last year. Near falls reported. Pt is a furniture walker, does use rollator in house. Pt endorsing difficulty with vision, obstacle navigation, clearing feet with walking. Home lay out noted in flow sheets. Pt's goal is to improve balance and strength.    Currently in Pain? No/denies    Multiple Pain Sites No               TREATMENT: BP at start of session: 140/70, HR 63 in LUE seated;    Ex: Warm up on Nustep BUE/BLE level  2-4, interval training x5 min with cues to keep steps per minute >75 x5 min; Pt often keeps speed at 60 spm; She exhibits a slower speed with  increased resistance;  completed 0.15 miles Vitals after exercise,:BP:  175/74, taken in LUE seated, HR 60  Gait in gym: Forward walking with high knee march x10 meter Backward walking x10 meter lap  Required CGA for safety, patient more short of breath, requiring short seated rest break;  Ex: In parallel bars: Forward walk reciprocal stepping over 1/2 bolster, orange hurdle, 1/2 bolster x10 reps with intermittent rail assist Side stepping over 1/2 bolster, orange hurdle, 1/2 bolster x2 laps each direction with BUE fingertip hold Pt exhibits decreased foot clearance over orange hurdle requiring increased time/attempts for foot clearance. She required cues to increase step length for better gait safety;   NMR: Standing in parallel bars: Side stepping on airex beam, unsupported x3 laps each direction Tandem stance with 2-0 rail assist unsupported 30 sec hold x2 reps each foot in front  Tolerated session well. Patient fatigued, but was able to progress exercise. She did have increased challenge stepping over high obstacles.                                  PT Education - 12/20/21 1308     Education Details exercise technique/positioning;    Person(s) Educated Patient    Methods Explanation;Verbal cues    Comprehension Verbalized understanding;Returned demonstration;Verbal cues required;Need further instruction              PT Short Term Goals - 10/21/21 1359       PT SHORT TERM GOAL #1   Title Pt will be independent with HEP to improve posture and balance for ADL completion    Baseline 08/11/21: initiated; Has been doing her HEP, no questions (09/15/2021) 4/13: doing well    Time 10    Period Weeks    Status Achieved    Target Date 10/20/21               PT Long Term Goals - 12/21/21 0913       PT LONG TERM GOAL #1   Title Pt will improve FOTO to target score of 54 to demonstrate clinically significant improvement in functional mobility     Baseline 08/11/21: 43; 51 (09/15/2021), 5/25: 45%    Time 8    Period Weeks    Status Partially Met    Target Date 02/15/22      PT LONG TERM GOAL #2   Title Pt will improve 5xSTS to <15 sec without UE support to demonstrate clinically significant improvement in strength and reduce fall risk.    Baseline 08/11/21: 23.08 sec with UE's on arm rests; 23 seconds with UE's on arm rests (09/15/2021), 4/13:  17.07 sec without UE assist; 5/25: 12.5 sec    Time 8    Period Weeks    Status Achieved    Target Date 02/15/22      PT LONG TERM GOAL #3   Title Pt will improve DGI to 19 to demonstrate clinicially significant improvement in falls reduction with community ambulation tasks.    Baseline 08/11/21: 11/24, 4/13: deferred due to time, 5/25: deferred;    Time 8    Period Weeks    Status Deferred    Target Date 02/15/22      PT LONG TERM GOAL #4   Title Pt will improve normal walking speed to > 0.8 m/s to decrease risk of falls with household walking tasks.    Baseline 08/11/21: 0.58 m/s; 0.71 m/s with rollator walker (09/15/2021), 4/13: 0.69 m/s (regular pace); 5/25: 0.84 m/s without rollator    Time 8    Period Weeks    Status Achieved    Target Date 02/15/22      PT LONG TERM GOAL #5   Title Pt will improve Berg balance by 8 points to display clinically significant improvement in reduced fall risk with ADL's    Baseline 08/18/21: 40/56; 43/56 (09/15/2021), 4/13: 49/56, 5/25: 53/56    Time 8    Period Weeks    Status Achieved    Target Date 02/15/22                   Plan - 12/21/21 0906     Clinical Impression Statement Patient motivated and participated well within session. Vitals monitored as patient continues to experience brief elevation in BP with cardiovascular conditioning exercise due to deconditioning. She did require min VCs for correct exercise technique. Patient had increased difficulty clearing foot from orange hurdle likely from increased height of obstacle as well as  decreased vision. Patient often looks down to see where she is walking which leads to forward flexed posture. Patient does require cues for erect posture when standing unsupported to reorient to midline. She would benefit from additional skilled PT intervention to improve strength, balance and mobility; Goals were recently updated. Plan to continue to address cardiovascular conditioning, strength and balance/mobility;    Personal Factors and Comorbidities Age;Time since onset of injury/illness/exacerbation;Comorbidity 3+;Past/Current Experience    Comorbidities PMH inc;ludes: Anxiety, skin cancer, GERD, HTN, osteoporosis, L2-L4 kyphoplasties 08/29/19, R hip ORIF.    Examination-Activity Limitations Stairs;Bend;Locomotion Level;Stand;Reach Overhead;Carry;Transfers;Squat    Examination-Participation Restrictions Shop;Community Activity    Stability/Clinical Decision Making Stable/Uncomplicated    Rehab Potential Fair    PT Frequency 2x / week    PT Duration 8 weeks    PT Treatment/Interventions ADLs/Self Care Home Management;DME Instruction;Gait training;Stair training;Functional mobility training;Therapeutic activities;Therapeutic exercise;Balance training;Neuromuscular re-education;Patient/family education    PT Home Exercise Plan Access Code [email or web address] with scap retractions for posture; no updates    Consulted and Agree with Plan of Care Patient             Patient will benefit from skilled therapeutic intervention in order to improve the following deficits and impairments:  Abnormal gait, Improper body mechanics, Decreased mobility, Postural dysfunction, Decreased activity tolerance, Decreased balance, Decreased strength, Difficulty walking  Visit Diagnosis: Difficulty in walking, not elsewhere classified  Unsteadiness on feet  Muscle weakness (generalized)  Abnormality of gait and mobility  Abnormal posture     Problem List Patient Active Problem List   Diagnosis Date Noted    Femur fracture, right, closed, initial encounter 11/25/2015  Angella Montas, PT, DPT 12/21/2021, 9:14 AM  [place] MAIN [facility] SERVICES [address] [place], [place], [address] Phone: [phone]   Fax:  [phone]  Name: CAMELIA STELZNER MRN: [id number] Date of Birth: 21-Aug-1940

## 2021-12-22 ENCOUNTER — Encounter: Payer: Self-pay | Admitting: Physical Therapy

## 2021-12-22 ENCOUNTER — Ambulatory Visit: Payer: Medicare HMO | Admitting: Physical Therapy

## 2021-12-22 DIAGNOSIS — R262 Difficulty in walking, not elsewhere classified: Secondary | ICD-10-CM | POA: Diagnosis not present

## 2021-12-22 DIAGNOSIS — R269 Unspecified abnormalities of gait and mobility: Secondary | ICD-10-CM | POA: Diagnosis not present

## 2021-12-22 DIAGNOSIS — M6281 Muscle weakness (generalized): Secondary | ICD-10-CM | POA: Diagnosis not present

## 2021-12-22 DIAGNOSIS — R2681 Unsteadiness on feet: Secondary | ICD-10-CM | POA: Diagnosis not present

## 2021-12-22 DIAGNOSIS — R293 Abnormal posture: Secondary | ICD-10-CM | POA: Diagnosis not present

## 2021-12-22 NOTE — Therapy (Signed)
[place] MAIN [facility] SERVICES [address] [place], [place], [address] Phone: [phone]   Fax:  [phone]  Physical Therapy Treatment  Patient Details  Name: April Rogers MRN: [id number] Date of Birth: 11/28/1940 Referring Provider (PT): Duanne Guess, [place]   Encounter Date: 12/22/2021   PT End of Session - 12/22/21 1307     Visit Number 35    Number of Visits 64    Date for PT Re-Evaluation 02/15/22    Authorization Type [employer]    Authorization Time Period 10/21/21-01/07/22 for 20 visits    Authorization - Visit Number 39    Authorization - Number of Visits 20    Progress Note Due on Visit 54    PT Start Time 1302    PT Stop Time 1345    PT Time Calculation (min) 43 min    Equipment Utilized During Treatment Gait belt    Activity Tolerance Patient tolerated treatment well;No increased pain;Patient limited by fatigue    Behavior During Therapy [facility] for tasks assessed/performed             Past Medical History:  Diagnosis Date   Anxiety    Arthritis    Cancer ([place])    skin cancer   Complication of anesthesia    general anesthesia hard on memory after pt goes home   Depression    Fracture of one rib    GERD (gastroesophageal reflux disease)    was on nexium but no longer needs to take this medication   Hypertension    Macular degeneration of both eyes    Osteoporosis     Past Surgical History:  Procedure Laterality Date   ARM SKIN LESION BIOPSY / EXCISION Right    CHOLECYSTECTOMY     COLONOSCOPY WITH PROPOFOL N/A 08/03/2015   Procedure: COLONOSCOPY WITH PROPOFOL;  Surgeon: Hulen Luster, MD;  Location: [facility] ENDOSCOPY;  Service: Gastroenterology;  Laterality: N/A;   INTRAMEDULLARY (IM) NAIL INTERTROCHANTERIC Right 11/26/2015   Procedure: INTRAMEDULLARY (IM) NAIL INTERTROCHANTRIC;  Surgeon: Earnestine Leys, MD;  Location: [facility] ORS;  Service: Orthopedics;  Laterality: Right;   KYPHOPLASTY N/A 08/16/2019   Procedure: L4  KYPHOPLASTY;  Surgeon: Hessie Knows, MD;  Location: [facility] ORS;  Service: Orthopedics;  Laterality: N/A;   KYPHOPLASTY N/A 08/29/2019   Procedure: L2 KYPHOPLASTY;  Surgeon: Hessie Knows, MD;  Location: [facility] ORS;  Service: Orthopedics;  Laterality: N/A;   KYPHOPLASTY N/A 10/10/2019   Procedure: L3 KYPHOPLASTY;  Surgeon: Hessie Knows, MD;  Location: [facility] ORS;  Service: Orthopedics;  Laterality: N/A;   open heart surgery  2019   valve replacement, aortic arch  aneurysm repair   TONSILLECTOMY     TUBAL LIGATION     WRIST RECONSTRUCTION Right     There were no vitals filed for this visit.   Subjective Assessment - 12/22/21 1307     Subjective Patient reports doing well. She reports not feeling as tired. No pain today.    Pertinent History Pt is a 81 y.o. female referred to PT for gait and imbalance. PMH includes: Anxiety, skin cancer, GERD, HTN, osteoporosis, L2-L4 kyphoplasties 08/29/19, R hip ORIF. Pt reporting for OPPT for gait/imbalance. Was recently receiving OP PT for R shoulder but that has been complete. Pt 's daughter present to assist in history taking. Pt's daughter reports since covid, open heart surgery, recent surgeries causing balance concerns dating from 2019. No falls in last 6 months, 1 fall in the last year. Near  falls reported. Pt is a furniture walker, does use rollator in house. Pt endorsing difficulty with vision, obstacle navigation, clearing feet with walking. Home lay out noted in flow sheets. Pt's goal is to improve balance and strength.    Currently in Pain? No/denies    Multiple Pain Sites No                    TREATMENT:  Ex: Warm up on Nustep BUE/BLE level  2-4, interval training x4 min with cues to keep steps per minute >75 x4 min; Pt often keeps speed at 60 spm; She exhibits a slower speed with increased resistance;  completed 0.15 miles Vitals after exercise,:  seated, HR 99, SpO2 97%     Ex: In parallel bars: Side stepping with red tband x10 feet  x2 laps each direction with intermittent rail assist;  Stepping over orange hurdle, single leg, 3# ankle weight x10 reps each LE with intermittent rail assist  March in place, high knee march, 3# x1 min with BUE rail assist to challenge cardiovascular endurance;     NMR: Outside parallel bars: Red mat on floor: -forward/backward x5 laps unsupported -side stepping x2 laps each direction unsupported Patient required CGA and cues for positioning; She was able to exhibit better erect posture compared to previous sessions;   Standing in staggered stance, forward/backward rock and reach with contralateral UE lift x10 reps each foot in front, on red mat Required CGA  Initially required min VCs for sequencing but then able to complete with good coordination and control;     Tolerated session well. Patient fatigued, but was able to progress exercise. She did have increased challenge stepping over high obstacles.                           PT Education - 12/22/21 1307     Education Details exercise technique/positioning;    Person(s) Educated Patient    Methods Explanation;Verbal cues    Comprehension Verbalized understanding;Returned demonstration;Verbal cues required;Need further instruction              PT Short Term Goals - 10/21/21 1359       PT SHORT TERM GOAL #1   Title Pt will be independent with HEP to improve posture and balance for ADL completion    Baseline 08/11/21: initiated; Has been doing her HEP, no questions (09/15/2021) 4/13: doing well    Time 10    Period Weeks    Status Achieved    Target Date 10/20/21               PT Long Term Goals - 12/21/21 0913       PT LONG TERM GOAL #1   Title Pt will improve FOTO to target score of 54 to demonstrate clinically significant improvement in functional mobility    Baseline 08/11/21: 43; 51 (09/15/2021), 5/25: 45%    Time 8    Period Weeks    Status Partially Met    Target Date 02/15/22      PT  LONG TERM GOAL #2   Title Pt will improve 5xSTS to <15 sec without UE support to demonstrate clinically significant improvement in strength and reduce fall risk.    Baseline 08/11/21: 23.08 sec with UE's on arm rests; 23 seconds with UE's on arm rests (09/15/2021), 4/13: 17.07 sec without UE assist; 5/25: 12.5 sec    Time 8    Period Weeks  Status Achieved    Target Date 02/15/22      PT LONG TERM GOAL #3   Title Pt will improve DGI to 19 to demonstrate clinicially significant improvement in falls reduction with community ambulation tasks.    Baseline 08/11/21: 11/24, 4/13: deferred due to time, 5/25: deferred;    Time 8    Period Weeks    Status Deferred    Target Date 02/15/22      PT LONG TERM GOAL #4   Title Pt will improve normal walking speed to > 0.8 m/s to decrease risk of falls with household walking tasks.    Baseline 08/11/21: 0.58 m/s; 0.71 m/s with rollator walker (09/15/2021), 4/13: 0.69 m/s (regular pace); 5/25: 0.84 m/s without rollator    Time 8    Period Weeks    Status Achieved    Target Date 02/15/22      PT LONG TERM GOAL #5   Title Pt will improve Berg balance by 8 points to display clinically significant improvement in reduced fall risk with ADL's    Baseline 08/18/21: 40/56; 43/56 (09/15/2021), 4/13: 49/56, 5/25: 53/56    Time 8    Period Weeks    Status Achieved    Target Date 02/15/22                   Plan - 12/23/21 5520     Clinical Impression Statement Patient motivated and participated well within session. She was instructed in advanced cardiovascular conditioning/strengthening exercise. Patient does require min VCs for correct exercise technique. She does fatigue quickly requiring short seated rest breaks. She was also instructed in advanced balance exercise. Utilized red mat to challenge gait with unstable surface. She require CGA to min A for safety. Patient did well UE/LE coordination tasks requiring few cues for initial sequencing. Patient would  benefit from additional skilled PT intervention to improve strength, balance and mobility;    Personal Factors and Comorbidities Age;Time since onset of injury/illness/exacerbation;Comorbidity 3+;Past/Current Experience    Comorbidities PMH inc;ludes: Anxiety, skin cancer, GERD, HTN, osteoporosis, L2-L4 kyphoplasties 08/29/19, R hip ORIF.    Examination-Activity Limitations Stairs;Bend;Locomotion Level;Stand;Reach Overhead;Carry;Transfers;Squat    Examination-Participation Restrictions Shop;Community Activity    Stability/Clinical Decision Making Stable/Uncomplicated    Rehab Potential Fair    PT Frequency 2x / week    PT Duration 8 weeks    PT Treatment/Interventions ADLs/Self Care Home Management;DME Instruction;Gait training;Stair training;Functional mobility training;Therapeutic activities;Therapeutic exercise;Balance training;Neuromuscular re-education;Patient/family education    PT Home Exercise Plan Access Code [email or web address] with scap retractions for posture; no updates    Consulted and Agree with Plan of Care Patient             Patient will benefit from skilled therapeutic intervention in order to improve the following deficits and impairments:  Abnormal gait, Improper body mechanics, Decreased mobility, Postural dysfunction, Decreased activity tolerance, Decreased balance, Decreased strength, Difficulty walking  Visit Diagnosis: Difficulty in walking, not elsewhere classified  Unsteadiness on feet  Muscle weakness (generalized)  Abnormality of gait and mobility  Abnormal posture     Problem List Patient Active Problem List   Diagnosis Date Noted   Femur fracture, right, closed, initial encounter 11/25/2015    Linkin Vizzini, PT, DPT 12/23/2021, 8:35 AM  [place], [place], [address] Phone: [phone]   Fax:  [phone]  Name: April Rogers MRN: [id number] Date of Birth:  1940-12-20

## 2021-12-24 ENCOUNTER — Ambulatory Visit: Payer: Medicare HMO

## 2021-12-24 DIAGNOSIS — H26492 Other secondary cataract, left eye: Secondary | ICD-10-CM | POA: Diagnosis not present

## 2021-12-24 DIAGNOSIS — H353211 Exudative age-related macular degeneration, right eye, with active choroidal neovascularization: Secondary | ICD-10-CM | POA: Diagnosis not present

## 2021-12-24 DIAGNOSIS — Z0100 Encounter for examination of eyes and vision without abnormal findings: Secondary | ICD-10-CM | POA: Diagnosis not present

## 2021-12-27 ENCOUNTER — Encounter: Payer: Self-pay | Admitting: Physical Therapy

## 2021-12-27 ENCOUNTER — Ambulatory Visit: Payer: Medicare HMO | Admitting: Physical Therapy

## 2021-12-27 DIAGNOSIS — R269 Unspecified abnormalities of gait and mobility: Secondary | ICD-10-CM

## 2021-12-27 DIAGNOSIS — R293 Abnormal posture: Secondary | ICD-10-CM

## 2021-12-27 DIAGNOSIS — R262 Difficulty in walking, not elsewhere classified: Secondary | ICD-10-CM

## 2021-12-27 DIAGNOSIS — M6281 Muscle weakness (generalized): Secondary | ICD-10-CM

## 2021-12-27 DIAGNOSIS — R2681 Unsteadiness on feet: Secondary | ICD-10-CM

## 2021-12-27 NOTE — Therapy (Unsigned)
[place] MAIN [facility] SERVICES [address] [place], [place], [address] Phone: [phone]   Fax:  [phone]  Physical Therapy Treatment  Patient Details  Name: April Rogers MRN: [id number] Date of Birth: Nov 09, 1940 Referring Provider (PT): Duanne Guess, [place]   Encounter Date: 12/27/2021   PT End of Session - 12/27/21 1301     Visit Number 36    Number of Visits 4    Date for PT Re-Evaluation 02/15/22    Authorization Type [employer]    Authorization Time Period 10/21/21-01/07/22 for 20 visits    Authorization - Visit Number 15    Authorization - Number of Visits 20    Progress Note Due on Visit 40    PT Start Time 1302    PT Stop Time 1345    PT Time Calculation (min) 43 min    Equipment Utilized During Treatment Gait belt    Activity Tolerance Patient tolerated treatment well;No increased pain;Patient limited by fatigue    Behavior During Therapy [facility] for tasks assessed/performed             Past Medical History:  Diagnosis Date   Anxiety    Arthritis    Cancer ([place])    skin cancer   Complication of anesthesia    general anesthesia hard on memory after pt goes home   Depression    Fracture of one rib    GERD (gastroesophageal reflux disease)    was on nexium but no longer needs to take this medication   Hypertension    Macular degeneration of both eyes    Osteoporosis     Past Surgical History:  Procedure Laterality Date   ARM SKIN LESION BIOPSY / EXCISION Right    CHOLECYSTECTOMY     COLONOSCOPY WITH PROPOFOL N/A 08/03/2015   Procedure: COLONOSCOPY WITH PROPOFOL;  Surgeon: Hulen Luster, MD;  Location: [facility] ENDOSCOPY;  Service: Gastroenterology;  Laterality: N/A;   INTRAMEDULLARY (IM) NAIL INTERTROCHANTERIC Right 11/26/2015   Procedure: INTRAMEDULLARY (IM) NAIL INTERTROCHANTRIC;  Surgeon: Earnestine Leys, MD;  Location: [facility] ORS;  Service: Orthopedics;  Laterality: Right;   KYPHOPLASTY N/A 08/16/2019   Procedure: L4  KYPHOPLASTY;  Surgeon: Hessie Knows, MD;  Location: [facility] ORS;  Service: Orthopedics;  Laterality: N/A;   KYPHOPLASTY N/A 08/29/2019   Procedure: L2 KYPHOPLASTY;  Surgeon: Hessie Knows, MD;  Location: [facility] ORS;  Service: Orthopedics;  Laterality: N/A;   KYPHOPLASTY N/A 10/10/2019   Procedure: L3 KYPHOPLASTY;  Surgeon: Hessie Knows, MD;  Location: [facility] ORS;  Service: Orthopedics;  Laterality: N/A;   open heart surgery  2019   valve replacement, aortic arch  aneurysm repair   TONSILLECTOMY     TUBAL LIGATION     WRIST RECONSTRUCTION Right     There were no vitals filed for this visit.          TREATMENT:     Ex: Standing next to support bar:    March in place, high knee march, 3#, 2x1 min with BUE rail assist to challenge cardiovascular endurance;   Hip abduction 3# x15 reps each LE with cues to avoid hip ER to isolate abductor strengthening Hip extension 3# x15 reps each LE with cues for core stabilization for less back discomfort;  BP after standing exercise: 162/69, HR 54  Standing on firm surface:   BUE shoulder extension red tband x10 reps BUE shoulder low row red tband x10 reps;  Standing alternate UE lift 1# x5 reps each  UE    NMR: Forward walk, big step x10 meter Backward walking x10 meter     Tolerated session well. Patient fatigued, but was able to progress exercise. She did have increased challenge stepping over high obstacles.                                  PT Education - 12/27/21 1301     Education Details exercise technique/positioning;    Person(s) Educated Patient    Methods Explanation;Verbal cues    Comprehension Verbalized understanding;Returned demonstration;Verbal cues required;Need further instruction              PT Short Term Goals - 10/21/21 1359       PT SHORT TERM GOAL #1   Title Pt will be independent with HEP to improve posture and balance for ADL completion    Baseline 08/11/21: initiated; Has been  doing her HEP, no questions (09/15/2021) 4/13: doing well    Time 10    Period Weeks    Status Achieved    Target Date 10/20/21               PT Long Term Goals - 12/21/21 0913       PT LONG TERM GOAL #1   Title Pt will improve FOTO to target score of 54 to demonstrate clinically significant improvement in functional mobility    Baseline 08/11/21: 43; 51 (09/15/2021), 5/25: 45%    Time 8    Period Weeks    Status Partially Met    Target Date 02/15/22      PT LONG TERM GOAL #2   Title Pt will improve 5xSTS to <15 sec without UE support to demonstrate clinically significant improvement in strength and reduce fall risk.    Baseline 08/11/21: 23.08 sec with UE's on arm rests; 23 seconds with UE's on arm rests (09/15/2021), 4/13: 17.07 sec without UE assist; 5/25: 12.5 sec    Time 8    Period Weeks    Status Achieved    Target Date 02/15/22      PT LONG TERM GOAL #3   Title Pt will improve DGI to 19 to demonstrate clinicially significant improvement in falls reduction with community ambulation tasks.    Baseline 08/11/21: 11/24, 4/13: deferred due to time, 5/25: deferred;    Time 8    Period Weeks    Status Deferred    Target Date 02/15/22      PT LONG TERM GOAL #4   Title Pt will improve normal walking speed to > 0.8 m/s to decrease risk of falls with household walking tasks.    Baseline 08/11/21: 0.58 m/s; 0.71 m/s with rollator walker (09/15/2021), 4/13: 0.69 m/s (regular pace); 5/25: 0.84 m/s without rollator    Time 8    Period Weeks    Status Achieved    Target Date 02/15/22      PT LONG TERM GOAL #5   Title Pt will improve Berg balance by 8 points to display clinically significant improvement in reduced fall risk with ADL's    Baseline 08/18/21: 40/56; 43/56 (09/15/2021), 4/13: 49/56, 5/25: 53/56    Time 8    Period Weeks    Status Achieved    Target Date 02/15/22                    Patient will benefit from skilled therapeutic intervention in order to improve the  following deficits and impairments:     Visit Diagnosis: Difficulty in walking, not elsewhere classified  Unsteadiness on feet  Muscle weakness (generalized)  Abnormality of gait and mobility  Abnormal posture     Problem List Patient Active Problem List   Diagnosis Date Noted   Femur fracture, right, closed, initial encounter 11/25/2015    Chistina Roston, PT 12/27/2021, 1:02 PM  [place], [place], [address] Phone: [phone]   Fax:  [phone]  Name: April Rogers MRN: [id number] Date of Birth: Oct 29, 1940

## 2021-12-29 ENCOUNTER — Ambulatory Visit: Payer: Medicare HMO | Admitting: Physical Therapy

## 2021-12-29 ENCOUNTER — Encounter: Payer: Self-pay | Admitting: Physical Therapy

## 2021-12-29 DIAGNOSIS — R262 Difficulty in walking, not elsewhere classified: Secondary | ICD-10-CM

## 2021-12-29 DIAGNOSIS — R269 Unspecified abnormalities of gait and mobility: Secondary | ICD-10-CM | POA: Diagnosis not present

## 2021-12-29 DIAGNOSIS — R2681 Unsteadiness on feet: Secondary | ICD-10-CM | POA: Diagnosis not present

## 2021-12-29 DIAGNOSIS — R293 Abnormal posture: Secondary | ICD-10-CM

## 2021-12-29 DIAGNOSIS — M6281 Muscle weakness (generalized): Secondary | ICD-10-CM | POA: Diagnosis not present

## 2021-12-29 NOTE — Therapy (Signed)
OUTPATIENT PHYSICAL THERAPY TREATMENT NOTE   Patient Name: April Rogers MRN: [id number] DOB:1940/12/01, 81 y.o., female Today's Date: 12/30/2021  PCP: Emerson Monte, MD REFERRING PROVIDER: Duanne Guess, PA    PT End of Session - 12/29/21 1304     Visit Number 37    Number of Visits 80    Date for PT Re-Evaluation 02/15/22    Authorization Type [employer]    Authorization Time Period 10/21/21-01/07/22 for 20 visits    Authorization - Visit Number 15    Authorization - Number of Visits 20    Progress Note Due on Visit 40    PT Start Time 1301    PT Stop Time 1345    PT Time Calculation (min) 44 min    Equipment Utilized During Treatment Gait belt    Activity Tolerance Patient tolerated treatment well;No increased pain;Patient limited by fatigue    Behavior During Therapy [facility] for tasks assessed/performed             Past Medical History:  Diagnosis Date   Anxiety    Arthritis    Cancer ([place])    skin cancer   Complication of anesthesia    general anesthesia hard on memory after pt goes home   Depression    Fracture of one rib    GERD (gastroesophageal reflux disease)    was on nexium but no longer needs to take this medication   Hypertension    Macular degeneration of both eyes    Osteoporosis    Past Surgical History:  Procedure Laterality Date   ARM SKIN LESION BIOPSY / EXCISION Right    CHOLECYSTECTOMY     COLONOSCOPY WITH PROPOFOL N/A 08/03/2015   Procedure: COLONOSCOPY WITH PROPOFOL;  Surgeon: Hulen Luster, MD;  Location: [facility] ENDOSCOPY;  Service: Gastroenterology;  Laterality: N/A;   INTRAMEDULLARY (IM) NAIL INTERTROCHANTERIC Right 11/26/2015   Procedure: INTRAMEDULLARY (IM) NAIL INTERTROCHANTRIC;  Surgeon: Earnestine Leys, MD;  Location: [facility] ORS;  Service: Orthopedics;  Laterality: Right;   KYPHOPLASTY N/A 08/16/2019   Procedure: L4 KYPHOPLASTY;  Surgeon: Hessie Knows, MD;  Location: [facility] ORS;  Service: Orthopedics;  Laterality: N/A;   KYPHOPLASTY N/A  08/29/2019   Procedure: L2 KYPHOPLASTY;  Surgeon: Hessie Knows, MD;  Location: [facility] ORS;  Service: Orthopedics;  Laterality: N/A;   KYPHOPLASTY N/A 10/10/2019   Procedure: L3 KYPHOPLASTY;  Surgeon: Hessie Knows, MD;  Location: [facility] ORS;  Service: Orthopedics;  Laterality: N/A;   open heart surgery  2019   valve replacement, aortic arch  aneurysm repair   TONSILLECTOMY     TUBAL LIGATION     WRIST RECONSTRUCTION Right    Patient Active Problem List   Diagnosis Date Noted   Femur fracture, right, closed, initial encounter 11/25/2015    REFERRING DIAG: Imbalance  THERAPY DIAG:  Difficulty in walking, not elsewhere classified  Unsteadiness on feet  Muscle weakness (generalized)  Abnormality of gait and mobility  Abnormal posture  Rationale for Evaluation and Treatment Rehabilitation  PERTINENT HISTORY: Pt is a 81 y.o. female referred to PT for gait and imbalance. PMH includes: Anxiety, skin cancer, GERD, HTN, osteoporosis, L2-L4 kyphoplasties 08/29/19, R hip ORIF. Pt reporting for OPPT for gait/imbalance. Was recently receiving OP PT for R shoulder but that has been complete. Pt 's daughter present to assist in history taking. Pt's daughter reports since covid, open heart surgery, recent surgeries causing balance concerns dating from 2019. No falls in last 6 months, 1 fall in the last  year. Near falls reported. Pt is a furniture walker, does use rollator in house. Pt endorsing difficulty with vision, obstacle navigation, clearing feet with walking. Home lay out noted in flow sheets. Pt's goal is to improve balance and strength.   PRECAUTIONS: fall risk  SUBJECTIVE: Pt reports doing well. She is still keeping an eye on her BP. It was 138/67 this morning. Patient denies any new falls. Denies any pain;   PAIN:  Are you having pain? No     TODAY'S TREATMENT:  12/29/21  Ex: Warm up on Nustep BUE/BLE level  2-4, interval training x4 min with cues to keep steps per minute >75 x4 min;  Pt often keeps speed at 65 spm; She exhibits a slower speed with increased resistance;  completed 0.15 miles Vitals after exercise,:  seated, HR 78, SpO2 97%    Ex: In parallel bars: Side stepping with red tband x10 feet x2 laps each direction with BUE rail assist;   Stepping over orange hurdle, bilateral leg, 3# ankle weight x15 reps with 1 rail assist Side stepping over orange hurdle, 3# x10 reps each direction with 1 rail assist;    Forward high knee march, 3# x2 laps with BUE rail assist to challenge cardiovascular endurance;  Backward walking unsupported 3# x2 laps in parallel bars;    NMR: Tandem gait on airex beam x2 laps with intermittent rail assist Tandem stance on airex beam:  Unsupported 20 sec hold x2 reps each foot in front   Progressed from min A to CGA/close supervision with better trunk control and positioning during 2nd set;      Tolerated session well. Patient fatigued, but was able to progress exercise. She did have increased challenge with reducing rail assist;              PATIENT EDUCATION: Education details: exercise technique/positioning Person educated: Patient Education method: Explanation, Demonstration, and Verbal cues Education comprehension: verbalized understanding, returned demonstration, verbal cues required, and needs further education   HOME EXERCISE PROGRAM: Continue as previously given;    PT Short Term Goals - 10/21/21 1359       PT SHORT TERM GOAL #1   Title Pt will be independent with HEP to improve posture and balance for ADL completion    Baseline 08/11/21: initiated; Has been doing her HEP, no questions (09/15/2021) 4/13: doing well    Time 10    Period Weeks    Status Achieved    Target Date 10/20/21              PT Long Term Goals - 12/21/21 0913       PT LONG TERM GOAL #1   Title Pt will improve FOTO to target score of 54 to demonstrate clinically significant improvement in functional mobility    Baseline  08/11/21: 43; 51 (09/15/2021), 5/25: 45%    Time 8    Period Weeks    Status Partially Met    Target Date 02/15/22      PT LONG TERM GOAL #2   Title Pt will improve 5xSTS to <15 sec without UE support to demonstrate clinically significant improvement in strength and reduce fall risk.    Baseline 08/11/21: 23.08 sec with UE's on arm rests; 23 seconds with UE's on arm rests (09/15/2021), 4/13: 17.07 sec without UE assist; 5/25: 12.5 sec    Time 8    Period Weeks    Status Achieved    Target Date 02/15/22      PT LONG  TERM GOAL #3   Title Pt will improve DGI to 19 to demonstrate clinicially significant improvement in falls reduction with community ambulation tasks.    Baseline 08/11/21: 11/24, 4/13: deferred due to time, 5/25: deferred;    Time 8    Period Weeks    Status Deferred    Target Date 02/15/22      PT LONG TERM GOAL #4   Title Pt will improve normal walking speed to > 0.8 m/s to decrease risk of falls with household walking tasks.    Baseline 08/11/21: 0.58 m/s; 0.71 m/s with rollator walker (09/15/2021), 4/13: 0.69 m/s (regular pace); 5/25: 0.84 m/s without rollator    Time 8    Period Weeks    Status Achieved    Target Date 02/15/22      PT LONG TERM GOAL #5   Title Pt will improve Berg balance by 8 points to display clinically significant improvement in reduced fall risk with ADL's    Baseline 08/18/21: 40/56; 43/56 (09/15/2021), 4/13: 49/56, 5/25: 53/56    Time 8    Period Weeks    Status Achieved    Target Date 02/15/22              Plan - 12/29/21 1307     Clinical Impression Statement Patient motivated and participated well within session. She was instructed in advanced LE strengthening/cardiovascular conditioning. Patient did require cues for correct exercise technique/positioning. She does require intermittent rail assist for support. She also required cues to increase core stabilization during LE exercise for less strain on low back; Patient has limited vision which  contributes to her flexed posture and difficulty clearing obstacles. Utilized airex beam to challenge stance control. She was able to exhibit better tandem stance with increased repetition with better trunk control/weight shift. Patient would benefit from additional skilled PT Intervention to improve strength, balance and mobility;     Personal Factors and Comorbidities Age;Time since onset of injury/illness/exacerbation;Comorbidity 3+;Past/Current Experience    Comorbidities PMH inc;ludes: Anxiety, skin cancer, GERD, HTN, osteoporosis, L2-L4 kyphoplasties 08/29/19, R hip ORIF.    Examination-Activity Limitations Stairs;Bend;Locomotion Level;Stand;Reach Overhead;Carry;Transfers;Squat    Examination-Participation Restrictions Shop;Community Activity    Stability/Clinical Decision Making Stable/Uncomplicated    Rehab Potential Fair    PT Frequency 2x / week    PT Duration 8 weeks    PT Treatment/Interventions ADLs/Self Care Home Management;DME Instruction;Gait training;Stair training;Functional mobility training;Therapeutic activities;Therapeutic exercise;Balance training;Neuromuscular re-education;Patient/family education    PT Home Exercise Plan Access Code [email or web address] with scap retractions for posture; no updates    Consulted and Agree with Plan of Care Patient               Shaleena Crusoe, PT, DPT 12/30/2021, 9:02 AM

## 2022-01-03 ENCOUNTER — Ambulatory Visit: Payer: Medicare HMO | Admitting: Physical Therapy

## 2022-01-03 ENCOUNTER — Encounter: Payer: Self-pay | Admitting: Physical Therapy

## 2022-01-03 DIAGNOSIS — R2681 Unsteadiness on feet: Secondary | ICD-10-CM

## 2022-01-03 DIAGNOSIS — M6281 Muscle weakness (generalized): Secondary | ICD-10-CM | POA: Diagnosis not present

## 2022-01-03 DIAGNOSIS — R262 Difficulty in walking, not elsewhere classified: Secondary | ICD-10-CM

## 2022-01-03 DIAGNOSIS — R293 Abnormal posture: Secondary | ICD-10-CM | POA: Diagnosis not present

## 2022-01-03 DIAGNOSIS — R269 Unspecified abnormalities of gait and mobility: Secondary | ICD-10-CM | POA: Diagnosis not present

## 2022-01-03 NOTE — Therapy (Signed)
OUTPATIENT PHYSICAL THERAPY TREATMENT NOTE   Patient Name: April Rogers MRN: [id number] DOB:[birth year], 81 y.o., female Today's Date: 01/03/2022  PCP: Asa Lente, MD REFERRING PROVIDER: Evon Slack, PA    PT End of Session - 01/03/22 1258     Visit Number 38    Number of Visits 53    Date for PT Re-Evaluation 02/15/22    Authorization Type [employer]    Authorization Time Period 10/21/21-01/07/22 for 20 visits    Authorization - Visit Number 15    Authorization - Number of Visits 20    Progress Note Due on Visit 40    PT Start Time 1302    PT Stop Time 1343    PT Time Calculation (min) 41 min    Equipment Utilized During Treatment Gait belt    Activity Tolerance Patient tolerated treatment well;No increased pain;Patient limited by fatigue    Behavior During Therapy [facility] for tasks assessed/performed             Past Medical History:  Diagnosis Date   Anxiety    Arthritis    Cancer (HCC)    skin cancer   Complication of anesthesia    general anesthesia hard on memory after pt goes home   Depression    Fracture of one rib    GERD (gastroesophageal reflux disease)    was on nexium but no longer needs to take this medication   Hypertension    Macular degeneration of both eyes    Osteoporosis    Past Surgical History:  Procedure Laterality Date   ARM SKIN LESION BIOPSY / EXCISION Right    CHOLECYSTECTOMY     COLONOSCOPY WITH PROPOFOL N/A 08/03/2015   Procedure: COLONOSCOPY WITH PROPOFOL;  Surgeon: Wallace Cullens, MD;  Location: [facility] ENDOSCOPY;  Service: Gastroenterology;  Laterality: N/A;   INTRAMEDULLARY (IM) NAIL INTERTROCHANTERIC Right 11/26/2015   Procedure: INTRAMEDULLARY (IM) NAIL INTERTROCHANTRIC;  Surgeon: Deeann Saint, MD;  Location: [facility] ORS;  Service: Orthopedics;  Laterality: Right;   KYPHOPLASTY N/A 08/16/2019   Procedure: L4 KYPHOPLASTY;  Surgeon: Kennedy Bucker, MD;  Location: [facility] ORS;  Service: Orthopedics;  Laterality: N/A;   KYPHOPLASTY N/A  08/29/2019   Procedure: L2 KYPHOPLASTY;  Surgeon: Kennedy Bucker, MD;  Location: [facility] ORS;  Service: Orthopedics;  Laterality: N/A;   KYPHOPLASTY N/A 10/10/2019   Procedure: L3 KYPHOPLASTY;  Surgeon: Kennedy Bucker, MD;  Location: [facility] ORS;  Service: Orthopedics;  Laterality: N/A;   open heart surgery  2019   valve replacement, aortic arch  aneurysm repair   TONSILLECTOMY     TUBAL LIGATION     WRIST RECONSTRUCTION Right    Patient Active Problem List   Diagnosis Date Noted   Femur fracture, right, closed, initial encounter 11/25/2015    REFERRING DIAG: Imbalance  THERAPY DIAG:  Difficulty in walking, not elsewhere classified  Unsteadiness on feet  Muscle weakness (generalized)  Abnormality of gait and mobility  Abnormal posture  Rationale for Evaluation and Treatment Rehabilitation  PERTINENT HISTORY: Pt is a 81 y.o. female referred to PT for gait and imbalance. PMH includes: Anxiety, skin cancer, GERD, HTN, osteoporosis, L2-L4 kyphoplasties 08/29/19, R hip ORIF. Pt reporting for OPPT for gait/imbalance. Was recently receiving OP PT for R shoulder but that has been complete. Pt 's daughter present to assist in history taking. Pt's daughter reports since covid, open heart surgery, recent surgeries causing balance concerns dating from 2019. No falls in last 6 months, 1 fall in the last  year. Near falls reported. Pt is a furniture walker, does use rollator in house. Pt endorsing difficulty with vision, obstacle navigation, clearing feet with walking. Home lay out noted in flow sheets. Pt's goal is to improve balance and strength.   PRECAUTIONS: fall risk  SUBJECTIVE: Pt reports doing well. She denies any pain; No new falls or stumbles. BP has been getting better   PAIN:  Are you having pain? No     TODAY'S TREATMENT:    Ex: Warm up on Nustep BUE/BLE level  2-4, interval training x4 min with cues to keep steps per minute >75 x4 min; Pt does a better job keeping faster pace this  session; She exhibits a slower speed with increased resistance;  completed 0.15 miles    Ex: In parallel bars: Side stepping with red tband x10 feet x2 laps each direction with BUE rail assist;   Side stepping over orange hurdle, 3# x5 reps each direction with 1 rail assist;   Standing on airex pad:  BUE shoulder extension red tband x10 reps BUE shoulder low row red tband x10 reps; Required mod VCS for proper positioning including to increase erect posture and increase scapular retraction;    Standing alternate UE lift 1# x10 reps each UE  Standing BUE shoulder abduction at 90 degrees 1# x10 reps; Denies any shoulder pain, does require close supervision for safety;   Forward walking in parallel bars, 3# x2 laps with BUE rail assist to challenge cardiovascular endurance;  Backward walking unsupported 3# x2 laps in parallel bars, required intermittent    Standing on 1/2 bolster (Round side up): Heel raises x15 reps Toe raises x15 reps; Pt able to exhibit good ROM with minimal difficulty. Does require cues to keep trunk extended and avoid hip flexion/extension to isolate ankle ROM;    Tolerated session well. Patient fatigued, but was able to progress exercise. She did have increased challenge with reducing rail assist;              PATIENT EDUCATION: Education details: exercise technique/positioning Person educated: Patient Education method: Explanation, Demonstration, and Verbal cues Education comprehension: verbalized understanding, returned demonstration, verbal cues required, and needs further education   HOME EXERCISE PROGRAM: Continue as previously given;    PT Short Term Goals - 10/21/21 1359       PT SHORT TERM GOAL #1   Title Pt will be independent with HEP to improve posture and balance for ADL completion    Baseline 08/11/21: initiated; Has been doing her HEP, no questions (09/15/2021) 4/13: doing well    Time 10    Period Weeks    Status Achieved    Target  Date 10/20/21              PT Long Term Goals - 12/21/21 0913       PT LONG TERM GOAL #1   Title Pt will improve FOTO to target score of 54 to demonstrate clinically significant improvement in functional mobility    Baseline 08/11/21: 43; 51 (09/15/2021), 5/25: 45%    Time 8    Period Weeks    Status Partially Met    Target Date 02/15/22      PT LONG TERM GOAL #2   Title Pt will improve 5xSTS to <15 sec without UE support to demonstrate clinically significant improvement in strength and reduce fall risk.    Baseline 08/11/21: 23.08 sec with UE's on arm rests; 23 seconds with UE's on arm rests (09/15/2021), 4/13: 17.07 sec  without UE assist; 5/25: 12.5 sec    Time 8    Period Weeks    Status Achieved    Target Date 02/15/22      PT LONG TERM GOAL #3   Title Pt will improve DGI to 19 to demonstrate clinicially significant improvement in falls reduction with community ambulation tasks.    Baseline 08/11/21: 11/24, 4/13: deferred due to time, 5/25: deferred;    Time 8    Period Weeks    Status Deferred    Target Date 02/15/22      PT LONG TERM GOAL #4   Title Pt will improve normal walking speed to > 0.8 m/s to decrease risk of falls with household walking tasks.    Baseline 08/11/21: 0.58 m/s; 0.71 m/s with rollator walker (09/15/2021), 4/13: 0.69 m/s (regular pace); 5/25: 0.84 m/s without rollator    Time 8    Period Weeks    Status Achieved    Target Date 02/15/22      PT LONG TERM GOAL #5   Title Pt will improve Berg balance by 8 points to display clinically significant improvement in reduced fall risk with ADL's    Baseline 08/18/21: 16/10; 43/56 (09/15/2021), 4/13: 49/56, 5/25: 53/56    Time 8    Period Weeks    Status Achieved    Target Date 02/15/22              Plan - 12/29/21 1307     Clinical Impression Statement Patient motivated and participated well within session. She was instructed in advanced LE strengthening/cardiovascular conditioning. Patient did require cues  for correct exercise technique/positioning. She does require intermittent rail assist for support. She also required cues to increase core stabilization during LE exercise for less strain on low back; Patient has limited vision which contributes to her flexed posture and difficulty clearing obstacles. Progressed postural strengthening with increased repetition. She was able to exhibit better erect posture with less thoracic kyphosis when static standing.  Patient would benefit from additional skilled PT Intervention to improve strength, balance and mobility;     Personal Factors and Comorbidities Age;Time since onset of injury/illness/exacerbation;Comorbidity 3+;Past/Current Experience    Comorbidities PMH inc;ludes: Anxiety, skin cancer, GERD, HTN, osteoporosis, L2-L4 kyphoplasties 08/29/19, R hip ORIF.    Examination-Activity Limitations Stairs;Bend;Locomotion Level;Stand;Reach Overhead;Carry;Transfers;Squat    Examination-Participation Restrictions Shop;Community Activity    Stability/Clinical Decision Making Stable/Uncomplicated    Rehab Potential Fair    PT Frequency 2x / week    PT Duration 8 weeks    PT Treatment/Interventions ADLs/Self Care Home Management;DME Instruction;Gait training;Stair training;Functional mobility training;Therapeutic activities;Therapeutic exercise;Balance training;Neuromuscular re-education;Patient/family education    PT Home Exercise Plan Access Code [email or web address] with scap retractions for posture; no updates    Consulted and Agree with Plan of Care Patient               Tywaun Hiltner, PT, DPT 01/03/2022, 2:23 PM

## 2022-01-04 ENCOUNTER — Encounter: Payer: Self-pay | Admitting: Physical Therapy

## 2022-01-05 ENCOUNTER — Encounter: Payer: Self-pay | Admitting: Physical Therapy

## 2022-01-05 ENCOUNTER — Ambulatory Visit: Payer: Medicare HMO | Admitting: Physical Therapy

## 2022-01-05 DIAGNOSIS — R269 Unspecified abnormalities of gait and mobility: Secondary | ICD-10-CM

## 2022-01-05 DIAGNOSIS — R262 Difficulty in walking, not elsewhere classified: Secondary | ICD-10-CM

## 2022-01-05 DIAGNOSIS — R293 Abnormal posture: Secondary | ICD-10-CM

## 2022-01-05 DIAGNOSIS — R2681 Unsteadiness on feet: Secondary | ICD-10-CM

## 2022-01-05 DIAGNOSIS — M6281 Muscle weakness (generalized): Secondary | ICD-10-CM

## 2022-01-05 NOTE — Therapy (Addendum)
OUTPATIENT PHYSICAL THERAPY TREATMENT NOTE   Patient Name: April Rogers MRN: [id number] DOB:1940-08-28, 81 y.o., female Today's Date: 01/05/2022  PCP: Emerson Monte, MD REFERRING PROVIDER: Duanne Guess, PA    PT End of Session - 01/05/22 1257     Visit Number 39    Number of Visits 60    Date for PT Re-Evaluation 02/15/22    Authorization Type [employer]    Authorization Time Period 10/21/21-01/07/22 for 20 visits    Authorization - Visit Number 15    Authorization - Number of Visits 20    Progress Note Due on Visit 40    PT Start Time 1302    PT Stop Time 1345    PT Time Calculation (min) 43 min    Equipment Utilized During Treatment Gait belt    Activity Tolerance Patient tolerated treatment well;No increased pain;Patient limited by fatigue    Behavior During Therapy [facility] for tasks assessed/performed             Past Medical History:  Diagnosis Date   Anxiety    Arthritis    Cancer ([place])    skin cancer   Complication of anesthesia    general anesthesia hard on memory after pt goes home   Depression    Fracture of one rib    GERD (gastroesophageal reflux disease)    was on nexium but no longer needs to take this medication   Hypertension    Macular degeneration of both eyes    Osteoporosis    Past Surgical History:  Procedure Laterality Date   ARM SKIN LESION BIOPSY / EXCISION Right    CHOLECYSTECTOMY     COLONOSCOPY WITH PROPOFOL N/A 08/03/2015   Procedure: COLONOSCOPY WITH PROPOFOL;  Surgeon: Hulen Luster, MD;  Location: [facility] ENDOSCOPY;  Service: Gastroenterology;  Laterality: N/A;   INTRAMEDULLARY (IM) NAIL INTERTROCHANTERIC Right 11/26/2015   Procedure: INTRAMEDULLARY (IM) NAIL INTERTROCHANTRIC;  Surgeon: Earnestine Leys, MD;  Location: [facility] ORS;  Service: Orthopedics;  Laterality: Right;   KYPHOPLASTY N/A 08/16/2019   Procedure: L4 KYPHOPLASTY;  Surgeon: Hessie Knows, MD;  Location: [facility] ORS;  Service: Orthopedics;  Laterality: N/A;   KYPHOPLASTY N/A  08/29/2019   Procedure: L2 KYPHOPLASTY;  Surgeon: Hessie Knows, MD;  Location: [facility] ORS;  Service: Orthopedics;  Laterality: N/A;   KYPHOPLASTY N/A 10/10/2019   Procedure: L3 KYPHOPLASTY;  Surgeon: Hessie Knows, MD;  Location: [facility] ORS;  Service: Orthopedics;  Laterality: N/A;   open heart surgery  2019   valve replacement, aortic arch  aneurysm repair   TONSILLECTOMY     TUBAL LIGATION     WRIST RECONSTRUCTION Right    Patient Active Problem List   Diagnosis Date Noted   Femur fracture, right, closed, initial encounter 11/25/2015    REFERRING DIAG: Imbalance  THERAPY DIAG:  Difficulty in walking, not elsewhere classified  Unsteadiness on feet  Muscle weakness (generalized)  Abnormality of gait and mobility  Abnormal posture  Rationale for Evaluation and Treatment Rehabilitation  PERTINENT HISTORY: Pt is a 81 y.o. female referred to PT for gait and imbalance. PMH includes: Anxiety, skin cancer, GERD, HTN, osteoporosis, L2-L4 kyphoplasties 08/29/19, R hip ORIF. Pt reporting for OPPT for gait/imbalance. Was recently receiving OP PT for R shoulder but that has been complete. Pt 's daughter present to assist in history taking. Pt's daughter reports since covid, open heart surgery, recent surgeries causing balance concerns dating from 2019. No falls in last 6 months, 1 fall in the last  year. Near falls reported. Pt is a furniture walker, does use rollator in house. Pt endorsing difficulty with vision, obstacle navigation, clearing feet with walking. Home lay out noted in flow sheets. Pt's goal is to improve balance and strength.   PRECAUTIONS: fall risk  SUBJECTIVE: Pt reports doing well. She reports feeling energized today. No pain and no new complaints;   PAIN:  Are you having pain? No     TODAY'S TREATMENT:  TREATMENT: PT instructed patient in outcome measures, see below:   Patient's condition has the potential to improve in response to therapy. Maximum improvement is yet to  be obtained. The anticipated improvement is attainable and reasonable in a generally predictable time.  Patient reports adherence with HEP. She reports feeling stronger and overall improved mobility;      Tolerated session well. Patient fatigued, but was able to tolerate increased gait/walking with outcome measure assessment.   [facility] PT Assessment - 01/05/22 0001       Observation/Other Assessments   Focus on Therapeutic Outcomes (FOTO)  55%      6 Minute Walk- Baseline   BP (mmHg) 162/65    HR (bpm) 57    02 Sat (%RA) 95 %      6 Minute walk- Post Test   BP (mmHg) 185/69    HR (bpm) 71    02 Sat (%RA) 96 %    Modified Borg Scale for Dyspnea 3- Moderate shortness of breath or breathing difficulty      6 minute walk test results    Aerobic Endurance Distance Walked 790    Endurance additional comments with rollator, improved from 700 feet on 12/02/21, limited community ambulator      Dynamic Gait Index   Level Surface Mild Impairment    Change in Gait Speed Mild Impairment    Gait with Horizontal Head Turns Mild Impairment    Gait with Vertical Head Turns Mild Impairment    Gait and Pivot Turn Mild Impairment    Step Over Obstacle Moderate Impairment    Step Around Obstacles Mild Impairment    Steps Mild Impairment    Total Score 15    DGI comment: slight risk for falls, improved from 11/24 on 08/11/21                       PATIENT EDUCATION: Education details: exercise technique/positioning Person educated: Patient Education method: Explanation, Demonstration, and Verbal cues Education comprehension: verbalized understanding, returned demonstration, verbal cues required, and needs further education   HOME EXERCISE PROGRAM: Continue as previously given;    PT Short Term Goals - 10/21/21 1359       PT SHORT TERM GOAL #1   Title Pt will be independent with HEP to improve posture and balance for ADL completion    Baseline 08/11/21: initiated; Has been doing her  HEP, no questions (09/15/2021) 4/13: doing well    Time 10    Period Weeks    Status Achieved    Target Date 10/20/21              PT Long Term Goals -       PT LONG TERM GOAL #1   Title Pt will improve FOTO to target score of 54 to demonstrate clinically significant improvement in functional mobility    Baseline 08/11/21: 43; 51 (09/15/2021), 5/25: 45% 6/28: 55%   Time 8    Period Weeks    Status Achieved   Target Date 02/15/22  PT LONG TERM GOAL #2   Title Pt will improve 5xSTS to <15 sec without UE support to demonstrate clinically significant improvement in strength and reduce fall risk.    Baseline 08/11/21: 23.08 sec with UE's on arm rests; 23 seconds with UE's on arm rests (09/15/2021), 4/13: 17.07 sec without UE assist; 5/25: 12.5 sec    Time 8    Period Weeks    Status Achieved    Target Date 02/15/22      PT LONG TERM GOAL #3   Title Pt will improve DGI to 19 to demonstrate clinicially significant improvement in falls reduction with community ambulation tasks.    Baseline 08/11/21: 11/24, 4/13: deferred due to time, 5/25: deferred; 6/28: 15/24   Time 8    Period Weeks    Status Partially Met   Target Date 02/15/22      PT LONG TERM GOAL #4   Title Pt will improve normal walking speed to > 0.8 m/s to decrease risk of falls with household walking tasks.    Baseline 08/11/21: 0.58 m/s; 0.71 m/s with rollator walker (09/15/2021), 4/13: 0.69 m/s (regular pace); 5/25: 0.84 m/s without rollator    Time 8    Period Weeks    Status Achieved    Target Date 02/15/22      PT LONG TERM GOAL #5   Title Pt will improve Berg balance by 8 points to display clinically significant improvement in reduced fall risk with ADL's    Baseline 08/18/21: 40/56; 43/56 (09/15/2021), 4/13: 49/56, 5/25: 53/56    Time 8    Period Weeks    Status Achieved    Target Date 02/15/22              Plan -      Clinical Impression Statement Patient motivated and participated well within session. She  was instructed in outcome measure to address progress towards goals. Patient exhibits significant improvement in 6 min walk distance. She continues to have elevated BP indicating deconditioning. She also exhibits improvement in dynamic gait index. Patient does test as a fall risk based on DGI. However her static balance has improved. Patient would benefit from additional skilled PT Intervention to improve strength, balance and mobility;     Personal Factors and Comorbidities Age;Time since onset of injury/illness/exacerbation;Comorbidity 3+;Past/Current Experience    Comorbidities PMH inc;ludes: Anxiety, skin cancer, GERD, HTN, osteoporosis, L2-L4 kyphoplasties 08/29/19, R hip ORIF.    Examination-Activity Limitations Stairs;Bend;Locomotion Level;Stand;Reach Overhead;Carry;Transfers;Squat    Examination-Participation Restrictions Shop;Community Activity    Stability/Clinical Decision Making Stable/Uncomplicated    Rehab Potential Fair    PT Frequency 2x / week    PT Duration 8 weeks    PT Treatment/Interventions ADLs/Self Care Home Management;DME Instruction;Gait training;Stair training;Functional mobility training;Therapeutic activities;Therapeutic exercise;Balance training;Neuromuscular re-education;Patient/family education    PT Home Exercise Plan Access Code [email or web address] with scap retractions for posture; no updates    Consulted and Agree with Plan of Care Patient               Lydia Meng, PT, DPT 01/05/2022, 3:16 PM

## 2022-01-07 ENCOUNTER — Encounter: Payer: Self-pay | Admitting: Physical Therapy

## 2022-01-07 ENCOUNTER — Ambulatory Visit: Payer: Medicare HMO | Admitting: Physical Therapy

## 2022-01-09 NOTE — Therapy (Addendum)
OUTPATIENT PHYSICAL THERAPY TREATMENT NOTE/Physical Therapy Progress Note   Dates of reporting period  12/02/21   to   01/10/22    Patient Name: April Rogers MRN: [id number] DOB:Aug 21, 1940, 81 y.o., female Today's Date: 01/10/2022  PCP: Emerson Monte, MD REFERRING PROVIDER: Duanne Guess, PA    PT End of Session - 01/10/22 1423     Visit Number 40    Number of Visits 46    Date for PT Re-Evaluation 02/15/22    Authorization Type [employer]    Authorization Time Period 10/21/21-01/07/22 for 20 visits    Authorization - Visit Number 15    Authorization - Number of Visits 20    Progress Note Due on Visit 40    PT Start Time 1430    PT Stop Time 1514    PT Time Calculation (min) 44 min    Equipment Utilized During Treatment Gait belt    Activity Tolerance Patient tolerated treatment well;No increased pain;Patient limited by fatigue    Behavior During Therapy [facility] for tasks assessed/performed              Past Medical History:  Diagnosis Date   Anxiety    Arthritis    Cancer ([place])    skin cancer   Complication of anesthesia    general anesthesia hard on memory after pt goes home   Depression    Fracture of one rib    GERD (gastroesophageal reflux disease)    was on nexium but no longer needs to take this medication   Hypertension    Macular degeneration of both eyes    Osteoporosis    Past Surgical History:  Procedure Laterality Date   ARM SKIN LESION BIOPSY / EXCISION Right    CHOLECYSTECTOMY     COLONOSCOPY WITH PROPOFOL N/A 08/03/2015   Procedure: COLONOSCOPY WITH PROPOFOL;  Surgeon: Hulen Luster, MD;  Location: [facility] ENDOSCOPY;  Service: Gastroenterology;  Laterality: N/A;   INTRAMEDULLARY (IM) NAIL INTERTROCHANTERIC Right 11/26/2015   Procedure: INTRAMEDULLARY (IM) NAIL INTERTROCHANTRIC;  Surgeon: Earnestine Leys, MD;  Location: [facility] ORS;  Service: Orthopedics;  Laterality: Right;   KYPHOPLASTY N/A 08/16/2019   Procedure: L4 KYPHOPLASTY;  Surgeon: Hessie Knows, MD;  Location: [facility] ORS;  Service: Orthopedics;  Laterality: N/A;   KYPHOPLASTY N/A 08/29/2019   Procedure: L2 KYPHOPLASTY;  Surgeon: Hessie Knows, MD;  Location: [facility] ORS;  Service: Orthopedics;  Laterality: N/A;   KYPHOPLASTY N/A 10/10/2019   Procedure: L3 KYPHOPLASTY;  Surgeon: Hessie Knows, MD;  Location: [facility] ORS;  Service: Orthopedics;  Laterality: N/A;   open heart surgery  2019   valve replacement, aortic arch  aneurysm repair   TONSILLECTOMY     TUBAL LIGATION     WRIST RECONSTRUCTION Right    Patient Active Problem List   Diagnosis Date Noted   Femur fracture, right, closed, initial encounter 11/25/2015    REFERRING DIAG: Imbalance  THERAPY DIAG:  Difficulty in walking, not elsewhere classified  Unsteadiness on feet  Muscle weakness (generalized)  Abnormality of gait and mobility  Rationale for Evaluation and Treatment Rehabilitation  PERTINENT HISTORY: Pt is a 81 y.o. female referred to PT for gait and imbalance. PMH includes: Anxiety, skin cancer, GERD, HTN, osteoporosis, L2-L4 kyphoplasties 08/29/19, R hip ORIF. Pt reporting for OPPT for gait/imbalance. Was recently receiving OP PT for R shoulder but that has been complete. Pt 's daughter present to assist in history taking. Pt's daughter reports since covid, open heart surgery, recent surgeries causing  balance concerns dating from 2019. No falls in last 6 months, 1 fall in the last year. Near falls reported. Pt is a furniture walker, does use rollator in house. Pt endorsing difficulty with vision, obstacle navigation, clearing feet with walking. Home lay out noted in flow sheets. Pt's goal is to improve balance and strength.   PRECAUTIONS: fall risk  SUBJECTIVE: Patient presents with her brother. No falls or LOB since last session. Got her nails done Friday.  PAIN:  Are you having pain? No     TODAY'S TREATMENT:   Ex: Warm up on Nustep BUE/BLE level  2, interval training x4 min with cues to keep steps  per minute >75 x4 min;seat position 6     Ex: In parallel bars: Side stepping with red tband x10 feet x4 laps each direction with BUE rail assist;   Standing on airex pad:  BUE shoulder extension red tband x10 reps BUE shoulder low row red tband x10 reps; Required mod VCS for proper positioning including to increase erect posture and increase scapular retraction;    Forward walking in parallel bars, 3# x2 laps with BUE rail assist to challenge cardiovascular endurance;  Backward walking unsupported 3# x2 laps in parallel bars, required intermittent UE support and rest breaks    Standing on 1/2 bolster (Round side up): Heel raises x15 reps Toe raises x15 reps; Pt able to exhibit good ROM with minimal difficulty. Does require cues to keep trunk extended and avoid hip flexion/extension to isolate ankle ROM;    Tolerated session well. Patient fatigued, but was able to progress exercise. She did have increased challenge with reducing rail assist;                PATIENT EDUCATION: Education details: exercise technique/positioning Person educated: Patient Education method: Explanation, Demonstration, and Verbal cues Education comprehension: verbalized understanding, returned demonstration, verbal cues required, and needs further education   HOME EXERCISE PROGRAM: Continue as previously given;    PT Short Term Goals - 10/21/21 1359       PT SHORT TERM GOAL #1   Title Pt will be independent with HEP to improve posture and balance for ADL completion    Baseline 08/11/21: initiated; Has been doing her HEP, no questions (09/15/2021) 4/13: doing well    Time 10    Period Weeks    Status Achieved    Target Date 10/20/21              PT Long Term Goals -       PT LONG TERM GOAL #1   Title Pt will improve FOTO to target score of 54 to demonstrate clinically significant improvement in functional mobility    Baseline 08/11/21: 43; 51 (09/15/2021), 5/25: 45% 6/28: 55%   Time 8     Period Weeks    Status Achieved   Target Date 02/15/22      PT LONG TERM GOAL #2   Title Pt will improve 5xSTS to <15 sec without UE support to demonstrate clinically significant improvement in strength and reduce fall risk.    Baseline 08/11/21: 23.08 sec with UE's on arm rests; 23 seconds with UE's on arm rests (09/15/2021), 4/13: 17.07 sec without UE assist; 5/25: 12.5 sec    Time 8    Period Weeks    Status Achieved    Target Date 02/15/22      PT LONG TERM GOAL #3   Title Pt will improve DGI to 19 to demonstrate clinicially significant  improvement in falls reduction with community ambulation tasks.    Baseline 08/11/21: 11/24, 4/13: deferred due to time, 5/25: deferred; 6/28: 15/24   Time 8    Period Weeks    Status Partially Met   Target Date 02/15/22      PT LONG TERM GOAL #4   Title Pt will improve normal walking speed to > 0.8 m/s to decrease risk of falls with household walking tasks.    Baseline 08/11/21: 0.58 m/s; 0.71 m/s with rollator walker (09/15/2021), 4/13: 0.69 m/s (regular pace); 5/25: 0.84 m/s without rollator    Time 8    Period Weeks    Status Achieved    Target Date 02/15/22      PT LONG TERM GOAL #5   Title Pt will improve Berg balance by 8 points to display clinically significant improvement in reduced fall risk with ADL's    Baseline 08/18/21: 40/56; 43/56 (09/15/2021), 4/13: 49/56, 5/25: 53/56    Time 8    Period Weeks    Status Achieved    Target Date 02/15/22              Plan -      Clinical Impression Statement Patient's goals performed on 01/05/22, please refer to this note for further details. Patient presents with excellent motivation and tolerates balance and strengthening interventions well with intermittent rest breaks. Patient's condition has the potential to improve in response to therapy. Maximum improvement is yet to be obtained. The anticipated improvement is attainable and reasonable in a generally predictable time. Patient would benefit from  additional skilled PT Intervention to improve strength, balance and mobility;     Personal Factors and Comorbidities Age;Time since onset of injury/illness/exacerbation;Comorbidity 3+;Past/Current Experience    Comorbidities PMH inc;ludes: Anxiety, skin cancer, GERD, HTN, osteoporosis, L2-L4 kyphoplasties 08/29/19, R hip ORIF.    Examination-Activity Limitations Stairs;Bend;Locomotion Level;Stand;Reach Overhead;Carry;Transfers;Squat    Examination-Participation Restrictions Shop;Community Activity    Stability/Clinical Decision Making Stable/Uncomplicated    Rehab Potential Fair    PT Frequency 2x / week    PT Duration 8 weeks    PT Treatment/Interventions ADLs/Self Care Home Management;DME Instruction;Gait training;Stair training;Functional mobility training;Therapeutic activities;Therapeutic exercise;Balance training;Neuromuscular re-education;Patient/family education    PT Home Exercise Plan Access Code [email or web address] with scap retractions for posture; no updates    Consulted and Agree with Plan of Care Patient               Janna Arch, PT, DPT 01/10/2022, 8:01 PM

## 2022-01-10 ENCOUNTER — Ambulatory Visit: Payer: Medicare HMO | Attending: Orthopedic Surgery

## 2022-01-10 ENCOUNTER — Ambulatory Visit: Payer: Medicare HMO

## 2022-01-10 DIAGNOSIS — R2681 Unsteadiness on feet: Secondary | ICD-10-CM | POA: Insufficient documentation

## 2022-01-10 DIAGNOSIS — M6281 Muscle weakness (generalized): Secondary | ICD-10-CM | POA: Diagnosis not present

## 2022-01-10 DIAGNOSIS — R293 Abnormal posture: Secondary | ICD-10-CM | POA: Diagnosis not present

## 2022-01-10 DIAGNOSIS — R269 Unspecified abnormalities of gait and mobility: Secondary | ICD-10-CM | POA: Diagnosis not present

## 2022-01-10 DIAGNOSIS — R262 Difficulty in walking, not elsewhere classified: Secondary | ICD-10-CM | POA: Diagnosis not present

## 2022-01-12 ENCOUNTER — Encounter: Payer: Self-pay | Admitting: Physical Therapy

## 2022-01-12 ENCOUNTER — Ambulatory Visit: Payer: Medicare HMO

## 2022-01-12 ENCOUNTER — Ambulatory Visit: Payer: Medicare HMO | Admitting: Physical Therapy

## 2022-01-12 DIAGNOSIS — R262 Difficulty in walking, not elsewhere classified: Secondary | ICD-10-CM

## 2022-01-12 DIAGNOSIS — R2681 Unsteadiness on feet: Secondary | ICD-10-CM

## 2022-01-12 DIAGNOSIS — M6281 Muscle weakness (generalized): Secondary | ICD-10-CM

## 2022-01-12 DIAGNOSIS — R293 Abnormal posture: Secondary | ICD-10-CM | POA: Diagnosis not present

## 2022-01-12 DIAGNOSIS — R269 Unspecified abnormalities of gait and mobility: Secondary | ICD-10-CM | POA: Diagnosis not present

## 2022-01-12 NOTE — Therapy (Signed)
OUTPATIENT PHYSICAL THERAPY TREATMENT NOTE      Patient Name: MARCIANA UPLINGER MRN: [id number] DOB:1940-10-13, 81 y.o., female Today's Date: 01/13/2022  PCP: Emerson Monte, MD REFERRING PROVIDER: Duanne Guess, PA    PT End of Session - 01/12/22 1434     Visit Number 41    Number of Visits 68    Date for PT Re-Evaluation 02/15/22    Authorization Type [employer]    Authorization Time Period 10/21/21-01/07/22 for 20 visits    Authorization - Visit Number 15    Authorization - Number of Visits 20    Progress Note Due on Visit 40    PT Start Time 1432    PT Stop Time 1515    PT Time Calculation (min) 43 min    Equipment Utilized During Treatment Gait belt    Activity Tolerance Patient tolerated treatment well;No increased pain;Patient limited by fatigue    Behavior During Therapy [facility] for tasks assessed/performed              Past Medical History:  Diagnosis Date   Anxiety    Arthritis    Cancer ([place])    skin cancer   Complication of anesthesia    general anesthesia hard on memory after pt goes home   Depression    Fracture of one rib    GERD (gastroesophageal reflux disease)    was on nexium but no longer needs to take this medication   Hypertension    Macular degeneration of both eyes    Osteoporosis    Past Surgical History:  Procedure Laterality Date   ARM SKIN LESION BIOPSY / EXCISION Right    CHOLECYSTECTOMY     COLONOSCOPY WITH PROPOFOL N/A 08/03/2015   Procedure: COLONOSCOPY WITH PROPOFOL;  Surgeon: Hulen Luster, MD;  Location: [facility] ENDOSCOPY;  Service: Gastroenterology;  Laterality: N/A;   INTRAMEDULLARY (IM) NAIL INTERTROCHANTERIC Right 11/26/2015   Procedure: INTRAMEDULLARY (IM) NAIL INTERTROCHANTRIC;  Surgeon: Earnestine Leys, MD;  Location: [facility] ORS;  Service: Orthopedics;  Laterality: Right;   KYPHOPLASTY N/A 08/16/2019   Procedure: L4 KYPHOPLASTY;  Surgeon: Hessie Knows, MD;  Location: [facility] ORS;  Service: Orthopedics;  Laterality: N/A;    KYPHOPLASTY N/A 08/29/2019   Procedure: L2 KYPHOPLASTY;  Surgeon: Hessie Knows, MD;  Location: [facility] ORS;  Service: Orthopedics;  Laterality: N/A;   KYPHOPLASTY N/A 10/10/2019   Procedure: L3 KYPHOPLASTY;  Surgeon: Hessie Knows, MD;  Location: [facility] ORS;  Service: Orthopedics;  Laterality: N/A;   open heart surgery  2019   valve replacement, aortic arch  aneurysm repair   TONSILLECTOMY     TUBAL LIGATION     WRIST RECONSTRUCTION Right    Patient Active Problem List   Diagnosis Date Noted   Femur fracture, right, closed, initial encounter 11/25/2015    REFERRING DIAG: Imbalance  THERAPY DIAG:  Difficulty in walking, not elsewhere classified  Unsteadiness on feet  Muscle weakness (generalized)  Abnormality of gait and mobility  Abnormal posture  Rationale for Evaluation and Treatment Rehabilitation  PERTINENT HISTORY: Pt is a 81 y.o. female referred to PT for gait and imbalance. PMH includes: Anxiety, skin cancer, GERD, HTN, osteoporosis, L2-L4 kyphoplasties 08/29/19, R hip ORIF. Pt reporting for OPPT for gait/imbalance. Was recently receiving OP PT for R shoulder but that has been complete. Pt 's daughter present to assist in history taking. Pt's daughter reports since covid, open heart surgery, recent surgeries causing balance concerns dating from 2019. No falls in last 6 months, 1  fall in the last year. Near falls reported. Pt is a furniture walker, does use rollator in house. Pt endorsing difficulty with vision, obstacle navigation, clearing feet with walking. Home lay out noted in flow sheets. Pt's goal is to improve balance and strength.   PRECAUTIONS: fall risk  SUBJECTIVE: Patient presents with her daughter Tye [place]; Reports "Doing okay" but states, "This is not my best week"  Denies any pain;  PAIN:  Are you having pain? No  TODAY'S TREATMENT:   Ex: Warm up on Nustep BUE/BLE level  2-4, interval training x4 min with cues to keep steps per minute >75 x4 min;seat position 9,  arm #8;  Completed 0.15 miles    Ex: In parallel bars: Side stepping with red tband x10 feet x2 laps each direction with BUE rail assist; Required cues to keep foot oriented forward for better abductor strengthening;     Standing on 1/2 bolster (Round side up): Heel raises x15 reps Toe raises x15 reps; Pt able to exhibit good ROM with minimal difficulty. Does require cues to keep trunk extended and avoid hip flexion/extension to isolate ankle ROM;   NMR: Standing on  bolster (flat side up) -feet apart: -heel/toe raises x15 reps -BUE wand flexion x10 reps; Required increased time, reports significant difficulty requiring min VCS for weight shift and to keep foot oriented to neutral for better positioning;    -tandem stance:   2-0 rail assist, 10 sec hold x2 reps each foot in front, min A for safety;     Weaving around cones on level ground #5 x4 laps with cues to increase step length and keep feet apart for better safety when negotiating obstacles. Patient does require CGA to min A for safety; She was able to exhibit better reciprocal pattern with increased repetition;    Tolerated session fair. Patient fatigued, but was able to progress exercise. She did have increased challenge with reducing rail assist;                PATIENT EDUCATION: Education details: exercise technique/positioning Person educated: Patient Education method: Explanation, Demonstration, and Verbal cues Education comprehension: verbalized understanding, returned demonstration, verbal cues required, and needs further education   HOME EXERCISE PROGRAM: Continue as previously given;    PT Short Term Goals - 10/21/21 1359       PT SHORT TERM GOAL #1   Title Pt will be independent with HEP to improve posture and balance for ADL completion    Baseline 08/11/21: initiated; Has been doing her HEP, no questions (09/15/2021) 4/13: doing well    Time 10    Period Weeks    Status Achieved    Target Date  10/20/21              PT Long Term Goals -       PT LONG TERM GOAL #1   Title Pt will improve FOTO to target score of 54 to demonstrate clinically significant improvement in functional mobility    Baseline 08/11/21: 43; 51 (09/15/2021), 5/25: 45% 6/28: 55%   Time 8    Period Weeks    Status Achieved   Target Date 02/15/22      PT LONG TERM GOAL #2   Title Pt will improve 5xSTS to <15 sec without UE support to demonstrate clinically significant improvement in strength and reduce fall risk.    Baseline 08/11/21: 23.08 sec with UE's on arm rests; 23 seconds with UE's on arm rests (09/15/2021), 4/13: 17.07 sec without UE  assist; 5/25: 12.5 sec    Time 8    Period Weeks    Status Achieved    Target Date 02/15/22      PT LONG TERM GOAL #3   Title Pt will improve DGI to 19 to demonstrate clinicially significant improvement in falls reduction with community ambulation tasks.    Baseline 08/11/21: 11/24, 4/13: deferred due to time, 5/25: deferred; 6/28: 15/24   Time 8    Period Weeks    Status Partially Met   Target Date 02/15/22      PT LONG TERM GOAL #4   Title Pt will improve normal walking speed to > 0.8 m/s to decrease risk of falls with household walking tasks.    Baseline 08/11/21: 0.58 m/s; 0.71 m/s with rollator walker (09/15/2021), 4/13: 0.69 m/s (regular pace); 5/25: 0.84 m/s without rollator    Time 8    Period Weeks    Status Achieved    Target Date 02/15/22      PT LONG TERM GOAL #5   Title Pt will improve Berg balance by 8 points to display clinically significant improvement in reduced fall risk with ADL's    Baseline 08/18/21: 40/56; 43/56 (09/15/2021), 4/13: 49/56, 5/25: 53/56    Time 8    Period Weeks    Status Achieved    Target Date 02/15/22              Plan -      Clinical Impression Statement Patient motivated and participated fair within session. She was more tearful this session and expressed not having her best week. Later PT found out patient is feel grief  over losing loved one this time last year. Session focused on LE strengthening and balance exercise. She was challenged with standing on 1/2 bolster with decreased ankle control. She required min A and min VCs for weight shift and proper positioning for stance control. Patient would benefit from additional skilled PT intervention to improve strength, balance and gait safety;     Personal Factors and Comorbidities Age;Time since onset of injury/illness/exacerbation;Comorbidity 3+;Past/Current Experience    Comorbidities PMH inc;ludes: Anxiety, skin cancer, GERD, HTN, osteoporosis, L2-L4 kyphoplasties 08/29/19, R hip ORIF.    Examination-Activity Limitations Stairs;Bend;Locomotion Level;Stand;Reach Overhead;Carry;Transfers;Squat    Examination-Participation Restrictions Shop;Community Activity    Stability/Clinical Decision Making Stable/Uncomplicated    Rehab Potential Fair    PT Frequency 2x / week    PT Duration 8 weeks    PT Treatment/Interventions ADLs/Self Care Home Management;DME Instruction;Gait training;Stair training;Functional mobility training;Therapeutic activities;Therapeutic exercise;Balance training;Neuromuscular re-education;Patient/family education    PT Home Exercise Plan Access Code [email or web address] with scap retractions for posture; no updates    Consulted and Agree with Plan of Care Patient               Bryan Omura, PT, DPT 01/13/2022, 9:09 AM

## 2022-01-13 ENCOUNTER — Ambulatory Visit: Payer: Medicare HMO | Admitting: Physical Therapy

## 2022-01-17 ENCOUNTER — Ambulatory Visit: Payer: Medicare HMO

## 2022-01-17 DIAGNOSIS — R262 Difficulty in walking, not elsewhere classified: Secondary | ICD-10-CM | POA: Diagnosis not present

## 2022-01-17 DIAGNOSIS — M6281 Muscle weakness (generalized): Secondary | ICD-10-CM

## 2022-01-17 DIAGNOSIS — R293 Abnormal posture: Secondary | ICD-10-CM | POA: Diagnosis not present

## 2022-01-17 DIAGNOSIS — R269 Unspecified abnormalities of gait and mobility: Secondary | ICD-10-CM | POA: Diagnosis not present

## 2022-01-17 DIAGNOSIS — R2681 Unsteadiness on feet: Secondary | ICD-10-CM | POA: Diagnosis not present

## 2022-01-17 NOTE — Therapy (Signed)
OUTPATIENT PHYSICAL THERAPY TREATMENT NOTE      Patient Name: April Rogers MRN: [id number] DOB:28-Apr-1941, 81 y.o., female Today's Date: 01/17/2022  PCP: Emerson Monte, MD REFERRING PROVIDER: Duanne Guess, PA    PT End of Session - 01/17/22 1255     Visit Number 42    Number of Visits 25    Date for PT Re-Evaluation 02/15/22    Authorization Type [employer]    Authorization Time Period 10/21/21-01/07/22 for 20 visits    Authorization - Visit Number 100    Authorization - Number of Visits 20    Progress Note Due on Visit 40    PT Start Time 1300    PT Stop Time 1344    PT Time Calculation (min) 44 min    Equipment Utilized During Treatment Gait belt    Activity Tolerance Patient tolerated treatment well;No increased pain;Patient limited by fatigue    Behavior During Therapy [facility] for tasks assessed/performed               Past Medical History:  Diagnosis Date   Anxiety    Arthritis    Cancer ([place])    skin cancer   Complication of anesthesia    general anesthesia hard on memory after pt goes home   Depression    Fracture of one rib    GERD (gastroesophageal reflux disease)    was on nexium but no longer needs to take this medication   Hypertension    Macular degeneration of both eyes    Osteoporosis    Past Surgical History:  Procedure Laterality Date   ARM SKIN LESION BIOPSY / EXCISION Right    CHOLECYSTECTOMY     COLONOSCOPY WITH PROPOFOL N/A 08/03/2015   Procedure: COLONOSCOPY WITH PROPOFOL;  Surgeon: Hulen Luster, MD;  Location: [facility] ENDOSCOPY;  Service: Gastroenterology;  Laterality: N/A;   INTRAMEDULLARY (IM) NAIL INTERTROCHANTERIC Right 11/26/2015   Procedure: INTRAMEDULLARY (IM) NAIL INTERTROCHANTRIC;  Surgeon: Earnestine Leys, MD;  Location: [facility] ORS;  Service: Orthopedics;  Laterality: Right;   KYPHOPLASTY N/A 08/16/2019   Procedure: L4 KYPHOPLASTY;  Surgeon: Hessie Knows, MD;  Location: [facility] ORS;  Service: Orthopedics;  Laterality: N/A;    KYPHOPLASTY N/A 08/29/2019   Procedure: L2 KYPHOPLASTY;  Surgeon: Hessie Knows, MD;  Location: [facility] ORS;  Service: Orthopedics;  Laterality: N/A;   KYPHOPLASTY N/A 10/10/2019   Procedure: L3 KYPHOPLASTY;  Surgeon: Hessie Knows, MD;  Location: [facility] ORS;  Service: Orthopedics;  Laterality: N/A;   open heart surgery  2019   valve replacement, aortic arch  aneurysm repair   TONSILLECTOMY     TUBAL LIGATION     WRIST RECONSTRUCTION Right    Patient Active Problem List   Diagnosis Date Noted   Femur fracture, right, closed, initial encounter 11/25/2015    REFERRING DIAG: Imbalance  THERAPY DIAG:  Difficulty in walking, not elsewhere classified  Unsteadiness on feet  Muscle weakness (generalized)  Abnormality of gait and mobility  Rationale for Evaluation and Treatment Rehabilitation  PERTINENT HISTORY: Pt is a 81 y.o. female referred to PT for gait and imbalance. PMH includes: Anxiety, skin cancer, GERD, HTN, osteoporosis, L2-L4 kyphoplasties 08/29/19, R hip ORIF. Pt reporting for OPPT for gait/imbalance. Was recently receiving OP PT for R shoulder but that has been complete. Pt 's daughter present to assist in history taking. Pt's daughter reports since covid, open heart surgery, recent surgeries causing balance concerns dating from 2019. No falls in last 6 months, 1 fall in  the last year. Near falls reported. Pt is a furniture walker, does use rollator in house. Pt endorsing difficulty with vision, obstacle navigation, clearing feet with walking. Home lay out noted in flow sheets. Pt's goal is to improve balance and strength.   PRECAUTIONS: fall risk  SUBJECTIVE: Patient pain began Sunday in L hip going to groin and intestine region. Thinks she hit her side coming into her daughters car on Saturday. No visible bruising but tenderness to palpation  PAIN:  Are you having pain? No and Yes: NPRS scale: 3/10 Pain location: L hip  Pain description: radiating to back and into  groin Aggravating factors: bending and flexing Relieving factors: rest  TODAY'S TREATMENT:   L hip flexor standing sretch 30 seconds Lateral step holding bar 4x length of // bars Hip extension 10x each LE; heavy BUE support  Heel raise 10x 3 seconds with UE support Letter/color sorting standing without UE support x6 minutes   hedgehog taps 8x each side with UE support for coordination   Standing on firm surface:   BUE shoulder extension red tband x10 reps BUE shoulder low row red tband x10 reps;  Tolerated session fair. Patient fatigued, but was able to progress exercise. She did have increased challenge with reducing rail assist;                PATIENT EDUCATION: Education details: exercise technique/positioning Person educated: Patient Education method: Explanation, Demonstration, and Verbal cues Education comprehension: verbalized understanding, returned demonstration, verbal cues required, and needs further education   HOME EXERCISE PROGRAM: Continue as previously given;    PT Short Term Goals - 10/21/21 1359       PT SHORT TERM GOAL #1   Title Pt will be independent with HEP to improve posture and balance for ADL completion    Baseline 08/11/21: initiated; Has been doing her HEP, no questions (09/15/2021) 4/13: doing well    Time 10    Period Weeks    Status Achieved    Target Date 10/20/21              PT Long Term Goals -       PT LONG TERM GOAL #1   Title Pt will improve FOTO to target score of 54 to demonstrate clinically significant improvement in functional mobility    Baseline 08/11/21: 43; 51 (09/15/2021), 5/25: 45% 6/28: 55%   Time 8    Period Weeks    Status Achieved   Target Date 02/15/22      PT LONG TERM GOAL #2   Title Pt will improve 5xSTS to <15 sec without UE support to demonstrate clinically significant improvement in strength and reduce fall risk.    Baseline 08/11/21: 23.08 sec with UE's on arm rests; 23 seconds with UE's on arm  rests (09/15/2021), 4/13: 17.07 sec without UE assist; 5/25: 12.5 sec    Time 8    Period Weeks    Status Achieved    Target Date 02/15/22      PT LONG TERM GOAL #3   Title Pt will improve DGI to 19 to demonstrate clinicially significant improvement in falls reduction with community ambulation tasks.    Baseline 08/11/21: 11/24, 4/13: deferred due to time, 5/25: deferred; 6/28: 15/24   Time 8    Period Weeks    Status Partially Met   Target Date 02/15/22      PT LONG TERM GOAL #4   Title Pt will improve normal walking speed to > 0.8  m/s to decrease risk of falls with household walking tasks.    Baseline 08/11/21: 0.58 m/s; 0.71 m/s with rollator walker (09/15/2021), 4/13: 0.69 m/s (regular pace); 5/25: 0.84 m/s without rollator    Time 8    Period Weeks    Status Achieved    Target Date 02/15/22      PT LONG TERM GOAL #5   Title Pt will improve Berg balance by 8 points to display clinically significant improvement in reduced fall risk with ADL's    Baseline 08/18/21: 40/56; 43/56 (09/15/2021), 4/13: 49/56, 5/25: 53/56    Time 8    Period Weeks    Status Achieved    Target Date 02/15/22              Plan -      Clinical Impression Statement Patient session is limited by new onset of L groin pain. Patient has no visible bruising but does have tenderness to palpation. Patient highly motivated despite pain and tolerates extension based interventions well. Patient would benefit from additional skilled PT intervention to improve strength, balance and gait safety;     Personal Factors and Comorbidities Age;Time since onset of injury/illness/exacerbation;Comorbidity 3+;Past/Current Experience    Comorbidities PMH inc;ludes: Anxiety, skin cancer, GERD, HTN, osteoporosis, L2-L4 kyphoplasties 08/29/19, R hip ORIF.    Examination-Activity Limitations Stairs;Bend;Locomotion Level;Stand;Reach Overhead;Carry;Transfers;Squat    Examination-Participation Restrictions Shop;Community Activity     Stability/Clinical Decision Making Stable/Uncomplicated    Rehab Potential Fair    PT Frequency 2x / week    PT Duration 8 weeks    PT Treatment/Interventions ADLs/Self Care Home Management;DME Instruction;Gait training;Stair training;Functional mobility training;Therapeutic activities;Therapeutic exercise;Balance training;Neuromuscular re-education;Patient/family education    PT Home Exercise Plan Access Code [email or web address] with scap retractions for posture; no updates    Consulted and Agree with Plan of Care Patient               Janna Arch, PT, DPT 01/17/2022, 3:29 PM

## 2022-01-18 NOTE — Therapy (Signed)
OUTPATIENT PHYSICAL THERAPY TREATMENT NOTE      Patient Name: April Rogers MRN: [id number] DOB:04-14-1941, 81 y.o., female Today's Date: 01/19/2022  PCP: Asa Lente, MD REFERRING PROVIDER: Evon Slack, PA    PT End of Session - 01/19/22 1253     Visit Number 43    Number of Visits 53    Date for PT Re-Evaluation 02/15/22    Authorization Type [employer]    Authorization Time Period 10/21/21-01/07/22 for 20 visits    Authorization - Visit Number 17    Authorization - Number of Visits 20    Progress Note Due on Visit 40    PT Start Time 1300    PT Stop Time 1344    PT Time Calculation (min) 44 min    Equipment Utilized During Treatment Gait belt    Activity Tolerance Patient tolerated treatment well;No increased pain;Patient limited by fatigue    Behavior During Therapy [facility] for tasks assessed/performed               Past Medical History:  Diagnosis Date   Anxiety    Arthritis    Cancer ([facility])    skin cancer   Complication of anesthesia    general anesthesia hard on memory after pt goes home   Depression    Fracture of one rib    GERD (gastroesophageal reflux disease)    was on nexium but no longer needs to take this medication   Hypertension    Macular degeneration of both eyes    Osteoporosis    Past Surgical History:  Procedure Laterality Date   ARM SKIN LESION BIOPSY / EXCISION Right    CHOLECYSTECTOMY     COLONOSCOPY WITH PROPOFOL N/A 08/03/2015   Procedure: COLONOSCOPY WITH PROPOFOL;  Surgeon: Wallace Cullens, MD;  Location: [facility] ENDOSCOPY;  Service: Gastroenterology;  Laterality: N/A;   INTRAMEDULLARY (IM) NAIL INTERTROCHANTERIC Right 11/26/2015   Procedure: INTRAMEDULLARY (IM) NAIL INTERTROCHANTRIC;  Surgeon: Deeann Saint, MD;  Location: [facility] ORS;  Service: Orthopedics;  Laterality: Right;   KYPHOPLASTY N/A 08/16/2019   Procedure: L4 KYPHOPLASTY;  Surgeon: Kennedy Bucker, MD;  Location: [facility] ORS;  Service: Orthopedics;  Laterality: N/A;    KYPHOPLASTY N/A 08/29/2019   Procedure: L2 KYPHOPLASTY;  Surgeon: Kennedy Bucker, MD;  Location: [facility] ORS;  Service: Orthopedics;  Laterality: N/A;   KYPHOPLASTY N/A 10/10/2019   Procedure: L3 KYPHOPLASTY;  Surgeon: Kennedy Bucker, MD;  Location: [facility] ORS;  Service: Orthopedics;  Laterality: N/A;   open heart surgery  2019   valve replacement, aortic arch  aneurysm repair   TONSILLECTOMY     TUBAL LIGATION     WRIST RECONSTRUCTION Right    Patient Active Problem List   Diagnosis Date Noted   Femur fracture, right, closed, initial encounter 11/25/2015    REFERRING DIAG: Imbalance  THERAPY DIAG:  Difficulty in walking, not elsewhere classified  Unsteadiness on feet  Muscle weakness (generalized)  Rationale for Evaluation and Treatment Rehabilitation  PERTINENT HISTORY: Pt is a 81 y.o. female referred to PT for gait and imbalance. PMH includes: Anxiety, skin cancer, GERD, HTN, osteoporosis, L2-L4 kyphoplasties 08/29/19, R hip ORIF. Pt reporting for OPPT for gait/imbalance. Was recently receiving OP PT for R shoulder but that has been complete. Pt 's daughter present to assist in history taking. Pt's daughter reports since covid, open heart surgery, recent surgeries causing balance concerns dating from 2019. No falls in last 6 months, 1 fall in the last year. Near falls reported.  Pt is a furniture walker, does use rollator in house. Pt endorsing difficulty with vision, obstacle navigation, clearing feet with walking. Home lay out noted in flow sheets. Pt's goal is to improve balance and strength.   PRECAUTIONS: fall risk  SUBJECTIVE: This week is patient's anniversary of losing her partner. Is wearing his shirt in memory of him.   PAIN:  Are you having pain? No  TODAY'S TREATMENT:   Ex: Warm up on Nustep BUE/BLE level  2-4, interval training x4 min with cues to keep steps per minute >75 x4 min;seat position 8, arm #10;  Completed 0.15 miles    Ex: Next to support bar: RTB around  ankles: -side stepping 2x length of // bars -hip extension 10x each LE -hip flexion 10x each LE     NMR: Standing with CGA next to support surface:  Airex pad: static stand 30 seconds x 2 trials, noticeable trembling of ankles/LE's with fatigue and challenge to maintain stability Airex pad: horizontal head turns 30 seconds scanning room 10x ; cueing for arc of motion  Airex pad: vertical head turns 30 seconds, cueing for arc of motion, noticeable sway with upward gaze increasing demand on ankle righting reaction musculature On airex pad:  BUE shoulder extension red tband x10 reps BUE shoulder low row red tband x10 reps;    Stepping over orange hurdle, single leg,  x10 reps each LE with intermittent rail assist   Backwards walking in // bars 4x length of // bars   Tolerated session fair. Patient fatigued, but was able to progress exercise. She did have increased challenge with reducing rail assist;                PATIENT EDUCATION: Education details: exercise technique/positioning Person educated: Patient Education method: Explanation, Demonstration, and Verbal cues Education comprehension: verbalized understanding, returned demonstration, verbal cues required, and needs further education   HOME EXERCISE PROGRAM: Continue as previously given;    PT Short Term Goals - 10/21/21 1359       PT SHORT TERM GOAL #1   Title Pt will be independent with HEP to improve posture and balance for ADL completion    Baseline 08/11/21: initiated; Has been doing her HEP, no questions (09/15/2021) 4/13: doing well    Time 10    Period Weeks    Status Achieved    Target Date 10/20/21              PT Long Term Goals -       PT LONG TERM GOAL #1   Title Pt will improve FOTO to target score of 54 to demonstrate clinically significant improvement in functional mobility    Baseline 08/11/21: 43; 51 (09/15/2021), 5/25: 45% 6/28: 55%   Time 8    Period Weeks    Status Achieved   Target  Date 02/15/22      PT LONG TERM GOAL #2   Title Pt will improve 5xSTS to <15 sec without UE support to demonstrate clinically significant improvement in strength and reduce fall risk.    Baseline 08/11/21: 23.08 sec with UE's on arm rests; 23 seconds with UE's on arm rests (09/15/2021), 4/13: 17.07 sec without UE assist; 5/25: 12.5 sec    Time 8    Period Weeks    Status Achieved    Target Date 02/15/22      PT LONG TERM GOAL #3   Title Pt will improve DGI to 19 to demonstrate clinicially significant improvement in falls reduction  with community ambulation tasks.    Baseline 08/11/21: 11/24, 4/13: deferred due to time, 5/25: deferred; 6/28: 15/24   Time 8    Period Weeks    Status Partially Met   Target Date 02/15/22      PT LONG TERM GOAL #4   Title Pt will improve normal walking speed to > 0.8 m/s to decrease risk of falls with household walking tasks.    Baseline 08/11/21: 0.58 m/s; 0.71 m/s with rollator walker (09/15/2021), 4/13: 0.69 m/s (regular pace); 5/25: 0.84 m/s without rollator    Time 8    Period Weeks    Status Achieved    Target Date 02/15/22      PT LONG TERM GOAL #5   Title Pt will improve Berg balance by 8 points to display clinically significant improvement in reduced fall risk with ADL's    Baseline 08/18/21: 16/10; 43/56 (09/15/2021), 4/13: 49/56, 5/25: 53/56    Time 8    Period Weeks    Status Achieved    Target Date 02/15/22              Plan -      Clinical Impression Statement Patient has decreased pain this session and is able to tolerate all interventions without pain increase. Decreased instability on airex pad noted this session with improved ankle righting reactions. Step over for carryover to shower negotiation. She required min A and min VCs for weight shift and proper positioning for stance control. Patient would benefit from additional skilled PT intervention to improve strength, balance and gait safety;     Personal Factors and Comorbidities Age;Time  since onset of injury/illness/exacerbation;Comorbidity 3+;Past/Current Experience    Comorbidities PMH inc;ludes: Anxiety, skin cancer, GERD, HTN, osteoporosis, L2-L4 kyphoplasties 08/29/19, R hip ORIF.    Examination-Activity Limitations Stairs;Bend;Locomotion Level;Stand;Reach Overhead;Carry;Transfers;Squat    Examination-Participation Restrictions Shop;Community Activity    Stability/Clinical Decision Making Stable/Uncomplicated    Rehab Potential Fair    PT Frequency 2x / week    PT Duration 8 weeks    PT Treatment/Interventions ADLs/Self Care Home Management;DME Instruction;Gait training;Stair training;Functional mobility training;Therapeutic activities;Therapeutic exercise;Balance training;Neuromuscular re-education;Patient/family education    PT Home Exercise Plan Access Code [email or web address] with scap retractions for posture; no updates    Consulted and Agree with Plan of Care Patient               Precious Bard, PT, DPT 01/19/2022, 1:44 PM

## 2022-01-19 ENCOUNTER — Ambulatory Visit: Payer: Medicare HMO

## 2022-01-19 ENCOUNTER — Ambulatory Visit: Payer: Medicare HMO | Admitting: Physical Therapy

## 2022-01-19 DIAGNOSIS — R269 Unspecified abnormalities of gait and mobility: Secondary | ICD-10-CM | POA: Diagnosis not present

## 2022-01-19 DIAGNOSIS — M6281 Muscle weakness (generalized): Secondary | ICD-10-CM | POA: Diagnosis not present

## 2022-01-19 DIAGNOSIS — R293 Abnormal posture: Secondary | ICD-10-CM | POA: Diagnosis not present

## 2022-01-19 DIAGNOSIS — R262 Difficulty in walking, not elsewhere classified: Secondary | ICD-10-CM

## 2022-01-19 DIAGNOSIS — R2681 Unsteadiness on feet: Secondary | ICD-10-CM | POA: Diagnosis not present

## 2022-01-24 ENCOUNTER — Ambulatory Visit: Payer: Medicare HMO | Admitting: Physical Therapy

## 2022-01-24 ENCOUNTER — Encounter: Payer: Self-pay | Admitting: Physical Therapy

## 2022-01-24 DIAGNOSIS — R2681 Unsteadiness on feet: Secondary | ICD-10-CM | POA: Diagnosis not present

## 2022-01-24 DIAGNOSIS — R269 Unspecified abnormalities of gait and mobility: Secondary | ICD-10-CM

## 2022-01-24 DIAGNOSIS — M6281 Muscle weakness (generalized): Secondary | ICD-10-CM | POA: Diagnosis not present

## 2022-01-24 DIAGNOSIS — R293 Abnormal posture: Secondary | ICD-10-CM

## 2022-01-24 DIAGNOSIS — R262 Difficulty in walking, not elsewhere classified: Secondary | ICD-10-CM | POA: Diagnosis not present

## 2022-01-24 NOTE — Therapy (Signed)
OUTPATIENT PHYSICAL THERAPY TREATMENT NOTE      Patient Name: April Rogers MRN: [id number] DOB:1940-09-21, 81 y.o., female Today's Date: 01/24/2022  PCP: Emerson Monte, MD REFERRING PROVIDER: Duanne Guess, PA    PT End of Session - 01/24/22 1429     Visit Number 44    Number of Visits 58    Date for PT Re-Evaluation 02/15/22    Authorization Type [employer]    Authorization Time Period 10/21/21-01/07/22 for 20 visits    Authorization - Visit Number [id number]    Authorization - Number of Visits 20    Progress Note Due on Visit 40    PT Start Time 1429    PT Stop Time 1516    PT Time Calculation (min) 47 min    Equipment Utilized During Treatment Gait belt    Activity Tolerance Patient tolerated treatment well;No increased pain;Patient limited by fatigue    Behavior During Therapy [facility] for tasks assessed/performed               Past Medical History:  Diagnosis Date   Anxiety    Arthritis    Cancer ([place])    skin cancer   Complication of anesthesia    general anesthesia hard on memory after pt goes home   Depression    Fracture of one rib    GERD (gastroesophageal reflux disease)    was on nexium but no longer needs to take this medication   Hypertension    Macular degeneration of both eyes    Osteoporosis    Past Surgical History:  Procedure Laterality Date   ARM SKIN LESION BIOPSY / EXCISION Right    CHOLECYSTECTOMY     COLONOSCOPY WITH PROPOFOL N/A 08/03/2015   Procedure: COLONOSCOPY WITH PROPOFOL;  Surgeon: Hulen Luster, MD;  Location: [facility] ENDOSCOPY;  Service: Gastroenterology;  Laterality: N/A;   INTRAMEDULLARY (IM) NAIL INTERTROCHANTERIC Right 11/26/2015   Procedure: INTRAMEDULLARY (IM) NAIL INTERTROCHANTRIC;  Surgeon: Earnestine Leys, MD;  Location: [facility] ORS;  Service: Orthopedics;  Laterality: Right;   KYPHOPLASTY N/A 08/16/2019   Procedure: L4 KYPHOPLASTY;  Surgeon: Hessie Knows, MD;  Location: [facility] ORS;  Service: Orthopedics;  Laterality: N/A;    KYPHOPLASTY N/A 08/29/2019   Procedure: L2 KYPHOPLASTY;  Surgeon: Hessie Knows, MD;  Location: [facility] ORS;  Service: Orthopedics;  Laterality: N/A;   KYPHOPLASTY N/A 10/10/2019   Procedure: L3 KYPHOPLASTY;  Surgeon: Hessie Knows, MD;  Location: [facility] ORS;  Service: Orthopedics;  Laterality: N/A;   open heart surgery  2019   valve replacement, aortic arch  aneurysm repair   TONSILLECTOMY     TUBAL LIGATION     WRIST RECONSTRUCTION Right    Patient Active Problem List   Diagnosis Date Noted   Femur fracture, right, closed, initial encounter 11/25/2015    REFERRING DIAG: Imbalance  THERAPY DIAG:  Difficulty in walking, not elsewhere classified  Unsteadiness on feet  Muscle weakness (generalized)  Abnormality of gait and mobility  Abnormal posture  Rationale for Evaluation and Treatment Rehabilitation  PERTINENT HISTORY: Pt is a 81 y.o. female referred to PT for gait and imbalance. PMH includes: Anxiety, skin cancer, GERD, HTN, osteoporosis, L2-L4 kyphoplasties 08/29/19, R hip ORIF. Pt reporting for OPPT for gait/imbalance. Was recently receiving OP PT for R shoulder but that has been complete. Pt 's daughter present to assist in history taking. Pt's daughter reports since covid, open heart surgery, recent surgeries causing balance concerns dating from 2019. No falls in last 6 months,  1 fall in the last year. Near falls reported. Pt is a furniture walker, does use rollator in house. Pt endorsing difficulty with vision, obstacle navigation, clearing feet with walking. Home lay out noted in flow sheets. Pt's goal is to improve balance and strength.   PRECAUTIONS: fall risk  SUBJECTIVE: Pt reports doing okay. Her daughter states she has been doing well and was able to go shopping this weekend. She is getting ready for her family coming up from [place] this weekend; She has a big family reunion planned this weekend;   PAIN:  Are you having pain? No  TODAY'S TREATMENT:   Ex: Warm up on Nustep  BUE/BLE level  2-4, interval training x4 min with cues to keep steps per minute >75 x4 min;seat position 9, arm #8;  Completed 0.12 miles    Ex: Next to support bar: RTB around ankles: -hip abduction x10 reps each LE -hip extension 10x each LE  Walking in gym: Forward high march x20 feet Backward walking x20 feet x2 laps Forward walk with large steps x20 feet; Required CGA for safety especially with backward walking. She required cues for increased step length for better foot clearance;   Seated: Hip flexion/abduction/adduction lifting foot over 1/2 bolster x10 reps each LE; Pt required min VCs to increase ROM for better foot clearance;   Standing on 1/2 bolster (Round side up): Heel raises x15 reps Toe raises x15 reps; Pt able to exhibit good ROM with minimal difficulty. Does require cues to keep trunk extended and avoid hip flexion/extension to isolate ankle ROM;    NMR: Standing on  bolster (round side up)  -tandem stance:  1-0 rail assist, 10 sec hold x2 reps each foot in front, min A for safety;    Tolerated session fair. Patient fatigued, but was able to progress exercise. She did have increased challenge with reducing rail assist;                PATIENT EDUCATION: Education details: exercise technique/positioning Person educated: Patient Education method: Explanation, Demonstration, and Verbal cues Education comprehension: verbalized understanding, returned demonstration, verbal cues required, and needs further education   HOME EXERCISE PROGRAM: Continue as previously given;    PT Short Term Goals - 10/21/21 1359       PT SHORT TERM GOAL #1   Title Pt will be independent with HEP to improve posture and balance for ADL completion    Baseline 08/11/21: initiated; Has been doing her HEP, no questions (09/15/2021) 4/13: doing well    Time 10    Period Weeks    Status Achieved    Target Date 10/20/21              PT Long Term Goals -       PT LONG  TERM GOAL #1   Title Pt will improve FOTO to target score of 54 to demonstrate clinically significant improvement in functional mobility    Baseline 08/11/21: 43; 51 (09/15/2021), 5/25: 45% 6/28: 55%   Time 8    Period Weeks    Status Achieved   Target Date 02/15/22      PT LONG TERM GOAL #2   Title Pt will improve 5xSTS to <15 sec without UE support to demonstrate clinically significant improvement in strength and reduce fall risk.    Baseline 08/11/21: 23.08 sec with UE's on arm rests; 23 seconds with UE's on arm rests (09/15/2021), 4/13: 17.07 sec without UE assist; 5/25: 12.5 sec    Time  8    Period Weeks    Status Achieved    Target Date 02/15/22      PT LONG TERM GOAL #3   Title Pt will improve DGI to 19 to demonstrate clinicially significant improvement in falls reduction with community ambulation tasks.    Baseline 08/11/21: 11/24, 4/13: deferred due to time, 5/25: deferred; 6/28: 15/24   Time 8    Period Weeks    Status Partially Met   Target Date 02/15/22      PT LONG TERM GOAL #4   Title Pt will improve normal walking speed to > 0.8 m/s to decrease risk of falls with household walking tasks.    Baseline 08/11/21: 0.58 m/s; 0.71 m/s with rollator walker (09/15/2021), 4/13: 0.69 m/s (regular pace); 5/25: 0.84 m/s without rollator    Time 8    Period Weeks    Status Achieved    Target Date 02/15/22      PT LONG TERM GOAL #5   Title Pt will improve Berg balance by 8 points to display clinically significant improvement in reduced fall risk with ADL's    Baseline 08/18/21: 40/56; 43/56 (09/15/2021), 4/13: 49/56, 5/25: 53/56    Time 8    Period Weeks    Status Achieved    Target Date 02/15/22              Plan -      Clinical Impression Statement Patient motivated and participated well within session. She was instructed in advanced LE strengthening. Patient required min VCs for proper positioning/exercise technique. She is challenged with standing on compliant surface with decreased  ankle control. Patient would benefit from additional skilled PT intervention to improve strength, balance and gait safety;    Personal Factors and Comorbidities Age;Time since onset of injury/illness/exacerbation;Comorbidity 3+;Past/Current Experience    Comorbidities PMH inc;ludes: Anxiety, skin cancer, GERD, HTN, osteoporosis, L2-L4 kyphoplasties 08/29/19, R hip ORIF.    Examination-Activity Limitations Stairs;Bend;Locomotion Level;Stand;Reach Overhead;Carry;Transfers;Squat    Examination-Participation Restrictions Shop;Community Activity    Stability/Clinical Decision Making Stable/Uncomplicated    Rehab Potential Fair    PT Frequency 2x / week    PT Duration 8 weeks    PT Treatment/Interventions ADLs/Self Care Home Management;DME Instruction;Gait training;Stair training;Functional mobility training;Therapeutic activities;Therapeutic exercise;Balance training;Neuromuscular re-education;Patient/family education    PT Home Exercise Plan Access Code [email or web address] with scap retractions for posture; no updates    Consulted and Agree with Plan of Care Patient               Oaklan Persons, PT, DPT 01/24/2022, 2:30 PM

## 2022-01-26 ENCOUNTER — Ambulatory Visit: Payer: Medicare HMO | Admitting: Physical Therapy

## 2022-01-26 DIAGNOSIS — R2681 Unsteadiness on feet: Secondary | ICD-10-CM

## 2022-01-26 DIAGNOSIS — R262 Difficulty in walking, not elsewhere classified: Secondary | ICD-10-CM | POA: Diagnosis not present

## 2022-01-26 DIAGNOSIS — M6281 Muscle weakness (generalized): Secondary | ICD-10-CM

## 2022-01-26 DIAGNOSIS — R293 Abnormal posture: Secondary | ICD-10-CM

## 2022-01-26 DIAGNOSIS — R269 Unspecified abnormalities of gait and mobility: Secondary | ICD-10-CM

## 2022-01-26 NOTE — Therapy (Signed)
OUTPATIENT PHYSICAL THERAPY TREATMENT NOTE      Patient Name: April Rogers MRN: [id number] DOB:1941/01/05, 81 y.o., female Today's Date: 01/27/2022  PCP: Emerson Monte, MD REFERRING PROVIDER: Duanne Guess, PA    PT End of Session - 01/27/22 0819     Visit Number 45    Number of Visits 19    Date for PT Re-Evaluation 02/15/22    Authorization Type [employer]    Authorization Time Period 10/21/21-01/07/22 for 20 visits    Authorization - Visit Number [id number]    Authorization - Number of Visits 20    Progress Note Due on Visit 40    PT Start Time 1434    PT Stop Time 1518    PT Time Calculation (min) 44 min    Equipment Utilized During Treatment Gait belt    Activity Tolerance Patient tolerated treatment well;No increased pain;Patient limited by fatigue    Behavior During Therapy [facility] for tasks assessed/performed                Past Medical History:  Diagnosis Date   Anxiety    Arthritis    Cancer ([place])    skin cancer   Complication of anesthesia    general anesthesia hard on memory after pt goes home   Depression    Fracture of one rib    GERD (gastroesophageal reflux disease)    was on nexium but no longer needs to take this medication   Hypertension    Macular degeneration of both eyes    Osteoporosis    Past Surgical History:  Procedure Laterality Date   ARM SKIN LESION BIOPSY / EXCISION Right    CHOLECYSTECTOMY     COLONOSCOPY WITH PROPOFOL N/A 08/03/2015   Procedure: COLONOSCOPY WITH PROPOFOL;  Surgeon: Hulen Luster, MD;  Location: [facility] ENDOSCOPY;  Service: Gastroenterology;  Laterality: N/A;   INTRAMEDULLARY (IM) NAIL INTERTROCHANTERIC Right 11/26/2015   Procedure: INTRAMEDULLARY (IM) NAIL INTERTROCHANTRIC;  Surgeon: Earnestine Leys, MD;  Location: [facility] ORS;  Service: Orthopedics;  Laterality: Right;   KYPHOPLASTY N/A 08/16/2019   Procedure: L4 KYPHOPLASTY;  Surgeon: Hessie Knows, MD;  Location: [facility] ORS;  Service: Orthopedics;  Laterality: N/A;    KYPHOPLASTY N/A 08/29/2019   Procedure: L2 KYPHOPLASTY;  Surgeon: Hessie Knows, MD;  Location: [facility] ORS;  Service: Orthopedics;  Laterality: N/A;   KYPHOPLASTY N/A 10/10/2019   Procedure: L3 KYPHOPLASTY;  Surgeon: Hessie Knows, MD;  Location: [facility] ORS;  Service: Orthopedics;  Laterality: N/A;   open heart surgery  2019   valve replacement, aortic arch  aneurysm repair   TONSILLECTOMY     TUBAL LIGATION     WRIST RECONSTRUCTION Right    Patient Active Problem List   Diagnosis Date Noted   Femur fracture, right, closed, initial encounter 11/25/2015    REFERRING DIAG: Imbalance  THERAPY DIAG:  Difficulty in walking, not elsewhere classified  Unsteadiness on feet  Muscle weakness (generalized)  Abnormality of gait and mobility  Abnormal posture  Rationale for Evaluation and Treatment Rehabilitation  PERTINENT HISTORY: Pt is a 81 y.o. female referred to PT for gait and imbalance. PMH includes: Anxiety, skin cancer, GERD, HTN, osteoporosis, L2-L4 kyphoplasties 08/29/19, R hip ORIF. Pt reporting for OPPT for gait/imbalance. Was recently receiving OP PT for R shoulder but that has been complete. Pt 's daughter present to assist in history taking. Pt's daughter reports since covid, open heart surgery, recent surgeries causing balance concerns dating from 2019. No falls in last 6  months, 1 fall in the last year. Near falls reported. Pt is a furniture walker, does use rollator in house. Pt endorsing difficulty with vision, obstacle navigation, clearing feet with walking. Home lay out noted in flow sheets. Pt's goal is to improve balance and strength.   PRECAUTIONS: fall risk  SUBJECTIVE: Pt reports doing better. She reports feeling less cloudy and feeling more like herself. She denies any pain and denies any new falls; She is excited about family coming to visit this weekend;   PAIN:  Are you having pain? No  TODAY'S TREATMENT:   Ex: Warm up on Nustep BUE/BLE level  2-4, interval training  x4 min with cues to keep steps per minute >75 x4 min;seat position 9, arm #8;  Completed 0.15 miles    Ex: Next to support bar: Standing on airex pad Red tband: -BUE low row 2x10 -BUE shoulder extension 2x10 Patient required min-moderate verbal/tactile cues for correct exercise technique. Required CGA for safety;     Standing on 1/2 bolster (Round side up): Heel raises x15 reps Toe raises x15 reps; Pt able to exhibit good ROM with minimal difficulty. Does require cues to keep trunk extended and avoid hip flexion/extension to isolate ankle ROM;    NMR:  Forward/backward step up/down airex pad unsupported x10 reps; Required CGA for safety, also required min VCS to try and not look down to challenge dynamic balance;  Standing on airex pad: Staggered stance  With pertubations x1 min- no loss of balance Feet together  With pertubations x1 min- no loss of balance;  Pt able to maintain balance against moderate perturbations in various stance positions while on compliant surface;   Gait weaving around cones #4 without AD, x4 sets with cues to increase step length and to try and avoid looking down to challenge dynamic balance. Patient initially exhibits narrow base of support and heavy look down requiring min A for safety. With increased repetition she was able to take better step with normal base of support and less unsteadiness, progressing to close supervision;  Standing on firm surface: -alternate toe taps to cone unsupported x2-3 reps with CGA for safety; patient able to exhibit good weight shift and control;    Tolerated session well. She was able to exhibit better stance control without loss of balance while on compliant surface. Patient continues to look down with most walking but this is likely due to poor vision and wanting to make sure she doesn't trip on anything;             PATIENT EDUCATION: Education details: exercise technique/positioning Person educated:  Patient Education method: Explanation, Demonstration, and Verbal cues Education comprehension: verbalized understanding, returned demonstration, verbal cues required, and needs further education   HOME EXERCISE PROGRAM: Continue as previously given;    PT Short Term Goals - 10/21/21 1359       PT SHORT TERM GOAL #1   Title Pt will be independent with HEP to improve posture and balance for ADL completion    Baseline 08/11/21: initiated; Has been doing her HEP, no questions (09/15/2021) 4/13: doing well    Time 10    Period Weeks    Status Achieved    Target Date 10/20/21              PT Long Term Goals -       PT LONG TERM GOAL #1   Title Pt will improve FOTO to target score of 54 to demonstrate clinically significant improvement in functional  mobility    Baseline 08/11/21: 43; 51 (09/15/2021), 5/25: 45% 6/28: 55%   Time 8    Period Weeks    Status Achieved   Target Date 02/15/22      PT LONG TERM GOAL #2   Title Pt will improve 5xSTS to <15 sec without UE support to demonstrate clinically significant improvement in strength and reduce fall risk.    Baseline 08/11/21: 23.08 sec with UE's on arm rests; 23 seconds with UE's on arm rests (09/15/2021), 4/13: 17.07 sec without UE assist; 5/25: 12.5 sec    Time 8    Period Weeks    Status Achieved    Target Date 02/15/22      PT LONG TERM GOAL #3   Title Pt will improve DGI to 19 to demonstrate clinicially significant improvement in falls reduction with community ambulation tasks.    Baseline 08/11/21: 11/24, 4/13: deferred due to time, 5/25: deferred; 6/28: 15/24   Time 8    Period Weeks    Status Partially Met   Target Date 02/15/22      PT LONG TERM GOAL #4   Title Pt will improve normal walking speed to > 0.8 m/s to decrease risk of falls with household walking tasks.    Baseline 08/11/21: 0.58 m/s; 0.71 m/s with rollator walker (09/15/2021), 4/13: 0.69 m/s (regular pace); 5/25: 0.84 m/s without rollator    Time 8    Period Weeks     Status Achieved    Target Date 02/15/22      PT LONG TERM GOAL #5   Title Pt will improve Berg balance by 8 points to display clinically significant improvement in reduced fall risk with ADL's    Baseline 08/18/21: 40/56; 43/56 (09/15/2021), 4/13: 49/56, 5/25: 53/56    Time 8    Period Weeks    Status Achieved    Target Date 02/15/22              Plan -      Clinical Impression Statement Patient motivated and participated well within session. She was instructed in advanced postural strengthening. Patient required min VCs for proper positioning/exercise technique. She is challenged with standing on compliant surface. However this session she exhibits better stance control with less instability being able to maintain balance against perturbations. Patient would benefit from additional skilled PT intervention to improve strength, balance and gait safety;    Personal Factors and Comorbidities Age;Time since onset of injury/illness/exacerbation;Comorbidity 3+;Past/Current Experience    Comorbidities PMH inc;ludes: Anxiety, skin cancer, GERD, HTN, osteoporosis, L2-L4 kyphoplasties 08/29/19, R hip ORIF.    Examination-Activity Limitations Stairs;Bend;Locomotion Level;Stand;Reach Overhead;Carry;Transfers;Squat    Examination-Participation Restrictions Shop;Community Activity    Stability/Clinical Decision Making Stable/Uncomplicated    Rehab Potential Fair    PT Frequency 2x / week    PT Duration 8 weeks    PT Treatment/Interventions ADLs/Self Care Home Management;DME Instruction;Gait training;Stair training;Functional mobility training;Therapeutic activities;Therapeutic exercise;Balance training;Neuromuscular re-education;Patient/family education    PT Home Exercise Plan Access Code [email or web address] with scap retractions for posture; no updates    Consulted and Agree with Plan of Care Patient               Amalie Koran, PT, DPT 01/27/2022, 8:25 AM

## 2022-01-27 ENCOUNTER — Encounter: Payer: Self-pay | Admitting: Physical Therapy

## 2022-01-31 ENCOUNTER — Encounter: Payer: Self-pay | Admitting: Physical Therapy

## 2022-01-31 ENCOUNTER — Ambulatory Visit: Payer: Medicare HMO | Admitting: Physical Therapy

## 2022-01-31 DIAGNOSIS — M6281 Muscle weakness (generalized): Secondary | ICD-10-CM

## 2022-01-31 DIAGNOSIS — R2681 Unsteadiness on feet: Secondary | ICD-10-CM | POA: Diagnosis not present

## 2022-01-31 DIAGNOSIS — R269 Unspecified abnormalities of gait and mobility: Secondary | ICD-10-CM

## 2022-01-31 DIAGNOSIS — R262 Difficulty in walking, not elsewhere classified: Secondary | ICD-10-CM | POA: Diagnosis not present

## 2022-01-31 DIAGNOSIS — R293 Abnormal posture: Secondary | ICD-10-CM

## 2022-01-31 NOTE — Therapy (Signed)
OUTPATIENT PHYSICAL THERAPY TREATMENT NOTE      Patient Name: April Rogers MRN: [id number] DOB:03/14/1941, 80 y.o., female Today's Date: 02/01/2022  PCP: Bert Klein III, MD REFERRING PROVIDER: Gaines, Thomas C, PA    PT End of Session - 01/31/22 1431     Visit Number 46    Number of Visits 53    Date for PT Re-Evaluation 02/15/22    Authorization Type [employer]    Authorization Time Period 10/21/21-01/07/22 for 20 visits    PT Start Time 1433    PT Stop Time 1515    PT Time Calculation (min) 42 min    Equipment Utilized During Treatment Gait belt    Activity Tolerance Patient tolerated treatment well;No increased pain;Patient limited by fatigue    Behavior During Therapy WFL for tasks assessed/performed                Past Medical History:  Diagnosis Date   Anxiety    Arthritis    Cancer (HCC)    skin cancer   Complication of anesthesia    general anesthesia hard on memory after pt goes home   Depression    Fracture of one rib    GERD (gastroesophageal reflux disease)    was on nexium but no longer needs to take this medication   Hypertension    Macular degeneration of both eyes    Osteoporosis    Past Surgical History:  Procedure Laterality Date   ARM SKIN LESION BIOPSY / EXCISION Right    CHOLECYSTECTOMY     COLONOSCOPY WITH PROPOFOL N/A 08/03/2015   Procedure: COLONOSCOPY WITH PROPOFOL;  Surgeon: Paul Y Oh, MD;  Location: [facility];  Service: Gastroenterology;  Laterality: N/A;   INTRAMEDULLARY (IM) NAIL INTERTROCHANTERIC Right 11/26/2015   Procedure: INTRAMEDULLARY (IM) NAIL INTERTROCHANTRIC;  Surgeon: Howard Miller, MD;  Location: [facility];  Service: Orthopedics;  Laterality: Right;   KYPHOPLASTY N/A 08/16/2019   Procedure: L4 KYPHOPLASTY;  Surgeon: Menz, Michael, MD;  Location: [facility];  Service: Orthopedics;  Laterality: N/A;   KYPHOPLASTY N/A 08/29/2019   Procedure: L2 KYPHOPLASTY;  Surgeon: Menz, Michael, MD;  Location: [facility];  Service:  Orthopedics;  Laterality: N/A;   KYPHOPLASTY N/A 10/10/2019   Procedure: L3 KYPHOPLASTY;  Surgeon: Menz, Michael, MD;  Location: [facility];  Service: Orthopedics;  Laterality: N/A;   open heart surgery  2019   valve replacement, aortic arch  aneurysm repair   TONSILLECTOMY     TUBAL LIGATION     WRIST RECONSTRUCTION Right    Patient Active Problem List   Diagnosis Date Noted   Femur fracture, right, closed, initial encounter 11/25/2015    REFERRING DIAG: Imbalance  THERAPY DIAG:  Difficulty in walking, not elsewhere classified  Unsteadiness on feet  Muscle weakness (generalized)  Abnormality of gait and mobility  Abnormal posture  Rationale for Evaluation and Treatment Rehabilitation  PERTINENT HISTORY: Pt is a 80 y.o. female referred to PT for gait and imbalance. PMH includes: Anxiety, skin cancer, GERD, HTN, osteoporosis, L2-L4 kyphoplasties 08/29/19, R hip ORIF. Pt reporting for OPPT for gait/imbalance. Was recently receiving OP PT for R shoulder but that has been complete. Pt 's daughter present to assist in history taking. Pt's daughter reports since covid, open heart surgery, recent surgeries causing balance concerns dating from 2019. No falls in last 6 months, 1 fall in the last year. Near falls reported. Pt is a furniture walker, does use rollator in house. Pt endorsing difficulty with vision, obstacle   navigation, clearing feet with walking. Home lay out noted in flow sheets. Pt's goal is to improve balance and strength.   PRECAUTIONS: fall risk  SUBJECTIVE: Pt reports doing better. She reports feeling less cloudy and feeling more like herself. She denies any pain and denies any new falls; She is excited about family coming to visit this weekend;   PAIN:  Are you having pain? No  TODAY'S TREATMENT:   Ex: Warm up on Nustep BUE/BLE level  2-4, interval training x4 min with cues to keep steps per minute >70 x4 min;seat position 9, arm #8;  Completed 0.13 miles     Ex: Standing on airex pad: 3# on BLE: -March x15 reps -hip extension SLR x12 reps   NMR:  Standing on airex pad: Alternate toe taps to 6 inch step x15 reps -standing one foot on airex one foot on 6 inch step:  Unsupported 30 sec hold  Progressed to BUE ball up/down x5 reps; Patient required CGA for safety, She reports moderate difficulty standing and raising ball overhead with increased fatigue in LE;   Feet apart:  Ball toss/catch x10 reps (caught approximately 4 times) no imbalance when didn't catch ball- required CGA for safety but was able to keep balance well;    Tolerated session well. She was able to exhibit better stance control without loss of balance while on compliant surface. Patient continues to look down with most walking but this is likely due to poor vision and wanting to make sure she doesn't trip on anything;             PATIENT EDUCATION: Education details: exercise technique/positioning Person educated: Patient Education method: Explanation, Demonstration, and Verbal cues Education comprehension: verbalized understanding, returned demonstration, verbal cues required, and needs further education   HOME EXERCISE PROGRAM: Continue as previously given;    PT Short Term Goals - 10/21/21 1359       PT SHORT TERM GOAL #1   Title Pt will be independent with HEP to improve posture and balance for ADL completion    Baseline 08/11/21: initiated; Has been doing her HEP, no questions (09/15/2021) 4/13: doing well    Time 10    Period Weeks    Status Achieved    Target Date 10/20/21              PT Long Term Goals -       PT LONG TERM GOAL #1   Title Pt will improve FOTO to target score of 54 to demonstrate clinically significant improvement in functional mobility    Baseline 08/11/21: 43; 51 (09/15/2021), 5/25: 45% 6/28: 55%   Time 8    Period Weeks    Status Achieved   Target Date 02/15/22      PT LONG TERM GOAL #2   Title Pt will improve 5xSTS to  <15 sec without UE support to demonstrate clinically significant improvement in strength and reduce fall risk.    Baseline 08/11/21: 23.08 sec with UE's on arm rests; 23 seconds with UE's on arm rests (09/15/2021), 4/13: 17.07 sec without UE assist; 5/25: 12.5 sec    Time 8    Period Weeks    Status Achieved    Target Date 02/15/22      PT LONG TERM GOAL #3   Title Pt will improve DGI to 19 to demonstrate clinicially significant improvement in falls reduction with community ambulation tasks.    Baseline 08/11/21: 11/24, 4/13: deferred due to time, 5/25: deferred; 6/28: 15/24     Time 8    Period Weeks    Status Partially Met   Target Date 02/15/22      PT LONG TERM GOAL #4   Title Pt will improve normal walking speed to > 0.8 m/s to decrease risk of falls with household walking tasks.    Baseline 08/11/21: 0.58 m/s; 0.71 m/s with rollator walker (09/15/2021), 4/13: 0.69 m/s (regular pace); 5/25: 0.84 m/s without rollator    Time 8    Period Weeks    Status Achieved    Target Date 02/15/22      PT LONG TERM GOAL #5   Title Pt will improve Berg balance by 8 points to display clinically significant improvement in reduced fall risk with ADL's    Baseline 08/18/21: 40/56; 43/56 (09/15/2021), 4/13: 49/56, 5/25: 53/56    Time 8    Period Weeks    Status Achieved    Target Date 02/15/22              Plan -      Clinical Impression Statement Patient motivated and participated well within session. Patient required min VCs for proper positioning/exercise technique. She is challenged with standing on compliant surface. However this session she exhibits better stance control with less instability being able to maintain balance unsupported when throwing/catching ball. She did require CGA to close supervision for safety;  Patient would benefit from additional skilled PT intervention to improve strength, balance and gait safety;    Personal Factors and Comorbidities Age;Time since onset of  injury/illness/exacerbation;Comorbidity 3+;Past/Current Experience    Comorbidities PMH inc;ludes: Anxiety, skin cancer, GERD, HTN, osteoporosis, L2-L4 kyphoplasties 08/29/19, R hip ORIF.    Examination-Activity Limitations Stairs;Bend;Locomotion Level;Stand;Reach Overhead;Carry;Transfers;Squat    Examination-Participation Restrictions Shop;Community Activity    Stability/Clinical Decision Making Stable/Uncomplicated    Rehab Potential Fair    PT Frequency 2x / week    PT Duration 8 weeks    PT Treatment/Interventions ADLs/Self Care Home Management;DME Instruction;Gait training;Stair training;Functional mobility training;Therapeutic activities;Therapeutic exercise;Balance training;Neuromuscular re-education;Patient/family education    PT Home Exercise Plan Access Code [email or web address] with scap retractions for posture; no updates    Consulted and Agree with Plan of Care Patient               Raya Mckinstry, PT, DPT 02/01/2022, 9:04 AM

## 2022-02-02 ENCOUNTER — Ambulatory Visit: Payer: Medicare HMO | Admitting: Physical Therapy

## 2022-02-02 ENCOUNTER — Encounter: Payer: Self-pay | Admitting: Physical Therapy

## 2022-02-02 DIAGNOSIS — R269 Unspecified abnormalities of gait and mobility: Secondary | ICD-10-CM

## 2022-02-02 DIAGNOSIS — R293 Abnormal posture: Secondary | ICD-10-CM | POA: Diagnosis not present

## 2022-02-02 DIAGNOSIS — R262 Difficulty in walking, not elsewhere classified: Secondary | ICD-10-CM | POA: Diagnosis not present

## 2022-02-02 DIAGNOSIS — M6281 Muscle weakness (generalized): Secondary | ICD-10-CM | POA: Diagnosis not present

## 2022-02-02 DIAGNOSIS — R2681 Unsteadiness on feet: Secondary | ICD-10-CM | POA: Diagnosis not present

## 2022-02-02 NOTE — Therapy (Signed)
OUTPATIENT PHYSICAL THERAPY TREATMENT NOTE      Patient Name: April Rogers MRN: [id number] DOB:01-Sep-1940, 81 y.o., female Today's Date: 02/02/2022  PCP: Emerson Monte, MD REFERRING PROVIDER: Duanne Guess, PA    PT End of Session - 02/02/22 1437     Visit Number 47    Number of Visits 69    Date for PT Re-Evaluation 02/15/22    Authorization Type [employer]    Authorization Time Period 10/21/21-01/07/22 for 20 visits    PT Start Time 7588    PT Stop Time 1515    PT Time Calculation (min) 41 min    Equipment Utilized During Treatment Gait belt    Activity Tolerance Patient tolerated treatment well;No increased pain;Patient limited by fatigue    Behavior During Therapy [facility] for tasks assessed/performed                Past Medical History:  Diagnosis Date   Anxiety    Arthritis    Cancer ([place])    skin cancer   Complication of anesthesia    general anesthesia hard on memory after pt goes home   Depression    Fracture of one rib    GERD (gastroesophageal reflux disease)    was on nexium but no longer needs to take this medication   Hypertension    Macular degeneration of both eyes    Osteoporosis    Past Surgical History:  Procedure Laterality Date   ARM SKIN LESION BIOPSY / EXCISION Right    CHOLECYSTECTOMY     COLONOSCOPY WITH PROPOFOL N/A 08/03/2015   Procedure: COLONOSCOPY WITH PROPOFOL;  Surgeon: Hulen Luster, MD;  Location: [facility];  Service: Gastroenterology;  Laterality: N/A;   INTRAMEDULLARY (IM) NAIL INTERTROCHANTERIC Right 11/26/2015   Procedure: INTRAMEDULLARY (IM) NAIL INTERTROCHANTRIC;  Surgeon: Earnestine Leys, MD;  Location: [facility];  Service: Orthopedics;  Laterality: Right;   KYPHOPLASTY N/A 08/16/2019   Procedure: L4 KYPHOPLASTY;  Surgeon: Hessie Knows, MD;  Location: [facility];  Service: Orthopedics;  Laterality: N/A;   KYPHOPLASTY N/A 08/29/2019   Procedure: L2 KYPHOPLASTY;  Surgeon: Hessie Knows, MD;  Location: [facility];  Service:  Orthopedics;  Laterality: N/A;   KYPHOPLASTY N/A 10/10/2019   Procedure: L3 KYPHOPLASTY;  Surgeon: Hessie Knows, MD;  Location: [facility];  Service: Orthopedics;  Laterality: N/A;   open heart surgery  2019   valve replacement, aortic arch  aneurysm repair   TONSILLECTOMY     TUBAL LIGATION     WRIST RECONSTRUCTION Right    Patient Active Problem List   Diagnosis Date Noted   Femur fracture, right, closed, initial encounter 11/25/2015    REFERRING DIAG: Imbalance  THERAPY DIAG:  Difficulty in walking, not elsewhere classified  Unsteadiness on feet  Muscle weakness (generalized)  Abnormality of gait and mobility  Abnormal posture  Rationale for Evaluation and Treatment Rehabilitation  PERTINENT HISTORY: Pt is a 80 y.o. female referred to PT for gait and imbalance. PMH includes: Anxiety, skin cancer, GERD, HTN, osteoporosis, L2-L4 kyphoplasties 08/29/19, R hip ORIF. Pt reporting for OPPT for gait/imbalance. Was recently receiving OP PT for R shoulder but that has been complete. Pt 's daughter present to assist in history taking. Pt's daughter reports since covid, open heart surgery, recent surgeries causing balance concerns dating from 2019. No falls in last 6 months, 1 fall in the last year. Near falls reported. Pt is a furniture walker, does use rollator in house. Pt endorsing difficulty with vision, obstacle  navigation, clearing feet with walking. Home lay out noted in flow sheets. Pt's goal is to improve balance and strength.   PRECAUTIONS: fall risk  SUBJECTIVE: Pt reports doing better. She reports feeling less fatigued today. She denies any soreness. No new falls.   PAIN:  Are you having pain? No  TODAY'S TREATMENT:   Ex: Warm up on Nustep BUE/BLE level  2-4, interval training x4 min with cues to keep steps per minute >70 x4 min;seat position 9, arm #8;  Completed 0.13 miles    Ex: Standing on airex pad: 3# on BLE: -March x1 min unsupported with CGA for safety, good  control, does require cues to step back to pad to avoid marching off airex pad;  -hip extension SLR x15 reps   Forward/backward walking in parallel bars unsupported 3# ankle weight, x5 laps each direction  BP after exercise: 177/82, HR 66 Pt reports moderate challenge and mild shortness of breath with advanced exercise;  NMR:  Resisted walking 7.5# forward/backward x2 laps, side stepping x2 laps Required CGA to min A for safety with min VCs for weight shift for balance control.   Standing on incline: Unsupported 30 sec hold - no loss of balance, good stability  Forward/backward rockerboard feet apart, x1 min with intermittent rail assist; Pt able to exhibit good weight shift with good ankle control;     Tolerated session well. She was able to progress dynamic balance with resisted walking. She also exhibits better postural correction this session with less cues;              PATIENT EDUCATION: Education details: exercise technique/positioning Person educated: Patient Education method: Explanation, Demonstration, and Verbal cues Education comprehension: verbalized understanding, returned demonstration, verbal cues required, and needs further education   HOME EXERCISE PROGRAM: Continue as previously given;    PT Short Term Goals - 10/21/21 1359       PT SHORT TERM GOAL #1   Title Pt will be independent with HEP to improve posture and balance for ADL completion    Baseline 08/11/21: initiated; Has been doing her HEP, no questions (09/15/2021) 4/13: doing well    Time 10    Period Weeks    Status Achieved    Target Date 10/20/21              PT Long Term Goals -       PT LONG TERM GOAL #1   Title Pt will improve FOTO to target score of 54 to demonstrate clinically significant improvement in functional mobility    Baseline 08/11/21: 43; 51 (09/15/2021), 5/25: 45% 6/28: 55%   Time 8    Period Weeks    Status Achieved   Target Date 02/15/22      PT LONG TERM GOAL  #2   Title Pt will improve 5xSTS to <15 sec without UE support to demonstrate clinically significant improvement in strength and reduce fall risk.    Baseline 08/11/21: 23.08 sec with UE's on arm rests; 23 seconds with UE's on arm rests (09/15/2021), 4/13: 17.07 sec without UE assist; 5/25: 12.5 sec    Time 8    Period Weeks    Status Achieved    Target Date 02/15/22      PT LONG TERM GOAL #3   Title Pt will improve DGI to 19 to demonstrate clinicially significant improvement in falls reduction with community ambulation tasks.    Baseline 08/11/21: 11/24, 4/13: deferred due to time, 5/25: deferred; 6/28: 15/24  Time 8    Period Weeks    Status Partially Met   Target Date 02/15/22      PT LONG TERM GOAL #4   Title Pt will improve normal walking speed to > 0.8 m/s to decrease risk of falls with household walking tasks.    Baseline 08/11/21: 0.58 m/s; 0.71 m/s with rollator walker (09/15/2021), 4/13: 0.69 m/s (regular pace); 5/25: 0.84 m/s without rollator    Time 8    Period Weeks    Status Achieved    Target Date 02/15/22      PT LONG TERM GOAL #5   Title Pt will improve Berg balance by 8 points to display clinically significant improvement in reduced fall risk with ADL's    Baseline 08/18/21: 40/56; 43/56 (09/15/2021), 4/13: 49/56, 5/25: 53/56    Time 8    Period Weeks    Status Achieved    Target Date 02/15/22              Plan -      Clinical Impression Statement Patient motivated and participated well within session. Patient required min VCs for proper positioning/exercise technique. Progressed dynamic balance with resisted walking. Patient tolerated well without pain or discomfort. She was able to exhibit better postural correction this session with less cues. Patient did get fatigued with resisted exercise, assessed BP and provided short seated rest break;Patient would benefit from additional skilled PT intervention to improve strength, balance and gait safety;    Personal Factors  and Comorbidities Age;Time since onset of injury/illness/exacerbation;Comorbidity 3+;Past/Current Experience    Comorbidities PMH inc;ludes: Anxiety, skin cancer, GERD, HTN, osteoporosis, L2-L4 kyphoplasties 08/29/19, R hip ORIF.    Examination-Activity Limitations Stairs;Bend;Locomotion Level;Stand;Reach Overhead;Carry;Transfers;Squat    Examination-Participation Restrictions Shop;Community Activity    Stability/Clinical Decision Making Stable/Uncomplicated    Rehab Potential Fair    PT Frequency 2x / week    PT Duration 8 weeks    PT Treatment/Interventions ADLs/Self Care Home Management;DME Instruction;Gait training;Stair training;Functional mobility training;Therapeutic activities;Therapeutic exercise;Balance training;Neuromuscular re-education;Patient/family education    PT Home Exercise Plan Access Code [email or web address] with scap retractions for posture; no updates    Consulted and Agree with Plan of Care Patient               Neylan Koroma, PT, DPT 02/02/2022, 2:38 PM

## 2022-02-04 ENCOUNTER — Encounter: Payer: Self-pay | Admitting: Physical Therapy

## 2022-02-07 ENCOUNTER — Encounter: Payer: Self-pay | Admitting: Physical Therapy

## 2022-02-07 ENCOUNTER — Ambulatory Visit: Payer: Medicare HMO | Admitting: Physical Therapy

## 2022-02-07 DIAGNOSIS — R2681 Unsteadiness on feet: Secondary | ICD-10-CM | POA: Diagnosis not present

## 2022-02-07 DIAGNOSIS — R269 Unspecified abnormalities of gait and mobility: Secondary | ICD-10-CM

## 2022-02-07 DIAGNOSIS — R262 Difficulty in walking, not elsewhere classified: Secondary | ICD-10-CM

## 2022-02-07 DIAGNOSIS — M6281 Muscle weakness (generalized): Secondary | ICD-10-CM

## 2022-02-07 DIAGNOSIS — R293 Abnormal posture: Secondary | ICD-10-CM

## 2022-02-07 NOTE — Therapy (Signed)
OUTPATIENT PHYSICAL THERAPY TREATMENT NOTE      Patient Name: April Rogers MRN: [id number] DOB:06/12/1941, 81 y.o., female Today's Date: 02/07/2022  PCP: Bert Klein III, MD REFERRING PROVIDER: Gaines, Thomas C, PA    PT End of Session - 02/07/22 1434     Visit Number 48    Number of Visits 53    Date for PT Re-Evaluation 02/15/22    Authorization Type [employer]    Authorization Time Period 10/21/21-01/07/22 for 20 visits    PT Start Time 1434    PT Stop Time 1515    PT Time Calculation (min) 41 min    Equipment Utilized During Treatment Gait belt    Activity Tolerance Patient tolerated treatment well;No increased pain;Patient limited by fatigue    Behavior During Therapy WFL for tasks assessed/performed                Past Medical History:  Diagnosis Date   Anxiety    Arthritis    Cancer (HCC)    skin cancer   Complication of anesthesia    general anesthesia hard on memory after pt goes home   Depression    Fracture of one rib    GERD (gastroesophageal reflux disease)    was on nexium but no longer needs to take this medication   Hypertension    Macular degeneration of both eyes    Osteoporosis    Past Surgical History:  Procedure Laterality Date   ARM SKIN LESION BIOPSY / EXCISION Right    CHOLECYSTECTOMY     COLONOSCOPY WITH PROPOFOL N/A 08/03/2015   Procedure: COLONOSCOPY WITH PROPOFOL;  Surgeon: Paul Y Oh, MD;  Location: [facility];  Service: Gastroenterology;  Laterality: N/A;   INTRAMEDULLARY (IM) NAIL INTERTROCHANTERIC Right 11/26/2015   Procedure: INTRAMEDULLARY (IM) NAIL INTERTROCHANTRIC;  Surgeon: Howard Miller, MD;  Location: [facility];  Service: Orthopedics;  Laterality: Right;   KYPHOPLASTY N/A 08/16/2019   Procedure: L4 KYPHOPLASTY;  Surgeon: Menz, Michael, MD;  Location: [facility];  Service: Orthopedics;  Laterality: N/A;   KYPHOPLASTY N/A 08/29/2019   Procedure: L2 KYPHOPLASTY;  Surgeon: Menz, Michael, MD;  Location: [facility];  Service:  Orthopedics;  Laterality: N/A;   KYPHOPLASTY N/A 10/10/2019   Procedure: L3 KYPHOPLASTY;  Surgeon: Menz, Michael, MD;  Location: [facility];  Service: Orthopedics;  Laterality: N/A;   open heart surgery  2019   valve replacement, aortic arch  aneurysm repair   TONSILLECTOMY     TUBAL LIGATION     WRIST RECONSTRUCTION Right    Patient Active Problem List   Diagnosis Date Noted   Femur fracture, right, closed, initial encounter 11/25/2015    REFERRING DIAG: Imbalance  THERAPY DIAG:  Difficulty in walking, not elsewhere classified  Unsteadiness on feet  Muscle weakness (generalized)  Abnormality of gait and mobility  Abnormal posture  Rationale for Evaluation and Treatment Rehabilitation  PERTINENT HISTORY: Pt is a 81 y.o. female referred to PT for gait and imbalance. PMH includes: Anxiety, skin cancer, GERD, HTN, osteoporosis, L2-L4 kyphoplasties 08/29/19, R hip ORIF. Pt reporting for OPPT for gait/imbalance. Was recently receiving OP PT for R shoulder but that has been complete. Pt 's daughter present to assist in history taking. Pt's daughter reports since covid, open heart surgery, recent surgeries causing balance concerns dating from 2019. No falls in last 6 months, 1 fall in the last year. Near falls reported. Pt is a furniture walker, does use rollator in house. Pt endorsing difficulty with vision, obstacle   navigation, clearing feet with walking. Home lay out noted in flow sheets. Pt's goal is to improve balance and strength.   PRECAUTIONS: fall risk  SUBJECTIVE: Pt reports doing well. She was able to see [place] [employer] this weekend and had a good time at the concert. No pain after last session. Reports minimal fatigue today;    PAIN:  Are you having pain? No  TODAY'S TREATMENT:   Ex: Warm up on Nustep BUE/BLE level  2-4, interval training x4 min with cues to keep steps per minute >70 x4 min;seat position 9, arm #8;  Completed 0.17 miles    Ex: Standing on 1/2 bolster (round  side up): -heel raises x15  -toe raises x15  Advanced HEP with single leg heel/toe raises x10 reps each LE with BUE rail assist;  NMR: Tandem stance on airex beam:  Unsupported 10 sec hold x4 reps each foot in front  Progressed to unsupported with head turns side/side #3 turns, each foot in front x2 reps each foot in front;  Standing on airex beam: Feet apart:  BUE shoulder extension x10 reps  BUE low row x10 reps Pt required min A for safety with cues for neutral weight shift, often leaning backwards with challenge for stance control with unsupported standing; She was able to exhibit good postural control with standing;  Feet apart:  Balloon taps each UE x2 min x2 sets with good reaction strategies. Pt does require min A for safety intermittently. She was able to exhibit good reaction with hip/knee and ankle strategies when reaching outside base of support; She exhibits most unsteadiness with forward lean;    Tolerated session well. She was able to progress dynamic balance with reaching outside base of support on airex beam. She also exhibits better postural correction this session with less cues;    PATIENT EDUCATION: Education details: exercise technique/positioning Person educated: Patient Education method: Explanation, Demonstration, and Verbal cues Education comprehension: verbalized understanding, returned demonstration, verbal cues required, and needs further education   HOME EXERCISE PROGRAM: Added single leg heel raises- will print next session;     PT Short Term Goals - 10/21/21 1359       PT SHORT TERM GOAL #1   Title Pt will be independent with HEP to improve posture and balance for ADL completion    Baseline 08/11/21: initiated; Has been doing her HEP, no questions (09/15/2021) 4/13: doing well    Time 10    Period Weeks    Status Achieved    Target Date 10/20/21              PT Long Term Goals -       PT LONG TERM GOAL #1   Title Pt will improve FOTO  to target score of 54 to demonstrate clinically significant improvement in functional mobility    Baseline 08/11/21: 43; 51 (09/15/2021), 5/25: 45% 6/28: 55%   Time 8    Period Weeks    Status Achieved   Target Date 02/15/22      PT LONG TERM GOAL #2   Title Pt will improve 5xSTS to <15 sec without UE support to demonstrate clinically significant improvement in strength and reduce fall risk.    Baseline 08/11/21: 23.08 sec with UE's on arm rests; 23 seconds with UE's on arm rests (09/15/2021), 4/13: 17.07 sec without UE assist; 5/25: 12.5 sec    Time 8    Period Weeks    Status Achieved    Target Date 02/15/22  PT LONG TERM GOAL #3   Title Pt will improve DGI to 19 to demonstrate clinicially significant improvement in falls reduction with community ambulation tasks.    Baseline 08/11/21: 11/24, 4/13: deferred due to time, 5/25: deferred; 6/28: 15/24   Time 8    Period Weeks    Status Partially Met   Target Date 02/15/22      PT LONG TERM GOAL #4   Title Pt will improve normal walking speed to > 0.8 m/s to decrease risk of falls with household walking tasks.    Baseline 08/11/21: 0.58 m/s; 0.71 m/s with rollator walker (09/15/2021), 4/13: 0.69 m/s (regular pace); 5/25: 0.84 m/s without rollator    Time 8    Period Weeks    Status Achieved    Target Date 02/15/22      PT LONG TERM GOAL #5   Title Pt will improve Berg balance by 8 points to display clinically significant improvement in reduced fall risk with ADL's    Baseline 08/18/21: 40/56; 43/56 (09/15/2021), 4/13: 49/56, 5/25: 53/56    Time 8    Period Weeks    Status Achieved    Target Date 02/15/22              Plan -      Clinical Impression Statement Patient motivated and participated well within session. Patient required min VCs for proper positioning/exercise technique. Progressed dynamic balance with standing on airex beam unsupported, reaching outside base of support. . She was able to exhibit better postural correction this  session with less cues. She was challenged with narrow base of support but was able to exhibit good hip/knee and ankle strategies. CGA to min A throughout session intermittently. She is progressing well with balance, being able to reduce rail assist; Patient would benefit from additional skilled PT intervention to improve strength, balance and gait safety;    Personal Factors and Comorbidities Age;Time since onset of injury/illness/exacerbation;Comorbidity 3+;Past/Current Experience    Comorbidities PMH inc;ludes: Anxiety, skin cancer, GERD, HTN, osteoporosis, L2-L4 kyphoplasties 08/29/19, R hip ORIF.    Examination-Activity Limitations Stairs;Bend;Locomotion Level;Stand;Reach Overhead;Carry;Transfers;Squat    Examination-Participation Restrictions Shop;Community Activity    Stability/Clinical Decision Making Stable/Uncomplicated    Rehab Potential Fair    PT Frequency 2x / week    PT Duration 8 weeks    PT Treatment/Interventions ADLs/Self Care Home Management;DME Instruction;Gait training;Stair training;Functional mobility training;Therapeutic activities;Therapeutic exercise;Balance training;Neuromuscular re-education;Patient/family education    PT Home Exercise Plan Access Code [email or web address] with scap retractions for posture; no updates    Consulted and Agree with Plan of Care Patient               ,, PT, DPT 02/07/2022, 3:31 PM    

## 2022-02-09 ENCOUNTER — Encounter: Payer: Self-pay | Admitting: Physical Therapy

## 2022-02-09 ENCOUNTER — Ambulatory Visit: Payer: Medicare HMO | Attending: Orthopedic Surgery | Admitting: Physical Therapy

## 2022-02-09 DIAGNOSIS — R262 Difficulty in walking, not elsewhere classified: Secondary | ICD-10-CM | POA: Diagnosis not present

## 2022-02-09 DIAGNOSIS — R269 Unspecified abnormalities of gait and mobility: Secondary | ICD-10-CM | POA: Diagnosis not present

## 2022-02-09 DIAGNOSIS — R2681 Unsteadiness on feet: Secondary | ICD-10-CM | POA: Insufficient documentation

## 2022-02-09 DIAGNOSIS — M6281 Muscle weakness (generalized): Secondary | ICD-10-CM | POA: Diagnosis not present

## 2022-02-09 DIAGNOSIS — R293 Abnormal posture: Secondary | ICD-10-CM | POA: Diagnosis not present

## 2022-02-09 NOTE — Therapy (Signed)
OUTPATIENT PHYSICAL THERAPY TREATMENT NOTE/Discharge Summary Physical Therapy Progress Note   Dates of reporting period  01/10/22   to   02/09/22    Patient Name: April Rogers MRN: [id number] DOB:Mar 22, 1941, 81 y.o., female Today's Date: 02/09/2022  PCP: Emerson Monte, MD REFERRING PROVIDER: Duanne Guess, [place]    PT End of Session - 02/09/22 1429     Visit Number 71    Number of Visits 44    Date for PT Re-Evaluation 02/15/22    Authorization Type [employer]    Authorization Time Period 10/21/21-01/07/22 for 20 visits    PT Start Time 4782    PT Stop Time 1515    PT Time Calculation (min) 44 min    Equipment Utilized During Treatment Gait belt    Activity Tolerance Patient tolerated treatment well;No increased pain;Patient limited by fatigue    Behavior During Therapy [facility] for tasks assessed/performed             Past Medical History:  Diagnosis Date   Anxiety    Arthritis    Cancer ([place])    skin cancer   Complication of anesthesia    general anesthesia hard on memory after pt goes home   Depression    Fracture of one rib    GERD (gastroesophageal reflux disease)    was on nexium but no longer needs to take this medication   Hypertension    Macular degeneration of both eyes    Osteoporosis    Past Surgical History:  Procedure Laterality Date   ARM SKIN LESION BIOPSY / EXCISION Right    CHOLECYSTECTOMY     COLONOSCOPY WITH PROPOFOL N/A 08/03/2015   Procedure: COLONOSCOPY WITH PROPOFOL;  Surgeon: Hulen Luster, MD;  Location: [facility];  Service: Gastroenterology;  Laterality: N/A;   INTRAMEDULLARY (IM) NAIL INTERTROCHANTERIC Right 11/26/2015   Procedure: INTRAMEDULLARY (IM) NAIL INTERTROCHANTRIC;  Surgeon: Earnestine Leys, MD;  Location: [facility];  Service: Orthopedics;  Laterality: Right;   KYPHOPLASTY N/A 08/16/2019   Procedure: L4 KYPHOPLASTY;  Surgeon: Hessie Knows, MD;  Location: [facility];  Service: Orthopedics;  Laterality: N/A;   KYPHOPLASTY N/A  08/29/2019   Procedure: L2 KYPHOPLASTY;  Surgeon: Hessie Knows, MD;  Location: [facility];  Service: Orthopedics;  Laterality: N/A;   KYPHOPLASTY N/A 10/10/2019   Procedure: L3 KYPHOPLASTY;  Surgeon: Hessie Knows, MD;  Location: [facility];  Service: Orthopedics;  Laterality: N/A;   open heart surgery  2019   valve replacement, aortic arch  aneurysm repair   TONSILLECTOMY     TUBAL LIGATION     WRIST RECONSTRUCTION Right    Patient Active Problem List   Diagnosis Date Noted   Femur fracture, right, closed, initial encounter 11/25/2015    REFERRING DIAG: Imbalance  THERAPY DIAG:  Difficulty in walking, not elsewhere classified  Unsteadiness on feet  Muscle weakness (generalized)  Abnormality of gait and mobility  Abnormal posture  Rationale for Evaluation and Treatment Rehabilitation  PERTINENT HISTORY: Pt is a 81 y.o. female referred to PT for gait and imbalance. PMH includes: Anxiety, skin cancer, GERD, HTN, osteoporosis, L2-L4 kyphoplasties 08/29/19, R hip ORIF. Pt reporting for OPPT for gait/imbalance. Was recently receiving OP PT for R shoulder but that has been complete. Pt 's daughter present to assist in history taking. Pt's daughter reports since covid, open heart surgery, recent surgeries causing balance concerns dating from 2019. No falls in last 6 months, 1 fall in the last year. Near falls reported. Pt is  a furniture walker, does use rollator in house. Pt endorsing difficulty with vision, obstacle navigation, clearing feet with walking. Home lay out noted in flow sheets. Pt's goal is to improve balance and strength.   PRECAUTIONS: fall risk  SUBJECTIVE: Pt reports doing well. She reports feeling energized today. No pain and no new complaints;   PAIN:  Are you having pain? No     TODAY'S TREATMENT:  TREATMENT: PT instructed patient in outcome measures, see below:   [facility] PT Assessment - 02/10/22 0001       Observation/Other Assessments   Focus on Therapeutic  Outcomes (FOTO)  54%      6 Minute Walk- Baseline   BP (mmHg) 155/56    HR (bpm) 61      6 Minute walk- Post Test   BP (mmHg) 178/70    Modified Borg Scale for Dyspnea 1- Very mild shortness of breath      6 minute walk test results    Aerobic Endurance Distance Walked 840    Endurance additional comments with rollator, improved from 790 on 01/05/22, limited community ambulator      Dynamic Gait Index   Level Surface Normal    Change in Gait Speed Mild Impairment    Gait with Horizontal Head Turns Normal    Gait with Vertical Head Turns Mild Impairment    Gait and Pivot Turn Mild Impairment    Step Over Obstacle Mild Impairment    Step Around Obstacles Mild Impairment    Steps Mild Impairment    Total Score 18    DGI comment: improved from 15/24 on 01/05/22                 Tolerated session well. Patient fatigued, but was able to tolerate increased gait/walking with better BP response during 6 min walk test.               PATIENT EDUCATION: Education details: recommendations/plan of care Person educated: Patient Education method: Explanation, Demonstration, and Verbal cues Education comprehension: verbalized understanding, returned demonstration, verbal cues required, and needs further education   HOME EXERCISE PROGRAM: Continue as previously given;    PT Short Term Goals -        PT SHORT TERM GOAL #1   Title Pt will be independent with HEP to improve posture and balance for ADL completion    Baseline 08/11/21: initiated; Has been doing her HEP, no questions (09/15/2021) 4/13: doing well    Time 10    Period Weeks    Status Achieved    Target Date 10/20/21              PT Long Term Goals -       PT LONG TERM GOAL #1   Title Pt will improve FOTO to target score of 54 to demonstrate clinically significant improvement in functional mobility    Baseline 08/11/21: 43; 51 (09/15/2021), 5/25: 45% 6/28: 55% 8/2: 54%   Time 8    Period Weeks    Status  Achieved   Target Date 02/15/22      PT LONG TERM GOAL #2   Title Pt will improve 5xSTS to <15 sec without UE support to demonstrate clinically significant improvement in strength and reduce fall risk.    Baseline 08/11/21: 23.08 sec with UE's on arm rests; 23 seconds with UE's on arm rests (09/15/2021), 4/13: 17.07 sec without UE assist; 5/25: 12.5 sec    Time 8    Period Weeks  Status Achieved    Target Date 02/15/22      PT LONG TERM GOAL #3   Title Pt will improve DGI to 19 to demonstrate clinicially significant improvement in falls reduction with community ambulation tasks.    Baseline 08/11/21: 11/24, 4/13: deferred due to time, 5/25: deferred; 6/28: 15/24, 8/2: 18/24   Time 8    Period Weeks    Status Partially Met   Target Date 02/15/22      PT LONG TERM GOAL #4   Title Pt will improve normal walking speed to > 0.8 m/s to decrease risk of falls with household walking tasks.    Baseline 08/11/21: 0.58 m/s; 0.71 m/s with rollator walker (09/15/2021), 4/13: 0.69 m/s (regular pace); 5/25: 0.84 m/s without rollator    Time 8    Period Weeks    Status Achieved    Target Date 02/15/22      PT LONG TERM GOAL #5   Title Pt will improve Berg balance by 8 points to display clinically significant improvement in reduced fall risk with ADL's    Baseline 08/18/21: 40/56; 43/56 (09/15/2021), 4/13: 49/56, 5/25: 53/56    Time 8    Period Weeks    Status Achieved    Target Date 02/15/22              Plan -      Clinical Impression Statement Patient motivated and participated well within session. She was instructed in outcome measure to address progress towards goals. Patient exhibits significant improvement in 6 min walk distance. She continues to have elevated BP, however exhibits less jump in BP indicating improved cardiovascular conditioning. She also exhibits improvement in dynamic gait index. Pt has met most goals. She is independent in HEP and has good support from family. Recommend  discharge from PT at this time. Educated patient that if she notices a change in mobility she can always return for re-assessment.    Personal Factors and Comorbidities Age;Time since onset of injury/illness/exacerbation;Comorbidity 3+;Past/Current Experience    Comorbidities PMH inc;ludes: Anxiety, skin cancer, GERD, HTN, osteoporosis, L2-L4 kyphoplasties 08/29/19, R hip ORIF.    Examination-Activity Limitations Stairs;Bend;Locomotion Level;Stand;Reach Overhead;Carry;Transfers;Squat    Examination-Participation Restrictions Shop;Community Activity    Stability/Clinical Decision Making Stable/Uncomplicated    Rehab Potential Fair    PT Frequency 2x / week    PT Duration 8 weeks    PT Treatment/Interventions ADLs/Self Care Home Management;DME Instruction;Gait training;Stair training;Functional mobility training;Therapeutic activities;Therapeutic exercise;Balance training;Neuromuscular re-education;Patient/family education    PT Home Exercise Plan Access Code [email or web address] with scap retractions for posture; no updates    Consulted and Agree with Plan of Care Patient               Morghan Kester, PT, DPT 02/09/2022, 2:31 PM

## 2022-02-14 ENCOUNTER — Ambulatory Visit: Payer: Medicare HMO | Admitting: Physical Therapy

## 2022-02-16 ENCOUNTER — Ambulatory Visit: Payer: Medicare HMO | Admitting: Physical Therapy

## 2022-02-21 ENCOUNTER — Ambulatory Visit: Payer: Medicare HMO | Admitting: Physical Therapy

## 2022-02-23 ENCOUNTER — Ambulatory Visit: Payer: Medicare HMO | Admitting: Physical Therapy

## 2022-02-28 ENCOUNTER — Ambulatory Visit: Payer: Medicare HMO | Admitting: Physical Therapy

## 2022-03-02 ENCOUNTER — Ambulatory Visit: Payer: Medicare HMO | Admitting: Physical Therapy

## 2022-03-28 DIAGNOSIS — E785 Hyperlipidemia, unspecified: Secondary | ICD-10-CM | POA: Diagnosis not present

## 2022-03-28 DIAGNOSIS — D696 Thrombocytopenia, unspecified: Secondary | ICD-10-CM | POA: Diagnosis not present

## 2022-03-28 DIAGNOSIS — I10 Essential (primary) hypertension: Secondary | ICD-10-CM | POA: Diagnosis not present

## 2022-04-04 DIAGNOSIS — K219 Gastro-esophageal reflux disease without esophagitis: Secondary | ICD-10-CM | POA: Diagnosis not present

## 2022-04-04 DIAGNOSIS — D696 Thrombocytopenia, unspecified: Secondary | ICD-10-CM | POA: Diagnosis not present

## 2022-04-04 DIAGNOSIS — I5020 Unspecified systolic (congestive) heart failure: Secondary | ICD-10-CM | POA: Diagnosis not present

## 2022-04-04 DIAGNOSIS — Z9889 Other specified postprocedural states: Secondary | ICD-10-CM | POA: Diagnosis not present

## 2022-04-04 DIAGNOSIS — M810 Age-related osteoporosis without current pathological fracture: Secondary | ICD-10-CM | POA: Diagnosis not present

## 2022-04-04 DIAGNOSIS — Z23 Encounter for immunization: Secondary | ICD-10-CM | POA: Diagnosis not present

## 2022-04-04 DIAGNOSIS — I110 Hypertensive heart disease with heart failure: Secondary | ICD-10-CM | POA: Diagnosis not present

## 2022-04-04 DIAGNOSIS — R739 Hyperglycemia, unspecified: Secondary | ICD-10-CM | POA: Diagnosis not present

## 2022-04-04 DIAGNOSIS — K52832 Lymphocytic colitis: Secondary | ICD-10-CM | POA: Diagnosis not present

## 2022-04-04 DIAGNOSIS — I700 Atherosclerosis of aorta: Secondary | ICD-10-CM | POA: Diagnosis not present

## 2022-04-25 ENCOUNTER — Ambulatory Visit: Payer: Medicare HMO | Attending: Internal Medicine

## 2022-04-25 DIAGNOSIS — R293 Abnormal posture: Secondary | ICD-10-CM | POA: Insufficient documentation

## 2022-04-25 DIAGNOSIS — M6281 Muscle weakness (generalized): Secondary | ICD-10-CM | POA: Insufficient documentation

## 2022-04-25 DIAGNOSIS — R269 Unspecified abnormalities of gait and mobility: Secondary | ICD-10-CM | POA: Diagnosis not present

## 2022-04-25 DIAGNOSIS — R262 Difficulty in walking, not elsewhere classified: Secondary | ICD-10-CM | POA: Insufficient documentation

## 2022-04-25 DIAGNOSIS — R2681 Unsteadiness on feet: Secondary | ICD-10-CM | POA: Diagnosis not present

## 2022-04-25 NOTE — Therapy (Signed)
OUTPATIENT PHYSICAL THERAPY NEURO EVALUATION   Patient Name: April Rogers MRN: [id number] DOB:[birth year], 81 y.o., female Today's Date: 04/25/2022   PCP: Adin Hector, MD REFERRING PROVIDER: Adin Hector, MD   PT End of Session - 04/25/22 1455     Visit Number 1    Date for PT Re-Evaluation 07/18/22    Authorization Type [employer]    Authorization Time Period 04/25/22-07/18/22    PT Start Time 1453    PT Stop Time 1547    PT Time Calculation (min) 54 min    Equipment Utilized During Treatment Gait belt    Activity Tolerance Patient tolerated treatment well    Behavior During Therapy WFL for tasks assessed/performed             Past Medical History:  Diagnosis Date   Anxiety    Arthritis    Cancer ([place])    skin cancer   Complication of anesthesia    general anesthesia hard on memory after pt goes home   Depression    Fracture of one rib    GERD (gastroesophageal reflux disease)    was on nexium but no longer needs to take this medication   Hypertension    Macular degeneration of both eyes    Osteoporosis    Past Surgical History:  Procedure Laterality Date   ARM SKIN LESION BIOPSY / EXCISION Right    CHOLECYSTECTOMY     COLONOSCOPY WITH PROPOFOL N/A 08/03/2015   Procedure: COLONOSCOPY WITH PROPOFOL;  Surgeon: Hulen Luster, MD;  Location[facility] ENDOSCOPY;  Service: Gastroenterology;  Laterality: N/A;   INTRAMEDULLARY (IM) NAIL INTERTROCHANTERIC Right 11/26/2015   Procedure: INTRAMEDULLARY (IM) NAIL INTERTROCHANTRIC;  Surgeon: Earnestine Leys, MD;  Location: [facility] ORS;  Service: Orthopedics;  Laterality: Right;   KYPHOPLASTY N/A 08/16/2019   Procedure: L4 KYPHOPLASTY;  Surgeon: Hessie Knows, MD;  Location: [facility] ORS;  Service: Orthopedics;  Laterality: N/A;   KYPHOPLASTY N/A 08/29/2019   Procedure: L2 KYPHOPLASTY;  Surgeon: Hessie Knows, MD;  Location: [facility] ORS;  Service: Orthopedics;  Laterality: N/A;   KYPHOPLASTY N/A 10/10/2019   Procedure: L3  KYPHOPLASTY;  Surgeon: Hessie Knows, MD;  Location: [facility] ORS;  Service: Orthopedics;  Laterality: N/A;   open heart surgery  2019   valve replacement, aortic arch  aneurysm repair   TONSILLECTOMY     TUBAL LIGATION     WRIST RECONSTRUCTION Right    Patient Active Problem List   Diagnosis Date Noted   Femur fracture, right, closed, initial encounter 11/25/2015    ONSET DATE: 04/05/22  REFERRING DIAG: R26.0 (ICD-10-CM) - Ataxic gait  THERAPY DIAG:  Difficulty in walking, not elsewhere classified  Muscle weakness (generalized)  Unsteadiness on feet  Abnormality of gait and mobility  Abnormal posture  Rationale for Evaluation and Treatment Rehabilitation  SUBJECTIVE:  SUBJECTIVE STATEMENT: Pt is well known to the clinic and has been working on things since home, but has not been working as well as they would like.    Pt accompanied by: family member, daughter.  PERTINENT HISTORY: Pt is referred to clinic for decreased balance.  Pt and daughter note that she has been doing exercises at home, although not as frequent.  Pt's daughter notes that the pt's endurance has declined and her ability to navigate curbs/stairs has declined as well.  MD and pt both agreed for second bout of therapy in order to return to PLOF.  PAIN:  Are you having pain? No  PRECAUTIONS: Fall  WEIGHT BEARING RESTRICTIONS No  FALLS: Has patient fallen in last 6 months? No  LIVING ENVIRONMENT: Lives with: lives alone Lives in: House/apartment Stairs: No Has following equipment at home: Gilford Rile - 4 wheeled; utilizes the furniture or walls at home.  PLOF: Independent with basic ADLs; utilizes the rollator in the morning until her vision and balance gets better in the morning.  PATIENT GOALS  Pt states that she would  like to be able to go up 1-2 steps/curbs that she needs to navigate when visiting other people in the family/friends.  Pt wants to improve stamina and endurance when ambulating in order to be more functional at home.  Pt unable to use the rollator to get inside the door as much as she was prior.  3# weight limit with UE activities  OBJECTIVE:   DIAGNOSTIC FINDINGS: None  COGNITION: Overall cognitive status: Within functional limits for tasks assessed   SENSATION: WFL  COORDINATION: WFL  POSTURE: rounded shoulders and forward head  LOWER EXTREMITY ROM:     WFL  LOWER EXTREMITY MMT:    MMT Right Eval Left Eval  Hip flexion 4 5  Hip abduction 4 4  Hip adduction 4 4  Knee flexion 4 4  Knee extension 5 5  Ankle dorsiflexion 4 4    GAIT: Gait pattern: step through pattern, trunk flexed, and wide BOS Assistive device utilized: None Level of assistance: SBA Comments: Pt with forward flexed posture during tests and measures necessary for goal assessments.  FUNCTIONAL TESTs:  5 times sit to stand: 18.59 sec Timed up and go (TUG): 18.42 sec 6 minute walk test: Not assessed during evaluation. 10 meter walk test: 14.63 sec Berg Balance Scale: Not assessed during evaluation Dynamic Gait Index: Not assessed during evaluation Functional gait assessment: Not assessed during evaluation FOTO: 44; Predicted 53  TODAY'S TREATMENT:   PT evaluation only.   PATIENT EDUCATION: Education details: Pt educated on role of PT and services provided during current POC, along with prognosis and information about the clinic. Person educated: Patient and Child(ren) Education method: Explanation Education comprehension: verbalized understanding   HOME EXERCISE PROGRAM: Give at next visit.    GOALS: Goals reviewed with patient? Yes  SHORT TERM GOALS: Target date: 05/23/2022  Pt will be independent with HEP in order to demonstrate increased ability to perform tasks related to  occupation/hobbies. Baseline: Not given HEP at IE. Goal status: INITIAL   LONG TERM GOALS: Target date: 07/18/2022  1.  Patient (> 65 years old) will complete five times sit to stand test in < 15 seconds indicating an increased LE strength and improved balance. Baseline: 18.59 sec Goal status: INITIAL  2.  Patient will increase FOTO score to equal to or greater than  53   to demonstrate statistically significant improvement in mobility and quality of life.  Baseline: 44 Goal status: INITIAL   3.  Patient will increase Berg Balance score by > 6 points to demonstrate decreased fall risk during functional activities. Baseline: Not assessed at IE. Goal status: INITIAL   4.  Patient will reduce timed up and go to <11 seconds to reduce fall risk and demonstrate improved transfer/gait ability. Baseline: 18.42 Goal status: INITIAL  5.  Patient will increase 10 meter walk test to >1.84ms as to improve gait speed for better community ambulation and to reduce fall risk. Baseline: 0.68 m.s Goal status: INITIAL  6.  Patient will increase six minute walk test distance to >1000 for progression to community ambulator and improve gait ability Baseline: Not assessed at IE. Goal status: INITIAL    ASSESSMENT:  CLINICAL IMPRESSION: Patient is a 81y.o. female who was seen today for physical therapy evaluation and treatment for ataxic gait.  Pt presents with physical impairments of decreased activity tolerance, decreased balance, and decreased strength in B LE as noted.  Pt will benefit from skilled therapy to address tolerance, balance, and strength impairments necessary for improvement in quality of life.  Pt. demonstrates understanding of this plan of care and agrees with this plan.     OBJECTIVE IMPAIRMENTS Abnormal gait, decreased activity tolerance, decreased balance, decreased mobility, difficulty walking, and decreased strength.   ACTIVITY LIMITATIONS lifting, bending, squatting, stairs,  transfers, and reach over head  PARTICIPATION LIMITATIONS: cleaning, laundry, community activity, and yard work  PLansingAge, Past/current experiences, and Time since onset of injury/illness/exacerbation are also affecting patient's functional outcome.   REHAB POTENTIAL: Good  CLINICAL DECISION MAKING: Stable/uncomplicated  EVALUATION COMPLEXITY: Low  PLAN: PT FREQUENCY: 2x/week  PT DURATION: 12 weeks  PLANNED INTERVENTIONS: Therapeutic exercises, Therapeutic activity, Neuromuscular re-education, Balance training, Gait training, Patient/Family education, Self Care, Joint mobilization, and Stair training  PLAN FOR NEXT SESSION: Give HEP, test 6 minute walk test, Berg, and DGI if possible.  Improve LE strength.   JGwenlyn Saran PT, DPT 04/25/22, 2:58 PM

## 2022-04-27 ENCOUNTER — Ambulatory Visit: Payer: Medicare HMO

## 2022-04-27 DIAGNOSIS — R262 Difficulty in walking, not elsewhere classified: Secondary | ICD-10-CM

## 2022-04-27 DIAGNOSIS — M6281 Muscle weakness (generalized): Secondary | ICD-10-CM

## 2022-04-27 DIAGNOSIS — R2681 Unsteadiness on feet: Secondary | ICD-10-CM | POA: Diagnosis not present

## 2022-04-27 DIAGNOSIS — R269 Unspecified abnormalities of gait and mobility: Secondary | ICD-10-CM

## 2022-04-27 DIAGNOSIS — R293 Abnormal posture: Secondary | ICD-10-CM | POA: Diagnosis not present

## 2022-04-27 NOTE — Therapy (Addendum)
OUTPATIENT PHYSICAL THERAPY TREATMENT NOTE   Patient Name: April Rogers MRN: [id number] DOB:04/27/1941, 81 y.o., female Today's Date: 04/27/2022   PCP: Adin Hector, MD REFERRING PROVIDER: Adin Hector, MD   PT End of Session - 04/27/22 1434     Visit Number 2    Number of Visits 24    Date for PT Re-Evaluation 07/18/22    Authorization Type [employer]    Authorization Time Period 04/25/22-07/18/22    Authorization - Visit Number 1    Authorization - Number of Visits 24    Progress Note Due on Visit 10    PT Start Time 1435    PT Stop Time 1515    PT Time Calculation (min) 40 min    Equipment Utilized During Treatment Gait belt    Activity Tolerance Patient tolerated treatment well    Behavior During Therapy WFL for tasks assessed/performed             Past Medical History:  Diagnosis Date   Anxiety    Arthritis    Cancer ([place])    skin cancer   Complication of anesthesia    general anesthesia hard on memory after pt goes home   Depression    Fracture of one rib    GERD (gastroesophageal reflux disease)    was on nexium but no longer needs to take this medication   Hypertension    Macular degeneration of both eyes    Osteoporosis    Past Surgical History:  Procedure Laterality Date   ARM SKIN LESION BIOPSY / EXCISION Right    CHOLECYSTECTOMY     COLONOSCOPY WITH PROPOFOL N/A 08/03/2015   Procedure: COLONOSCOPY WITH PROPOFOL;  Surgeon: Hulen Luster, MD;  Location: [facility] ENDOSCOPY;  Service: Gastroenterology;  Laterality: N/A;   INTRAMEDULLARY (IM) NAIL INTERTROCHANTERIC Right 11/26/2015   Procedure: INTRAMEDULLARY (IM) NAIL INTERTROCHANTRIC;  Surgeon: Earnestine Leys, MD;  Location: [facility] ORS;  Service: Orthopedics;  Laterality: Right;   KYPHOPLASTY N/A 08/16/2019   Procedure: L4 KYPHOPLASTY;  Surgeon: Hessie Knows, MD;  Location: [facility] ORS;  Service: Orthopedics;  Laterality: N/A;   KYPHOPLASTY N/A 08/29/2019   Procedure: L2 KYPHOPLASTY;  Surgeon: Hessie Knows, MD;  Location: [facility] ORS;  Service: Orthopedics;  Laterality: N/A;   KYPHOPLASTY N/A 10/10/2019   Procedure: L3 KYPHOPLASTY;  Surgeon: Hessie Knows, MD;  Location: [facility] ORS;  Service: Orthopedics;  Laterality: N/A;   open heart surgery  2019   valve replacement, aortic arch  aneurysm repair   TONSILLECTOMY     TUBAL LIGATION     WRIST RECONSTRUCTION Right    Patient Active Problem List   Diagnosis Date Noted   Femur fracture, right, closed, initial encounter 11/25/2015    ONSET DATE: 04/05/22  REFERRING DIAG: R26.0 (ICD-10-CM) - Ataxic gait  THERAPY DIAG:  Difficulty in walking, not elsewhere classified  Muscle weakness (generalized)  Unsteadiness on feet  Abnormality of gait and mobility  Abnormal posture  Rationale for Evaluation and Treatment Rehabilitation  SUBJECTIVE:  SUBJECTIVE STATEMENT:  Pt is accompanied by her daughter for this treatment session.  Pt notes she is looking forward to getting this session started.   Pt accompanied by: family member, daughter.  PERTINENT HISTORY: Pt is referred to clinic for decreased balance.  Pt and daughter note that she has been doing exercises at home, although not as frequent.  Pt's daughter notes that the pt's endurance has declined and her ability to navigate curbs/stairs has declined as well.  MD and pt both agreed for second bout of therapy in order to return to PLOF.  PAIN:  Are you having pain? No  PRECAUTIONS: Fall  WEIGHT BEARING RESTRICTIONS No  FALLS: Has patient fallen in last 6 months? No  LIVING ENVIRONMENT: Lives with: lives alone Lives in: House/apartment Stairs: No Has following equipment at home: Gilford Rile - 4 wheeled; utilizes the furniture or walls at home.  PLOF: Independent with basic ADLs; utilizes the  rollator in the morning until her vision and balance gets better in the morning.  PATIENT GOALS  Pt states that she would like to be able to go up 1-2 steps/curbs that she needs to navigate when visiting other people in the family/friends.  Pt wants to improve stamina and endurance when ambulating in order to be more functional at home.  Pt unable to use the rollator to get inside the door as much as she was prior.  3# weight limit with UE activities (apparently has been set by family members, not specifically MD)  OBJECTIVE:   DIAGNOSTIC FINDINGS: None  COGNITION: Overall cognitive status: Within functional limits for tasks assessed   SENSATION: WFL  COORDINATION: WFL  POSTURE: rounded shoulders and forward head  LOWER EXTREMITY ROM:     WFL  LOWER EXTREMITY MMT:    MMT Right Eval Left Eval  Hip flexion 4 5  Hip abduction 4 4  Hip adduction 4 4  Knee flexion 4 4  Knee extension 5 5  Ankle dorsiflexion 4 4    GAIT: Gait pattern: step through pattern, trunk flexed, and wide BOS Assistive device utilized: None Level of assistance: SBA Comments: Pt with forward flexed posture during tests and measures necessary for goal assessments.  FUNCTIONAL TESTs:  5 times sit to stand: 18.59 sec Timed up and go (TUG): 18.42 sec 6 minute walk test: Not assessed during evaluation. 10 meter walk test: 14.63 sec Berg Balance Scale: Not assessed during evaluation Dynamic Gait Index: Not assessed during evaluation Functional gait assessment: Not assessed during evaluation FOTO: 44; Predicted 53  TODAY'S TREATMENT:   Therapeutic Activity:  Continued with goal establishment as noted below, and answering of pt and daughter's question pertaining to regression from past values.  Pt and daughter educated on decreased performance and treatment approaches to target those deficits going forward.   PATIENT EDUCATION: Education details: Pt educated on role of PT and services provided  during current POC, along with prognosis and information about the clinic. Person educated: Patient and Child(ren) Education method: Explanation Education comprehension: verbalized understanding   HOME EXERCISE PROGRAM: Give at next visit.    GOALS: Goals reviewed with patient? Yes  SHORT TERM GOALS: Target date: 05/25/2022  Pt will be independent with HEP in order to demonstrate increased ability to perform tasks related to occupation/hobbies. Baseline: Not given HEP at IE. Goal status: INITIAL   LONG TERM GOALS: Target date: 07/20/2022  1.  Patient (> 77 years old) will complete five times sit to stand test in < 15 seconds indicating  an increased LE strength and improved balance. Baseline: 18.59 sec Goal status: INITIAL  2.  Patient will increase FOTO score to equal to or greater than 53 to demonstrate statistically significant improvement in mobility and quality of life.  Baseline: 44 Goal status: INITIAL   3.  Patient will increase Berg Balance score by > 6 points to demonstrate decreased fall risk during functional activities. Baseline: 46/56 Goal status: INITIAL   4.  Patient will reduce timed up and go to <11 seconds to reduce fall risk and demonstrate improved transfer/gait ability. Baseline: 18.42 Goal status: INITIAL  5.  Patient will increase 10 meter walk test to >1.83ms as to improve gait speed for better community ambulation and to reduce fall risk. Baseline: 0.68 m.s Goal status: INITIAL  6.  Patient will increase six minute walk test distance to >1000 for progression to community ambulator and improve gait ability Baseline: 565 ft, one standing rest break utilized at 5 min Goal status: INITIAL    ASSESSMENT:  CLINICAL IMPRESSION: All goals have been performed and established.  During 6MWT, pt required modA x2 instances to prevent pt from falling forward.  Pt tends to move too quickly and stumbles over her feet due to decreased foot clearance from  posture and weakness in the LE's.  Pt to get HEP at next visit in order to assist with improving overall strength of the LE's and prevent future falls.  Will continue to work with therapist for balance and endurance training as well.   Pt will continue to benefit from skilled therapy to address remaining deficits in order to improve overall QoL and return to PLOF.        OBJECTIVE IMPAIRMENTS Abnormal gait, decreased activity tolerance, decreased balance, decreased mobility, difficulty walking, and decreased strength.   ACTIVITY LIMITATIONS lifting, bending, squatting, stairs, transfers, and reach over head  PARTICIPATION LIMITATIONS: cleaning, laundry, community activity, and yard work  PNew HomeAge, Past/current experiences, and Time since onset of injury/illness/exacerbation are also affecting patient's functional outcome.   REHAB POTENTIAL: Good  CLINICAL DECISION MAKING: Stable/uncomplicated  EVALUATION COMPLEXITY: Low  PLAN: PT FREQUENCY: 2x/week  PT DURATION: 12 weeks  PLANNED INTERVENTIONS: Therapeutic exercises, Therapeutic activity, Neuromuscular re-education, Balance training, Gait training, Patient/Family education, Self Care, Joint mobilization, and Stair training  PLAN FOR NEXT SESSION: Give HEP, test 6 minute walk test, Berg, and DGI if possible.  Improve LE strength.   JGwenlyn Saran PT, DPT [facility] 04/27/22, 4:17 PM

## 2022-05-02 ENCOUNTER — Ambulatory Visit: Payer: Medicare HMO

## 2022-05-02 DIAGNOSIS — R2681 Unsteadiness on feet: Secondary | ICD-10-CM | POA: Diagnosis not present

## 2022-05-02 DIAGNOSIS — R262 Difficulty in walking, not elsewhere classified: Secondary | ICD-10-CM | POA: Diagnosis not present

## 2022-05-02 DIAGNOSIS — M6281 Muscle weakness (generalized): Secondary | ICD-10-CM

## 2022-05-02 DIAGNOSIS — R269 Unspecified abnormalities of gait and mobility: Secondary | ICD-10-CM

## 2022-05-02 DIAGNOSIS — R293 Abnormal posture: Secondary | ICD-10-CM | POA: Diagnosis not present

## 2022-05-02 NOTE — Therapy (Signed)
OUTPATIENT PHYSICAL THERAPY TREATMENT NOTE   Patient Name: April Rogers MRN: [id number] DOB:10-Jul-1941, 81 y.o., female Today's Date: 05/02/2022   PCP: Adin Hector, MD REFERRING PROVIDER: Adin Hector, MD   PT End of Session - 05/02/22 1433     Visit Number 3    Number of Visits 24    Date for PT Re-Evaluation 07/18/22    Authorization Type [employer]    Authorization Time Period 04/25/22-07/18/22    Authorization - Visit Number 1    Authorization - Number of Visits 24    Progress Note Due on Visit 10    PT Start Time 1432    PT Stop Time 1516    PT Time Calculation (min) 44 min    Equipment Utilized During Treatment Gait belt    Activity Tolerance Patient tolerated treatment well    Behavior During Therapy WFL for tasks assessed/performed             Past Medical History:  Diagnosis Date   Anxiety    Arthritis    Cancer ([place])    skin cancer   Complication of anesthesia    general anesthesia hard on memory after pt goes home   Depression    Fracture of one rib    GERD (gastroesophageal reflux disease)    was on nexium but no longer needs to take this medication   Hypertension    Macular degeneration of both eyes    Osteoporosis    Past Surgical History:  Procedure Laterality Date   ARM SKIN LESION BIOPSY / EXCISION Right    CHOLECYSTECTOMY     COLONOSCOPY WITH PROPOFOL N/A 08/03/2015   Procedure: COLONOSCOPY WITH PROPOFOL;  Surgeon: Hulen Luster, MD;  Location: [facility] ENDOSCOPY;  Service: Gastroenterology;  Laterality: N/A;   INTRAMEDULLARY (IM) NAIL INTERTROCHANTERIC Right 11/26/2015   Procedure: INTRAMEDULLARY (IM) NAIL INTERTROCHANTRIC;  Surgeon: Earnestine Leys, MD;  Location: [facility] ORS;  Service: Orthopedics;  Laterality: Right;   KYPHOPLASTY N/A 08/16/2019   Procedure: L4 KYPHOPLASTY;  Surgeon: Hessie Knows, MD;  Location: [facility] ORS;  Service: Orthopedics;  Laterality: N/A;   KYPHOPLASTY N/A 08/29/2019   Procedure: L2 KYPHOPLASTY;  Surgeon: Hessie Knows, MD;  Location: [facility] ORS;  Service: Orthopedics;  Laterality: N/A;   KYPHOPLASTY N/A 10/10/2019   Procedure: L3 KYPHOPLASTY;  Surgeon: Hessie Knows, MD;  Location: [facility] ORS;  Service: Orthopedics;  Laterality: N/A;   open heart surgery  2019   valve replacement, aortic arch  aneurysm repair   TONSILLECTOMY     TUBAL LIGATION     WRIST RECONSTRUCTION Right    Patient Active Problem List   Diagnosis Date Noted   Femur fracture, right, closed, initial encounter 11/25/2015    ONSET DATE: 04/05/22  REFERRING DIAG: R26.0 (ICD-10-CM) - Ataxic gait  THERAPY DIAG:  Difficulty in walking, not elsewhere classified  Muscle weakness (generalized)  Unsteadiness on feet  Abnormality of gait and mobility  Abnormal posture  Rationale for Evaluation and Treatment Rehabilitation  SUBJECTIVE:  SUBJECTIVE STATEMENT:  Pt reports she had a good weekend and was able to do some laps in her garage over the weekend.  Pt's daughter accompanying her today.     Pt accompanied by: family member, daughter.  PERTINENT HISTORY: Pt is referred to clinic for decreased balance.  Pt and daughter note that she has been doing exercises at home, although not as frequent.  Pt's daughter notes that the pt's endurance has declined and her ability to navigate curbs/stairs has declined as well.  MD and pt both agreed for second bout of therapy in order to return to PLOF.  PAIN:  Are you having pain? No  PRECAUTIONS: Fall  WEIGHT BEARING RESTRICTIONS No  FALLS: Has patient fallen in last 6 months? No  LIVING ENVIRONMENT: Lives with: lives alone Lives in: House/apartment Stairs: No Has following equipment at home: Gilford Rile - 4 wheeled; utilizes the furniture or walls at home.  PLOF: Independent with basic ADLs;  utilizes the rollator in the morning until her vision and balance gets better in the morning.  PATIENT GOALS  Pt states that she would like to be able to go up 1-2 steps/curbs that she needs to navigate when visiting other people in the family/friends.  Pt wants to improve stamina and endurance when ambulating in order to be more functional at home.  Pt unable to use the rollator to get inside the door as much as she was prior.  3# weight limit with UE activities (apparently has been set by family members, not specifically MD)  OBJECTIVE:   DIAGNOSTIC FINDINGS: None  COGNITION: Overall cognitive status: Within functional limits for tasks assessed   SENSATION: WFL  COORDINATION: WFL  POSTURE: rounded shoulders and forward head  LOWER EXTREMITY ROM:     WFL  LOWER EXTREMITY MMT:    MMT Right Eval Left Eval  Hip flexion 4 5  Hip abduction 4 4  Hip adduction 4 4  Knee flexion 4 4  Knee extension 5 5  Ankle dorsiflexion 4 4    GAIT: Gait pattern: step through pattern, trunk flexed, and wide BOS Assistive device utilized: None Level of assistance: SBA Comments: Pt with forward flexed posture during tests and measures necessary for goal assessments.  FUNCTIONAL TESTs:  5 times sit to stand: 18.59 sec Timed up and go (TUG): 18.42 sec 6 minute walk test: Not assessed during evaluation. 10 meter walk test: 14.63 sec Berg Balance Scale: Not assessed during evaluation Dynamic Gait Index: Not assessed during evaluation Functional gait assessment: Not assessed during evaluation FOTO: 44; Predicted 53  TODAY'S TREATMENT:   TherEx:  Ambulation around the gym, 148' x2.5 laps for endurance SpO2: 95%; HR: 92   Seated LAQ 3# AW donned, x12 Standing hip abduction, 2x10 each LE Standing hip extension, 2x10 each LE Standing hamstring curls, 2x10 each LE Standing heel raises, 2x10 Standing marches on Airex pad for improved proprioception, 2x10 each LE Sitting LAQ with 3#  AW donned, 2x12 each LE Sitting marches with 3# AW donned, 2x12 each LE Sitting lateral step over hedgehogs with 3# AW donned, 2x12 each LE Sitting hamstring curls with BlueTB, 2x12 each LE     PATIENT EDUCATION: Education details: Pt educated on role of PT and services provided during current POC, along with prognosis and information about the clinic. Person educated: Patient and Child(ren) Education method: Explanation Education comprehension: verbalized understanding   HOME EXERCISE PROGRAM:  Access Code: [email or web address] URL: [email or web address] Date: 05/03/2022 Prepared by: [place] [employer]  Exercises -  Standing Hip Abduction with Counter Support  - 1 x daily - 7 x weekly - 3 sets - 10 reps - Standing Hip Extension with Counter Support  - 1 x daily - 7 x weekly - 3 sets - 10 reps - Standing March with Counter Support  - 1 x daily - 7 x weekly - 3 sets - 10 reps - Heel Raises with Counter Support  - 1 x daily - 7 x weekly - 3 sets - 10 reps - Mini Squat with Counter Support  - 1 x daily - 7 x weekly - 3 sets - 10 reps - Seated Long Arc Quad  - 1 x daily - 7 x weekly - 3 sets - 10 reps    GOALS: Goals reviewed with patient? Yes  SHORT TERM GOALS: Target date: 05/30/2022  Pt will be independent with HEP in order to demonstrate increased ability to perform tasks related to occupation/hobbies. Baseline: Not given HEP at IE. Goal status: INITIAL   LONG TERM GOALS: Target date: 07/25/2022  1.  Patient (> 17 years old) will complete five times sit to stand test in < 15 seconds indicating an increased LE strength and improved balance. Baseline: 18.59 sec Goal status: INITIAL  2.  Patient will increase FOTO score to equal to or greater than 53 to demonstrate statistically significant improvement in mobility and quality of life.  Baseline: 44 Goal status: INITIAL   3.  Patient will increase Berg Balance score by > 6 points to demonstrate decreased fall risk during  functional activities. Baseline: 46/56 Goal status: INITIAL   4.  Patient will reduce timed up and go to <11 seconds to reduce fall risk and demonstrate improved transfer/gait ability. Baseline: 18.42 Goal status: INITIAL  5.  Patient will increase 10 meter walk test to >1.24ms as to improve gait speed for better community ambulation and to reduce fall risk. Baseline: 0.68 m.s Goal status: INITIAL  6.  Patient will increase six minute walk test distance to >1000 for progression to community ambulator and improve gait ability Baseline: 565 ft, one standing rest break utilized at 5 min Goal status: INITIAL    ASSESSMENT:  CLINICAL IMPRESSION:  Pt performed well with all the exercises given and put forth good effort throughout the session.  Pt able to perform tasks with good technique throughout, needing only verbal cuing sparingly for proper form.  Pt is progressing well and has good strength in LE, which will continue to improve in order to assist pt with balance related tasks in future sessions.   Pt will continue to benefit from skilled therapy to address remaining deficits in order to improve overall QoL and return to PLOF.        OBJECTIVE IMPAIRMENTS Abnormal gait, decreased activity tolerance, decreased balance, decreased mobility, difficulty walking, and decreased strength.   ACTIVITY LIMITATIONS lifting, bending, squatting, stairs, transfers, and reach over head  PARTICIPATION LIMITATIONS: cleaning, laundry, community activity, and yard work  [place], Past/current experiences, and Time since onset of injury/illness/exacerbation are also affecting patient's functional outcome.   REHAB POTENTIAL: Good  CLINICAL DECISION MAKING: Stable/uncomplicated  EVALUATION COMPLEXITY: Low  PLAN: PT FREQUENCY: 2x/week  PT DURATION: 12 weeks  PLANNED INTERVENTIONS: Therapeutic exercises, Therapeutic activity, Neuromuscular re-education, Balance training, Gait  training, Patient/Family education, Self Care, Joint mobilization, and Stair training  PLAN FOR NEXT SESSION: Give HEP, test 6 minute walk test, Berg, and DGI if possible.  Improve LE strength.   JGwenlyn Saran PT, DPT  Physical Therapist- [facility]  05/02/22, 2:34 PM

## 2022-05-04 ENCOUNTER — Ambulatory Visit: Payer: Medicare HMO

## 2022-05-09 ENCOUNTER — Ambulatory Visit: Payer: Medicare HMO

## 2022-05-09 DIAGNOSIS — R262 Difficulty in walking, not elsewhere classified: Secondary | ICD-10-CM

## 2022-05-09 DIAGNOSIS — M6281 Muscle weakness (generalized): Secondary | ICD-10-CM

## 2022-05-09 DIAGNOSIS — R269 Unspecified abnormalities of gait and mobility: Secondary | ICD-10-CM

## 2022-05-09 DIAGNOSIS — R293 Abnormal posture: Secondary | ICD-10-CM | POA: Diagnosis not present

## 2022-05-09 DIAGNOSIS — R2681 Unsteadiness on feet: Secondary | ICD-10-CM | POA: Diagnosis not present

## 2022-05-09 NOTE — Therapy (Signed)
OUTPATIENT PHYSICAL THERAPY TREATMENT NOTE   Patient Name: April Rogers MRN: [id number] DOB:12-23-1940, 81 y.o., female Today's Date: 05/09/2022   PCP: Adin Hector, MD REFERRING PROVIDER: Adin Hector, MD   PT End of Session - 05/09/22 1517     Visit Number 4    Number of Visits 24    Date for PT Re-Evaluation 07/18/22    Authorization Type [employer]    Authorization Time Period 04/25/22-07/18/22    Authorization - Visit Number 1    Authorization - Number of Visits 24    Progress Note Due on Visit 10    PT Start Time 2423    PT Stop Time 1609    PT Time Calculation (min) 55 min    Equipment Utilized During Treatment Gait belt    Activity Tolerance Patient tolerated treatment well    Behavior During Therapy WFL for tasks assessed/performed              Past Medical History:  Diagnosis Date   Anxiety    Arthritis    Cancer ([place])    skin cancer   Complication of anesthesia    general anesthesia hard on memory after pt goes home   Depression    Fracture of one rib    GERD (gastroesophageal reflux disease)    was on nexium but no longer needs to take this medication   Hypertension    Macular degeneration of both eyes    Osteoporosis    Past Surgical History:  Procedure Laterality Date   ARM SKIN LESION BIOPSY / EXCISION Right    CHOLECYSTECTOMY     COLONOSCOPY WITH PROPOFOL N/A 08/03/2015   Procedure: COLONOSCOPY WITH PROPOFOL;  Surgeon: Hulen Luster, MD;  Location: [facility]COPY;  Service: Gastroenterology;  Laterality: N/A;   INTRAMEDULLARY (IM) NAIL INTERTROCHANTERIC Right 11/26/2015   Procedure: INTRAMEDULLARY (IM) NAIL INTERTROCHANTRIC;  Surgeon: Earnestine Leys, MD;  Location: [facility] ORS;  Service: Orthopedics;  Laterality: Right;   KYPHOPLASTY N/A 08/16/2019   Procedure: L4 KYPHOPLASTY;  Surgeon: Hessie Knows, MD;  Location: [facility] ORS;  Service: Orthopedics;  Laterality: N/A;   KYPHOPLASTY N/A 08/29/2019   Procedure: L2 KYPHOPLASTY;  Surgeon:  Hessie Knows, MD;  Location: [facility] ORS;  Service: Orthopedics;  Laterality: N/A;   KYPHOPLASTY N/A 10/10/2019   Procedure: L3 KYPHOPLASTY;  Surgeon: Hessie Knows, MD;  Location: [facility] ORS;  Service: Orthopedics;  Laterality: N/A;   open heart surgery  2019   valve replacement, aortic arch  aneurysm repair   TONSILLECTOMY     TUBAL LIGATION     WRIST RECONSTRUCTION Right    Patient Active Problem List   Diagnosis Date Noted   Femur fracture, right, closed, initial encounter 11/25/2015    ONSET DATE: 04/05/22  REFERRING DIAG: R26.0 (ICD-10-CM) - Ataxic gait  THERAPY DIAG:  Difficulty in walking, not elsewhere classified  Muscle weakness (generalized)  Unsteadiness on feet  Abnormality of gait and mobility  Abnormal posture  Rationale for Evaluation and Treatment Rehabilitation   SUBJECTIVE:  SUBJECTIVE STATEMENT:  Pt reports she had a restful weekend and is doing much better than last week.  Daughter reports that last week the pt had difficulty with confusion and being generally weak throughout the day which is why she had to cancel the appointment.  Pt is tearful this session at times when speaking of her experience last week but is ready to participate in therapy.   Pt accompanied by: family member, daughter.  PERTINENT HISTORY: Pt is referred to clinic for decreased balance.  Pt and daughter note that she has been doing exercises at home, although not as frequent.  Pt's daughter notes that the pt's endurance has declined and her ability to navigate curbs/stairs has declined as well.  MD and pt both agreed for second bout of therapy in order to return to PLOF.  PAIN:  Are you having pain? No  PRECAUTIONS: Fall  WEIGHT BEARING RESTRICTIONS No  FALLS: Has patient fallen in last 6 months?  No  LIVING ENVIRONMENT: Lives with: lives alone Lives in: House/apartment Stairs: No Has following equipment at home: Gilford Rile - 4 wheeled; utilizes the furniture or walls at home.  PLOF: Independent with basic ADLs; utilizes the rollator in the morning until her vision and balance gets better in the morning.  PATIENT GOALS  Pt states that she would like to be able to go up 1-2 steps/curbs that she needs to navigate when visiting other people in the family/friends.  Pt wants to improve stamina and endurance when ambulating in order to be more functional at home.  Pt unable to use the rollator to get inside the door as much as she was prior.  3# weight limit with UE activities (apparently has been set by family members, not specifically MD)  OBJECTIVE:   DIAGNOSTIC FINDINGS: None  COGNITION: Overall cognitive status: Within functional limits for tasks assessed   SENSATION: WFL  COORDINATION: WFL  POSTURE: rounded shoulders and forward head  LOWER EXTREMITY ROM:     WFL  LOWER EXTREMITY MMT:    MMT Right Eval Left Eval  Hip flexion 4 5  Hip abduction 4 4  Hip adduction 4 4  Knee flexion 4 4  Knee extension 5 5  Ankle dorsiflexion 4 4    GAIT: Gait pattern: step through pattern, trunk flexed, and wide BOS Assistive device utilized: None Level of assistance: SBA Comments: Pt with forward flexed posture during tests and measures necessary for goal assessments.  FUNCTIONAL TESTs:  5 times sit to stand: 18.59 sec Timed up and go (TUG): 18.42 sec 6 minute walk test: Not assessed during evaluation. 10 meter walk test: 14.63 sec Berg Balance Scale: Not assessed during evaluation Dynamic Gait Index: Not assessed during evaluation Functional gait assessment: Not assessed during evaluation FOTO: 44; Predicted 53  TODAY'S TREATMENT:   TherEx:  Ambulation around the gym, 148' x3 laps for endurance  Seated LAQ 3# AW donned, 2x15 Sitting marches with 3# AW donned,  2x15 each LE Standing hip abduction 3# AW donned, 2x15 each LE Standing hip extension 3# AW donned, 2x10 each LE Standing hamstring curls 3# AW donned, 2x10 each LE Standing heel raises, 3# AW donned, 2x10 Standing toe raises, 3# AW donned, 2x10   Neuro   Standing marches on Airex pad for improved proprioception, 2x10 each LE Static stance on inclined board, 45 sec bouts Static stance with eyes closed on inclined board, 4 sec bouts   PATIENT EDUCATION: Education details: Pt educated on role of PT and services  provided during current POC, along with prognosis and information about the clinic. Person educated: Patient and Child(ren) Education method: Explanation Education comprehension: verbalized understanding   HOME EXERCISE PROGRAM:  Access Code: [email or web address] URL: [email or web address] Date: 05/03/2022 Prepared by: [place]  Exercises - Standing Hip Abduction with Counter Support  - 1 x daily - 7 x weekly - 3 sets - 10 reps - Standing Hip Extension with Counter Support  - 1 x daily - 7 x weekly - 3 sets - 10 reps - Standing March with Counter Support  - 1 x daily - 7 x weekly - 3 sets - 10 reps - Heel Raises with Counter Support  - 1 x daily - 7 x weekly - 3 sets - 10 reps - Mini Squat with Counter Support  - 1 x daily - 7 x weekly - 3 sets - 10 reps - Seated Long Arc Quad  - 1 x daily - 7 x weekly - 3 sets - 10 reps    GOALS: Goals reviewed with patient? Yes  SHORT TERM GOALS: Target date: 06/06/2022  Pt will be independent with HEP in order to demonstrate increased ability to perform tasks related to occupation/hobbies. Baseline: Not given HEP at IE. Goal status: INITIAL   LONG TERM GOALS: Target date: 08/01/2022  1.  Patient (> 57 years old) will complete five times sit to stand test in < 15 seconds indicating an increased LE strength and improved balance. Baseline: 18.59 sec Goal status: INITIAL  2.  Patient will increase FOTO score to equal  to or greater than 53 to demonstrate statistically significant improvement in mobility and quality of life.  Baseline: 44 Goal status: INITIAL   3.  Patient will increase Berg Balance score by > 6 points to demonstrate decreased fall risk during functional activities. Baseline: 46/56 Goal status: INITIAL   4.  Patient will reduce timed up and go to <11 seconds to reduce fall risk and demonstrate improved transfer/gait ability. Baseline: 18.42 Goal status: INITIAL  5.  Patient will increase 10 meter walk test to >1.59ms as to improve gait speed for better community ambulation and to reduce fall risk. Baseline: 0.68 m.s Goal status: INITIAL  6.  Patient will increase six minute walk test distance to >1000 for progression to community ambulator and improve gait ability Baseline: 565 ft, one standing rest break utilized at 5 min Goal status: INITIAL    ASSESSMENT:  CLINICAL IMPRESSION:  Pt performed well with exercises today and was able to tolerate increased gait for endurance training.  Pt still requiring verbal and visual cuing for widening her BOS to prevent future falls going forward.  Pt continues to demonstrate improved mobility and balance as therapy progresses.  Pt to continue to benefit from strength training in order to improve overall function and reduce risk of falling.        OBJECTIVE IMPAIRMENTS Abnormal gait, decreased activity tolerance, decreased balance, decreased mobility, difficulty walking, and decreased strength.   ACTIVITY LIMITATIONS lifting, bending, squatting, stairs, transfers, and reach over head  PARTICIPATION LIMITATIONS: cleaning, laundry, community activity, and yard work  [place], Past/current experiences, and Time since onset of injury/illness/exacerbation are also affecting patient's functional outcome.   REHAB POTENTIAL: Good  CLINICAL DECISION MAKING: Stable/uncomplicated  EVALUATION COMPLEXITY: Low  PLAN: PT FREQUENCY:  2x/week  PT DURATION: 12 weeks  PLANNED INTERVENTIONS: Therapeutic exercises, Therapeutic activity, Neuromuscular re-education, Balance training, Gait training, Patient/Family education, Self Care, Joint mobilization, and Stair training  PLAN FOR  NEXT SESSION: Give HEP, test 6 minute walk test, Berg, and DGI if possible.  Improve LE strength.   Gwenlyn Saran, PT, DPT Physical Therapist- [facility]  05/09/22, 4:50 PM

## 2022-05-11 ENCOUNTER — Ambulatory Visit: Payer: Medicare HMO | Attending: Internal Medicine

## 2022-05-11 DIAGNOSIS — M6281 Muscle weakness (generalized): Secondary | ICD-10-CM | POA: Insufficient documentation

## 2022-05-11 DIAGNOSIS — R293 Abnormal posture: Secondary | ICD-10-CM | POA: Diagnosis not present

## 2022-05-11 DIAGNOSIS — R269 Unspecified abnormalities of gait and mobility: Secondary | ICD-10-CM | POA: Insufficient documentation

## 2022-05-11 DIAGNOSIS — R262 Difficulty in walking, not elsewhere classified: Secondary | ICD-10-CM | POA: Insufficient documentation

## 2022-05-11 DIAGNOSIS — R2681 Unsteadiness on feet: Secondary | ICD-10-CM | POA: Diagnosis not present

## 2022-05-11 NOTE — Therapy (Signed)
OUTPATIENT PHYSICAL THERAPY TREATMENT NOTE   Patient Name: April Rogers MRN: [id number] DOB:1940/07/15, 81 y.o., female Today's Date: 05/11/2022   PCP: Adin Hector, MD REFERRING PROVIDER: Adin Hector, MD   PT End of Session - 05/11/22 1438     Visit Number 5    Number of Visits 24    Date for PT Re-Evaluation 07/18/22    Authorization Type [employer]    Authorization Time Period 04/25/22-07/18/22    Authorization - Visit Number 5    Authorization - Number of Visits 24    Progress Note Due on Visit 10    PT Start Time 4098    PT Stop Time 1515    PT Time Calculation (min) 42 min    Equipment Utilized During Treatment Gait belt    Activity Tolerance Patient tolerated treatment well    Behavior During Therapy WFL for tasks assessed/performed              Past Medical History:  Diagnosis Date   Anxiety    Arthritis    Cancer ([place])    skin cancer   Complication of anesthesia    general anesthesia hard on memory after pt goes home   Depression    Fracture of one rib    GERD (gastroesophageal reflux disease)    was on nexium but no longer needs to take this medication   Hypertension    Macular degeneration of both eyes    Osteoporosis    Past Surgical History:  Procedure Laterality Date   ARM SKIN LESION BIOPSY / EXCISION Right    CHOLECYSTECTOMY     COLONOSCOPY WITH PROPOFOL N/A 08/03/2015   Procedure: COLONOSCOPY WITH PROPOFOL;  Surgeon: Hulen Luster, MD;  Location: [facility] ENDOSCOPY;  Service: Gastroenterology;  Laterality: N/A;   INTRAMEDULLARY (IM) NAIL INTERTROCHANTERIC Right 11/26/2015   Procedure: INTRAMEDULLARY (IM) NAIL INTERTROCHANTRIC;  Surgeon: Earnestine Leys, MD;  Location: [facility] ORS;  Service: Orthopedics;  Laterality: Right;   KYPHOPLASTY N/A 08/16/2019   Procedure: L4 KYPHOPLASTY;  Surgeon: Hessie Knows, MD;  Location: [facility] ORS;  Service: Orthopedics;  Laterality: N/A;   KYPHOPLASTY N/A 08/29/2019   Procedure: L2 KYPHOPLASTY;  Surgeon: Hessie Knows, MD;  Location: [facility] ORS;  Service: Orthopedics;  Laterality: N/A;   KYPHOPLASTY N/A 10/10/2019   Procedure: L3 KYPHOPLASTY;  Surgeon: Hessie Knows, MD;  Location: [facility] ORS;  Service: Orthopedics;  Laterality: N/A;   open heart surgery  2019   valve replacement, aortic arch  aneurysm repair   TONSILLECTOMY     TUBAL LIGATION     WRIST RECONSTRUCTION Right    Patient Active Problem List   Diagnosis Date Noted   Femur fracture, right, closed, initial encounter 11/25/2015    ONSET DATE: 04/05/22  REFERRING DIAG: R26.0 (ICD-10-CM) - Ataxic gait  THERAPY DIAG:  Difficulty in walking, not elsewhere classified  Muscle weakness (generalized)  Unsteadiness on feet  Abnormality of gait and mobility  Abnormal posture  Rationale for Evaluation and Treatment Rehabilitation   SUBJECTIVE:  SUBJECTIVE STATEMENT:  Pt reports she had a restful weekend and is doing much better than last week.  Daughter reports that last week the pt had difficulty with confusion and being generally weak throughout the day which is why she had to cancel the appointment.  Pt is tearful this session at times when speaking of her experience last week but is ready to participate in therapy.   Pt accompanied by: family member, daughter.  PERTINENT HISTORY: Pt is referred to clinic for decreased balance.  Pt and daughter note that she has been doing exercises at home, although not as frequent.  Pt's daughter notes that the pt's endurance has declined and her ability to navigate curbs/stairs has declined as well.  MD and pt both agreed for second bout of therapy in order to return to PLOF.  PAIN:  Are you having pain? No  PRECAUTIONS: Fall  WEIGHT BEARING RESTRICTIONS No  FALLS: Has patient fallen in last 6 months?  No  LIVING ENVIRONMENT: Lives with: lives alone Lives in: House/apartment Stairs: No Has following equipment at home: Gilford Rile - 4 wheeled; utilizes the furniture or walls at home.  PLOF: Independent with basic ADLs; utilizes the rollator in the morning until her vision and balance gets better in the morning.  PATIENT GOALS  Pt states that she would like to be able to go up 1-2 steps/curbs that she needs to navigate when visiting other people in the family/friends.  Pt wants to improve stamina and endurance when ambulating in order to be more functional at home.  Pt unable to use the rollator to get inside the door as much as she was prior.  3# weight limit with UE activities (apparently has been set by family members, not specifically MD)  OBJECTIVE:   DIAGNOSTIC FINDINGS: None  COGNITION: Overall cognitive status: Within functional limits for tasks assessed   SENSATION: WFL  COORDINATION: WFL  POSTURE: rounded shoulders and forward head  LOWER EXTREMITY ROM:     WFL  LOWER EXTREMITY MMT:    MMT Right Eval Left Eval  Hip flexion 4 5  Hip abduction 4 4  Hip adduction 4 4  Knee flexion 4 4  Knee extension 5 5  Ankle dorsiflexion 4 4    GAIT: Gait pattern: step through pattern, trunk flexed, and wide BOS Assistive device utilized: None Level of assistance: SBA Comments: Pt with forward flexed posture during tests and measures necessary for goal assessments.  FUNCTIONAL TESTs:  5 times sit to stand: 18.59 sec Timed up and go (TUG): 18.42 sec 6 minute walk test: Not assessed during evaluation. 10 meter walk test: 14.63 sec Berg Balance Scale: Not assessed during evaluation Dynamic Gait Index: Not assessed during evaluation Functional gait assessment: Not assessed during evaluation FOTO: 44; Predicted 53  TODAY'S TREATMENT:   TherEx:  Ambulation around the gym, no AD, 148' x3 laps for endurance  Seated LAQ 3# AW donned, 2x15 Seated marches with 3# AW  donned, 2x15 each LE Seated hamstring curls, BTB resistance, 2x15 each LE    Neuro   Dynamic standing on Airex pad while solving hangman puzzle, utilizing cross-body reaching for letters, 15 minutes spent performing   PATIENT EDUCATION: Education details: Pt educated on role of PT and services provided during current POC, along with prognosis and information about the clinic. Person educated: Patient and Child(ren) Education method: Explanation Education comprehension: verbalized understanding   HOME EXERCISE PROGRAM:  Access Code: [email or web address] URL: [email or web address] Date: 05/03/2022 Prepared by: [place]  Exercises - Standing Hip Abduction with Counter Support  - 1 x daily - 7 x weekly - 3 sets - 10 reps - Standing Hip Extension with Counter Support  - 1 x daily - 7 x weekly - 3 sets - 10 reps - Standing March with Counter Support  - 1 x daily - 7 x weekly - 3 sets - 10 reps - Heel Raises with Counter Support  - 1 x daily - 7 x weekly - 3 sets - 10 reps - Mini Squat with Counter Support  - 1 x daily - 7 x weekly - 3 sets - 10 reps - Seated Long Arc Quad  - 1 x daily - 7 x weekly - 3 sets - 10 reps    GOALS: Goals reviewed with patient? Yes  SHORT TERM GOALS: Target date: 06/08/2022  Pt will be independent with HEP in order to demonstrate increased ability to perform tasks related to occupation/hobbies. Baseline: Not given HEP at IE. Goal status: INITIAL   LONG TERM GOALS: Target date: 08/03/2022  1.  Patient (> 66 years old) will complete five times sit to stand test in < 15 seconds indicating an increased LE strength and improved balance. Baseline: 18.59 sec Goal status: INITIAL  2.  Patient will increase FOTO score to equal to or greater than 53 to demonstrate statistically significant improvement in mobility and quality of life.  Baseline: 44 Goal status: INITIAL   3.  Patient will increase Berg Balance score by > 6 points to demonstrate  decreased fall risk during functional activities. Baseline: 46/56 Goal status: INITIAL   4.  Patient will reduce timed up and go to <11 seconds to reduce fall risk and demonstrate improved transfer/gait ability. Baseline: 18.42 Goal status: INITIAL  5.  Patient will increase 10 meter walk test to >1.53ms as to improve gait speed for better community ambulation and to reduce fall risk. Baseline: 0.68 m.s Goal status: INITIAL  6.  Patient will increase six minute walk test distance to >1000 for progression to community ambulator and improve gait ability Baseline: 565 ft, one standing rest break utilized at 5 min Goal status: INITIAL    ASSESSMENT:  CLINICAL IMPRESSION:  Pt performed well with exercises and noted to be in much better spirits during the session.  Pt not tearful this session and was very talkative and participatory.  Pt still sticking with lower weights due to family request for 3# limite.  . Pt will continue to benefit from skilled therapy to address remaining deficits in order to improve overall QoL and return to PLOF.         OBJECTIVE IMPAIRMENTS Abnormal gait, decreased activity tolerance, decreased balance, decreased mobility, difficulty walking, and decreased strength.   ACTIVITY LIMITATIONS lifting, bending, squatting, stairs, transfers, and reach over head  PARTICIPATION LIMITATIONS: cleaning, laundry, community activity, and yard work  PHalchitaAge, Past/current experiences, and Time since onset of injury/illness/exacerbation are also affecting patient's functional outcome.   REHAB POTENTIAL: Good  CLINICAL DECISION MAKING: Stable/uncomplicated  EVALUATION COMPLEXITY: Low  PLAN: PT FREQUENCY: 2x/week  PT DURATION: 12 weeks  PLANNED INTERVENTIONS: Therapeutic exercises, Therapeutic activity, Neuromuscular re-education, Balance training, Gait training, Patient/Family education, Self Care, Joint mobilization, and Stair training  PLAN FOR  NEXT SESSION: Continue to improve LE strength.   JGwenlyn Saran PT, DPT Physical Therapist- [facility] 05/11/22, 4:14 PM

## 2022-05-16 ENCOUNTER — Ambulatory Visit: Payer: Medicare HMO

## 2022-05-16 DIAGNOSIS — R269 Unspecified abnormalities of gait and mobility: Secondary | ICD-10-CM

## 2022-05-16 DIAGNOSIS — R262 Difficulty in walking, not elsewhere classified: Secondary | ICD-10-CM | POA: Diagnosis not present

## 2022-05-16 DIAGNOSIS — R293 Abnormal posture: Secondary | ICD-10-CM

## 2022-05-16 DIAGNOSIS — M6281 Muscle weakness (generalized): Secondary | ICD-10-CM

## 2022-05-16 DIAGNOSIS — R2681 Unsteadiness on feet: Secondary | ICD-10-CM

## 2022-05-16 NOTE — Therapy (Addendum)
OUTPATIENT PHYSICAL THERAPY TREATMENT NOTE   Patient Name: April Rogers MRN: [id number] DOB:04-06-1941, 81 y.o., female Today's Date: 05/16/2022   PCP: Adin Hector, MD REFERRING PROVIDER: Adin Hector, MD   PT End of Session - 05/16/22 1431     Visit Number 6    Number of Visits 24    Date for PT Re-Evaluation 07/18/22    Authorization Type [employer]    Authorization Time Period 04/25/22-07/18/22    Authorization - Visit Number 6    Authorization - Number of Visits 24    Progress Note Due on Visit 10    PT Start Time 1430    PT Stop Time 1515    PT Time Calculation (min) 45 min    Equipment Utilized During Treatment Gait belt    Activity Tolerance Patient tolerated treatment well    Behavior During Therapy WFL for tasks assessed/performed               Past Medical History:  Diagnosis Date   Anxiety    Arthritis    Cancer ([place])    skin cancer   Complication of anesthesia    general anesthesia hard on memory after pt goes home   Depression    Fracture of one rib    GERD (gastroesophageal reflux disease)    was on nexium but no longer needs to take this medication   Hypertension    Macular degeneration of both eyes    Osteoporosis    Past Surgical History:  Procedure Laterality Date   ARM SKIN LESION BIOPSY / EXCISION Right    CHOLECYSTECTOMY     COLONOSCOPY WITH PROPOFOL N/A 08/03/2015   Procedure: COLONOSCOPY WITH PROPOFOL;  Surgeon: Hulen Luster, MD;  Location: [facility] ENDOSCOPY;  Service: Gastroenterology;  Laterality: N/A;   INTRAMEDULLARY (IM) NAIL INTERTROCHANTERIC Right 11/26/2015   Procedure: INTRAMEDULLARY (IM) NAIL INTERTROCHANTRIC;  Surgeon: Earnestine Leys, MD;  Location: [facility] ORS;  Service: Orthopedics;  Laterality: Right;   KYPHOPLASTY N/A 08/16/2019   Procedure: L4 KYPHOPLASTY;  Surgeon: Hessie Knows, MD;  Location: [facility] ORS;  Service: Orthopedics;  Laterality: N/A;   KYPHOPLASTY N/A 08/29/2019   Procedure: L2 KYPHOPLASTY;  Surgeon:  Hessie Knows, MD;  Location: [facility] ORS;  Service: Orthopedics;  Laterality: N/A;   KYPHOPLASTY N/A 10/10/2019   Procedure: L3 KYPHOPLASTY;  Surgeon: Hessie Knows, MD;  Location: [facility] ORS;  Service: Orthopedics;  Laterality: N/A;   open heart surgery  2019   valve replacement, aortic arch  aneurysm repair   TONSILLECTOMY     TUBAL LIGATION     WRIST RECONSTRUCTION Right    Patient Active Problem List   Diagnosis Date Noted   Femur fracture, right, closed, initial encounter 11/25/2015    ONSET DATE: 04/05/22  REFERRING DIAG: R26.0 (ICD-10-CM) - Ataxic gait  THERAPY DIAG:  Difficulty in walking, not elsewhere classified  Muscle weakness (generalized)  Unsteadiness on feet  Abnormality of gait and mobility  Abnormal posture  Rationale for Evaluation and Treatment Rehabilitation   SUBJECTIVE:  SUBJECTIVE STATEMENT: Pt reports she was pretty active over the weekend, doing her exercises and walking in her garage.  Pt states that she had a good weekend watching the [place] and other races.   Pt accompanied by: family member, daughter.  PERTINENT HISTORY: Pt is referred to clinic for decreased balance.  Pt and daughter note that she has been doing exercises at home, although not as frequent.  Pt's daughter notes that the pt's endurance has declined and her ability to navigate curbs/stairs has declined as well.  MD and pt both agreed for second bout of therapy in order to return to PLOF.  PAIN:  Are you having pain? No  PRECAUTIONS: Fall  WEIGHT BEARING RESTRICTIONS No  FALLS: Has patient fallen in last 6 months? No  LIVING ENVIRONMENT: Lives with: lives alone Lives in: House/apartment Stairs: No Has following equipment at home: Gilford Rile - 4 wheeled; utilizes the furniture or walls at  home.  PLOF: Independent with basic ADLs; utilizes the rollator in the morning until her vision and balance gets better in the morning.  PATIENT GOALS  Pt states that she would like to be able to go up 1-2 steps/curbs that she needs to navigate when visiting other people in the family/friends.  Pt wants to improve stamina and endurance when ambulating in order to be more functional at home.  Pt unable to use the rollator to get inside the door as much as she was prior.  3# weight limit with UE activities (apparently has been set by family members, not specifically MD)  OBJECTIVE:   DIAGNOSTIC FINDINGS: None  COGNITION: Overall cognitive status: Within functional limits for tasks assessed   SENSATION: WFL  COORDINATION: WFL  POSTURE: rounded shoulders and forward head  LOWER EXTREMITY ROM:     WFL  LOWER EXTREMITY MMT:    MMT Right Eval Left Eval  Hip flexion 4 5  Hip abduction 4 4  Hip adduction 4 4  Knee flexion 4 4  Knee extension 5 5  Ankle dorsiflexion 4 4    GAIT: Gait pattern: step through pattern, trunk flexed, and wide BOS Assistive device utilized: None Level of assistance: SBA Comments: Pt with forward flexed posture during tests and measures necessary for goal assessments.  FUNCTIONAL TESTs:  5 times sit to stand: 18.59 sec Timed up and go (TUG): 18.42 sec 6 minute walk test: Not assessed during evaluation. 10 meter walk test: 14.63 sec Berg Balance Scale: Not assessed during evaluation Dynamic Gait Index: Not assessed during evaluation Functional gait assessment: Not assessed during evaluation FOTO: 44; Predicted 53  TODAY'S TREATMENT:   TherEx:  Ambulation around the gym, no AD, 148' x3 laps for endurance with 3# AW donned  Seated LAQ 3# AW donned, 2x15 Seated marches with 3# AW donned, 2x15 each LE Seated hamstring curls, BTB resistance, 2x15 each LE Standing hip abduction with 3# AW donned, x15 each LE Standing hip extension with 3# AW  donned, x15 each LE     PATIENT EDUCATION: Education details: Pt educated on role of PT and services provided during current POC, along with prognosis and information about the clinic. Person educated: Patient and Child(ren) Education method: Explanation Education comprehension: verbalized understanding   HOME EXERCISE PROGRAM:  Access Code: [email or web address] URL: [email or web address] Date: 05/03/2022 Prepared by: [place]  Exercises - Standing Hip Abduction with Counter Support  - 1 x daily - 7 x weekly - 3 sets - 10 reps - Standing Hip Extension with Counter Support  -  1 x daily - 7 x weekly - 3 sets - 10 reps - Standing March with Counter Support  - 1 x daily - 7 x weekly - 3 sets - 10 reps - Heel Raises with Counter Support  - 1 x daily - 7 x weekly - 3 sets - 10 reps - Mini Squat with Counter Support  - 1 x daily - 7 x weekly - 3 sets - 10 reps - Seated Long Arc Quad  - 1 x daily - 7 x weekly - 3 sets - 10 reps    GOALS: Goals reviewed with patient? Yes  SHORT TERM GOALS: Target date: 06/13/2022  Pt will be independent with HEP in order to demonstrate increased ability to perform tasks related to occupation/hobbies. Baseline: Not given HEP at IE. Goal status: INITIAL   LONG TERM GOALS: Target date: 08/08/2022  1.  Patient (> 50 years old) will complete five times sit to stand test in < 15 seconds indicating an increased LE strength and improved balance. Baseline: 18.59 sec Goal status: INITIAL  2.  Patient will increase FOTO score to equal to or greater than 53 to demonstrate statistically significant improvement in mobility and quality of life.  Baseline: 44 Goal status: INITIAL   3.  Patient will increase Berg Balance score by > 6 points to demonstrate decreased fall risk during functional activities. Baseline: 46/56 Goal status: INITIAL   4.  Patient will reduce timed up and go to <11 seconds to reduce fall risk and demonstrate improved  transfer/gait ability. Baseline: 18.42 Goal status: INITIAL  5.  Patient will increase 10 meter walk test to >1.29ms as to improve gait speed for better community ambulation and to reduce fall risk. Baseline: 0.68 m.s Goal status: INITIAL  6.  Patient will increase six minute walk test distance to >1000 for progression to community ambulator and improve gait ability Baseline: 565 ft, one standing rest break utilized at 5 min Goal status: INITIAL    ASSESSMENT:  CLINICAL IMPRESSION:  Pt continued to show improvement compared to the previous sessions.  KJuliann Pulse(daughter) states that the pt may have had a UTI which caused the pt to be different physically, mentally, and emotionally.  Pt is making quick rebound and is improving with overall strength measures as therapy progresses.  Pt still limited by endurance at this time and will continue to benefit from increased endurance training.   Pt will continue to benefit from skilled therapy to address remaining deficits in order to improve overall QoL and return to PLOF.      OBJECTIVE IMPAIRMENTS Abnormal gait, decreased activity tolerance, decreased balance, decreased mobility, difficulty walking, and decreased strength.   ACTIVITY LIMITATIONS lifting, bending, squatting, stairs, transfers, and reach over head  PARTICIPATION LIMITATIONS: cleaning, laundry, community activity, and yard work  PPackwoodAge, Past/current experiences, and Time since onset of injury/illness/exacerbation are also affecting patient's functional outcome.   REHAB POTENTIAL: Good  CLINICAL DECISION MAKING: Stable/uncomplicated  EVALUATION COMPLEXITY: Low  PLAN: PT FREQUENCY: 2x/week  PT DURATION: 12 weeks  PLANNED INTERVENTIONS: Therapeutic exercises, Therapeutic activity, Neuromuscular re-education, Balance training, Gait training, Patient/Family education, Self Care, Joint mobilization, and Stair training  PLAN FOR NEXT SESSION: Continue to improve  LE strength and work on balance with task oriented items.   JGwenlyn Saran PT, DPT Physical Therapist- [facility] 05/16/22, 3:50 PM

## 2022-05-18 ENCOUNTER — Ambulatory Visit: Payer: Medicare HMO

## 2022-05-18 DIAGNOSIS — R293 Abnormal posture: Secondary | ICD-10-CM

## 2022-05-18 DIAGNOSIS — M6281 Muscle weakness (generalized): Secondary | ICD-10-CM

## 2022-05-18 DIAGNOSIS — R262 Difficulty in walking, not elsewhere classified: Secondary | ICD-10-CM

## 2022-05-18 DIAGNOSIS — R2681 Unsteadiness on feet: Secondary | ICD-10-CM | POA: Diagnosis not present

## 2022-05-18 DIAGNOSIS — R269 Unspecified abnormalities of gait and mobility: Secondary | ICD-10-CM | POA: Diagnosis not present

## 2022-05-18 NOTE — Therapy (Signed)
OUTPATIENT PHYSICAL THERAPY TREATMENT NOTE   Patient Name: April Rogers MRN: [id number] DOB:Feb 18, 1941, 81 y.o., female Today's Date: 05/18/2022   PCP: Adin Hector, MD REFERRING PROVIDER: Adin Hector, MD   PT End of Session - 05/18/22 1519     Visit Number 7    Number of Visits 24    Date for PT Re-Evaluation 07/18/22    Authorization Type [employer]    Authorization Time Period 04/25/22-07/18/22    Authorization - Visit Number 7    Authorization - Number of Visits 24    Progress Note Due on Visit 10    PT Start Time 9211    PT Stop Time 1600    PT Time Calculation (min) 45 min    Equipment Utilized During Treatment Gait belt    Activity Tolerance Patient tolerated treatment well    Behavior During Therapy WFL for tasks assessed/performed               Past Medical History:  Diagnosis Date   Anxiety    Arthritis    Cancer ([place])    skin cancer   Complication of anesthesia    general anesthesia hard on memory after pt goes home   Depression    Fracture of one rib    GERD (gastroesophageal reflux disease)    was on nexium but no longer needs to take this medication   Hypertension    Macular degeneration of both eyes    Osteoporosis    Past Surgical History:  Procedure Laterality Date   ARM SKIN LESION BIOPSY / EXCISION Right    CHOLECYSTECTOMY     COLONOSCOPY WITH PROPOFOL N/A 08/03/2015   Procedure: COLONOSCOPY WITH PROPOFOL;  Surgeon: Hulen Luster, MD;  Location: [facility] ENDOSCOPY;  Service: Gastroenterology;  Laterality: N/A;   INTRAMEDULLARY (IM) NAIL INTERTROCHANTERIC Right 11/26/2015   Procedure: INTRAMEDULLARY (IM) NAIL INTERTROCHANTRIC;  Surgeon: Earnestine Leys, MD;  Location: [facility] ORS;  Service: Orthopedics;  Laterality: Right;   KYPHOPLASTY N/A 08/16/2019   Procedure: L4 KYPHOPLASTY;  Surgeon: Hessie Knows, MD;  Location: [facility] ORS;  Service: Orthopedics;  Laterality: N/A;   KYPHOPLASTY N/A 08/29/2019   Procedure: L2 KYPHOPLASTY;  Surgeon:  Hessie Knows, MD;  Location: [facility] ORS;  Service: Orthopedics;  Laterality: N/A;   KYPHOPLASTY N/A 10/10/2019   Procedure: L3 KYPHOPLASTY;  Surgeon: Hessie Knows, MD;  Location: [facility] ORS;  Service: Orthopedics;  Laterality: N/A;   open heart surgery  2019   valve replacement, aortic arch  aneurysm repair   TONSILLECTOMY     TUBAL LIGATION     WRIST RECONSTRUCTION Right    Patient Active Problem List   Diagnosis Date Noted   Femur fracture, right, closed, initial encounter 11/25/2015    ONSET DATE: 04/05/22  REFERRING DIAG: R26.0 (ICD-10-CM) - Ataxic gait  THERAPY DIAG:  Difficulty in walking, not elsewhere classified  Muscle weakness (generalized)  Unsteadiness on feet  Abnormality of gait and mobility  Abnormal posture  Rationale for Evaluation and Treatment Rehabilitation   SUBJECTIVE:  SUBJECTIVE STATEMENT:  Pt reports she was    Pt accompanied by: family member, daughter.  PERTINENT HISTORY: Pt is referred to clinic for decreased balance.  Pt and daughter note that she has been doing exercises at home, although not as frequent.  Pt's daughter notes that the pt's endurance has declined and her ability to navigate curbs/stairs has declined as well.  MD and pt both agreed for second bout of therapy in order to return to PLOF.  PAIN:  Are you having pain? No  PRECAUTIONS: Fall  WEIGHT BEARING RESTRICTIONS No  FALLS: Has patient fallen in last 6 months? No  LIVING ENVIRONMENT: Lives with: lives alone Lives in: House/apartment Stairs: No Has following equipment at home: Gilford Rile - 4 wheeled; utilizes the furniture or walls at home.  PLOF: Independent with basic ADLs; utilizes the rollator in the morning until her vision and balance gets better in the morning.  PATIENT GOALS  Pt  states that she would like to be able to go up 1-2 steps/curbs that she needs to navigate when visiting other people in the family/friends.  Pt wants to improve stamina and endurance when ambulating in order to be more functional at home.  Pt unable to use the rollator to get inside the door as much as she was prior.  3# weight limit with UE activities (apparently has been set by family members, not specifically MD)  OBJECTIVE:   DIAGNOSTIC FINDINGS: None  COGNITION: Overall cognitive status: Within functional limits for tasks assessed   SENSATION: WFL  COORDINATION: WFL  POSTURE: rounded shoulders and forward head  LOWER EXTREMITY ROM:     WFL  LOWER EXTREMITY MMT:    MMT Right Eval Left Eval  Hip flexion 4 5  Hip abduction 4 4  Hip adduction 4 4  Knee flexion 4 4  Knee extension 5 5  Ankle dorsiflexion 4 4    GAIT: Gait pattern: step through pattern, trunk flexed, and wide BOS Assistive device utilized: None Level of assistance: SBA Comments: Pt with forward flexed posture during tests and measures necessary for goal assessments.  FUNCTIONAL TESTs:  5 times sit to stand: 18.59 sec Timed up and go (TUG): 18.42 sec 6 minute walk test: Not assessed during evaluation. 10 meter walk test: 14.63 sec Berg Balance Scale: Not assessed during evaluation Dynamic Gait Index: Not assessed during evaluation Functional gait assessment: Not assessed during evaluation FOTO: 44; Predicted 53  TODAY'S TREATMENT:   TherEx:  Ambulation around the gym, no AD, 148' x3 laps for endurance with 3# AW donned  Seated LAQ 3# AW donned, 2x15 Seated marches with 3# AW donned, 2x15 each LE Standing hamstring curls, 3# AW donned, 2x15 each LE Standing hip abduction with 3# AW donned, x15 each LE Standing hip extension with 3# AW donned, x15 each LE Standing calf raises, 3# AW donned, 2x15    Neuro:  Dynamic basketball toss while standing on airex pad, including cross body  reaching to each side, x2 baskets full Dynamic dart toss while standing on airex pad and pt adding up score as she threw dart, x3 bags of darts     PATIENT EDUCATION: Education details: Pt educated on role of PT and services provided during current POC, along with prognosis and information about the clinic. Person educated: Patient and Child(ren) Education method: Explanation Education comprehension: verbalized understanding   HOME EXERCISE PROGRAM:  Access Code: [email or web address] URL: [email or web address] Date: 05/03/2022 Prepared by: [place] Nation  Exercises - Standing Hip Abduction with  Counter Support  - 1 x daily - 7 x weekly - 3 sets - 10 reps - Standing Hip Extension with Counter Support  - 1 x daily - 7 x weekly - 3 sets - 10 reps - Standing March with Counter Support  - 1 x daily - 7 x weekly - 3 sets - 10 reps - Heel Raises with Counter Support  - 1 x daily - 7 x weekly - 3 sets - 10 reps - Mini Squat with Counter Support  - 1 x daily - 7 x weekly - 3 sets - 10 reps - Seated Long Arc Quad  - 1 x daily - 7 x weekly - 3 sets - 10 reps    GOALS: Goals reviewed with patient? Yes  SHORT TERM GOALS: Target date: 06/15/2022  Pt will be independent with HEP in order to demonstrate increased ability to perform tasks related to occupation/hobbies. Baseline: Not given HEP at IE. Goal status: INITIAL   LONG TERM GOALS: Target date: 08/10/2022  1.  Patient (> 40 years old) will complete five times sit to stand test in < 15 seconds indicating an increased LE strength and improved balance. Baseline: 18.59 sec Goal status: INITIAL  2.  Patient will increase FOTO score to equal to or greater than 53 to demonstrate statistically significant improvement in mobility and quality of life.  Baseline: 44 Goal status: INITIAL   3.  Patient will increase Berg Balance score by > 6 points to demonstrate decreased fall risk during functional activities. Baseline: 46/56 Goal  status: INITIAL   4.  Patient will reduce timed up and go to <11 seconds to reduce fall risk and demonstrate improved transfer/gait ability. Baseline: 18.42 Goal status: INITIAL  5.  Patient will increase 10 meter walk test to >1.54ms as to improve gait speed for better community ambulation and to reduce fall risk. Baseline: 0.68 m.s Goal status: INITIAL  6.  Patient will increase six minute walk test distance to >1000 for progression to community ambulator and improve gait ability Baseline: 565 ft, one standing rest break utilized at 5 min Goal status: INITIAL    ASSESSMENT:  CLINICAL IMPRESSION:  Pt performed well with therapy and notes that today was the most she has sweated since coming to PT.  She really enjoyed the balance related tasks and performed well with dynamic tasks.  Pt may progress to 4# AW at next visit to see if pt is tolerable to increase in resistance.  Will continue to engage pt in meaningful balance related tasks during future sessions.  Pt will continue to benefit from skilled therapy to address remaining deficits in order to improve overall QoL and return to PLOF.       OBJECTIVE IMPAIRMENTS Abnormal gait, decreased activity tolerance, decreased balance, decreased mobility, difficulty walking, and decreased strength.   ACTIVITY LIMITATIONS lifting, bending, squatting, stairs, transfers, and reach over head  PARTICIPATION LIMITATIONS: cleaning, laundry, community activity, and yard work  [place], Past/current experiences, and Time since onset of injury/illness/exacerbation are also affecting patient's functional outcome.   REHAB POTENTIAL: Good  CLINICAL DECISION MAKING: Stable/uncomplicated  EVALUATION COMPLEXITY: Low  PLAN: PT FREQUENCY: 2x/week  PT DURATION: 12 weeks  PLANNED INTERVENTIONS: Therapeutic exercises, Therapeutic activity, Neuromuscular re-education, Balance training, Gait training, Patient/Family education, Self Care,  Joint mobilization, and Stair training  PLAN FOR NEXT SESSION: Continue to improve LE strength and work on balance with task oriented items.   JGwenlyn Saran PT, DPT Physical Therapist- [place]  [facility]  05/18/22, 3:19 PM

## 2022-05-23 ENCOUNTER — Ambulatory Visit: Payer: Medicare HMO

## 2022-05-23 DIAGNOSIS — M6281 Muscle weakness (generalized): Secondary | ICD-10-CM

## 2022-05-23 DIAGNOSIS — R262 Difficulty in walking, not elsewhere classified: Secondary | ICD-10-CM | POA: Diagnosis not present

## 2022-05-23 DIAGNOSIS — R293 Abnormal posture: Secondary | ICD-10-CM

## 2022-05-23 DIAGNOSIS — R2681 Unsteadiness on feet: Secondary | ICD-10-CM

## 2022-05-23 DIAGNOSIS — R269 Unspecified abnormalities of gait and mobility: Secondary | ICD-10-CM | POA: Diagnosis not present

## 2022-05-23 NOTE — Therapy (Signed)
OUTPATIENT PHYSICAL THERAPY TREATMENT NOTE   Patient Name: April Rogers MRN: [id number] DOB:1941-06-04, 81 y.o., female Today's Date: 05/23/2022   PCP: April Hector, MD REFERRING PROVIDER: Adin Hector, MD   PT End of Session - 05/23/22 1603     Visit Number 8    Number of Visits 24    Date for PT Re-Evaluation 07/18/22    Authorization Type [employer]    Authorization Time Period 04/25/22-07/18/22    Authorization - Visit Number 8    Authorization - Number of Visits 24    Progress Note Due on Visit 10    PT Start Time 1603    PT Stop Time 1646    PT Time Calculation (min) 43 min    Equipment Utilized During Treatment Gait belt    Activity Tolerance Patient tolerated treatment well    Behavior During Therapy WFL for tasks assessed/performed               Past Medical History:  Diagnosis Date   Anxiety    Arthritis    Cancer ([place])    skin cancer   Complication of anesthesia    general anesthesia hard on memory after pt goes home   Depression    Fracture of one rib    GERD (gastroesophageal reflux disease)    was on nexium but no longer needs to take this medication   Hypertension    Macular degeneration of both eyes    Osteoporosis    Past Surgical History:  Procedure Laterality Date   ARM SKIN LESION BIOPSY / EXCISION Right    CHOLECYSTECTOMY     COLONOSCOPY WITH PROPOFOL N/A 08/03/2015   Procedure: COLONOSCOPY WITH PROPOFOL;  Surgeon: April Luster, MD;  Location[facility] ENDOSCOPY;  Service: Gastroenterology;  Laterality: N/A;   INTRAMEDULLARY (IM) NAIL INTERTROCHANTERIC Right 11/26/2015   Procedure: INTRAMEDULLARY (IM) NAIL INTERTROCHANTRIC;  Surgeon: April Leys, MD;  Location: [facility] ORS;  Service: Orthopedics;  Laterality: Right;   KYPHOPLASTY N/A 08/16/2019   Procedure: L4 KYPHOPLASTY;  Surgeon: April Knows, MD;  Location: [facility] ORS;  Service: Orthopedics;  Laterality: N/A;   KYPHOPLASTY N/A 08/29/2019   Procedure: L2 KYPHOPLASTY;  Surgeon:  April Knows, MD;  Location: [facility] ORS;  Service: Orthopedics;  Laterality: N/A;   KYPHOPLASTY N/A 10/10/2019   Procedure: L3 KYPHOPLASTY;  Surgeon: April Knows, MD;  Location: [facility] ORS;  Service: Orthopedics;  Laterality: N/A;   open heart surgery  2019   valve replacement, aortic arch  aneurysm repair   TONSILLECTOMY     TUBAL LIGATION     WRIST RECONSTRUCTION Right    Patient Active Problem List   Diagnosis Date Noted   Femur fracture, right, closed, initial encounter 11/25/2015    ONSET DATE: 04/05/22  REFERRING DIAG: R26.0 (ICD-10-CM) - Ataxic gait  THERAPY DIAG:  Difficulty in walking, not elsewhere classified  Muscle weakness (generalized)  Unsteadiness on feet  Abnormality of gait and mobility  Abnormal posture  Rationale for Evaluation and Treatment Rehabilitation   SUBJECTIVE:  SUBJECTIVE STATEMENT:  Pt reports she was not as active over the weekend due to just feeling weak.  Pt notes she still attempted to perform her exercises, but was unable to walk as much as she would normally like to.     Pt accompanied by: family member, daughter, April Rogers.  PERTINENT HISTORY: Pt is referred to clinic for decreased balance.  Pt and daughter note that she has been doing exercises at home, although not as frequent.  Pt's daughter notes that the pt's endurance has declined and her ability to navigate curbs/stairs has declined as well.  MD and pt both agreed for second bout of therapy in order to return to PLOF.  PAIN:  Are you having pain? No  PRECAUTIONS: Fall  WEIGHT BEARING RESTRICTIONS No  FALLS: Has patient fallen in last 6 months? No  LIVING ENVIRONMENT: Lives with: lives alone Lives in: House/apartment Stairs: No Has following equipment at home: Gilford Rile - 4 wheeled; utilizes  the furniture or walls at home.  PLOF: Independent with basic ADLs; utilizes the rollator in the morning until her vision and balance gets better in the morning.  PATIENT GOALS  Pt states that she would like to be able to go up 1-2 steps/curbs that she needs to navigate when visiting other people in the family/friends.  Pt wants to improve stamina and endurance when ambulating in order to be more functional at home.  Pt unable to use the rollator to get inside the door as much as she was prior.  3# weight limit with UE activities (apparently has been set by family members, not specifically MD)  OBJECTIVE:   DIAGNOSTIC FINDINGS: None  COGNITION: Overall cognitive status: Within functional limits for tasks assessed   SENSATION: WFL  COORDINATION: WFL  POSTURE: rounded shoulders and forward head  LOWER EXTREMITY ROM:     WFL  LOWER EXTREMITY MMT:    MMT Right Eval Left Eval  Hip flexion 4 5  Hip abduction 4 4  Hip adduction 4 4  Knee flexion 4 4  Knee extension 5 5  Ankle dorsiflexion 4 4    GAIT: Gait pattern: step through pattern, trunk flexed, and wide BOS Assistive device utilized: None Level of assistance: SBA Comments: Pt with forward flexed posture during tests and measures necessary for goal assessments.  FUNCTIONAL TESTs:  5 times sit to stand: 18.59 sec Timed up and go (TUG): 18.42 sec 6 minute walk test: Not assessed during evaluation. 10 meter walk test: 14.63 sec Berg Balance Scale: Not assessed during evaluation Dynamic Gait Index: Not assessed during evaluation Functional gait assessment: Not assessed during evaluation FOTO: 44; Predicted 53  TODAY'S TREATMENT:   Neuro  Ambulation around the gym, no AD, 148' x2 laps for endurance  Dynamic standing on airex pad with weight shifts and reaching outside BOS while finding upper case letters of the alphabet  Ambulation around the gym, no AD, 148' x 2 laps with pt noting one instance of buckling in  the L LE and stating that it was happening more often over the past few days.  Dynamic standing on airex pad with weight shifts and reaching outside BOS while finding lower case letters of the alphabet     PATIENT EDUCATION: Education details: Pt educated on role of PT and services provided during current POC, along with prognosis and information about the clinic. Person educated: Patient and Child(ren) Education method: Explanation Education comprehension: verbalized understanding   HOME EXERCISE PROGRAM:  Access Code: [email or web address] URL: [email or web address]  Date: 05/03/2022 Prepared by: [place] Nation  Exercises - Standing Hip Abduction with Counter Support  - 1 x daily - 7 x weekly - 3 sets - 10 reps - Standing Hip Extension with Counter Support  - 1 x daily - 7 x weekly - 3 sets - 10 reps - Standing March with Counter Support  - 1 x daily - 7 x weekly - 3 sets - 10 reps - Heel Raises with Counter Support  - 1 x daily - 7 x weekly - 3 sets - 10 reps - Mini Squat with Counter Support  - 1 x daily - 7 x weekly - 3 sets - 10 reps - Seated Long Arc Quad  - 1 x daily - 7 x weekly - 3 sets - 10 reps    GOALS: Goals reviewed with patient? Yes  SHORT TERM GOALS: Target date: 06/20/2022  Pt will be independent with HEP in order to demonstrate increased ability to perform tasks related to occupation/hobbies. Baseline: Not given HEP at IE. Goal status: INITIAL   LONG TERM GOALS: Target date: 08/15/2022  1.  Patient (> 59 years old) will complete five times sit to stand test in < 15 seconds indicating an increased LE strength and improved balance. Baseline: 18.59 sec Goal status: INITIAL  2.  Patient will increase FOTO score to equal to or greater than 53 to demonstrate statistically significant improvement in mobility and quality of life.  Baseline: 44 Goal status: INITIAL   3.  Patient will increase Berg Balance score by > 6 points to demonstrate decreased fall risk  during functional activities. Baseline: 46/56 Goal status: INITIAL   4.  Patient will reduce timed up and go to <11 seconds to reduce fall risk and demonstrate improved transfer/gait ability. Baseline: 18.42 Goal status: INITIAL  5.  Patient will increase 10 meter walk test to >1.18ms as to improve gait speed for better community ambulation and to reduce fall risk. Baseline: 0.68 m.s Goal status: INITIAL  6.  Patient will increase six minute walk test distance to >1000 for progression to community ambulator and improve gait ability Baseline: 565 ft, one standing rest break utilized at 5 min Goal status: INITIAL    ASSESSMENT:  CLINICAL IMPRESSION: Pt performed extremely well and was on her feet for >30 minutes at a time without any significant LOB while standing on an Airex pad.  Pt is continuing to make significant improvements and will continue to benefit from improved strengthening of the LE's in order to ambulate longer distances with greater degrees of freedom.   Pt will continue to benefit from skilled therapy to address remaining deficits in order to improve overall QoL and return to PLOF.      OBJECTIVE IMPAIRMENTS Abnormal gait, decreased activity tolerance, decreased balance, decreased mobility, difficulty walking, and decreased strength.   ACTIVITY LIMITATIONS lifting, bending, squatting, stairs, transfers, and reach over head  PARTICIPATION LIMITATIONS: cleaning, laundry, community activity, and yard work  [place], Past/current experiences, and Time since onset of injury/illness/exacerbation are also affecting patient's functional outcome.   REHAB POTENTIAL: Good  CLINICAL DECISION MAKING: Stable/uncomplicated  EVALUATION COMPLEXITY: Low  PLAN: PT FREQUENCY: 2x/week  PT DURATION: 12 weeks  PLANNED INTERVENTIONS: Therapeutic exercises, Therapeutic activity, Neuromuscular re-education, Balance training, Gait training, Patient/Family education, Self  Care, Joint mobilization, and Stair training  PLAN FOR NEXT SESSION: Continue to improve LE strength and work on balance with task oriented items.   JGwenlyn Saran PT, DPT Physical Therapist- [place]  [facility]  05/23/22, 4:04 PM

## 2022-05-24 NOTE — Therapy (Signed)
OUTPATIENT PHYSICAL THERAPY TREATMENT NOTE   Patient Name: April Rogers MRN: [id number] DOB:Feb 06, 1941, 81 y.o., female Today's Date: 05/25/2022   PCP: Adin Hector, MD REFERRING PROVIDER: Adin Hector, MD   PT End of Session - 05/25/22 1512     Visit Number 9    Number of Visits 24    Date for PT Re-Evaluation 07/18/22    Authorization Type [employer]    Authorization Time Period 04/25/22-07/18/22    Authorization - Visit Number 74    Authorization - Number of Visits 24    Progress Note Due on Visit 10    PT Start Time 1512    PT Stop Time 1602    PT Time Calculation (min) 50 min    Equipment Utilized During Treatment Gait belt    Activity Tolerance Patient tolerated treatment well    Behavior During Therapy WFL for tasks assessed/performed                Past Medical History:  Diagnosis Date   Anxiety    Arthritis    Cancer ([place])    skin cancer   Complication of anesthesia    general anesthesia hard on memory after pt goes home   Depression    Fracture of one rib    GERD (gastroesophageal reflux disease)    was on nexium but no longer needs to take this medication   Hypertension    Macular degeneration of both eyes    Osteoporosis    Past Surgical History:  Procedure Laterality Date   ARM SKIN LESION BIOPSY / EXCISION Right    CHOLECYSTECTOMY     COLONOSCOPY WITH PROPOFOL N/A 08/03/2015   Procedure: COLONOSCOPY WITH PROPOFOL;  Surgeon: Hulen Luster, MD;  Location: [facility] ENDOSCOPY;  Service: Gastroenterology;  Laterality: N/A;   INTRAMEDULLARY (IM) NAIL INTERTROCHANTERIC Right 11/26/2015   Procedure: INTRAMEDULLARY (IM) NAIL INTERTROCHANTRIC;  Surgeon: Earnestine Leys, MD;  Location: [facility] ORS;  Service: Orthopedics;  Laterality: Right;   KYPHOPLASTY N/A 08/16/2019   Procedure: L4 KYPHOPLASTY;  Surgeon: Hessie Knows, MD;  Location: [facility] ORS;  Service: Orthopedics;  Laterality: N/A;   KYPHOPLASTY N/A 08/29/2019   Procedure: L2 KYPHOPLASTY;  Surgeon:  Hessie Knows, MD;  Location: [facility] ORS;  Service: Orthopedics;  Laterality: N/A;   KYPHOPLASTY N/A 10/10/2019   Procedure: L3 KYPHOPLASTY;  Surgeon: Hessie Knows, MD;  Location: [facility] ORS;  Service: Orthopedics;  Laterality: N/A;   open heart surgery  2019   valve replacement, aortic arch  aneurysm repair   TONSILLECTOMY     TUBAL LIGATION     WRIST RECONSTRUCTION Right    Patient Active Problem List   Diagnosis Date Noted   Femur fracture, right, closed, initial encounter 11/25/2015    ONSET DATE: 04/05/22  REFERRING DIAG: R26.0 (ICD-10-CM) - Ataxic gait  THERAPY DIAG:  Difficulty in walking, not elsewhere classified  Muscle weakness (generalized)  Unsteadiness on feet  Abnormality of gait and mobility  Abnormal posture  Rationale for Evaluation and Treatment Rehabilitation   SUBJECTIVE:  SUBJECTIVE STATEMENT:  Pt reports she was tired after last visit and slept really well that night.  Pt notes that she is in a much better mood now than she was this morning.  She states that the morning naps help her mood change.    Pt accompanied by: family member, daughter, [name].  PERTINENT HISTORY: Pt is referred to clinic for decreased balance.  Pt and daughter note that she has been doing exercises at home, although not as frequent.  Pt's daughter notes that the pt's endurance has declined and her ability to navigate curbs/stairs has declined as well.  MD and pt both agreed for second bout of therapy in order to return to PLOF.  PAIN:  Are you having pain? No  PRECAUTIONS: Fall  WEIGHT BEARING RESTRICTIONS No  FALLS: Has patient fallen in last 6 months? No  LIVING ENVIRONMENT: Lives with: lives alone Lives in: House/apartment Stairs: No Has following equipment at home: Gilford Rile - 4 wheeled;  utilizes the furniture or walls at home.  PLOF: Independent with basic ADLs; utilizes the rollator in the morning until her vision and balance gets better in the morning.  PATIENT GOALS  Pt states that she would like to be able to go up 1-2 steps/curbs that she needs to navigate when visiting other people in the family/friends.  Pt wants to improve stamina and endurance when ambulating in order to be more functional at home.  Pt unable to use the rollator to get inside the door as much as she was prior.  3# weight limit with UE activities (apparently has been set by family members, not specifically MD)  OBJECTIVE:   DIAGNOSTIC FINDINGS: None  COGNITION: Overall cognitive status: Within functional limits for tasks assessed   SENSATION: WFL  COORDINATION: WFL  POSTURE: rounded shoulders and forward head  LOWER EXTREMITY ROM:     WFL  LOWER EXTREMITY MMT:    MMT Right Eval Left Eval  Hip flexion 4 5  Hip abduction 4 4  Hip adduction 4 4  Knee flexion 4 4  Knee extension 5 5  Ankle dorsiflexion 4 4    GAIT: Gait pattern: step through pattern, trunk flexed, and wide BOS Assistive device utilized: None Level of assistance: SBA Comments: Pt with forward flexed posture during tests and measures necessary for goal assessments.  FUNCTIONAL TESTs:  5 times sit to stand: 18.59 sec Timed up and go (TUG): 18.42 sec 6 minute walk test: Not assessed during evaluation. 10 meter walk test: 14.63 sec Berg Balance Scale: Not assessed during evaluation Dynamic Gait Index: Not assessed during evaluation Functional gait assessment: Not assessed during evaluation FOTO: 44; Predicted 53   TODAY'S TREATMENT:   Neuro  Foam ball toss against wall with catching and righting reaction, 5x10 throws/catches  Ambulation around the gym, no AD, 148' x3 laps for endurance  Dynamic standing on airex pad with weight shifts and reaching outside BOS while throwing darts at board, and  cognitive task of adding score, 4 total rounds with one bag each round (10 throws each)  Crab walks along the airex balance beam, length of beam x4 each direction, modA necessary at times to prevent uncontrolled descent to the floor   PATIENT EDUCATION: Education details: Pt educated on role of PT and services provided during current POC, along with prognosis and information about the clinic. Person educated: Patient and Child(ren) Education method: Explanation Education comprehension: verbalized understanding   HOME EXERCISE PROGRAM:  Access Code: [email or web address] URL: [email or web address] Date: 05/03/2022  Prepared by: [place]  Exercises - Standing Hip Abduction with Counter Support  - 1 x daily - 7 x weekly - 3 sets - 10 reps - Standing Hip Extension with Counter Support  - 1 x daily - 7 x weekly - 3 sets - 10 reps - Standing March with Counter Support  - 1 x daily - 7 x weekly - 3 sets - 10 reps - Heel Raises with Counter Support  - 1 x daily - 7 x weekly - 3 sets - 10 reps - Mini Squat with Counter Support  - 1 x daily - 7 x weekly - 3 sets - 10 reps - Seated Long Arc Quad  - 1 x daily - 7 x weekly - 3 sets - 10 reps    GOALS: Goals reviewed with patient? Yes  SHORT TERM GOALS: Target date: 06/22/2022  Pt will be independent with HEP in order to demonstrate increased ability to perform tasks related to occupation/hobbies. Baseline: Not given HEP at IE. Goal status: INITIAL   LONG TERM GOALS: Target date: 08/17/2022  1.  Patient (> 57 years old) will complete five times sit to stand test in < 15 seconds indicating an increased LE strength and improved balance. Baseline: 18.59 sec Goal status: INITIAL  2.  Patient will increase FOTO score to equal to or greater than 53 to demonstrate statistically significant improvement in mobility and quality of life.  Baseline: 44 Goal status: INITIAL   3.  Patient will increase Berg Balance score by > 6 points to  demonstrate decreased fall risk during functional activities. Baseline: 46/56 Goal status: INITIAL   4.  Patient will reduce timed up and go to <11 seconds to reduce fall risk and demonstrate improved transfer/gait ability. Baseline: 18.42 Goal status: INITIAL  5.  Patient will increase 10 meter walk test to >1.26ms as to improve gait speed for better community ambulation and to reduce fall risk. Baseline: 0.68 m.s Goal status: INITIAL  6.  Patient will increase six minute walk test distance to >1000 for progression to community ambulator and improve gait ability Baseline: 565 ft, one standing rest break utilized at 5 min Goal status: INITIAL    ASSESSMENT:  CLINICAL IMPRESSION:  Pt performed well with exercises and was noted to be fatigued at the conclusion of the therapy session.  Pt continues to enjoy performing the dart toss and cognitive tasks that are associated with the tasks.  Pt is making significant improvements in proprioception and is able to handle the crab walks along the airex balance beam well.   Pt will continue to benefit from skilled therapy to address remaining deficits in order to improve overall QoL and return to PLOF.      OBJECTIVE IMPAIRMENTS Abnormal gait, decreased activity tolerance, decreased balance, decreased mobility, difficulty walking, and decreased strength.   ACTIVITY LIMITATIONS lifting, bending, squatting, stairs, transfers, and reach over head  PARTICIPATION LIMITATIONS: cleaning, laundry, community activity, and yard work  [place], Past/current experiences, and Time since onset of injury/illness/exacerbation are also affecting patient's functional outcome.   REHAB POTENTIAL: Good  CLINICAL DECISION MAKING: Stable/uncomplicated  EVALUATION COMPLEXITY: Low  PLAN: PT FREQUENCY: 2x/week  PT DURATION: 12 weeks  PLANNED INTERVENTIONS: Therapeutic exercises, Therapeutic activity, Neuromuscular re-education, Balance training,  Gait training, Patient/Family education, Self Care, Joint mobilization, and Stair training  PLAN FOR NEXT SESSION:   Continue to improve LE strength and work on balance with task oriented items.   JGwenlyn Saran PT, DPT  Physical Therapist- [facility]  05/25/22, 5:19 PM

## 2022-05-25 ENCOUNTER — Ambulatory Visit: Payer: Medicare HMO

## 2022-05-25 DIAGNOSIS — R269 Unspecified abnormalities of gait and mobility: Secondary | ICD-10-CM

## 2022-05-25 DIAGNOSIS — R2681 Unsteadiness on feet: Secondary | ICD-10-CM

## 2022-05-25 DIAGNOSIS — R293 Abnormal posture: Secondary | ICD-10-CM | POA: Diagnosis not present

## 2022-05-25 DIAGNOSIS — M6281 Muscle weakness (generalized): Secondary | ICD-10-CM

## 2022-05-25 DIAGNOSIS — R262 Difficulty in walking, not elsewhere classified: Secondary | ICD-10-CM

## 2022-05-30 DIAGNOSIS — I779 Disorder of arteries and arterioles, unspecified: Secondary | ICD-10-CM | POA: Diagnosis not present

## 2022-05-30 DIAGNOSIS — Z48812 Encounter for surgical aftercare following surgery on the circulatory system: Secondary | ICD-10-CM | POA: Diagnosis not present

## 2022-05-30 DIAGNOSIS — Z8679 Personal history of other diseases of the circulatory system: Secondary | ICD-10-CM | POA: Diagnosis not present

## 2022-05-30 DIAGNOSIS — Z952 Presence of prosthetic heart valve: Secondary | ICD-10-CM | POA: Diagnosis not present

## 2022-05-30 DIAGNOSIS — I517 Cardiomegaly: Secondary | ICD-10-CM | POA: Diagnosis not present

## 2022-05-30 DIAGNOSIS — Z87891 Personal history of nicotine dependence: Secondary | ICD-10-CM | POA: Diagnosis not present

## 2022-05-30 DIAGNOSIS — I7123 Aneurysm of the descending thoracic aorta, without rupture: Secondary | ICD-10-CM | POA: Diagnosis not present

## 2022-05-30 DIAGNOSIS — I351 Nonrheumatic aortic (valve) insufficiency: Secondary | ICD-10-CM | POA: Diagnosis not present

## 2022-05-30 DIAGNOSIS — Z9889 Other specified postprocedural states: Secondary | ICD-10-CM | POA: Diagnosis not present

## 2022-06-01 ENCOUNTER — Ambulatory Visit: Payer: Medicare HMO

## 2022-06-01 NOTE — Therapy (Incomplete)
OUTPATIENT PHYSICAL THERAPY TREATMENT NOTE   Patient Name: April Rogers MRN: [id number] DOB:29-Mar-1941, 81 y.o., female Today's Date: 06/01/2022   PCP: Adin Hector, MD REFERRING PROVIDER: Adin Hector, MD        Past Medical History:  Diagnosis Date   Anxiety    Arthritis    Cancer [facility])    skin cancer   Complication of anesthesia    general anesthesia hard on memory after pt goes home   Depression    Fracture of one rib    GERD (gastroesophageal reflux disease)    was on nexium but no longer needs to take this medication   Hypertension    Macular degeneration of both eyes    Osteoporosis    Past Surgical History:  Procedure Laterality Date   ARM SKIN LESION BIOPSY / EXCISION Right    CHOLECYSTECTOMY     COLONOSCOPY WITH PROPOFOL N/A 08/03/2015   Procedure: COLONOSCOPY WITH PROPOFOL;  Surgeon: Hulen Luster, MD;  Location: [facility] ENDOSCOPY;  Service: Gastroenterology;  Laterality: N/A;   INTRAMEDULLARY (IM) NAIL INTERTROCHANTERIC Right 11/26/2015   Procedure: INTRAMEDULLARY (IM) NAIL INTERTROCHANTRIC;  Surgeon: Earnestine Leys, MD;  Location: [facility] ORS;  Service: Orthopedics;  Laterality: Right;   KYPHOPLASTY N/A 08/16/2019   Procedure: L4 KYPHOPLASTY;  Surgeon: Hessie Knows, MD;  Location: [facility] ORS;  Service: Orthopedics;  Laterality: N/A;   KYPHOPLASTY N/A 08/29/2019   Procedure: L2 KYPHOPLASTY;  Surgeon: Hessie Knows, MD;  Location: [facility] ORS;  Service: Orthopedics;  Laterality: N/A;   KYPHOPLASTY N/A 10/10/2019   Procedure: L3 KYPHOPLASTY;  Surgeon: Hessie Knows, MD;  Location: [facility] ORS;  Service: Orthopedics;  Laterality: N/A;   open heart surgery  2019   valve replacement, aortic arch  aneurysm repair   TONSILLECTOMY     TUBAL LIGATION     WRIST RECONSTRUCTION Right    Patient Active Problem List   Diagnosis Date Noted   Femur fracture, right, closed, initial encounter 11/25/2015    ONSET DATE: 04/05/22  REFERRING DIAG: R26.0 (ICD-10-CM) - Ataxic  gait  THERAPY DIAG:  No diagnosis found.  Rationale for Evaluation and Treatment Rehabilitation   SUBJECTIVE:                                                                                                                                                                                             SUBJECTIVE STATEMENT: *** Pt reports she was tired after last visit and slept really well that night.  Pt notes that she is in a much better mood now than she was this morning.  She states that the  morning naps help her mood change.    Pt accompanied by: family member, daughter, [name].  PERTINENT HISTORY: Pt is referred to clinic for decreased balance.  Pt and daughter note that she has been doing exercises at home, although not as frequent.  Pt's daughter notes that the pt's endurance has declined and her ability to navigate curbs/stairs has declined as well.  MD and pt both agreed for second bout of therapy in order to return to PLOF.  PAIN:  Are you having pain? No  PRECAUTIONS: Fall  WEIGHT BEARING RESTRICTIONS No  FALLS: Has patient fallen in last 6 months? No  LIVING ENVIRONMENT: Lives with: lives alone Lives in: House/apartment Stairs: No Has following equipment at home: Gilford Rile - 4 wheeled; utilizes the furniture or walls at home.  PLOF: Independent with basic ADLs; utilizes the rollator in the morning until her vision and balance gets better in the morning.  PATIENT GOALS  Pt states that she would like to be able to go up 1-2 steps/curbs that she needs to navigate when visiting other people in the family/friends.  Pt wants to improve stamina and endurance when ambulating in order to be more functional at home.  Pt unable to use the rollator to get inside the door as much as she was prior.  3# weight limit with UE activities (apparently has been set by family members, not specifically MD)  OBJECTIVE:   DIAGNOSTIC FINDINGS: None  COGNITION: Overall cognitive status: Within  functional limits for tasks assessed   SENSATION: WFL  COORDINATION: WFL  POSTURE: rounded shoulders and forward head  LOWER EXTREMITY ROM:     WFL  LOWER EXTREMITY MMT:    MMT Right Eval Left Eval  Hip flexion 4 5  Hip abduction 4 4  Hip adduction 4 4  Knee flexion 4 4  Knee extension 5 5  Ankle dorsiflexion 4 4    GAIT: Gait pattern: step through pattern, trunk flexed, and wide BOS Assistive device utilized: None Level of assistance: SBA Comments: Pt with forward flexed posture during tests and measures necessary for goal assessments.  FUNCTIONAL TESTs:  5 times sit to stand: 18.59 sec Timed up and go (TUG): 18.42 sec 6 minute walk test: Not assessed during evaluation. 10 meter walk test: 14.63 sec Berg Balance Scale: Not assessed during evaluation Dynamic Gait Index: Not assessed during evaluation Functional gait assessment: Not assessed during evaluation FOTO: 44; Predicted 53   TODAY'S TREATMENT:   ***  Neuro  Foam ball toss against wall with catching and righting reaction, 5x10 throws/catches  Ambulation around the gym, no AD, 148' x3 laps for endurance  Dynamic standing on airex pad with weight shifts and reaching outside BOS while throwing darts at board, and cognitive task of adding score, 4 total rounds with one bag each round (10 throws each)  Crab walks along the airex balance beam, length of beam x4 each direction, modA necessary at times to prevent uncontrolled descent to the floor   PATIENT EDUCATION: Education details: Pt educated on role of PT and services provided during current POC, along with prognosis and information about the clinic. Person educated: Patient and Child(ren) Education method: Explanation Education comprehension: verbalized understanding   HOME EXERCISE PROGRAM:  Access Code: [email or web address] URL: [email or web address] Date: 05/03/2022 Prepared by: [place]  Exercises - Standing Hip Abduction with  Counter Support  - 1 x daily - 7 x weekly - 3 sets - 10 reps - Standing Hip Extension with Counter Support  -  1 x daily - 7 x weekly - 3 sets - 10 reps - Standing March with Counter Support  - 1 x daily - 7 x weekly - 3 sets - 10 reps - Heel Raises with Counter Support  - 1 x daily - 7 x weekly - 3 sets - 10 reps - Mini Squat with Counter Support  - 1 x daily - 7 x weekly - 3 sets - 10 reps - Seated Long Arc Quad  - 1 x daily - 7 x weekly - 3 sets - 10 reps    GOALS: Goals reviewed with patient? Yes  SHORT TERM GOALS: Target date: 06/29/2022  Pt will be independent with HEP in order to demonstrate increased ability to perform tasks related to occupation/hobbies. Baseline: Not given HEP at IE. Goal status: INITIAL   LONG TERM GOALS: Target date: 08/24/2022  1.  Patient (> 52 years old) will complete five times sit to stand test in < 15 seconds indicating an increased LE strength and improved balance. Baseline: 18.59 sec Goal status: INITIAL  2.  Patient will increase FOTO score to equal to or greater than 53 to demonstrate statistically significant improvement in mobility and quality of life.  Baseline: 44 Goal status: INITIAL   3.  Patient will increase Berg Balance score by > 6 points to demonstrate decreased fall risk during functional activities. Baseline: 46/56 Goal status: INITIAL   4.  Patient will reduce timed up and go to <11 seconds to reduce fall risk and demonstrate improved transfer/gait ability. Baseline: 18.42 Goal status: INITIAL  5.  Patient will increase 10 meter walk test to >1.79ms as to improve gait speed for better community ambulation and to reduce fall risk. Baseline: 0.68 m.s Goal status: INITIAL  6.  Patient will increase six minute walk test distance to >1000 for progression to community ambulator and improve gait ability Baseline: 565 ft, one standing rest break utilized at 5 min Goal status: INITIAL    ASSESSMENT:  CLINICAL  IMPRESSION: *** Pt performed well with exercises and was noted to be fatigued at the conclusion of the therapy session.  Pt continues to enjoy performing the dart toss and cognitive tasks that are associated with the tasks.  Pt is making significant improvements in proprioception and is able to handle the crab walks along the airex balance beam well.   Pt will continue to benefit from skilled therapy to address remaining deficits in order to improve overall QoL and return to PLOF.      OBJECTIVE IMPAIRMENTS Abnormal gait, decreased activity tolerance, decreased balance, decreased mobility, difficulty walking, and decreased strength.   ACTIVITY LIMITATIONS lifting, bending, squatting, stairs, transfers, and reach over head  PARTICIPATION LIMITATIONS: cleaning, laundry, community activity, and yard work  PRafael GonzalezAge, Past/current experiences, and Time since onset of injury/illness/exacerbation are also affecting patient's functional outcome.   REHAB POTENTIAL: Good  CLINICAL DECISION MAKING: Stable/uncomplicated  EVALUATION COMPLEXITY: Low   PLAN:  PT FREQUENCY: 2x/week  PT DURATION: 12 weeks  PLANNED INTERVENTIONS: Therapeutic exercises, Therapeutic activity, Neuromuscular re-education, Balance training, Gait training, Patient/Family education, Self Care, Joint mobilization, and Stair training  PLAN FOR NEXT SESSION:  *** Continue to improve LE strength and work on balance with task oriented items.   JGwenlyn Saran PT, DPT [facility]- [facility] 06/01/22, 9:24 AM

## 2022-06-06 ENCOUNTER — Ambulatory Visit: Payer: Medicare HMO

## 2022-06-06 DIAGNOSIS — R2681 Unsteadiness on feet: Secondary | ICD-10-CM

## 2022-06-06 DIAGNOSIS — R293 Abnormal posture: Secondary | ICD-10-CM

## 2022-06-06 DIAGNOSIS — R269 Unspecified abnormalities of gait and mobility: Secondary | ICD-10-CM

## 2022-06-06 DIAGNOSIS — R262 Difficulty in walking, not elsewhere classified: Secondary | ICD-10-CM

## 2022-06-06 DIAGNOSIS — M6281 Muscle weakness (generalized): Secondary | ICD-10-CM | POA: Diagnosis not present

## 2022-06-06 NOTE — Therapy (Signed)
OUTPATIENT PHYSICAL THERAPY TREATMENT NOTE/Physical Therapy Progress Note   Dates of reporting period  04/25/2022   to   06/06/2022   Patient Name: April Rogers MRN: [id number] DOB:June 30, 1941, 81 y.o., female Today's Date: 06/07/2022   PCP: Adin Hector, MD REFERRING PROVIDER: Adin Hector, MD   PT End of Session - 06/06/22 1256     Visit Number 10    Number of Visits 24    Date for PT Re-Evaluation 07/18/22    Authorization Type [employer]    Authorization Time Period 04/25/22-07/18/22    Authorization - Number of Visits 24    Progress Note Due on Visit 20    PT Start Time 1300    PT Stop Time 1344    PT Time Calculation (min) 44 min    Equipment Utilized During Treatment Gait belt    Activity Tolerance Patient tolerated treatment well    Behavior During Therapy WFL for tasks assessed/performed                 Past Medical History:  Diagnosis Date   Anxiety    Arthritis    Cancer ([place])    skin cancer   Complication of anesthesia    general anesthesia hard on memory after pt goes home   Depression    Fracture of one rib    GERD (gastroesophageal reflux disease)    was on nexium but no longer needs to take this medication   Hypertension    Macular degeneration of both eyes    Osteoporosis    Past Surgical History:  Procedure Laterality Date   ARM SKIN LESION BIOPSY / EXCISION Right    CHOLECYSTECTOMY     COLONOSCOPY WITH PROPOFOL N/A 08/03/2015   Procedure: COLONOSCOPY WITH PROPOFOL;  Surgeon: Hulen Luster, MD;  Location: [facility] ENDOSCOPY;  Service: Gastroenterology;  Laterality: N/A;   INTRAMEDULLARY (IM) NAIL INTERTROCHANTERIC Right 11/26/2015   Procedure: INTRAMEDULLARY (IM) NAIL INTERTROCHANTRIC;  Surgeon: Earnestine Leys, MD;  Location: [facility] ORS;  Service: Orthopedics;  Laterality: Right;   KYPHOPLASTY N/A 08/16/2019   Procedure: L4 KYPHOPLASTY;  Surgeon: Hessie Knows, MD;  Location: [facility] ORS;  Service: Orthopedics;  Laterality: N/A;    KYPHOPLASTY N/A 08/29/2019   Procedure: L2 KYPHOPLASTY;  Surgeon: Hessie Knows, MD;  Location: [facility] ORS;  Service: Orthopedics;  Laterality: N/A;   KYPHOPLASTY N/A 10/10/2019   Procedure: L3 KYPHOPLASTY;  Surgeon: Hessie Knows, MD;  Location: [facility] ORS;  Service: Orthopedics;  Laterality: N/A;   open heart surgery  2019   valve replacement, aortic arch  aneurysm repair   TONSILLECTOMY     TUBAL LIGATION     WRIST RECONSTRUCTION Right    Patient Active Problem List   Diagnosis Date Noted   Femur fracture, right, closed, initial encounter 11/25/2015    ONSET DATE: 04/05/22  REFERRING DIAG: R26.0 (ICD-10-CM) - Ataxic gait  THERAPY DIAG:  Difficulty in walking, not elsewhere classified  Muscle weakness (generalized)  Unsteadiness on feet  Abnormality of gait and mobility  Abnormal posture  Rationale for Evaluation and Treatment Rehabilitation   SUBJECTIVE:  SUBJECTIVE STATEMENT:  Pt reports doing well and states she had a good Thanksgiving weekend. Patient denies any falls or pain.    Pt accompanied by: family member, daughter, [name].  PERTINENT HISTORY: Pt is referred to clinic for decreased balance.  Pt and daughter note that she has been doing exercises at home, although not as frequent.  Pt's daughter notes that the pt's endurance has declined and her ability to navigate curbs/stairs has declined as well.  MD and pt both agreed for second bout of therapy in order to return to PLOF.  PAIN:  Are you having pain? No  PRECAUTIONS: Fall  WEIGHT BEARING RESTRICTIONS No  FALLS: Has patient fallen in last 6 months? No  LIVING ENVIRONMENT: Lives with: lives alone Lives in: House/apartment Stairs: No Has following equipment at home: Gilford Rile - 4 wheeled; utilizes the furniture or walls at  home.  PLOF: Independent with basic ADLs; utilizes the rollator in the morning until her vision and balance gets better in the morning.  PATIENT GOALS  Pt states that she would like to be able to go up 1-2 steps/curbs that she needs to navigate when visiting other people in the family/friends.  Pt wants to improve stamina and endurance when ambulating in order to be more functional at home.  Pt unable to use the rollator to get inside the door as much as she was prior.  3# weight limit with UE activities (apparently has been set by family members, not specifically MD)  OBJECTIVE:   DIAGNOSTIC FINDINGS: None  COGNITION: Overall cognitive status: Within functional limits for tasks assessed   SENSATION: WFL  COORDINATION: WFL  POSTURE: rounded shoulders and forward head  LOWER EXTREMITY ROM:     WFL  LOWER EXTREMITY MMT:    MMT Right Eval Left Eval  Hip flexion 4 5  Hip abduction 4 4  Hip adduction 4 4  Knee flexion 4 4  Knee extension 5 5  Ankle dorsiflexion 4 4    GAIT: Gait pattern: step through pattern, trunk flexed, and wide BOS Assistive device utilized: None Level of assistance: SBA Comments: Pt with forward flexed posture during tests and measures necessary for goal assessments.  FUNCTIONAL TESTs:  5 times sit to stand: 18.59 sec Timed up and go (TUG): 18.42 sec 6 minute walk test: Not assessed during evaluation. 10 meter walk test: 14.63 sec Berg Balance Scale: Not assessed during evaluation Dynamic Gait Index: Not assessed during evaluation Functional gait assessment: Not assessed during evaluation FOTO: 44; Predicted 53   TODAY'S TREATMENT:   Reassessed goals for progress note visit #10. See goal section for details of today's session.    PATIENT EDUCATION: Education details: Pt educated on purpose of testing for functional outcomes and discussed plan of care. Person educated: Patient and Child(ren) Education method: Explanation Education  comprehension: verbalized understanding   HOME EXERCISE PROGRAM:  Access Code: [email or web address] URL: [email or web address] Date: 05/03/2022 Prepared by: [place]  Exercises - Standing Hip Abduction with Counter Support  - 1 x daily - 7 x weekly - 3 sets - 10 reps - Standing Hip Extension with Counter Support  - 1 x daily - 7 x weekly - 3 sets - 10 reps - Standing March with Counter Support  - 1 x daily - 7 x weekly - 3 sets - 10 reps - Heel Raises with Counter Support  - 1 x daily - 7 x weekly - 3 sets - 10 reps - Mini Squat with Counter Support  -  1 x daily - 7 x weekly - 3 sets - 10 reps - Seated Long Arc Quad  - 1 x daily - 7 x weekly - 3 sets - 10 reps    GOALS: Goals reviewed with patient? Yes  SHORT TERM GOALS: Target date: 07/05/2022  Pt will be independent with HEP in order to demonstrate increased ability to perform tasks related to occupation/hobbies. Baseline: Not given HEP at IE. Goal status: INITIAL   LONG TERM GOALS: Target date: 08/30/2022  1.  Patient (> 91 years old) will complete five times sit to stand test in < 15 seconds indicating an increased LE strength and improved balance. Baseline: 18.59 sec; 06/06/2022= 13.55 sec  Goal status: GOAL MET  2.  Patient will increase FOTO score to equal to or greater than 53 to demonstrate statistically significant improvement in mobility and quality of life.  Baseline: 44; 06/06/2022= 51 Goal status: PROGRESSING   3.  Patient will increase Berg Balance score by > 6 points to demonstrate decreased fall risk during functional activities. Baseline: 46/56; 06/06/2022= 47/56 (however did not assess steps so likely higher and will need to complete next visit)  Goal status: PROGRESSING   4.  Patient will reduce timed up and go to <11 seconds to reduce fall risk and demonstrate improved transfer/gait ability. Baseline: 18.42; 06/06/2022= 17.39 sec without a device.  Goal status: Progressing   5.  Patient will  increase 10 meter walk test to >1.24ms as to improve gait speed for better community ambulation and to reduce fall risk. Baseline: 0.68 m.s Goal status: INITIAL  6.  Patient will increase six minute walk test distance to >1000 for progression to community ambulator and improve gait ability Baseline: 565 ft, one standing rest break utilized at 5 min; 06/06/22= 615 feet Goal status: PROGRESSING    ASSESSMENT:  CLINICAL IMPRESSION:  Pt presents with excellent motivation and agreeable to testing for progress note visit. She performed well with all testing today. She actually met her functional LE strength goal with significant improvement in 5 x STS. She also made progress with functional endurance as seen by 6 min walk test. She demonstrated improved TUG score decreasing her fall risk and improved in BERG balance despite not completing today-  47/56 (however did not assess steps so likely higher and will need to complete next visit) Patient's condition has the potential to improve in response to therapy. Maximum improvement is yet to be obtained. The anticipated improvement is attainable and reasonable in a generally predictable time. Pt will continue to benefit from skilled therapy to address remaining deficits in order to improve overall QoL and return to PLOF.      OBJECTIVE IMPAIRMENTS Abnormal gait, decreased activity tolerance, decreased balance, decreased mobility, difficulty walking, and decreased strength.   ACTIVITY LIMITATIONS lifting, bending, squatting, stairs, transfers, and reach over head  PARTICIPATION LIMITATIONS: cleaning, laundry, community activity, and yard work  [place], Past/current experiences, and Time since onset of injury/illness/exacerbation are also affecting patient's functional outcome.   REHAB POTENTIAL: Good  CLINICAL DECISION MAKING: Stable/uncomplicated  EVALUATION COMPLEXITY: Low  PLAN: PT FREQUENCY: 2x/week  PT DURATION: 12  weeks  PLANNED INTERVENTIONS: Therapeutic exercises, Therapeutic activity, Neuromuscular re-education, Balance training, Gait training, Patient/Family education, Self Care, Joint mobilization, and Stair training  PLAN FOR NEXT SESSION:   Continue to improve LE strength and work on balance with task oriented items.  JOllen Bowl PT Physical Therapist- [facility] 06/07/22, 6:51  AM

## 2022-06-08 ENCOUNTER — Ambulatory Visit: Payer: Medicare HMO

## 2022-06-08 DIAGNOSIS — R293 Abnormal posture: Secondary | ICD-10-CM

## 2022-06-08 DIAGNOSIS — M6281 Muscle weakness (generalized): Secondary | ICD-10-CM

## 2022-06-08 DIAGNOSIS — R269 Unspecified abnormalities of gait and mobility: Secondary | ICD-10-CM | POA: Diagnosis not present

## 2022-06-08 DIAGNOSIS — R262 Difficulty in walking, not elsewhere classified: Secondary | ICD-10-CM

## 2022-06-08 DIAGNOSIS — R2681 Unsteadiness on feet: Secondary | ICD-10-CM | POA: Diagnosis not present

## 2022-06-08 NOTE — Therapy (Signed)
OUTPATIENT PHYSICAL THERAPY TREATMENT NOTE   Patient Name: April Rogers MRN: [id number] DOB:08/22/1940, 81 y.o., female Today's Date: 06/08/2022   PCP: Adin Hector, MD REFERRING PROVIDER: Adin Hector, MD   PT End of Session - 06/08/22 1436     Visit Number 11    Number of Visits 24    Date for PT Re-Evaluation 07/18/22    Authorization Type [employer]    Authorization Time Period 04/25/22-07/18/22    Authorization - Number of Visits 24    Progress Note Due on Visit 20    PT Start Time 1432    PT Stop Time 1515    PT Time Calculation (min) 43 min    Equipment Utilized During Treatment Gait belt    Activity Tolerance Patient tolerated treatment well    Behavior During Therapy WFL for tasks assessed/performed              Past Medical History:  Diagnosis Date   Anxiety    Arthritis    Cancer ([place])    skin cancer   Complication of anesthesia    general anesthesia hard on memory after pt goes home   Depression    Fracture of one rib    GERD (gastroesophageal reflux disease)    was on nexium but no longer needs to take this medication   Hypertension    Macular degeneration of both eyes    Osteoporosis    Past Surgical History:  Procedure Laterality Date   ARM SKIN LESION BIOPSY / EXCISION Right    CHOLECYSTECTOMY     COLONOSCOPY WITH PROPOFOL N/A 08/03/2015   Procedure: COLONOSCOPY WITH PROPOFOL;  Surgeon: Hulen Luster, MD;  Location: [facility] ENDOSCOPY;  Service: Gastroenterology;  Laterality: N/A;   INTRAMEDULLARY (IM) NAIL INTERTROCHANTERIC Right 11/26/2015   Procedure: INTRAMEDULLARY (IM) NAIL INTERTROCHANTRIC;  Surgeon: Earnestine Leys, MD;  Location: [facility] ORS;  Service: Orthopedics;  Laterality: Right;   KYPHOPLASTY N/A 08/16/2019   Procedure: L4 KYPHOPLASTY;  Surgeon: Hessie Knows, MD;  Location: [facility] ORS;  Service: Orthopedics;  Laterality: N/A;   KYPHOPLASTY N/A 08/29/2019   Procedure: L2 KYPHOPLASTY;  Surgeon: Hessie Knows, MD;  Location: [facility]  ORS;  Service: Orthopedics;  Laterality: N/A;   KYPHOPLASTY N/A 10/10/2019   Procedure: L3 KYPHOPLASTY;  Surgeon: Hessie Knows, MD;  Location: [facility] ORS;  Service: Orthopedics;  Laterality: N/A;   open heart surgery  2019   valve replacement, aortic arch  aneurysm repair   TONSILLECTOMY     TUBAL LIGATION     WRIST RECONSTRUCTION Right    Patient Active Problem List   Diagnosis Date Noted   Femur fracture, right, closed, initial encounter 11/25/2015    ONSET DATE: 04/05/22  REFERRING DIAG: R26.0 (ICD-10-CM) - Ataxic gait  THERAPY DIAG:  Difficulty in walking, not elsewhere classified  Muscle weakness (generalized)  Unsteadiness on feet  Abnormality of gait and mobility  Abnormal posture  Rationale for Evaluation and Treatment Rehabilitation   SUBJECTIVE:  SUBJECTIVE STATEMENT:  Pt reports she had a good Thanksgiving and assisted with filling me in about her MD appointment and the surgery that she's going to have in February.   Pt accompanied by: family member, daughter, Kathy.  PERTINENT HISTORY: Pt is referred to clinic for decreased balance.  Pt and daughter note that she has been doing exercises at home, although not as frequent.  Pt's daughter notes that the pt's endurance has declined and her ability to navigate curbs/stairs has declined as well.  MD and pt both agreed for second bout of therapy in order to return to PLOF.  PAIN:  Are you having pain? No  PRECAUTIONS: Fall  WEIGHT BEARING RESTRICTIONS No  FALLS: Has patient fallen in last 6 months? No  LIVING ENVIRONMENT: Lives with: lives alone Lives in: House/apartment Stairs: No Has following equipment at home: Walker - 4 wheeled; utilizes the furniture or walls at home.  PLOF: Independent with basic ADLs; utilizes the  rollator in the morning until her vision and balance gets better in the morning.  PATIENT GOALS  Pt states that she would like to be able to go up 1-2 steps/curbs that she needs to navigate when visiting other people in the family/friends.  Pt wants to improve stamina and endurance when ambulating in order to be more functional at home.  Pt unable to use the rollator to get inside the door as much as she was prior.  3# weight limit with UE activities (apparently has been set by family members, not specifically MD)  OBJECTIVE:   DIAGNOSTIC FINDINGS: None  COGNITION: Overall cognitive status: Within functional limits for tasks assessed   SENSATION: WFL  COORDINATION: WFL  POSTURE: rounded shoulders and forward head  LOWER EXTREMITY ROM:     WFL  LOWER EXTREMITY MMT:    MMT Right Eval Left Eval  Hip flexion 4 5  Hip abduction 4 4  Hip adduction 4 4  Knee flexion 4 4  Knee extension 5 5  Ankle dorsiflexion 4 4    GAIT: Gait pattern: step through pattern, trunk flexed, and wide BOS Assistive device utilized: None Level of assistance: SBA Comments: Pt with forward flexed posture during tests and measures necessary for goal assessments.  FUNCTIONAL TESTs:  5 times sit to stand: 18.59 sec Timed up and go (TUG): 18.42 sec 6 minute walk test: Not assessed during evaluation. 10 meter walk test: 14.63 sec Berg Balance Scale: Not assessed during evaluation Dynamic Gait Index: Not assessed during evaluation Functional gait assessment: Not assessed during evaluation FOTO: 44; Predicted 53   TODAY'S TREATMENT:   Neuro   Foam ball toss with therapist during lap around the gym, x1 lap tosses from the R side and x1 lap tosses from the L side  Ambulation around the gym, no AD, 148' x3 laps with animal recall  Standing cone taps placed in all directions in front of pt, in order to improve SLS, x10 each LE     PATIENT EDUCATION: Education details: Pt educated on purpose  of testing for functional outcomes and discussed plan of care. Person educated: Patient and Child(ren) Education method: Explanation Education comprehension: verbalized understanding   HOME EXERCISE PROGRAM:  Access Code: [email or web address] URL: [email or web address] Date: 05/03/2022 Prepared by: Josh   Exercises - Standing Hip Abduction with Counter Support  - 1 x daily - 7 x weekly - 3 sets - 10 reps - Standing Hip Extension with Counter Support  - 1 x daily - 7 x weekly -   3 sets - 10 reps - Standing March with Counter Support  - 1 x daily - 7 x weekly - 3 sets - 10 reps - Heel Raises with Counter Support  - 1 x daily - 7 x weekly - 3 sets - 10 reps - Mini Squat with Counter Support  - 1 x daily - 7 x weekly - 3 sets - 10 reps - Seated Long Arc Quad  - 1 x daily - 7 x weekly - 3 sets - 10 reps    GOALS: Goals reviewed with patient? Yes  SHORT TERM GOALS: Target date: 07/06/2022  Pt will be independent with HEP in order to demonstrate increased ability to perform tasks related to occupation/hobbies. Baseline: Not given HEP at IE. Goal status: INITIAL   LONG TERM GOALS: Target date: 08/31/2022  1.  Patient (> 60 years old) will complete five times sit to stand test in < 15 seconds indicating an increased LE strength and improved balance. Baseline: 18.59 sec; 06/06/2022= 13.55 sec  Goal status: GOAL MET  2.  Patient will increase FOTO score to equal to or greater than 53 to demonstrate statistically significant improvement in mobility and quality of life.  Baseline: 44; 06/06/2022= 51 Goal status: PROGRESSING   3.  Patient will increase Berg Balance score by > 6 points to demonstrate decreased fall risk during functional activities. Baseline: 46/56; 06/06/2022= 49/56 (however did not assess steps so likely higher and will need to complete next visit)  Goal status: PROGRESSING   4.  Patient will reduce timed up and go to <11 seconds to reduce fall risk and  demonstrate improved transfer/gait ability. Baseline: 18.42; 06/06/2022= 17.39 sec without a device.  Goal status: PROGRESSING   5.  Patient will increase 10 meter walk test to >1.0m/s as to improve gait speed for better community ambulation and to reduce fall risk. Baseline: 0.68 m.s Goal status: INITIAL  6.  Patient will increase six minute walk test distance to >1000 for progression to community ambulator and improve gait ability Baseline: 565 ft, one standing rest break utilized at 5 min; 06/06/22= 615 feet Goal status: PROGRESSING    ASSESSMENT:  CLINICAL IMPRESSION:  Pt performed well with all tasks given today and is performing well with her balance today.  The standing cone taps were difficult for her today, but she was eventually able to perform and only one episode of LOB noted.  Pt also experienced 2 episodes of LOB with the lateral ball toss/catch while ambulating around the gym.  Pt needed modA to prevent uncontrolled descent to the floor on those episodes of imbalance.   Pt will continue to benefit from skilled therapy to address remaining deficits in order to improve overall QoL and return to PLOF.       OBJECTIVE IMPAIRMENTS Abnormal gait, decreased activity tolerance, decreased balance, decreased mobility, difficulty walking, and decreased strength.   ACTIVITY LIMITATIONS lifting, bending, squatting, stairs, transfers, and reach over head  PARTICIPATION LIMITATIONS: cleaning, laundry, community activity, and yard work  PERSONAL FACTORS Age, Past/current experiences, and Time since onset of injury/illness/exacerbation are also affecting patient's functional outcome.   REHAB POTENTIAL: Good  CLINICAL DECISION MAKING: Stable/uncomplicated  EVALUATION COMPLEXITY: Low  PLAN: PT FREQUENCY: 2x/week  PT DURATION: 12 weeks  PLANNED INTERVENTIONS: Therapeutic exercises, Therapeutic activity, Neuromuscular re-education, Balance training, Gait training, Patient/Family  education, Self Care, Joint mobilization, and Stair training  PLAN FOR NEXT SESSION:   Continue to improve LE strength and work on balance with task   oriented items.    , PT, DPT Physical Therapist- [place]  [facility]  06/08/22, 4:23 PM  

## 2022-06-13 ENCOUNTER — Ambulatory Visit: Payer: Medicare HMO | Attending: Internal Medicine

## 2022-06-13 DIAGNOSIS — R269 Unspecified abnormalities of gait and mobility: Secondary | ICD-10-CM | POA: Insufficient documentation

## 2022-06-13 DIAGNOSIS — M6281 Muscle weakness (generalized): Secondary | ICD-10-CM | POA: Insufficient documentation

## 2022-06-13 DIAGNOSIS — R2681 Unsteadiness on feet: Secondary | ICD-10-CM | POA: Insufficient documentation

## 2022-06-13 DIAGNOSIS — R293 Abnormal posture: Secondary | ICD-10-CM | POA: Insufficient documentation

## 2022-06-13 DIAGNOSIS — R262 Difficulty in walking, not elsewhere classified: Secondary | ICD-10-CM | POA: Diagnosis not present

## 2022-06-13 NOTE — Therapy (Signed)
OUTPATIENT PHYSICAL THERAPY TREATMENT NOTE   Patient Name: April Rogers MRN: [id number] DOB:June 22, 1941, 81 y.o., female Today's Date: 06/13/2022   PCP: Adin Hector, MD REFERRING PROVIDER: Adin Hector, MD   PT End of Session - 06/13/22 1413     Visit Number 12    Number of Visits 24    Date for PT Re-Evaluation 07/18/22    Authorization Type [employer]    Authorization Time Period 04/25/22-07/18/22    Authorization - Number of Visits 24    Progress Note Due on Visit 20    PT Start Time 1345    PT Stop Time 1430    PT Time Calculation (min) 45 min    Equipment Utilized During Treatment Gait belt    Activity Tolerance Patient tolerated treatment well    Behavior During Therapy WFL for tasks assessed/performed              Past Medical History:  Diagnosis Date   Anxiety    Arthritis    Cancer ([place])    skin cancer   Complication of anesthesia    general anesthesia hard on memory after pt goes home   Depression    Fracture of one rib    GERD (gastroesophageal reflux disease)    was on nexium but no longer needs to take this medication   Hypertension    Macular degeneration of both eyes    Osteoporosis    Past Surgical History:  Procedure Laterality Date   ARM SKIN LESION BIOPSY / EXCISION Right    CHOLECYSTECTOMY     COLONOSCOPY WITH PROPOFOL N/A 08/03/2015   Procedure: COLONOSCOPY WITH PROPOFOL;  Surgeon: Hulen Luster, MD;  Location: [facility] ENDOSCOPY;  Service: Gastroenterology;  Laterality: N/A;   INTRAMEDULLARY (IM) NAIL INTERTROCHANTERIC Right 11/26/2015   Procedure: INTRAMEDULLARY (IM) NAIL INTERTROCHANTRIC;  Surgeon: Earnestine Leys, MD;  Location: [facility] ORS;  Service: Orthopedics;  Laterality: Right;   KYPHOPLASTY N/A 08/16/2019   Procedure: L4 KYPHOPLASTY;  Surgeon: Hessie Knows, MD;  Location: [facility] ORS;  Service: Orthopedics;  Laterality: N/A;   KYPHOPLASTY N/A 08/29/2019   Procedure: L2 KYPHOPLASTY;  Surgeon: Hessie Knows, MD;  Location: [facility] ORS;   Service: Orthopedics;  Laterality: N/A;   KYPHOPLASTY N/A 10/10/2019   Procedure: L3 KYPHOPLASTY;  Surgeon: Hessie Knows, MD;  Location: [facility] ORS;  Service: Orthopedics;  Laterality: N/A;   open heart surgery  2019   valve replacement, aortic arch  aneurysm repair   TONSILLECTOMY     TUBAL LIGATION     WRIST RECONSTRUCTION Right    Patient Active Problem List   Diagnosis Date Noted   Femur fracture, right, closed, initial encounter 11/25/2015    ONSET DATE: 04/05/22  REFERRING DIAG: R26.0 (ICD-10-CM) - Ataxic gait  THERAPY DIAG:  Difficulty in walking, not elsewhere classified  Muscle weakness (generalized)  Unsteadiness on feet  Abnormality of gait and mobility  Abnormal posture  Rationale for Evaluation and Treatment Rehabilitation   SUBJECTIVE:  SUBJECTIVE STATEMENT:  Pt reports she had a good Thanksgiving and assisted with filling me in about her MD appointment and the surgery that she's going to have in February.   Pt accompanied by: family member, daughter, [name].  PERTINENT HISTORY: Pt is referred to clinic for decreased balance.  Pt and daughter note that she has been doing exercises at home, although not as frequent.  Pt's daughter notes that the pt's endurance has declined and her ability to navigate curbs/stairs has declined as well.  MD and pt both agreed for second bout of therapy in order to return to PLOF.  PAIN:  Are you having pain? No  PRECAUTIONS: Fall  WEIGHT BEARING RESTRICTIONS No  FALLS: Has patient fallen in last 6 months? No  LIVING ENVIRONMENT: Lives with: lives alone Lives in: House/apartment Stairs: No Has following equipment at home: Gilford Rile - 4 wheeled; utilizes the furniture or walls at home.  PLOF: Independent with basic ADLs; utilizes the  rollator in the morning until her vision and balance gets better in the morning.  PATIENT GOALS  Pt states that she would like to be able to go up 1-2 steps/curbs that she needs to navigate when visiting other people in the family/friends.  Pt wants to improve stamina and endurance when ambulating in order to be more functional at home.  Pt unable to use the rollator to get inside the door as much as she was prior.  3# weight limit with UE activities (apparently has been set by family members, not specifically MD)  OBJECTIVE:   DIAGNOSTIC FINDINGS: None  COGNITION: Overall cognitive status: Within functional limits for tasks assessed   SENSATION: WFL  COORDINATION: WFL  POSTURE: rounded shoulders and forward head  LOWER EXTREMITY ROM:     WFL  LOWER EXTREMITY MMT:    MMT Right Eval Left Eval  Hip flexion 4 5  Hip abduction 4 4  Hip adduction 4 4  Knee flexion 4 4  Knee extension 5 5  Ankle dorsiflexion 4 4    GAIT: Gait pattern: step through pattern, trunk flexed, and wide BOS Assistive device utilized: None Level of assistance: SBA Comments: Pt with forward flexed posture during tests and measures necessary for goal assessments.  FUNCTIONAL TESTs:  5 times sit to stand: 18.59 sec Timed up and go (TUG): 18.42 sec 6 minute walk test: Not assessed during evaluation. 10 meter walk test: 14.63 sec Berg Balance Scale: Not assessed during evaluation Dynamic Gait Index: Not assessed during evaluation Functional gait assessment: Not assessed during evaluation FOTO: 44; Predicted 53   TODAY'S TREATMENT:   Neuro  Blazepod Random activity with 6 pods utilized, multiple attempts for 45 sec stints with 15 sec rest periods, all while standing on airex pad, x4 bouts total.     Ambulation around the clinic without AD and focus on increased step length and improved SLS   PATIENT EDUCATION: Education details: Pt educated on purpose of testing for functional outcomes  and discussed plan of care. Person educated: Patient and Child(ren) Education method: Explanation Education comprehension: verbalized understanding   HOME EXERCISE PROGRAM:  Access Code: [email or web address] URL: [email or web address] Date: 05/03/2022 Prepared by: Hulbert Nation  Exercises - Standing Hip Abduction with Counter Support  - 1 x daily - 7 x weekly - 3 sets - 10 reps - Standing Hip Extension with Counter Support  - 1 x daily - 7 x weekly - 3 sets - 10 reps - Standing March with Counter Support  - 1 x  daily - 7 x weekly - 3 sets - 10 reps - Heel Raises with Counter Support  - 1 x daily - 7 x weekly - 3 sets - 10 reps - Mini Squat with Counter Support  - 1 x daily - 7 x weekly - 3 sets - 10 reps - Seated Long Arc Quad  - 1 x daily - 7 x weekly - 3 sets - 10 reps    GOALS: Goals reviewed with patient? Yes  SHORT TERM GOALS: Target date: 07/11/2022  Pt will be independent with HEP in order to demonstrate increased ability to perform tasks related to occupation/hobbies. Baseline: Not given HEP at IE. Goal status: INITIAL   LONG TERM GOALS: Target date: 09/05/2022  1.  Patient (> 50 years old) will complete five times sit to stand test in < 15 seconds indicating an increased LE strength and improved balance. Baseline: 18.59 sec; 06/06/2022= 13.55 sec  Goal status: GOAL MET  2.  Patient will increase FOTO score to equal to or greater than 53 to demonstrate statistically significant improvement in mobility and quality of life.  Baseline: 44; 06/06/2022= 51 Goal status: PROGRESSING   3.  Patient will increase Berg Balance score by > 6 points to demonstrate decreased fall risk during functional activities. Baseline: 46/56; 06/06/2022= 49/56 (however did not assess steps so likely higher and will need to complete next visit)  Goal status: PROGRESSING   4.  Patient will reduce timed up and go to <11 seconds to reduce fall risk and demonstrate improved transfer/gait  ability. Baseline: 18.42; 06/06/2022= 17.39 sec without a device.  Goal status: PROGRESSING   5.  Patient will increase 10 meter walk test to >1.56ms as to improve gait speed for better community ambulation and to reduce fall risk. Baseline: 0.68 m.s Goal status: INITIAL  6.  Patient will increase six minute walk test distance to >1000 for progression to community ambulator and improve gait ability Baseline: 565 ft, one standing rest break utilized at 5 min; 06/06/22= 615 feet Goal status: PROGRESSING    ASSESSMENT:  CLINICAL IMPRESSION:  Pt performed much better with all exercises and really enjoyed using the blazepods in order to practice improved reaction time and balance training.  Pt forced to reach out of cone of support and performed well with quick taps.  Pt averaged 792 on the reaction time and was consistently making improvements with each subsequent attempt.  Pt able to clearly see the lights and understand the task at hand while performing.   Pt will continue to benefit from skilled therapy to address remaining deficits in order to improve overall QoL and return to PLOF.        OBJECTIVE IMPAIRMENTS Abnormal gait, decreased activity tolerance, decreased balance, decreased mobility, difficulty walking, and decreased strength.   ACTIVITY LIMITATIONS lifting, bending, squatting, stairs, transfers, and reach over head  PARTICIPATION LIMITATIONS: cleaning, laundry, community activity, and yard work  [place], Past/current experiences, and Time since onset of injury/illness/exacerbation are also affecting patient's functional outcome.   REHAB POTENTIAL: Good  CLINICAL DECISION MAKING: Stable/uncomplicated  EVALUATION COMPLEXITY: Low  PLAN: PT FREQUENCY: 2x/week  PT DURATION: 12 weeks  PLANNED INTERVENTIONS: Therapeutic exercises, Therapeutic activity, Neuromuscular re-education, Balance training, Gait training, Patient/Family education, Self Care, Joint  mobilization, and Stair training  PLAN FOR NEXT SESSION:   Continue to improve LE strength and work on balance with task oriented items.   JGwenlyn Saran PT, DPT Physical Therapist- [facility]  06/13/22, 2:14 PM

## 2022-06-14 NOTE — Therapy (Signed)
OUTPATIENT PHYSICAL THERAPY TREATMENT NOTE   Patient Name: April Rogers MRN: [id number] DOB:[birth year], 81 y.o., female Today's Date: 06/15/2022   PCP: Adin Hector, MD REFERRING PROVIDER: Adin Hector, MD   PT End of Session - 06/15/22 1425     Visit Number 13    Number of Visits 24    Date for PT Re-Evaluation 07/18/22    Authorization Type [employer]    Authorization Time Period 04/25/22-07/18/22    Authorization - Number of Visits 24    Progress Note Due on Visit 20    PT Start Time 1427    PT Stop Time 1515    PT Time Calculation (min) 48 min    Equipment Utilized During Treatment Gait belt    Activity Tolerance Patient tolerated treatment well    Behavior During Therapy WFL for tasks assessed/performed               Past Medical History:  Diagnosis Date   Anxiety    Arthritis    Cancer ([place])    skin cancer   Complication of anesthesia    general anesthesia hard on memory after pt goes home   Depression    Fracture of one rib    GERD (gastroesophageal reflux disease)    was on nexium but no longer needs to take this medication   Hypertension    Macular degeneration of both eyes    Osteoporosis    Past Surgical History:  Procedure Laterality Date   ARM SKIN LESION BIOPSY / EXCISION Right    CHOLECYSTECTOMY     COLONOSCOPY WITH PROPOFOL N/A 08/03/2015   Procedure: COLONOSCOPY WITH PROPOFOL;  Surgeon: Hulen Luster, MD;  Location: [facility] ENDOSCOPY;  Service: Gastroenterology;  Laterality: N/A;   INTRAMEDULLARY (IM) NAIL INTERTROCHANTERIC Right 11/26/2015   Procedure: INTRAMEDULLARY (IM) NAIL INTERTROCHANTRIC;  Surgeon: Earnestine Leys, MD;  Location: [facility] ORS;  Service: Orthopedics;  Laterality: Right;   KYPHOPLASTY N/A 08/16/2019   Procedure: L4 KYPHOPLASTY;  Surgeon: Hessie Knows, MD;  Location: [facility] ORS;  Service: Orthopedics;  Laterality: N/A;   KYPHOPLASTY N/A 08/29/2019   Procedure: L2 KYPHOPLASTY;  Surgeon: Hessie Knows, MD;  Location: [facility]  ORS;  Service: Orthopedics;  Laterality: N/A;   KYPHOPLASTY N/A 10/10/2019   Procedure: L3 KYPHOPLASTY;  Surgeon: Hessie Knows, MD;  Location: [facility] ORS;  Service: Orthopedics;  Laterality: N/A;   open heart surgery  2019   valve replacement, aortic arch  aneurysm repair   TONSILLECTOMY     TUBAL LIGATION     WRIST RECONSTRUCTION Right    Patient Active Problem List   Diagnosis Date Noted   Femur fracture, right, closed, initial encounter 11/25/2015    ONSET DATE: 04/05/22  REFERRING DIAG: R26.0 (ICD-10-CM) - Ataxic gait  THERAPY DIAG:  Difficulty in walking, not elsewhere classified  Muscle weakness (generalized)  Unsteadiness on feet  Abnormality of gait and mobility  Abnormal posture  Rationale for Evaluation and Treatment Rehabilitation   SUBJECTIVE:  SUBJECTIVE STATEMENT:  Pt reports she is trying to decorate at home.  Pt has been taking breaks in between bouts to preserve energy.  Pt accompanied by: family member, daughter, Altha Harm.  PERTINENT HISTORY: Pt is referred to clinic for decreased balance.  Pt and daughter note that she has been doing exercises at home, although not as frequent.  Pt's daughter notes that the pt's endurance has declined and her ability to navigate curbs/stairs has declined as well.  MD and pt both agreed for second bout of therapy in order to return to PLOF.  PAIN:  Are you having pain? No  PRECAUTIONS: Fall  WEIGHT BEARING RESTRICTIONS No  FALLS: Has patient fallen in last 6 months? No  LIVING ENVIRONMENT: Lives with: lives alone Lives in: House/apartment Stairs: No Has following equipment at home: Gilford Rile - 4 wheeled; utilizes the furniture or walls at home.  PLOF: Independent with basic ADLs; utilizes the rollator in the morning until her  vision and balance gets better in the morning.  PATIENT GOALS  Pt states that she would like to be able to go up 1-2 steps/curbs that she needs to navigate when visiting other people in the family/friends.  Pt wants to improve stamina and endurance when ambulating in order to be more functional at home.  Pt unable to use the rollator to get inside the door as much as she was prior.  3# weight limit with UE activities (apparently has been set by family members, not specifically MD)  OBJECTIVE:   DIAGNOSTIC FINDINGS: None  COGNITION: Overall cognitive status: Within functional limits for tasks assessed   SENSATION: WFL  COORDINATION: WFL  POSTURE: rounded shoulders and forward head  LOWER EXTREMITY ROM:     WFL  LOWER EXTREMITY MMT:    MMT Right Eval Left Eval  Hip flexion 4 5  Hip abduction 4 4  Hip adduction 4 4  Knee flexion 4 4  Knee extension 5 5  Ankle dorsiflexion 4 4    GAIT: Gait pattern: step through pattern, trunk flexed, and wide BOS Assistive device utilized: None Level of assistance: SBA Comments: Pt with forward flexed posture during tests and measures necessary for goal assessments.  FUNCTIONAL TESTs:  5 times sit to stand: 18.59 sec Timed up and go (TUG): 18.42 sec 6 minute walk test: Not assessed during evaluation. 10 meter walk test: 14.63 sec Berg Balance Scale: Not assessed during evaluation Dynamic Gait Index: Not assessed during evaluation Functional gait assessment: Not assessed during evaluation FOTO: 44; Predicted 53   TODAY'S TREATMENT:    TherEx:  Seated hip adduction into yellow physioball, 2x10 with 3 sec holds Seated marches with 3# AW donned, 2x15 each LE Seated LAQ with 3# AW donned, 2x10 each LE Seated heel raises with 3# AW donned, 2x10 each Le   Neuro  Blazepod Random activity with 6 pods utilized, multiple attempts for 60 sec stints with 30 sec rest periods, all while standing on airex pad, x4 bouts  total. Blazepod Focus activity with 6 pods utilized, multiple attempts for 60 sec stints with 30 sec rest periods, all while standing on airex pad, x4 bouts total. Blazepod Sequence activity with 6 pods utilized, multiple attempts for 60 sec stints with 30 sec rest periods, all while standing on airex pad, x4 bouts total.  Ambulation around the clinic without AD and focus on increased step length and improved SLS  PATIENT EDUCATION: Education details: Pt educated on purpose of testing for functional outcomes and discussed plan  of care. Person educated: Patient and Child(ren) Education method: Explanation Education comprehension: verbalized understanding   HOME EXERCISE PROGRAM:  Access Code: [email or web address] URL: [email or web address] Date: 05/03/2022 Prepared by: [place]  Exercises - Standing Hip Abduction with Counter Support  - 1 x daily - 7 x weekly - 3 sets - 10 reps - Standing Hip Extension with Counter Support  - 1 x daily - 7 x weekly - 3 sets - 10 reps - Standing March with Counter Support  - 1 x daily - 7 x weekly - 3 sets - 10 reps - Heel Raises with Counter Support  - 1 x daily - 7 x weekly - 3 sets - 10 reps - Mini Squat with Counter Support  - 1 x daily - 7 x weekly - 3 sets - 10 reps - Seated Long Arc Quad  - 1 x daily - 7 x weekly - 3 sets - 10 reps    GOALS: Goals reviewed with patient? Yes  SHORT TERM GOALS: Target date: 07/13/2022  Pt will be independent with HEP in order to demonstrate increased ability to perform tasks related to occupation/hobbies. Baseline: Not given HEP at IE. Goal status: INITIAL   LONG TERM GOALS: Target date: 09/07/2022  1.  Patient (> 78 years old) will complete five times sit to stand test in < 15 seconds indicating an increased LE strength and improved balance. Baseline: 18.59 sec; 06/06/2022= 13.55 sec  Goal status: GOAL MET  2.  Patient will increase FOTO score to equal to or greater than 53 to demonstrate  statistically significant improvement in mobility and quality of life.  Baseline: 44; 06/06/2022= 51 Goal status: PROGRESSING   3.  Patient will increase Berg Balance score by > 6 points to demonstrate decreased fall risk during functional activities. Baseline: 46/56; 06/06/2022= 49/56 (however did not assess steps so likely higher and will need to complete next visit)  Goal status: PROGRESSING   4.  Patient will reduce timed up and go to <11 seconds to reduce fall risk and demonstrate improved transfer/gait ability. Baseline: 18.42; 06/06/2022= 17.39 sec without a device.  Goal status: PROGRESSING   5.  Patient will increase 10 meter walk test to >1.4ms as to improve gait speed for better community ambulation and to reduce fall risk. Baseline: 0.68 m.s Goal status: INITIAL  6.  Patient will increase six minute walk test distance to >1000 for progression to community ambulator and improve gait ability Baseline: 565 ft, one standing rest break utilized at 5 min; 06/06/22= 615 feet Goal status: PROGRESSING    ASSESSMENT:  CLINICAL IMPRESSION:  Pt performed well with the seated exercises and continues to perform well and put forth great effort with all tasks.  Pt continues to benefit from the B[place]hexercises and enjoys doing them.  Pt does need close CGA and at times minA to prevent falls when not utilizing the AD during the balance training, but is overall doing well with her progression towards goals.  . Pt will continue to benefit from skilled therapy to address remaining deficits in order to improve overall QoL and return to PLOF.      OBJECTIVE IMPAIRMENTS Abnormal gait, decreased activity tolerance, decreased balance, decreased mobility, difficulty walking, and decreased strength.   ACTIVITY LIMITATIONS lifting, bending, squatting, stairs, transfers, and reach over head  PARTICIPATION LIMITATIONS: cleaning, laundry, community activity, and yard work  [place],  Past/current experiences, and Time since onset of injury/illness/exacerbation are also affecting patient's functional outcome.  REHAB POTENTIAL: Good  CLINICAL DECISION MAKING: Stable/uncomplicated  EVALUATION COMPLEXITY: Low  PLAN: PT FREQUENCY: 2x/week  PT DURATION: 12 weeks  PLANNED INTERVENTIONS: Therapeutic exercises, Therapeutic activity, Neuromuscular re-education, Balance training, Gait training, Patient/Family education, Self Care, Joint mobilization, and Stair training  PLAN FOR NEXT SESSION:   Continue to improve LE strength and work on balance with task oriented items.   Gwenlyn Saran, PT, DPT Physical Therapist- [facility]  06/15/22, 4:15 PM

## 2022-06-15 ENCOUNTER — Ambulatory Visit: Payer: Medicare HMO

## 2022-06-15 DIAGNOSIS — R262 Difficulty in walking, not elsewhere classified: Secondary | ICD-10-CM | POA: Diagnosis not present

## 2022-06-15 DIAGNOSIS — M6281 Muscle weakness (generalized): Secondary | ICD-10-CM | POA: Diagnosis not present

## 2022-06-15 DIAGNOSIS — R2681 Unsteadiness on feet: Secondary | ICD-10-CM

## 2022-06-15 DIAGNOSIS — R293 Abnormal posture: Secondary | ICD-10-CM | POA: Diagnosis not present

## 2022-06-15 DIAGNOSIS — R269 Unspecified abnormalities of gait and mobility: Secondary | ICD-10-CM

## 2022-06-20 ENCOUNTER — Ambulatory Visit: Payer: Medicare HMO

## 2022-06-20 DIAGNOSIS — M6281 Muscle weakness (generalized): Secondary | ICD-10-CM | POA: Diagnosis not present

## 2022-06-20 DIAGNOSIS — R293 Abnormal posture: Secondary | ICD-10-CM

## 2022-06-20 DIAGNOSIS — R262 Difficulty in walking, not elsewhere classified: Secondary | ICD-10-CM | POA: Diagnosis not present

## 2022-06-20 DIAGNOSIS — R2681 Unsteadiness on feet: Secondary | ICD-10-CM

## 2022-06-20 DIAGNOSIS — R269 Unspecified abnormalities of gait and mobility: Secondary | ICD-10-CM | POA: Diagnosis not present

## 2022-06-20 NOTE — Therapy (Signed)
OUTPATIENT PHYSICAL THERAPY TREATMENT NOTE   Patient Name: April Rogers MRN: [id number] DOB:1941-04-12, 81 y.o., female Today's Date: 06/20/2022   PCP: Adin Hector, MD REFERRING PROVIDER: Adin Hector, MD   PT End of Session - 06/20/22 1529     Visit Number 14    Number of Visits 24    Date for PT Re-Evaluation 07/18/22    Authorization Type [employer]    Authorization Time Period 04/25/22-07/18/22    Authorization - Number of Visits 24    Progress Note Due on Visit 20    PT Start Time 1516    PT Stop Time 1600    PT Time Calculation (min) 44 min    Equipment Utilized During Treatment Gait belt    Activity Tolerance Patient tolerated treatment well    Behavior During Therapy WFL for tasks assessed/performed                Past Medical History:  Diagnosis Date   Anxiety    Arthritis    Cancer ([place])    skin cancer   Complication of anesthesia    general anesthesia hard on memory after pt goes home   Depression    Fracture of one rib    GERD (gastroesophageal reflux disease)    was on nexium but no longer needs to take this medication   Hypertension    Macular degeneration of both eyes    Osteoporosis    Past Surgical History:  Procedure Laterality Date   ARM SKIN LESION BIOPSY / EXCISION Right    CHOLECYSTECTOMY     COLONOSCOPY WITH PROPOFOL N/A 08/03/2015   Procedure: COLONOSCOPY WITH PROPOFOL;  Surgeon: Hulen Luster, MD;  Location: [facility] ENDOSCOPY;  Service: Gastroenterology;  Laterality: N/A;   INTRAMEDULLARY (IM) NAIL INTERTROCHANTERIC Right 11/26/2015   Procedure: INTRAMEDULLARY (IM) NAIL INTERTROCHANTRIC;  Surgeon: Earnestine Leys, MD;  Location: [facility] ORS;  Service: Orthopedics;  Laterality: Right;   KYPHOPLASTY N/A 08/16/2019   Procedure: L4 KYPHOPLASTY;  Surgeon: Hessie Knows, MD;  Location: [facility] ORS;  Service: Orthopedics;  Laterality: N/A;   KYPHOPLASTY N/A 08/29/2019   Procedure: L2 KYPHOPLASTY;  Surgeon: Hessie Knows, MD;  Location: [facility]  ORS;  Service: Orthopedics;  Laterality: N/A;   KYPHOPLASTY N/A 10/10/2019   Procedure: L3 KYPHOPLASTY;  Surgeon: Hessie Knows, MD;  Location: [facility] ORS;  Service: Orthopedics;  Laterality: N/A;   open heart surgery  2019   valve replacement, aortic arch  aneurysm repair   TONSILLECTOMY     TUBAL LIGATION     WRIST RECONSTRUCTION Right    Patient Active Problem List   Diagnosis Date Noted   Femur fracture, right, closed, initial encounter 11/25/2015    ONSET DATE: 04/05/22  REFERRING DIAG: R26.0 (ICD-10-CM) - Ataxic gait  THERAPY DIAG:  Difficulty in walking, not elsewhere classified  Muscle weakness (generalized)  Unsteadiness on feet  Abnormality of gait and mobility  Abnormal posture  Rationale for Evaluation and Treatment Rehabilitation   SUBJECTIVE:  SUBJECTIVE STATEMENT:  Pt reports her house is completely decorated and she is looking forward to moving on to the [place] Year due to having some difficulties the past two years.    Pt accompanied by: family member, daughter, April Rogers.  PERTINENT HISTORY: Pt is referred to clinic for decreased balance.  Pt and daughter note that she has been doing exercises at home, although not as frequent.  Pt's daughter notes that the pt's endurance has declined and her ability to navigate curbs/stairs has declined as well.  MD and pt both agreed for second bout of therapy in order to return to PLOF.  PAIN:  Are you having pain? No  PRECAUTIONS: Fall  WEIGHT BEARING RESTRICTIONS No  FALLS: Has patient fallen in last 6 months? No  LIVING ENVIRONMENT: Lives with: lives alone Lives in: House/apartment Stairs: No Has following equipment at home: Gilford Rile - 4 wheeled; utilizes the furniture or walls at home.  PLOF: Independent with basic ADLs;  utilizes the rollator in the morning until her vision and balance gets better in the morning.  PATIENT GOALS  Pt states that she would like to be able to go up 1-2 steps/curbs that she needs to navigate when visiting other people in the family/friends.  Pt wants to improve stamina and endurance when ambulating in order to be more functional at home.  Pt unable to use the rollator to get inside the door as much as she was prior.  3# weight limit with UE activities (apparently has been set by family members, not specifically MD)  OBJECTIVE:   DIAGNOSTIC FINDINGS: None  COGNITION: Overall cognitive status: Within functional limits for tasks assessed   SENSATION: WFL  COORDINATION: WFL  POSTURE: rounded shoulders and forward head  LOWER EXTREMITY ROM:     WFL  LOWER EXTREMITY MMT:    MMT Right Eval Left Eval  Hip flexion 4 5  Hip abduction 4 4  Hip adduction 4 4  Knee flexion 4 4  Knee extension 5 5  Ankle dorsiflexion 4 4    GAIT: Gait pattern: step through pattern, trunk flexed, and wide BOS Assistive device utilized: None Level of assistance: SBA Comments: Pt with forward flexed posture during tests and measures necessary for goal assessments.  FUNCTIONAL TESTs:  5 times sit to stand: 18.59 sec Timed up and go (TUG): 18.42 sec 6 minute walk test: Not assessed during evaluation. 10 meter walk test: 14.63 sec Berg Balance Scale: Not assessed during evaluation Dynamic Gait Index: Not assessed during evaluation Functional gait assessment: Not assessed during evaluation FOTO: 44; Predicted 53   TODAY'S TREATMENT:   Neuro  Blazepod Random activity with 6 pods utilized, multiple attempts for 60 sec stints with 30 sec rest periods, all while standing on airex pad, x4 bouts total. Blazepod Focus activity with 4 pods utilized, multiple attempts for 90 sec stints with 30 sec rest periods, all while standing on airex pad, x4 bouts total. Blazepod Sequence activity with  6 pods utilized, multiple attempts for 60 sec stints with 30 sec rest periods, all while standing on airex pad, x4 bouts total. Blazepod All at Once activity with 6 pods utilized, x2 cycles for each session for memory recall and 15 sec rest periods, 2 bouts total, with pt able to recall pods placed around the gym and decreased time to hit all 6 pods.  Ambulation around the clinic without AD and focus on increased step length and improved SLS Ambulation within the hallway, with backwards focus for improving  gait mechanics and posterior chain activation.    PATIENT EDUCATION: Education details: Pt educated on purpose of testing for functional outcomes and discussed plan of care. Person educated: Patient and Child(ren) Education method: Explanation Education comprehension: verbalized understanding   HOME EXERCISE PROGRAM:  Access Code: [email or web address] URL: [email or web address] Date: 05/03/2022 Prepared by: [place]  Exercises - Standing Hip Abduction with Counter Support  - 1 x daily - 7 x weekly - 3 sets - 10 reps - Standing Hip Extension with Counter Support  - 1 x daily - 7 x weekly - 3 sets - 10 reps - Standing March with Counter Support  - 1 x daily - 7 x weekly - 3 sets - 10 reps - Heel Raises with Counter Support  - 1 x daily - 7 x weekly - 3 sets - 10 reps - Mini Squat with Counter Support  - 1 x daily - 7 x weekly - 3 sets - 10 reps - Seated Long Arc Quad  - 1 x daily - 7 x weekly - 3 sets - 10 reps    GOALS: Goals reviewed with patient? Yes  SHORT TERM GOALS: Target date: 07/18/2022  Pt will be independent with HEP in order to demonstrate increased ability to perform tasks related to occupation/hobbies. Baseline: Not given HEP at IE. Goal status: INITIAL   LONG TERM GOALS: Target date: 09/12/2022  1.  Patient (> 46 years old) will complete five times sit to stand test in < 15 seconds indicating an increased LE strength and improved balance. Baseline: 18.59  sec; 06/06/2022= 13.55 sec  Goal status: GOAL MET  2.  Patient will increase FOTO score to equal to or greater than 53 to demonstrate statistically significant improvement in mobility and quality of life.  Baseline: 44; 06/06/2022= 51 Goal status: PROGRESSING   3.  Patient will increase Berg Balance score by > 6 points to demonstrate decreased fall risk during functional activities. Baseline: 46/56; 06/06/2022= 49/56 (however did not assess steps so likely higher and will need to complete next visit)  Goal status: PROGRESSING   4.  Patient will reduce timed up and go to <11 seconds to reduce fall risk and demonstrate improved transfer/gait ability. Baseline: 18.42; 06/06/2022= 17.39 sec without a device.  Goal status: PROGRESSING   5.  Patient will increase 10 meter walk test to >1.35ms as to improve gait speed for better community ambulation and to reduce fall risk. Baseline: 0.68 m.s Goal status: INITIAL  6.  Patient will increase six minute walk test distance to >1000 for progression to community ambulator and improve gait ability Baseline: 565 ft, one standing rest break utilized at 5 min; 06/06/22= 615 feet Goal status: PROGRESSING    ASSESSMENT:  CLINICAL IMPRESSION:  Pt continues to put forth great effort and perform well with the tasks given.  Pt challenged cognitive and memory recall with the use of the Blazepods today and was able to achieve good results and improved performance.  With the "All at Once" activity, pt was able to consistently decrease the amount of time it took to turn out all the lights which included them being placed on the stairs and on 6" steps that challenged the pt's single leg balance.  Pt noted to have one LOB which was corrected with minA from therapist to prevent uncontrolled descent.  Pt to continue to benefit from skilled therapy in order to address balance and strength deficits remaining.    OBJECTIVE IMPAIRMENTS Abnormal gait, decreased  activity tolerance,  decreased balance, decreased mobility, difficulty walking, and decreased strength.   ACTIVITY LIMITATIONS lifting, bending, squatting, stairs, transfers, and reach over head  PARTICIPATION LIMITATIONS: cleaning, laundry, community activity, and yard work  [place] Age, Past/current experiences, and Time since onset of injury/illness/exacerbation are also affecting patient's functional outcome.   REHAB POTENTIAL: Good  CLINICAL DECISION MAKING: Stable/uncomplicated  EVALUATION COMPLEXITY: Low  PLAN: PT FREQUENCY: 2x/week  PT DURATION: 12 weeks  PLANNED INTERVENTIONS: Therapeutic exercises, Therapeutic activity, Neuromuscular re-education, Balance training, Gait training, Patient/Family education, Self Care, Joint mobilization, and Stair training  PLAN FOR NEXT SESSION:   Continue to improve LE strength and work on balance with task oriented items.   Gwenlyn Saran, PT, DPT Physical Therapist- [facility]  06/20/22, 4:50 PM

## 2022-06-22 ENCOUNTER — Ambulatory Visit: Payer: Medicare HMO

## 2022-06-22 DIAGNOSIS — M6281 Muscle weakness (generalized): Secondary | ICD-10-CM | POA: Diagnosis not present

## 2022-06-22 DIAGNOSIS — R293 Abnormal posture: Secondary | ICD-10-CM | POA: Diagnosis not present

## 2022-06-22 DIAGNOSIS — R269 Unspecified abnormalities of gait and mobility: Secondary | ICD-10-CM | POA: Diagnosis not present

## 2022-06-22 DIAGNOSIS — R2681 Unsteadiness on feet: Secondary | ICD-10-CM

## 2022-06-22 DIAGNOSIS — R262 Difficulty in walking, not elsewhere classified: Secondary | ICD-10-CM | POA: Diagnosis not present

## 2022-06-22 NOTE — Therapy (Signed)
OUTPATIENT PHYSICAL THERAPY TREATMENT NOTE   Patient Name: April Rogers MRN: [id number] DOB:11-13-1940, 81 y.o., female Today's Date: 06/22/2022   PCP: Adin Hector, MD REFERRING PROVIDER: Adin Hector, MD   PT End of Session - 06/22/22 1431     Visit Number 15    Number of Visits 24    Date for PT Re-Evaluation 07/18/22    Authorization Type [employer]    Authorization Time Period 04/25/22-07/18/22    Authorization - Number of Visits 24    Progress Note Due on Visit 20    PT Start Time 1431    PT Stop Time 1515    PT Time Calculation (min) 44 min    Equipment Utilized During Treatment Gait belt    Activity Tolerance Patient tolerated treatment well    Behavior During Therapy WFL for tasks assessed/performed                Past Medical History:  Diagnosis Date   Anxiety    Arthritis    Cancer ([place])    skin cancer   Complication of anesthesia    general anesthesia hard on memory after pt goes home   Depression    Fracture of one rib    GERD (gastroesophageal reflux disease)    was on nexium but no longer needs to take this medication   Hypertension    Macular degeneration of both eyes    Osteoporosis    Past Surgical History:  Procedure Laterality Date   ARM SKIN LESION BIOPSY / EXCISION Right    CHOLECYSTECTOMY     COLONOSCOPY WITH PROPOFOL N/A 08/03/2015   Procedure: COLONOSCOPY WITH PROPOFOL;  Surgeon: Hulen Luster, MD;  Location: [facility] ENDOSCOPY;  Service: Gastroenterology;  Laterality: N/A;   INTRAMEDULLARY (IM) NAIL INTERTROCHANTERIC Right 11/26/2015   Procedure: INTRAMEDULLARY (IM) NAIL INTERTROCHANTRIC;  Surgeon: Earnestine Leys, MD;  Location: [facility] ORS;  Service: Orthopedics;  Laterality: Right;   KYPHOPLASTY N/A 08/16/2019   Procedure: L4 KYPHOPLASTY;  Surgeon: Hessie Knows, MD;  Location: [facility] ORS;  Service: Orthopedics;  Laterality: N/A;   KYPHOPLASTY N/A 08/29/2019   Procedure: L2 KYPHOPLASTY;  Surgeon: Hessie Knows, MD;  Location: [facility]  ORS;  Service: Orthopedics;  Laterality: N/A;   KYPHOPLASTY N/A 10/10/2019   Procedure: L3 KYPHOPLASTY;  Surgeon: Hessie Knows, MD;  Location: [facility] ORS;  Service: Orthopedics;  Laterality: N/A;   open heart surgery  2019   valve replacement, aortic arch  aneurysm repair   TONSILLECTOMY     TUBAL LIGATION     WRIST RECONSTRUCTION Right    Patient Active Problem List   Diagnosis Date Noted   Femur fracture, right, closed, initial encounter 11/25/2015    ONSET DATE: 04/05/22  REFERRING DIAG: R26.0 (ICD-10-CM) - Ataxic gait  THERAPY DIAG:  Difficulty in walking, not elsewhere classified  Muscle weakness (generalized)  Unsteadiness on feet  Abnormality of gait and mobility  Abnormal posture  Rationale for Evaluation and Treatment Rehabilitation   SUBJECTIVE:  SUBJECTIVE STATEMENT:  Pt and daughter report she is having a good day today.  Pt has been active and motivated throughout the day to get things accomplished.   Pt accompanied by: family member, daughter, Altha Harm.  PERTINENT HISTORY: Pt is referred to clinic for decreased balance.  Pt and daughter note that she has been doing exercises at home, although not as frequent.  Pt's daughter notes that the pt's endurance has declined and her ability to navigate curbs/stairs has declined as well.  MD and pt both agreed for second bout of therapy in order to return to PLOF.  PAIN:  Are you having pain? No  PRECAUTIONS: Fall  WEIGHT BEARING RESTRICTIONS No  FALLS: Has patient fallen in last 6 months? No  LIVING ENVIRONMENT: Lives with: lives alone Lives in: House/apartment Stairs: No Has following equipment at home: Gilford Rile - 4 wheeled; utilizes the furniture or walls at home.  PLOF: Independent with basic ADLs; utilizes the rollator in  the morning until her vision and balance gets better in the morning.  PATIENT GOALS  Pt states that she would like to be able to go up 1-2 steps/curbs that she needs to navigate when visiting other people in the family/friends.  Pt wants to improve stamina and endurance when ambulating in order to be more functional at home.  Pt unable to use the rollator to get inside the door as much as she was prior.  3# weight limit with UE activities (apparently has been set by family members, not specifically MD)  OBJECTIVE:   DIAGNOSTIC FINDINGS: None  COGNITION: Overall cognitive status: Within functional limits for tasks assessed   SENSATION: WFL  COORDINATION: WFL  POSTURE: rounded shoulders and forward head  LOWER EXTREMITY ROM:     WFL  LOWER EXTREMITY MMT:    MMT Right Eval Left Eval  Hip flexion 4 5  Hip abduction 4 4  Hip adduction 4 4  Knee flexion 4 4  Knee extension 5 5  Ankle dorsiflexion 4 4    GAIT: Gait pattern: step through pattern, trunk flexed, and wide BOS Assistive device utilized: None Level of assistance: SBA Comments: Pt with forward flexed posture during tests and measures necessary for goal assessments.  FUNCTIONAL TESTs:  5 times sit to stand: 18.59 sec Timed up and go (TUG): 18.42 sec 6 minute walk test: Not assessed during evaluation. 10 meter walk test: 14.63 sec Berg Balance Scale: Not assessed during evaluation Dynamic Gait Index: Not assessed during evaluation Functional gait assessment: Not assessed during evaluation FOTO: 44; Predicted 64   TODAY'S TREATMENT:   TherEx  Seated LAQ, 3# AW donned, 2x15 Seated marches, 3# AW donned, 2x15 STS, 2x10   Neuro  Blazepod Random activity with 6 pods utilized, multiple attempts for 60 sec stints with 30 sec rest periods, all while standing on airex pad, x4 bouts total. Blazepod Focus activity with 4 pods utilized, multiple attempts for 90 sec stints with 30 sec rest periods, all while  standing on airex pad, x4 bouts total.  Dynamic reaching while standing/sidestepping on airex balance beam for letters for Hangman game at whiteboard.      PATIENT EDUCATION: Education details: Pt educated on purpose of testing for functional outcomes and discussed plan of care. Person educated: Patient and Child(ren) Education method: Explanation Education comprehension: verbalized understanding   HOME EXERCISE PROGRAM:  Access Code: [email or web address] URL: [email or web address] Date: 05/03/2022 Prepared by: [place]  Exercises - Standing Hip Abduction with Counter Support  - 1 x  daily - 7 x weekly - 3 sets - 10 reps - Standing Hip Extension with Counter Support  - 1 x daily - 7 x weekly - 3 sets - 10 reps - Standing March with Counter Support  - 1 x daily - 7 x weekly - 3 sets - 10 reps - Heel Raises with Counter Support  - 1 x daily - 7 x weekly - 3 sets - 10 reps - Mini Squat with Counter Support  - 1 x daily - 7 x weekly - 3 sets - 10 reps - Seated Long Arc Quad  - 1 x daily - 7 x weekly - 3 sets - 10 reps    GOALS: Goals reviewed with patient? Yes  SHORT TERM GOALS: Target date: 07/20/2022  Pt will be independent with HEP in order to demonstrate increased ability to perform tasks related to occupation/hobbies. Baseline: Not given HEP at IE. Goal status: INITIAL   LONG TERM GOALS: Target date: 09/14/2022  1.  Patient (> 90 years old) will complete five times sit to stand test in < 15 seconds indicating an increased LE strength and improved balance. Baseline: 18.59 sec; 06/06/2022= 13.55 sec  Goal status: GOAL MET  2.  Patient will increase FOTO score to equal to or greater than 53 to demonstrate statistically significant improvement in mobility and quality of life.  Baseline: 44; 06/06/2022= 51 Goal status: PROGRESSING   3.  Patient will increase Berg Balance score by > 6 points to demonstrate decreased fall risk during functional activities. Baseline:  46/56; 06/06/2022= 49/56 (however did not assess steps so likely higher and will need to complete next visit)  Goal status: PROGRESSING   4.  Patient will reduce timed up and go to <11 seconds to reduce fall risk and demonstrate improved transfer/gait ability. Baseline: 18.42; 06/06/2022= 17.39 sec without a device.  Goal status: PROGRESSING   5.  Patient will increase 10 meter walk test to >1.17ms as to improve gait speed for better community ambulation and to reduce fall risk. Baseline: 0.68 m.s Goal status: INITIAL  6.  Patient will increase six minute walk test distance to >1000 for progression to community ambulator and improve gait ability Baseline: 565 ft, one standing rest break utilized at 5 min; 06/06/22= 615 feet Goal status: PROGRESSING    ASSESSMENT:  CLINICAL IMPRESSION:  Pt continues to improve with the blazepod activities and also the neuro activities.  Pt notes to enjoy the activities as it challenges her and she only experiences one LOB that the therapy had to provide modA to prevent uncontrolled descent to the floor.  Pt otherwise is making good progress with her goals and is doing much better with her balance.   Pt will continue to benefit from skilled therapy to address remaining deficits in order to improve overall QoL and return to PLOF.       OBJECTIVE IMPAIRMENTS Abnormal gait, decreased activity tolerance, decreased balance, decreased mobility, difficulty walking, and decreased strength.   ACTIVITY LIMITATIONS lifting, bending, squatting, stairs, transfers, and reach over head  PARTICIPATION LIMITATIONS: cleaning, laundry, community activity, and yard work  [place] Past/current experiences, and Time since onset of injury/illness/exacerbation are also affecting patient's functional outcome.   REHAB POTENTIAL: Good  CLINICAL DECISION MAKING: Stable/uncomplicated  EVALUATION COMPLEXITY: Low  PLAN: PT FREQUENCY: 2x/week  PT DURATION: 12  weeks  PLANNED INTERVENTIONS: Therapeutic exercises, Therapeutic activity, Neuromuscular re-education, Balance training, Gait training, Patient/Family education, Self Care, Joint mobilization, and Stair training  PLAN  FOR NEXT SESSION:   Continue to improve LE strength and work on balance with task oriented items.   Gwenlyn Saran, PT, DPT Physical Therapist- [facility]  06/22/22, 5:58 PM

## 2022-06-27 ENCOUNTER — Ambulatory Visit: Payer: Medicare HMO

## 2022-06-27 DIAGNOSIS — R262 Difficulty in walking, not elsewhere classified: Secondary | ICD-10-CM | POA: Diagnosis not present

## 2022-06-27 DIAGNOSIS — R269 Unspecified abnormalities of gait and mobility: Secondary | ICD-10-CM

## 2022-06-27 DIAGNOSIS — R2681 Unsteadiness on feet: Secondary | ICD-10-CM | POA: Diagnosis not present

## 2022-06-27 DIAGNOSIS — M6281 Muscle weakness (generalized): Secondary | ICD-10-CM

## 2022-06-27 DIAGNOSIS — R293 Abnormal posture: Secondary | ICD-10-CM | POA: Diagnosis not present

## 2022-06-27 NOTE — Therapy (Signed)
OUTPATIENT PHYSICAL THERAPY TREATMENT NOTE   Patient Name: April Rogers MRN: [id number] DOB:July 24, 1940, 81 y.o., female Today's Date: 06/27/2022   PCP: Adin Hector, MD REFERRING PROVIDER: Adin Hector, MD   PT End of Session - 06/27/22 1515     Visit Number 16    Number of Visits 24    Date for PT Re-Evaluation 07/18/22    Authorization Type [employer]    Authorization Time Period 04/25/22-07/18/22    Authorization - Number of Visits 24    Progress Note Due on Visit 20    PT Start Time 1515    PT Stop Time 1600    PT Time Calculation (min) 45 min    Equipment Utilized During Treatment Gait belt    Activity Tolerance Patient tolerated treatment well    Behavior During Therapy WFL for tasks assessed/performed                Past Medical History:  Diagnosis Date   Anxiety    Arthritis    Cancer ([place])    skin cancer   Complication of anesthesia    general anesthesia hard on memory after pt goes home   Depression    Fracture of one rib    GERD (gastroesophageal reflux disease)    was on nexium but no longer needs to take this medication   Hypertension    Macular degeneration of both eyes    Osteoporosis    Past Surgical History:  Procedure Laterality Date   ARM SKIN LESION BIOPSY / EXCISION Right    CHOLECYSTECTOMY     COLONOSCOPY WITH PROPOFOL N/A 08/03/2015   Procedure: COLONOSCOPY WITH PROPOFOL;  Surgeon: Hulen Luster, MD;  Location: [facility] ENDOSCOPY;  Service: Gastroenterology;  Laterality: N/A;   INTRAMEDULLARY (IM) NAIL INTERTROCHANTERIC Right 11/26/2015   Procedure: INTRAMEDULLARY (IM) NAIL INTERTROCHANTRIC;  Surgeon: Earnestine Leys, MD;  Location: [facility] ORS;  Service: Orthopedics;  Laterality: Right;   KYPHOPLASTY N/A 08/16/2019   Procedure: L4 KYPHOPLASTY;  Surgeon: Hessie Knows, MD;  Location: [facility] ORS;  Service: Orthopedics;  Laterality: N/A;   KYPHOPLASTY N/A 08/29/2019   Procedure: L2 KYPHOPLASTY;  Surgeon: Hessie Knows, MD;  Location: [facility]  ORS;  Service: Orthopedics;  Laterality: N/A;   KYPHOPLASTY N/A 10/10/2019   Procedure: L3 KYPHOPLASTY;  Surgeon: Hessie Knows, MD;  Location: [facility] ORS;  Service: Orthopedics;  Laterality: N/A;   open heart surgery  2019   valve replacement, aortic arch  aneurysm repair   TONSILLECTOMY     TUBAL LIGATION     WRIST RECONSTRUCTION Right    Patient Active Problem List   Diagnosis Date Noted   Femur fracture, right, closed, initial encounter 11/25/2015    ONSET DATE: 04/05/22  REFERRING DIAG: R26.0 (ICD-10-CM) - Ataxic gait  THERAPY DIAG:  Difficulty in walking, not elsewhere classified  Muscle weakness (generalized)  Unsteadiness on feet  Abnormality of gait and mobility  Abnormal posture  Rationale for Evaluation and Treatment Rehabilitation   SUBJECTIVE:  SUBJECTIVE STATEMENT:  Pt's daughter is running [place] errands currently and will not be joining pt during treatment session.  Pt notes she had a good weekend and denies any falls or complications since last visit.     Pt accompanied by: family member, daughter, Altha Harm.  PERTINENT HISTORY: Pt is referred to clinic for decreased balance.  Pt and daughter note that she has been doing exercises at home, although not as frequent.  Pt's daughter notes that the pt's endurance has declined and her ability to navigate curbs/stairs has declined as well.  MD and pt both agreed for second bout of therapy in order to return to PLOF.  PAIN:  Are you having pain? No  PRECAUTIONS: Fall  WEIGHT BEARING RESTRICTIONS No  FALLS: Has patient fallen in last 6 months? No  LIVING ENVIRONMENT: Lives with: lives alone Lives in: House/apartment Stairs: No Has following equipment at home: Gilford Rile - 4 wheeled; utilizes the furniture or walls at  home.  PLOF: Independent with basic ADLs; utilizes the rollator in the morning until her vision and balance gets better in the morning.  PATIENT GOALS  Pt states that she would like to be able to go up 1-2 steps/curbs that she needs to navigate when visiting other people in the family/friends.  Pt wants to improve stamina and endurance when ambulating in order to be more functional at home.  Pt unable to use the rollator to get inside the door as much as she was prior.  3# weight limit with UE activities (apparently has been set by family members, not specifically MD)  OBJECTIVE:   DIAGNOSTIC FINDINGS: None  COGNITION: Overall cognitive status: Within functional limits for tasks assessed   SENSATION: WFL  COORDINATION: WFL  POSTURE: rounded shoulders and forward head  LOWER EXTREMITY ROM:     WFL  LOWER EXTREMITY MMT:    MMT Right Eval Left Eval  Hip flexion 4 5  Hip abduction 4 4  Hip adduction 4 4  Knee flexion 4 4  Knee extension 5 5  Ankle dorsiflexion 4 4    GAIT: Gait pattern: step through pattern, trunk flexed, and wide BOS Assistive device utilized: None Level of assistance: SBA Comments: Pt with forward flexed posture during tests and measures necessary for goal assessments.  FUNCTIONAL TESTs:  5 times sit to stand: 18.59 sec Timed up and go (TUG): 18.42 sec 6 minute walk test: Not assessed during evaluation. 10 meter walk test: 14.63 sec Berg Balance Scale: Not assessed during evaluation Dynamic Gait Index: Not assessed during evaluation Functional gait assessment: Not assessed during evaluation FOTO: 44; Predicted 53   TODAY'S TREATMENT:    TherEx:  NuStep, seat 8, arms 9, interval training for improved cardiovascular performance Level 1 - 1.5 min Level 2 - 1.5 min Level 3 - 1.5 min Level 2 - 1.5 min Level 1 - 1.5 min as cool-off period   Neuro  [employer], practice level, 6 min 23 sec, 14 sterling caught, stability level  3 Korebalance [place]ball, Levels I-V and Bonus Level 1, stability level 3    PATIENT EDUCATION: Education details: Pt educated on purpose of testing for functional outcomes and discussed plan of care. Person educated: Patient and Child(ren) Education method: Explanation Education comprehension: verbalized understanding   HOME EXERCISE PROGRAM:  Access Code: [email or web address] URL: [email or web address] Date: 05/03/2022 Prepared by: [place] [employer]  Exercises - Standing Hip Abduction with Counter Support  - 1 x daily - 7 x weekly - 3 sets - 10  reps - Standing Hip Extension with Counter Support  - 1 x daily - 7 x weekly - 3 sets - 10 reps - Standing March with Counter Support  - 1 x daily - 7 x weekly - 3 sets - 10 reps - Heel Raises with Counter Support  - 1 x daily - 7 x weekly - 3 sets - 10 reps - Mini Squat with Counter Support  - 1 x daily - 7 x weekly - 3 sets - 10 reps - Seated Long Arc Quad  - 1 x daily - 7 x weekly - 3 sets - 10 reps    GOALS: Goals reviewed with patient? Yes  SHORT TERM GOALS: Target date: 07/25/2022  Pt will be independent with HEP in order to demonstrate increased ability to perform tasks related to occupation/hobbies. Baseline: Not given HEP at IE. Goal status: INITIAL   LONG TERM GOALS: Target date: 09/19/2022  1.  Patient (> 60 years old) will complete five times sit to stand test in < 15 seconds indicating an increased LE strength and improved balance. Baseline: 18.59 sec; 06/06/2022= 13.55 sec  Goal status: GOAL MET  2.  Patient will increase FOTO score to equal to or greater than 53 to demonstrate statistically significant improvement in mobility and quality of life.  Baseline: 44; 06/06/2022= 51 Goal status: PROGRESSING   3.  Patient will increase Berg Balance score by > 6 points to demonstrate decreased fall risk during functional activities. Baseline: 46/56; 06/06/2022= 49/56 (however did not assess steps so likely higher and will  need to complete next visit)  Goal status: PROGRESSING   4.  Patient will reduce timed up and go to <11 seconds to reduce fall risk and demonstrate improved transfer/gait ability. Baseline: 18.42; 06/06/2022= 17.39 sec without a device.  Goal status: PROGRESSING   5.  Patient will increase 10 meter walk test to >1.0m/s as to improve gait speed for better community ambulation and to reduce fall risk. Baseline: 0.68 m.s Goal status: INITIAL  6.  Patient will increase six minute walk test distance to >1000 for progression to community ambulator and improve gait ability Baseline: 565 ft, one standing rest break utilized at 5 min; 06/06/22= 615 feet Goal status: PROGRESSING    ASSESSMENT:  CLINICAL IMPRESSION:  Pt put forth great effort throughout the treatment session and was able to perform all tasks with good technique.  Pt enthusiastic about performing the Korebalance tasks and attempts to perform with great effort.  Pt thoroughly enjoyed the activities and is making good progress with her balance.  Pt does need the arm bars for UE support to keep balance, but does well with management of her balance.  Pt to continue to improve balance and strength levels prior to cardiac surgery at end of January, beginning of February.   Pt will continue to benefit from skilled therapy to address remaining deficits in order to improve overall QoL and return to PLOF.      OBJECTIVE IMPAIRMENTS Abnormal gait, decreased activity tolerance, decreased balance, decreased mobility, difficulty walking, and decreased strength.   ACTIVITY LIMITATIONS lifting, bending, squatting, stairs, transfers, and reach over head  PARTICIPATION LIMITATIONS: cleaning, laundry, community activity, and yard work  PERSONAL FACTORS Age, Past/current experiences, and Time since onset of injury/illness/exacerbation are also affecting patient's functional outcome.   REHAB POTENTIAL: Good  CLINICAL DECISION MAKING:  Stable/uncomplicated  EVALUATION COMPLEXITY: Low  PLAN: PT FREQUENCY: 2x/week  PT DURATION: 12 weeks  PLANNED INTERVENTIONS: Therapeutic exercises, Therapeutic activity,   Neuromuscular re-education, Balance training, Gait training, Patient/Family education, Self Care, Joint mobilization, and Stair training  PLAN FOR NEXT SESSION:   Continue to improve LE strength and work on balance with task oriented items.    , PT, DPT Physical Therapist- [place]  [facility]  06/27/22, 3:15 PM 

## 2022-06-28 NOTE — Therapy (Signed)
OUTPATIENT PHYSICAL THERAPY TREATMENT NOTE   Patient Name: April Rogers MRN: [id number] DOB:Apr 04, 1941, 81 y.o., female Today's Date: 06/29/2022   PCP: Adin Hector, MD REFERRING PROVIDER: Adin Hector, MD   PT End of Session - 06/29/22 1427     Visit Number 17    Number of Visits 24    Date for PT Re-Evaluation 07/18/22    Authorization Type [employer]    Authorization Time Period 04/25/22-07/18/22    Authorization - Number of Visits 24    Progress Note Due on Visit 20    PT Start Time 1430    PT Stop Time 1518    PT Time Calculation (min) 48 min    Equipment Utilized During Treatment Gait belt    Activity Tolerance Patient tolerated treatment well    Behavior During Therapy WFL for tasks assessed/performed                 Past Medical History:  Diagnosis Date   Anxiety    Arthritis    Cancer ([place])    skin cancer   Complication of anesthesia    general anesthesia hard on memory after pt goes home   Depression    Fracture of one rib    GERD (gastroesophageal reflux disease)    was on nexium but no longer needs to take this medication   Hypertension    Macular degeneration of both eyes    Osteoporosis    Past Surgical History:  Procedure Laterality Date   ARM SKIN LESION BIOPSY / EXCISION Right    CHOLECYSTECTOMY     COLONOSCOPY WITH PROPOFOL N/A 08/03/2015   Procedure: COLONOSCOPY WITH PROPOFOL;  Surgeon: Hulen Luster, MD;  Location: [facility] ENDOSCOPY;  Service: Gastroenterology;  Laterality: N/A;   INTRAMEDULLARY (IM) NAIL INTERTROCHANTERIC Right 11/26/2015   Procedure: INTRAMEDULLARY (IM) NAIL INTERTROCHANTRIC;  Surgeon: Earnestine Leys, MD;  Location: [facility] ORS;  Service: Orthopedics;  Laterality: Right;   KYPHOPLASTY N/A 08/16/2019   Procedure: L4 KYPHOPLASTY;  Surgeon: Hessie Knows, MD;  Location: [facility] ORS;  Service: Orthopedics;  Laterality: N/A;   KYPHOPLASTY N/A 08/29/2019   Procedure: L2 KYPHOPLASTY;  Surgeon: Hessie Knows, MD;  Location:  [facility] ORS;  Service: Orthopedics;  Laterality: N/A;   KYPHOPLASTY N/A 10/10/2019   Procedure: L3 KYPHOPLASTY;  Surgeon: Hessie Knows, MD;  Location: [facility] ORS;  Service: Orthopedics;  Laterality: N/A;   open heart surgery  2019   valve replacement, aortic arch  aneurysm repair   TONSILLECTOMY     TUBAL LIGATION     WRIST RECONSTRUCTION Right    Patient Active Problem List   Diagnosis Date Noted   Femur fracture, right, closed, initial encounter 11/25/2015    ONSET DATE: 04/05/22  REFERRING DIAG: R26.0 (ICD-10-CM) - Ataxic gait  THERAPY DIAG:  Difficulty in walking, not elsewhere classified  Muscle weakness (generalized)  Unsteadiness on feet  Abnormality of gait and mobility  Rationale for Evaluation and Treatment Rehabilitation   SUBJECTIVE:  SUBJECTIVE STATEMENT: Patient presents with daughter. Brought cookies for the clinic. No falls or LOB since last session, has been compliant with HEP    Pt accompanied by: family member, daughter, Altha Harm.  PERTINENT HISTORY: Pt is referred to clinic for decreased balance.  Pt and daughter note that she has been doing exercises at home, although not as frequent.  Pt's daughter notes that the pt's endurance has declined and her ability to navigate curbs/stairs has declined as well.  MD and pt both agreed for second bout of therapy in order to return to PLOF.  PAIN:  Are you having pain? No  PRECAUTIONS: Fall  WEIGHT BEARING RESTRICTIONS No  FALLS: Has patient fallen in last 6 months? No  LIVING ENVIRONMENT: Lives with: lives alone Lives in: House/apartment Stairs: No Has following equipment at home: Gilford Rile - 4 wheeled; utilizes the furniture or walls at home.  PLOF: Independent with basic ADLs; utilizes the rollator in the morning until her  vision and balance gets better in the morning.  PATIENT GOALS  Pt states that she would like to be able to go up 1-2 steps/curbs that she needs to navigate when visiting other people in the family/friends.  Pt wants to improve stamina and endurance when ambulating in order to be more functional at home.  Pt unable to use the rollator to get inside the door as much as she was prior.  3# weight limit with UE activities (apparently has been set by family members, not specifically MD)  OBJECTIVE:   DIAGNOSTIC FINDINGS: None  COGNITION: Overall cognitive status: Within functional limits for tasks assessed   SENSATION: WFL  COORDINATION: WFL  POSTURE: rounded shoulders and forward head  LOWER EXTREMITY ROM:     WFL  LOWER EXTREMITY MMT:    MMT Right Eval Left Eval  Hip flexion 4 5  Hip abduction 4 4  Hip adduction 4 4  Knee flexion 4 4  Knee extension 5 5  Ankle dorsiflexion 4 4    GAIT: Gait pattern: step through pattern, trunk flexed, and wide BOS Assistive device utilized: None Level of assistance: SBA Comments: Pt with forward flexed posture during tests and measures necessary for goal assessments.  FUNCTIONAL TESTs:  5 times sit to stand: 18.59 sec Timed up and go (TUG): 18.42 sec 6 minute walk test: Not assessed during evaluation. 10 meter walk test: 14.63 sec Berg Balance Scale: Not assessed during evaluation Dynamic Gait Index: Not assessed during evaluation Functional gait assessment: Not assessed during evaluation FOTO: 44; Predicted 53   TODAY'S TREATMENT:    TherEx   Seated LAQ, 3# AW donned, 15x; 3 second holds at end range  Seated marches, 3# AW donned,10x; arms crossed core activation       Neuro  Activity Description: on airex pad: one R, one L, four in front; tap one that lights up and return to airex pad    Activity Setting:  The Blaze Pod Random setting was chosen to enhance cognitive processing and agility, providing an unpredictable  environment to simulate real-world scenarios, and fostering quick reactions and adaptability.   Number of Pods:  6 Cycles/Sets:  3 Duration (Time or Hit Count):  10  Activity Description: seated on dynadisc: hit blaze pod that lights up; second and third set add in extra difficulty of tap with R hand red, L hand blue  Activity Setting:  The Blaze Pod Random setting was chosen to enhance cognitive processing and agility, providing an unpredictable environment to simulate real-world  scenarios, and fostering quick reactions and adaptability.   Number of Pods:  2 Cycles/Sets:  3 Duration (Time or Hit Count):  10    Dynamic reaching while standing/sidestepping on airex pad for letters for Hangman game at whiteboard.  x8 minutes       PATIENT EDUCATION: Education details: Pt educated on purpose of testing for functional outcomes and discussed plan of care. Person educated: Patient and Child(ren) Education method: Explanation Education comprehension: verbalized understanding   HOME EXERCISE PROGRAM:  Access Code: [email or web address] URL: [email or web address] Date: 05/03/2022 Prepared by: [place]  Exercises - Standing Hip Abduction with Counter Support  - 1 x daily - 7 x weekly - 3 sets - 10 reps - Standing Hip Extension with Counter Support  - 1 x daily - 7 x weekly - 3 sets - 10 reps - Standing March with Counter Support  - 1 x daily - 7 x weekly - 3 sets - 10 reps - Heel Raises with Counter Support  - 1 x daily - 7 x weekly - 3 sets - 10 reps - Mini Squat with Counter Support  - 1 x daily - 7 x weekly - 3 sets - 10 reps - Seated Long Arc Quad  - 1 x daily - 7 x weekly - 3 sets - 10 reps    GOALS: Goals reviewed with patient? Yes  SHORT TERM GOALS: Target date: 07/27/2022  Pt will be independent with HEP in order to demonstrate increased ability to perform tasks related to occupation/hobbies. Baseline: Not given HEP at IE. Goal status: INITIAL   LONG TERM GOALS:  Target date: 09/21/2022  1.  Patient (> 25 years old) will complete five times sit to stand test in < 15 seconds indicating an increased LE strength and improved balance. Baseline: 18.59 sec; 06/06/2022= 13.55 sec  Goal status: GOAL MET  2.  Patient will increase FOTO score to equal to or greater than 53 to demonstrate statistically significant improvement in mobility and quality of life.  Baseline: 44; 06/06/2022= 51 Goal status: PROGRESSING   3.  Patient will increase Berg Balance score by > 6 points to demonstrate decreased fall risk during functional activities. Baseline: 46/56; 06/06/2022= 49/56 (however did not assess steps so likely higher and will need to complete next visit)  Goal status: PROGRESSING   4.  Patient will reduce timed up and go to <11 seconds to reduce fall risk and demonstrate improved transfer/gait ability. Baseline: 18.42; 06/06/2022= 17.39 sec without a device.  Goal status: PROGRESSING   5.  Patient will increase 10 meter walk test to >1.3ms as to improve gait speed for better community ambulation and to reduce fall risk. Baseline: 0.68 m.s Goal status: INITIAL  6.  Patient will increase six minute walk test distance to >1000 for progression to community ambulator and improve gait ability Baseline: 565 ft, one standing rest break utilized at 5 min; 06/06/22= 615 feet Goal status: PROGRESSING    ASSESSMENT:  CLINICAL IMPRESSION:  Patient presents with great motivation throughout session with her daughter. Patient requires cueing for return to neutral alignment between tapping blaze pods due to instability indicating continued need for safety cueing. She demonstrates understanding with repetition of tasks, the more complex the more repeititon required for mastering task.   Pt will continue to benefit from skilled therapy to address remaining deficits in order to improve overall QoL and return to PLOF.      OBJECTIVE IMPAIRMENTS Abnormal gait, decreased  activity tolerance, decreased balance, decreased  mobility, difficulty walking, and decreased strength.   ACTIVITY LIMITATIONS lifting, bending, squatting, stairs, transfers, and reach over head  PARTICIPATION LIMITATIONS: cleaning, laundry, community activity, and yard work  [place] Age, Past/current experiences, and Time since onset of injury/illness/exacerbation are also affecting patient's functional outcome.   REHAB POTENTIAL: Good  CLINICAL DECISION MAKING: Stable/uncomplicated  EVALUATION COMPLEXITY: Low  PLAN: PT FREQUENCY: 2x/week  PT DURATION: 12 weeks  PLANNED INTERVENTIONS: Therapeutic exercises, Therapeutic activity, Neuromuscular re-education, Balance training, Gait training, Patient/Family education, Self Care, Joint mobilization, and Stair training  PLAN FOR NEXT SESSION:   Continue to improve LE strength and work on balance with task oriented items.   Janna Arch, PT, DPT  Physical Therapist- [facility]  06/29/22, 3:28 PM

## 2022-06-29 ENCOUNTER — Ambulatory Visit: Payer: Medicare HMO

## 2022-06-29 ENCOUNTER — Encounter: Payer: Self-pay | Admitting: *Deleted

## 2022-06-29 ENCOUNTER — Telehealth: Payer: Self-pay | Admitting: *Deleted

## 2022-06-29 DIAGNOSIS — R262 Difficulty in walking, not elsewhere classified: Secondary | ICD-10-CM | POA: Diagnosis not present

## 2022-06-29 DIAGNOSIS — M6281 Muscle weakness (generalized): Secondary | ICD-10-CM | POA: Diagnosis not present

## 2022-06-29 DIAGNOSIS — R293 Abnormal posture: Secondary | ICD-10-CM | POA: Diagnosis not present

## 2022-06-29 DIAGNOSIS — R269 Unspecified abnormalities of gait and mobility: Secondary | ICD-10-CM

## 2022-06-29 DIAGNOSIS — R2681 Unsteadiness on feet: Secondary | ICD-10-CM

## 2022-06-29 NOTE — Patient Outreach (Signed)
  Care Coordination   06/29/2022 Name: April Rogers MRN: [id number] DOB: 11/28/1940   Care Coordination Outreach Attempts:  An unsuccessful telephone outreach was attempted today to offer the patient information about available care coordination services as a benefit of their health plan.   Follow Up Plan:  Additional outreach attempts will be made to offer the patient care coordination information and services.   Encounter Outcome:  No Answer   Care Coordination Interventions:  No, not indicated    Valente David, RN, MSN, [facility] Care Management Care Management Coordinator [phone]

## 2022-06-29 NOTE — Patient Instructions (Signed)
Visit Information  Thank you for taking time to visit with me today. Please don't hesitate to contact me if I can be of assistance to you before our next scheduled telephone appointment.  Following are the goals we discussed today:  Continue monitoring blood pressure and heart rate daily.  Our next appointment is by telephone on 2/6  Please call the care guide team at [phone] if you need to cancel or reschedule your appointment.   Please call the Suicide and Crisis Lifeline: 988 call the [place] National Suicide Prevention Lifeline: (734)819-5190 or TTY: 506-425-4521 TTY 215-884-9509) to talk to a trained counselor call 1-800-273-TALK (toll free, 24 hour hotline) call 911 if you are experiencing a Mental Health or [place] or need someone to talk to.  Patient verbalizes understanding of instructions and care plan provided today and agrees to view in [place]. Active MyChart status and patient understanding of how to access instructions and care plan via MyChart confirmed with patient.     The patient has been provided with contact information for the care management team and has been advised to call with any health related questions or concerns.   Valente David, RN, MSN, [place] Care Management Care Management Coordinator [phone]

## 2022-06-29 NOTE — Patient Outreach (Addendum)
  Care Coordination   Initial Visit Note   06/29/2022 Name: April Rogers MRN: [id number] DOB: 08-14-1940  April Rogers is a 81 y.o. year old female who sees Adin Hector, MD for primary care. I spoke with  April Rogers by phone today.  What matters to the patients health and wellness today?  Doing well with PT sessions, preparing for TAVR.      Goals Addressed             This Visit's Progress    Endovascular aortic repair without complications       Care Coordination Interventions: Evaluation of current treatment plan related to Aortic aneurysm and upcoming TAVR and patient's adherence to plan as established by provider Advised patient to continue monitoring blood pressure and heart rate, notify cardiology when outside stated parameters.  Goal is less than 130/80, daughter report at goal Provided education to patient re: potential plan of care post surgery, including cardiac rehab Reviewed medications with patient and discussed adherence and affordability Reviewed scheduled/upcoming provider appointments including cardiac surgery on 2/1 and PCP follow up on 3/27 Discussed plans with patient for ongoing care management follow up and provided patient with direct contact information for care management team Screening for signs and symptoms of depression related to chronic disease state  Assessed social determinant of health barriers Discussed support system in the home, lives alone but 2 daughters help with management of health and home.  Post surgery, she will discharge home with [name], then when patient is stronger, [name] will stay with patient until strong enough to live independently again.  Encouraged to continue outpatient PT sessions to continue to gain strength in preparation of upcoming surgery         SDOH assessments and interventions completed:  Yes  SDOH Interventions Today    Flowsheet Row Most Recent Value  SDOH Interventions   Food Insecurity  Interventions Intervention Not Indicated  Housing Interventions Intervention Not Indicated  Transportation Interventions Intervention Not Indicated        Care Coordination Interventions:  Yes, provided   Follow up plan: Follow up call scheduled for 2/6    Encounter Outcome:  Pt. Visit Completed   Valente David, RN, MSN, [place] [facility] Care Management Coordinator [phone]

## 2022-07-06 ENCOUNTER — Ambulatory Visit: Payer: Medicare HMO

## 2022-07-06 DIAGNOSIS — R293 Abnormal posture: Secondary | ICD-10-CM

## 2022-07-06 DIAGNOSIS — R2681 Unsteadiness on feet: Secondary | ICD-10-CM

## 2022-07-06 DIAGNOSIS — R269 Unspecified abnormalities of gait and mobility: Secondary | ICD-10-CM | POA: Diagnosis not present

## 2022-07-06 DIAGNOSIS — R262 Difficulty in walking, not elsewhere classified: Secondary | ICD-10-CM | POA: Diagnosis not present

## 2022-07-06 DIAGNOSIS — M6281 Muscle weakness (generalized): Secondary | ICD-10-CM | POA: Diagnosis not present

## 2022-07-06 NOTE — Therapy (Signed)
OUTPATIENT PHYSICAL THERAPY TREATMENT NOTE   Patient Name: April Rogers MRN: [id number] DOB:[birth year], 81 y.o., female Today's Date: 07/06/2022   PCP: Adin Hector, MD REFERRING PROVIDER: Adin Hector, MD   PT End of Session - 07/06/22 1354     Visit Number 18    Number of Visits 24    Date for PT Re-Evaluation 07/18/22    Authorization Type [employer]    Authorization Time Period 04/25/22-07/18/22    Authorization - Number of Visits 24    Progress Note Due on Visit 20    PT Start Time 1349    PT Stop Time 1430    PT Time Calculation (min) 41 min    Equipment Utilized During Treatment Gait belt    Activity Tolerance Patient tolerated treatment well    Behavior During Therapy WFL for tasks assessed/performed                 Past Medical History:  Diagnosis Date   Anxiety    Arthritis    Cancer ([place])    skin cancer   Complication of anesthesia    general anesthesia hard on memory after pt goes home   Depression    Fracture of one rib    GERD (gastroesophageal reflux disease)    was on nexium but no longer needs to take this medication   Hypertension    Macular degeneration of both eyes    Osteoporosis    Past Surgical History:  Procedure Laterality Date   ARM SKIN LESION BIOPSY / EXCISION Right    CHOLECYSTECTOMY     COLONOSCOPY WITH PROPOFOL N/A 08/03/2015   Procedure: COLONOSCOPY WITH PROPOFOL;  Surgeon: Hulen Luster, MD;  Location: [facility] ENDOSCOPY;  Service: Gastroenterology;  Laterality: N/A;   INTRAMEDULLARY (IM) NAIL INTERTROCHANTERIC Right 11/26/2015   Procedure: INTRAMEDULLARY (IM) NAIL INTERTROCHANTRIC;  Surgeon: Earnestine Leys, MD;  Location: [facility] ORS;  Service: Orthopedics;  Laterality: Right;   KYPHOPLASTY N/A 08/16/2019   Procedure: L4 KYPHOPLASTY;  Surgeon: Hessie Knows, MD;  Location: [facility] ORS;  Service: Orthopedics;  Laterality: N/A;   KYPHOPLASTY N/A 08/29/2019   Procedure: L2 KYPHOPLASTY;  Surgeon: Hessie Knows, MD;  Location:  [facility] ORS;  Service: Orthopedics;  Laterality: N/A;   KYPHOPLASTY N/A 10/10/2019   Procedure: L3 KYPHOPLASTY;  Surgeon: Hessie Knows, MD;  Location: [facility] ORS;  Service: Orthopedics;  Laterality: N/A;   open heart surgery  2019   valve replacement, aortic arch  aneurysm repair   TONSILLECTOMY     TUBAL LIGATION     WRIST RECONSTRUCTION Right    Patient Active Problem List   Diagnosis Date Noted   Femur fracture, right, closed, initial encounter 11/25/2015    ONSET DATE: 04/05/22  REFERRING DIAG: R26.0 (ICD-10-CM) - Ataxic gait  THERAPY DIAG:  Difficulty in walking, not elsewhere classified  Muscle weakness (generalized)  Unsteadiness on feet  Abnormality of gait and mobility  Abnormal posture  Rationale for Evaluation and Treatment Rehabilitation   SUBJECTIVE:  SUBJECTIVE STATEMENT:  Pt notes that she has not been able to exercise as as much due to the holiday weekend.     Pt accompanied by: family member, daughter, Altha Harm.  PERTINENT HISTORY: Pt is referred to clinic for decreased balance.  Pt and daughter note that she has been doing exercises at home, although not as frequent.  Pt's daughter notes that the pt's endurance has declined and her ability to navigate curbs/stairs has declined as well.  MD and pt both agreed for second bout of therapy in order to return to PLOF.  PAIN:  Are you having pain? No  PRECAUTIONS: Fall  WEIGHT BEARING RESTRICTIONS No  FALLS: Has patient fallen in last 6 months? No  LIVING ENVIRONMENT: Lives with: lives alone Lives in: House/apartment Stairs: No Has following equipment at home: Gilford Rile - 4 wheeled; utilizes the furniture or walls at home.  PLOF: Independent with basic ADLs; utilizes the rollator in the morning until her vision and balance  gets better in the morning.  PATIENT GOALS  Pt states that she would like to be able to go up 1-2 steps/curbs that she needs to navigate when visiting other people in the family/friends.  Pt wants to improve stamina and endurance when ambulating in order to be more functional at home.  Pt unable to use the rollator to get inside the door as much as she was prior.  3# weight limit with UE activities (apparently has been set by family members, not specifically MD)  OBJECTIVE:   DIAGNOSTIC FINDINGS: None  COGNITION: Overall cognitive status: Within functional limits for tasks assessed   SENSATION: WFL  COORDINATION: WFL  POSTURE: rounded shoulders and forward head  LOWER EXTREMITY ROM:     WFL  LOWER EXTREMITY MMT:    MMT Right Eval Left Eval  Hip flexion 4 5  Hip abduction 4 4  Hip adduction 4 4  Knee flexion 4 4  Knee extension 5 5  Ankle dorsiflexion 4 4    GAIT: Gait pattern: step through pattern, trunk flexed, and wide BOS Assistive device utilized: None Level of assistance: SBA Comments: Pt with forward flexed posture during tests and measures necessary for goal assessments.  FUNCTIONAL TESTs:  5 times sit to stand: 18.59 sec Timed up and go (TUG): 18.42 sec 6 minute walk test: Not assessed during evaluation. 10 meter walk test: 14.63 sec Berg Balance Scale: Not assessed during evaluation Dynamic Gait Index: Not assessed during evaluation Functional gait assessment: Not assessed during evaluation FOTO: 44; Predicted 53   TODAY'S TREATMENT:   TherEx   Seated LAQ, 3# AW donned, 15x; 3 second holds at end range  Seated marches, 3# AW donned,10x; arms crossed core activation  STS, 2x10  with arms crossed Ambulation around the gym without weights and pt noted to have some SOB and requested to be seated.  BP taken and noted:  Following Ambulation on R arm: BP: 146/124  After Several Minutes on L arm: BP: 126/62  Pt then continued with ambulation  around the gym with 4# AW donned x3 total laps.      PATIENT EDUCATION: Education details: Pt educated on purpose of testing for functional outcomes and discussed plan of care. Person educated: Patient and Child(ren) Education method: Explanation Education comprehension: verbalized understanding   HOME EXERCISE PROGRAM:  Access Code: [email or web address] URL: [email or web address] Date: 05/03/2022 Prepared by: [place]  Exercises - Standing Hip Abduction with Counter Support  - 1 x daily - 7 x weekly - 3  sets - 10 reps - Standing Hip Extension with Counter Support  - 1 x daily - 7 x weekly - 3 sets - 10 reps - Standing March with Counter Support  - 1 x daily - 7 x weekly - 3 sets - 10 reps - Heel Raises with Counter Support  - 1 x daily - 7 x weekly - 3 sets - 10 reps - Mini Squat with Counter Support  - 1 x daily - 7 x weekly - 3 sets - 10 reps - Seated Long Arc Quad  - 1 x daily - 7 x weekly - 3 sets - 10 reps    GOALS: Goals reviewed with patient? Yes  SHORT TERM GOALS: Target date: 08/03/2022  Pt will be independent with HEP in order to demonstrate increased ability to perform tasks related to occupation/hobbies. Baseline: Not given HEP at IE. Goal status: INITIAL   LONG TERM GOALS: Target date: 09/28/2022  1.  Patient (> 72 years old) will complete five times sit to stand test in < 15 seconds indicating an increased LE strength and improved balance. Baseline: 18.59 sec; 06/06/2022= 13.55 sec  Goal status: GOAL MET  2.  Patient will increase FOTO score to equal to or greater than 53 to demonstrate statistically significant improvement in mobility and quality of life.  Baseline: 44; 06/06/2022= 51 Goal status: PROGRESSING   3.  Patient will increase Berg Balance score by > 6 points to demonstrate decreased fall risk during functional activities. Baseline: 46/56; 06/06/2022= 49/56 (however did not assess steps so likely higher and will need to complete next  visit)  Goal status: PROGRESSING   4.  Patient will reduce timed up and go to <11 seconds to reduce fall risk and demonstrate improved transfer/gait ability. Baseline: 18.42; 06/06/2022= 17.39 sec without a device.  Goal status: PROGRESSING   5.  Patient will increase 10 meter walk test to >1.68ms as to improve gait speed for better community ambulation and to reduce fall risk. Baseline: 0.68 m.s Goal status: INITIAL  6.  Patient will increase six minute walk test distance to >1000 for progression to community ambulator and improve gait ability Baseline: 565 ft, one standing rest break utilized at 5 min; 06/06/22= 615 feet Goal status: PROGRESSING    ASSESSMENT:  CLINICAL IMPRESSION:  Pt much more fatigued during session and she attributes it to not being able to walk as much as she would normally over the weekend due to the holiday season.  Pt otherwise gave great effort throughout the session.  Pt somewhat winded and vitals were assessed following ambulation and were much better after rest break.  Pt then continued with therapy and performed well with the tasks given.  Pt to continue with balance and LE strengthening to improve overall performance and mobility.   Pt will continue to benefit from skilled therapy to address remaining deficits in order to improve overall QoL and return to PLOF.       OBJECTIVE IMPAIRMENTS Abnormal gait, decreased activity tolerance, decreased balance, decreased mobility, difficulty walking, and decreased strength.   ACTIVITY LIMITATIONS lifting, bending, squatting, stairs, transfers, and reach over head  PARTICIPATION LIMITATIONS: cleaning, laundry, community activity, and yard work  PMcKenzieAge, Past/current experiences, and Time since onset of injury/illness/exacerbation are also affecting patient's functional outcome.   REHAB POTENTIAL: Good  CLINICAL DECISION MAKING: Stable/uncomplicated  EVALUATION COMPLEXITY: Low  PLAN: PT  FREQUENCY: 2x/week  PT DURATION: 12 weeks  PLANNED INTERVENTIONS: Therapeutic exercises, Therapeutic activity, Neuromuscular re-education,  Balance training, Gait training, Patient/Family education, Self Care, Joint mobilization, and Stair training  PLAN FOR NEXT SESSION:   Continue to improve LE strength and work on balance with task oriented items.    Gwenlyn Saran, PT, DPT Physical Therapist- [facility]  07/06/22, 2:04 PM

## 2022-07-12 NOTE — Therapy (Incomplete)
OUTPATIENT PHYSICAL THERAPY TREATMENT NOTE   Patient Name: April Rogers MRN: [id number] DOB:06-25-1941, 82 y.o., female Today's Date: 07/13/2022   PCP: Adin Hector, MD REFERRING PROVIDER: Adin Hector, MD   PT End of Session - 07/13/22 1340     Visit Number 19    Number of Visits 24    Date for PT Re-Evaluation 07/18/22    Authorization Type [employer]    Authorization Time Period 04/25/22-07/18/22    Authorization - Number of Visits 24    Progress Note Due on Visit 20    PT Start Time 1340    PT Stop Time 1430    PT Time Calculation (min) 50 min    Equipment Utilized During Treatment Gait belt    Activity Tolerance Patient tolerated treatment well    Behavior During Therapy WFL for tasks assessed/performed                  Past Medical History:  Diagnosis Date   Anxiety    Arthritis    Cancer ([place])    skin cancer   Complication of anesthesia    general anesthesia hard on memory after pt goes home   Depression    Fracture of one rib    GERD (gastroesophageal reflux disease)    was on nexium but no longer needs to take this medication   Hypertension    Macular degeneration of both eyes    Osteoporosis    Past Surgical History:  Procedure Laterality Date   ARM SKIN LESION BIOPSY / EXCISION Right    CHOLECYSTECTOMY     COLONOSCOPY WITH PROPOFOL N/A 08/03/2015   Procedure: COLONOSCOPY WITH PROPOFOL;  Surgeon: Hulen Luster, MD;  Location: [facility] ENDOSCOPY;  Service: Gastroenterology;  Laterality: N/A;   INTRAMEDULLARY (IM) NAIL INTERTROCHANTERIC Right 11/26/2015   Procedure: INTRAMEDULLARY (IM) NAIL INTERTROCHANTRIC;  Surgeon: Earnestine Leys, MD;  Location: [facility] ORS;  Service: Orthopedics;  Laterality: Right;   KYPHOPLASTY N/A 08/16/2019   Procedure: L4 KYPHOPLASTY;  Surgeon: Hessie Knows, MD;  Location: [facility] ORS;  Service: Orthopedics;  Laterality: N/A;   KYPHOPLASTY N/A 08/29/2019   Procedure: L2 KYPHOPLASTY;  Surgeon: Hessie Knows, MD;  Location:  [facility] ORS;  Service: Orthopedics;  Laterality: N/A;   KYPHOPLASTY N/A 10/10/2019   Procedure: L3 KYPHOPLASTY;  Surgeon: Hessie Knows, MD;  Location: [facility] ORS;  Service: Orthopedics;  Laterality: N/A;   open heart surgery  2019   valve replacement, aortic arch  aneurysm repair   TONSILLECTOMY     TUBAL LIGATION     WRIST RECONSTRUCTION Right    Patient Active Problem List   Diagnosis Date Noted   Femur fracture, right, closed, initial encounter 11/25/2015    ONSET DATE: 04/05/22  REFERRING DIAG: R26.0 (ICD-10-CM) - Ataxic gait  THERAPY DIAG:  Difficulty in walking, not elsewhere classified  Muscle weakness (generalized)  Unsteadiness on feet  Abnormality of gait and mobility  Abnormal posture  Rationale for Evaluation and Treatment Rehabilitation   SUBJECTIVE:  SUBJECTIVE STATEMENT:  Pt arrives in tearful state due to having a "mother-daughter" conflict with her daughter Altha Harm.  Pt able to be consoled and is able to recollect her thoughts and participate in therapy.     Pt accompanied by: family member, daughter, Altha Harm.  PERTINENT HISTORY: Pt is referred to clinic for decreased balance.  Pt and daughter note that she has been doing exercises at home, although not as frequent.  Pt's daughter notes that the pt's endurance has declined and her ability to navigate curbs/stairs has declined as well.  MD and pt both agreed for second bout of therapy in order to return to PLOF.  PAIN:  Are you having pain? No  PRECAUTIONS: Fall  WEIGHT BEARING RESTRICTIONS No  FALLS: Has patient fallen in last 6 months? No  LIVING ENVIRONMENT: Lives with: lives alone Lives in: House/apartment Stairs: No Has following equipment at home: Gilford Rile - 4 wheeled; utilizes the furniture or walls at  home.  PLOF: Independent with basic ADLs; utilizes the rollator in the morning until her vision and balance gets better in the morning.  PATIENT GOALS  Pt states that she would like to be able to go up 1-2 steps/curbs that she needs to navigate when visiting other people in the family/friends.  Pt wants to improve stamina and endurance when ambulating in order to be more functional at home.  Pt unable to use the rollator to get inside the door as much as she was prior.  3# weight limit with UE activities (apparently has been set by family members, not specifically MD)  OBJECTIVE:   DIAGNOSTIC FINDINGS: None  COGNITION: Overall cognitive status: Within functional limits for tasks assessed   SENSATION: WFL  COORDINATION: WFL  POSTURE: rounded shoulders and forward head  LOWER EXTREMITY ROM:     WFL  LOWER EXTREMITY MMT:    MMT Right Eval Left Eval  Hip flexion 4 5  Hip abduction 4 4  Hip adduction 4 4  Knee flexion 4 4  Knee extension 5 5  Ankle dorsiflexion 4 4    GAIT: Gait pattern: step through pattern, trunk flexed, and wide BOS Assistive device utilized: None Level of assistance: SBA Comments: Pt with forward flexed posture during tests and measures necessary for goal assessments.  FUNCTIONAL TESTs:  5 times sit to stand: 18.59 sec Timed up and go (TUG): 18.42 sec 6 minute walk test: Not assessed during evaluation. 10 meter walk test: 14.63 sec Berg Balance Scale: Not assessed during evaluation Dynamic Gait Index: Not assessed during evaluation Functional gait assessment: Not assessed during evaluation FOTO: 44; Predicted 53   TODAY'S TREATMENT:   TherEx:  Seated LAQ, 3# AW donned, 15x; 3 second holds at end range  Seated marches, 3# AW donned,10x; arms crossed core activation  STS x10  with arms crossed   Neuro:  [employer], 1 total race attempts during the practice Bronze portion, 2 min 31 sec before getting stuck in trees, stability  level 3 [employer], 1 total race attempts during the practice Bronze portion, 6 min 49 sec, stability level 3 [employer] Tux Racer, 1 total race attempts during the extras section, [employer] race, 3 min 43 sec Marching on airex pad, 30 sec bouts x3 Static stance on airex pad with vertical/horizontal head turns 30 sec bouts each direction     PATIENT EDUCATION: Education details: Pt educated on purpose of testing for functional outcomes and discussed plan of care. Person educated: Patient and Child(ren)  Education method: Explanation Education comprehension: verbalized understanding   HOME EXERCISE PROGRAM:  Access Code: [email or web address] URL: [email or web address] Date: 05/03/2022 Prepared by: [place]  Exercises - Standing Hip Abduction with Counter Support  - 1 x daily - 7 x weekly - 3 sets - 10 reps - Standing Hip Extension with Counter Support  - 1 x daily - 7 x weekly - 3 sets - 10 reps - Standing March with Counter Support  - 1 x daily - 7 x weekly - 3 sets - 10 reps - Heel Raises with Counter Support  - 1 x daily - 7 x weekly - 3 sets - 10 reps - Mini Squat with Counter Support  - 1 x daily - 7 x weekly - 3 sets - 10 reps - Seated Long Arc Quad  - 1 x daily - 7 x weekly - 3 sets - 10 reps    GOALS: Goals reviewed with patient? Yes  SHORT TERM GOALS: Target date: 08/10/2022  Pt will be independent with HEP in order to demonstrate increased ability to perform tasks related to occupation/hobbies. Baseline: Not given HEP at IE. Goal status: INITIAL   LONG TERM GOALS: Target date: 10/05/2022  1.  Patient (> 64 years old) will complete five times sit to stand test in < 15 seconds indicating an increased LE strength and improved balance. Baseline: 18.59 sec; 06/06/2022= 13.55 sec  Goal status: GOAL MET  2.  Patient will increase FOTO score to equal to or greater than 53 to demonstrate statistically significant improvement in mobility and quality of  life.  Baseline: 44; 06/06/2022= 51 Goal status: PROGRESSING   3.  Patient will increase Berg Balance score by > 6 points to demonstrate decreased fall risk during functional activities. Baseline: 46/56; 06/06/2022= 49/56 (however did not assess steps so likely higher and will need to complete next visit)  Goal status: PROGRESSING   4.  Patient will reduce timed up and go to <11 seconds to reduce fall risk and demonstrate improved transfer/gait ability. Baseline: 18.42; 06/06/2022= 17.39 sec without a device.  Goal status: PROGRESSING   5.  Patient will increase 10 meter walk test to >1.62ms as to improve gait speed for better community ambulation and to reduce fall risk. Baseline: 0.68 m.s Goal status: INITIAL  6.  Patient will increase six minute walk test distance to >1000 for progression to community ambulator and improve gait ability Baseline: 565 ft, one standing rest break utilized at 5 min; 06/06/22= 615 feet Goal status: PROGRESSING    ASSESSMENT:  CLINICAL IMPRESSION:  Pt very labile at beginning of the session, but able to respond to therapist and calm down after speaking about her difficulties that she was experiencing today.  Pt continues to perform well with the tasks given and elected to participate in both balance and strength training today.  Pt performed well with the [facility]raining and only limited by her vision.   Pt will continue to benefit from skilled therapy to address remaining deficits in order to improve overall QoL and return to PLOF.        OBJECTIVE IMPAIRMENTS Abnormal gait, decreased activity tolerance, decreased balance, decreased mobility, difficulty walking, and decreased strength.   ACTIVITY LIMITATIONS lifting, bending, squatting, stairs, transfers, and reach over head  PARTICIPATION LIMITATIONS: cleaning, laundry, community activity, and yard work  [place]ring[place]Age, Past/current experiences, and Time since onset of  injury/illness/exacerbation are also affecting patient's functional outcome.   REHAB POTENTIAL: Good  CLINICAL DECISION MAKING: Stable/uncomplicated  EVALUATION COMPLEXITY: Low  PLAN: PT FREQUENCY: 2x/week  PT DURATION: 12 weeks  PLANNED INTERVENTIONS: Therapeutic exercises, Therapeutic activity, Neuromuscular re-education, Balance training, Gait training, Patient/Family education, Self Care, Joint mobilization, and Stair training  PLAN FOR NEXT SESSION:   Continue to improve LE strength and work on balance with task oriented items.    Gwenlyn Saran, PT, DPT Physical Therapist- [facility]  07/13/22, 3:39 PM

## 2022-07-13 ENCOUNTER — Ambulatory Visit: Payer: Medicare HMO | Attending: Internal Medicine

## 2022-07-13 DIAGNOSIS — R262 Difficulty in walking, not elsewhere classified: Secondary | ICD-10-CM | POA: Diagnosis not present

## 2022-07-13 DIAGNOSIS — R2681 Unsteadiness on feet: Secondary | ICD-10-CM

## 2022-07-13 DIAGNOSIS — R269 Unspecified abnormalities of gait and mobility: Secondary | ICD-10-CM | POA: Diagnosis not present

## 2022-07-13 DIAGNOSIS — M6281 Muscle weakness (generalized): Secondary | ICD-10-CM | POA: Insufficient documentation

## 2022-07-13 DIAGNOSIS — R293 Abnormal posture: Secondary | ICD-10-CM | POA: Diagnosis not present

## 2022-07-14 DIAGNOSIS — R300 Dysuria: Secondary | ICD-10-CM | POA: Diagnosis not present

## 2022-07-18 ENCOUNTER — Ambulatory Visit: Payer: Medicare HMO

## 2022-07-18 DIAGNOSIS — M6281 Muscle weakness (generalized): Secondary | ICD-10-CM | POA: Diagnosis not present

## 2022-07-18 DIAGNOSIS — R269 Unspecified abnormalities of gait and mobility: Secondary | ICD-10-CM | POA: Diagnosis not present

## 2022-07-18 DIAGNOSIS — R2681 Unsteadiness on feet: Secondary | ICD-10-CM | POA: Diagnosis not present

## 2022-07-18 DIAGNOSIS — R262 Difficulty in walking, not elsewhere classified: Secondary | ICD-10-CM | POA: Diagnosis not present

## 2022-07-18 DIAGNOSIS — R293 Abnormal posture: Secondary | ICD-10-CM

## 2022-07-18 NOTE — Therapy (Signed)
OUTPATIENT PHYSICAL THERAPY TREATMENT NOTE   Patient Name: April Rogers MRN: [id number] DOB:Dec 26, 1940, 82 y.o., female Today's Date: 07/18/2022   PCP: Adin Hector, MD REFERRING PROVIDER: Adin Hector, MD   PT End of Session - 07/18/22 1358     Visit Number 20    Number of Visits 24    Date for PT Re-Evaluation 07/18/22    Authorization Type [employer]    Authorization Time Period 04/25/22-07/18/22    Authorization - Number of Visits 24    Progress Note Due on Visit 20    PT Start Time 1352    PT Stop Time 1431    PT Time Calculation (min) 39 min    Equipment Utilized During Treatment Gait belt    Activity Tolerance Patient tolerated treatment well    Behavior During Therapy WFL for tasks assessed/performed              Past Medical History:  Diagnosis Date   Anxiety    Arthritis    Cancer ([place])    skin cancer   Complication of anesthesia    general anesthesia hard on memory after pt goes home   Depression    Fracture of one rib    GERD (gastroesophageal reflux disease)    was on nexium but no longer needs to take this medication   Hypertension    Macular degeneration of both eyes    Osteoporosis    Past Surgical History:  Procedure Laterality Date   ARM SKIN LESION BIOPSY / EXCISION Right    CHOLECYSTECTOMY     COLONOSCOPY WITH PROPOFOL N/A 08/03/2015   Procedure: COLONOSCOPY WITH PROPOFOL;  Surgeon: Hulen Luster, MD;  [facility];  Service: Gastroenterology;  Laterality: N/A;   INTRAMEDULLARY (IM) NAIL INTERTROCHANTERIC Right 11/26/2015   Procedure: INTRAMEDULLARY (IM) NAIL INTERTROCHANTRIC;  Surgeon: Earnestine Leys, MD;  Location: [facility] ORS;  Service: Orthopedics;  Laterality: Right;   KYPHOPLASTY N/A 08/16/2019   Procedure: L4 KYPHOPLASTY;  Surgeon: Hessie Knows, MD;  Location: [facility] ORS;  Service: Orthopedics;  Laterality: N/A;   KYPHOPLASTY N/A 08/29/2019   Procedure: L2 KYPHOPLASTY;  Surgeon: Hessie Knows, MD;  Location: [facility] ORS;   Service: Orthopedics;  Laterality: N/A;   KYPHOPLASTY N/A 10/10/2019   Procedure: L3 KYPHOPLASTY;  Surgeon: Hessie Knows, MD;  Location: [facility] ORS;  Service: Orthopedics;  Laterality: N/A;   open heart surgery  2019   valve replacement, aortic arch  aneurysm repair   TONSILLECTOMY     TUBAL LIGATION     WRIST RECONSTRUCTION Right    Patient Active Problem List   Diagnosis Date Noted   Femur fracture, right, closed, initial encounter 11/25/2015    ONSET DATE: 04/05/22  REFERRING DIAG: R26.0 (ICD-10-CM) - Ataxic gait  THERAPY DIAG:  Difficulty in walking, not elsewhere classified  Muscle weakness (generalized)  Unsteadiness on feet  Abnormality of gait and mobility  Abnormal posture  Rationale for Evaluation and Treatment Rehabilitation   SUBJECTIVE:  SUBJECTIVE STATEMENT:  Pt doing much better today.  Pt notes that Altha Harm has covid and [name] would be bringing her today and Wednesday this week.  Pt reports she had a good weekend and is ready to begin therapy.    Pt accompanied by: family member, daughter, Altha Harm.  PERTINENT HISTORY: Pt is referred to clinic for decreased balance.  Pt and daughter note that she has been doing exercises at home, although not as frequent.  Pt's daughter notes that the pt's endurance has declined and her ability to navigate curbs/stairs has declined as well.  MD and pt both agreed for second bout of therapy in order to return to PLOF.  PAIN:  Are you having pain? No  PRECAUTIONS: Fall  WEIGHT BEARING RESTRICTIONS No  FALLS: Has patient fallen in last 6 months? No  LIVING ENVIRONMENT: Lives with: lives alone Lives in: House/apartment Stairs: No Has following equipment at home: Gilford Rile - 4 wheeled; utilizes the furniture or walls at home.  PLOF:  Independent with basic ADLs; utilizes the rollator in the morning until her vision and balance gets better in the morning.  PATIENT GOALS  Pt states that she would like to be able to go up 1-2 steps/curbs that she needs to navigate when visiting other people in the family/friends.  Pt wants to improve stamina and endurance when ambulating in order to be more functional at home.  Pt unable to use the rollator to get inside the door as much as she was prior.  3# weight limit with UE activities (apparently has been set by family members, not specifically MD)  OBJECTIVE:   DIAGNOSTIC FINDINGS: None  COGNITION: Overall cognitive status: Within functional limits for tasks assessed   SENSATION: WFL  COORDINATION: WFL  POSTURE: rounded shoulders and forward head  LOWER EXTREMITY ROM:     WFL  LOWER EXTREMITY MMT:    MMT Right Eval Left Eval  Hip flexion 4 5  Hip abduction 4 4  Hip adduction 4 4  Knee flexion 4 4  Knee extension 5 5  Ankle dorsiflexion 4 4    GAIT: Gait pattern: step through pattern, trunk flexed, and wide BOS Assistive device utilized: None Level of assistance: SBA Comments: Pt with forward flexed posture during tests and measures necessary for goal assessments.  FUNCTIONAL TESTs:  5 times sit to stand: 18.59 sec Timed up and go (TUG): 18.42 sec 6 minute walk test: Not assessed during evaluation. 10 meter walk test: 14.63 sec Berg Balance Scale: Not assessed during evaluation Dynamic Gait Index: Not assessed during evaluation Functional gait assessment: Not assessed during evaluation FOTO: 44; Predicted 53   TODAY'S TREATMENT:    Neuro:  Goal assessment perform as noted below.     PATIENT EDUCATION: Education details: Pt educated on purpose of testing for functional outcomes and discussed plan of care. Person educated: Patient and Child(ren) Education method: Explanation Education comprehension: verbalized understanding   HOME EXERCISE  PROGRAM:  Access Code: [email or web address] URL: [email or web address] Date: 05/03/2022 Prepared by: [place]  Exercises - Standing Hip Abduction with Counter Support  - 1 x daily - 7 x weekly - 3 sets - 10 reps - Standing Hip Extension with Counter Support  - 1 x daily - 7 x weekly - 3 sets - 10 reps - Standing March with Counter Support  - 1 x daily - 7 x weekly - 3 sets - 10 reps - Heel Raises with Counter Support  - 1 x daily - 7 x weekly -  3 sets - 10 reps - Mini Squat with Counter Support  - 1 x daily - 7 x weekly - 3 sets - 10 reps - Seated Long Arc Quad  - 1 x daily - 7 x weekly - 3 sets - 10 reps    GOALS: Goals reviewed with patient? Yes  SHORT TERM GOALS: Target date: 08/15/2022  Pt will be independent with HEP in order to demonstrate increased ability to perform tasks related to occupation/hobbies. Baseline: Not given HEP at IE. Goal status: INITIAL   LONG TERM GOALS: Target date: 10/10/2022  1.  Patient (> 43 years old) will complete five times sit to stand test in < 15 seconds indicating an increased LE strength and improved balance. Baseline: 18.59 sec; 06/06/2022= 13.55 sec  07/18/22: 14.36 Goal status: GOAL MET  2.  Patient will increase FOTO score to equal to or greater than 53 to demonstrate statistically significant improvement in mobility and quality of life.  Baseline: 44; 06/06/2022= 51 07/18/22: 52/53 Goal status: PROGRESSING   3.  Patient will increase Berg Balance score by > 6 points to demonstrate decreased fall risk during functional activities. Baseline: 46/56; 06/06/2022= 49/56 (however did not assess steps so likely higher and will need to complete next visit)  07/18/22: 54/56 Goal status: PROGRESSING   4.  Patient will reduce timed up and go to <11 seconds to reduce fall risk and demonstrate improved transfer/gait ability. Baseline: 18.42 06/06/2022= 17.39 sec without a device.  07/18/22: 14.65 sec Goal status: PROGRESSING   5.  Patient will  increase 10 meter walk test to >1.38ms as to improve gait speed for better community ambulation and to reduce fall risk. Baseline: 0.68 m/s 07/18/22: 0.871m Goal status: PROGRESSING  6.  Patient will increase six minute walk test distance to >1000 for progression to community ambulator and improve gait ability Baseline: 565 ft, one standing rest break utilized at 5 min; 06/06/22= 615 feet 07/18/22:  Not able to perform due to time constraints Goal status: PROGRESSING    ASSESSMENT:  CLINICAL IMPRESSION:  Pt performed well with tasks of progress note and goal assessment.  Pt is making steady progress towards goals with improvement in all tasks assessed during treatment session.  Pt did show up to the clinic later, which prevented her ability to perform the 6MWT, but will likely be done at next session.  Patient's condition has the potential to improve in response to therapy. Maximum improvement is yet to be obtained. The anticipated improvement is attainable and reasonable in a generally predictable time.   Pt will continue to benefit from skilled therapy to address remaining deficits in order to improve overall QoL and return to PLOF.      OBJECTIVE IMPAIRMENTS Abnormal gait, decreased activity tolerance, decreased balance, decreased mobility, difficulty walking, and decreased strength.   ACTIVITY LIMITATIONS lifting, bending, squatting, stairs, transfers, and reach over head  PARTICIPATION LIMITATIONS: cleaning, laundry, community activity, and yard work  [place], Past/current experiences, and Time since onset of injury/illness/exacerbation are also affecting patient's functional outcome.   REHAB POTENTIAL: Good  CLINICAL DECISION MAKING: Stable/uncomplicated  EVALUATION COMPLEXITY: Low   PLAN:  PT FREQUENCY: 2x/week  PT DURATION: 12 weeks  PLANNED INTERVENTIONS: Therapeutic exercises, Therapeutic activity, Neuromuscular re-education, Balance training, Gait  training, Patient/Family education, Self Care, Joint mobilization, and Stair training  PLAN FOR NEXT SESSION:   Continue to improve LE strength and work on balance with task oriented items.    JoGwenlyn SaranPT, DPT Physical  Therapist- [facility]  07/18/22, 5:25 PM

## 2022-07-20 ENCOUNTER — Ambulatory Visit: Payer: Medicare HMO

## 2022-07-20 DIAGNOSIS — R2681 Unsteadiness on feet: Secondary | ICD-10-CM | POA: Diagnosis not present

## 2022-07-20 DIAGNOSIS — R262 Difficulty in walking, not elsewhere classified: Secondary | ICD-10-CM | POA: Diagnosis not present

## 2022-07-20 DIAGNOSIS — M6281 Muscle weakness (generalized): Secondary | ICD-10-CM | POA: Diagnosis not present

## 2022-07-20 DIAGNOSIS — R269 Unspecified abnormalities of gait and mobility: Secondary | ICD-10-CM

## 2022-07-20 DIAGNOSIS — R293 Abnormal posture: Secondary | ICD-10-CM

## 2022-07-20 NOTE — Therapy (Addendum)
OUTPATIENT PHYSICAL THERAPY TREATMENT NOTE   Patient Name: April Rogers MRN: [id number] DOB:Aug 21, 1940, 82 y.o., female Today's Date: 07/20/2022   PCP: Adin Hector, MD REFERRING PROVIDER: Adin Hector, MD   PT End of Session - 07/20/22 1353     Visit Number 21    Number of Visits 40   Date for PT Re-Evaluation 09/14/2022   Authorization Type [employer]    Authorization Time Period 04/25/22-07/18/22; 07/18/22-09/14/22   Authorization - Number of Visits 24    Progress Note Due on Visit 20    PT Start Time 1349    PT Stop Time 1430    PT Time Calculation (min) 41 min    Equipment Utilized During Treatment Gait belt    Activity Tolerance Patient tolerated treatment well    Behavior During Therapy WFL for tasks assessed/performed             Past Medical History:  Diagnosis Date   Anxiety    Arthritis    Cancer ([place])    skin cancer   Complication of anesthesia    general anesthesia hard on memory after pt goes home   Depression    Fracture of one rib    GERD (gastroesophageal reflux disease)    was on nexium but no longer needs to take this medication   Hypertension    Macular degeneration of both eyes    Osteoporosis    Past Surgical History:  Procedure Laterality Date   ARM SKIN LESION BIOPSY / EXCISION Right    CHOLECYSTECTOMY     COLONOSCOPY WITH PROPOFOL N/A 08/03/2015   Procedure: COLONOSCOPY WITH PROPOFOL;  Surgeon: Hulen Luster, MD;  Location: [facility] ENDOSCOPY;  Service: Gastroenterology;  Laterality: N/A;   INTRAMEDULLARY (IM) NAIL INTERTROCHANTERIC Right 11/26/2015   Procedure: INTRAMEDULLARY (IM) NAIL INTERTROCHANTRIC;  Surgeon: Earnestine Leys, MD;  Location: [facility] ORS;  Service: Orthopedics;  Laterality: Right;   KYPHOPLASTY N/A 08/16/2019   Procedure: L4 KYPHOPLASTY;  Surgeon: Hessie Knows, MD;  Location: [facility] ORS;  Service: Orthopedics;  Laterality: N/A;   KYPHOPLASTY N/A 08/29/2019   Procedure: L2 KYPHOPLASTY;  Surgeon: Hessie Knows, MD;   Location: [facility] ORS;  Service: Orthopedics;  Laterality: N/A;   KYPHOPLASTY N/A 10/10/2019   Procedure: L3 KYPHOPLASTY;  Surgeon: Hessie Knows, MD;  Location: [facility] ORS;  Service: Orthopedics;  Laterality: N/A;   open heart surgery  2019   valve replacement, aortic arch  aneurysm repair   TONSILLECTOMY     TUBAL LIGATION     WRIST RECONSTRUCTION Right    Patient Active Problem List   Diagnosis Date Noted   Femur fracture, right, closed, initial encounter 11/25/2015    ONSET DATE: 04/05/22  REFERRING DIAG: R26.0 (ICD-10-CM) - Ataxic gait  THERAPY DIAG:  Difficulty in walking, not elsewhere classified  Muscle weakness (generalized)  Unsteadiness on feet  Abnormality of gait and mobility  Abnormal posture  Rationale for Evaluation and Treatment Rehabilitation   SUBJECTIVE:  SUBJECTIVE STATEMENT:  Pt states she is doing well today.  Pt notes she is doing well and has no new complaints.   Pt accompanied by: family member, daughter, April Rogers.  PERTINENT HISTORY: Pt is referred to clinic for decreased balance.  Pt and daughter note that she has been doing exercises at home, although not as frequent.  Pt's daughter notes that the pt's endurance has declined and her ability to navigate curbs/stairs has declined as well.  MD and pt both agreed for second bout of therapy in order to return to PLOF.  PAIN:  Are you having pain? No  PRECAUTIONS: Fall  WEIGHT BEARING RESTRICTIONS No  FALLS: Has patient fallen in last 6 months? No  LIVING ENVIRONMENT: Lives with: lives alone Lives in: House/apartment Stairs: No Has following equipment at home: Gilford Rile - 4 wheeled; utilizes the furniture or walls at home.  PLOF: Independent with basic ADLs; utilizes the rollator in the morning until her vision and  balance gets better in the morning.  PATIENT GOALS  Pt states that she would like to be able to go up 1-2 steps/curbs that she needs to navigate when visiting other people in the family/friends.  Pt wants to improve stamina and endurance when ambulating in order to be more functional at home.  Pt unable to use the rollator to get inside the door as much as she was prior.  3# weight limit with UE activities (apparently has been set by family members, not specifically MD)  OBJECTIVE:   DIAGNOSTIC FINDINGS: None  COGNITION: Overall cognitive status: Within functional limits for tasks assessed   SENSATION: WFL  COORDINATION: WFL  POSTURE: rounded shoulders and forward head  LOWER EXTREMITY ROM:     WFL  LOWER EXTREMITY MMT:    MMT Right Eval Left Eval  Hip flexion 4 5  Hip abduction 4 4  Hip adduction 4 4  Knee flexion 4 4  Knee extension 5 5  Ankle dorsiflexion 4 4    GAIT: Gait pattern: step through pattern, trunk flexed, and wide BOS Assistive device utilized: None Level of assistance: SBA Comments: Pt with forward flexed posture during tests and measures necessary for goal assessments.  FUNCTIONAL TESTs:  5 times sit to stand: 18.59 sec Timed up and go (TUG): 18.42 sec 6 minute walk test: Not assessed during evaluation. 10 meter walk test: 14.63 sec Berg Balance Scale: Not assessed during evaluation Dynamic Gait Index: Not assessed during evaluation Functional gait assessment: Not assessed during evaluation FOTO: 44; Predicted 53   TODAY'S TREATMENT:   TherEx   6MWT - 512' ft with sitting rest break  Seated LAQ, 3# AW donned, 2x15; 3 second holds at end range  Seated marches, 3# AW donned, 2x15; arms crossed for increased core activation       Neuro  Activity Description: Random taps with random colors Activity Setting:  Random Number of Pods:  4 Cycles/Sets:  2 Duration (Time or Hit Count):  20  Patient Stats  Reaction Time:   1,494;  1,578   Activity Description: SLS with taps only using one LE at a time Activity Setting:  Random Number of Pods:  4 Cycles/Sets:  2 each LE Duration (Time or Hit Count):  20  Patient Stats  Reaction Time:    L Foot Taps: 2,134 R Foot Taps: 2,302    PATIENT EDUCATION: Education details: Pt educated on purpose of testing for functional outcomes and discussed plan of care. Person educated: Patient and Child(ren) Education method:  Explanation Education comprehension: verbalized understanding   HOME EXERCISE PROGRAM:  Access Code: [email or web address] URL: [email or web address] Date: 05/03/2022 Prepared by: [place]  Exercises - Standing Hip Abduction with Counter Support  - 1 x daily - 7 x weekly - 3 sets - 10 reps - Standing Hip Extension with Counter Support  - 1 x daily - 7 x weekly - 3 sets - 10 reps - Standing March with Counter Support  - 1 x daily - 7 x weekly - 3 sets - 10 reps - Heel Raises with Counter Support  - 1 x daily - 7 x weekly - 3 sets - 10 reps - Mini Squat with Counter Support  - 1 x daily - 7 x weekly - 3 sets - 10 reps - Seated Long Arc Quad  - 1 x daily - 7 x weekly - 3 sets - 10 reps    GOALS: Goals reviewed with patient? Yes  SHORT TERM GOALS: Target date: 08/17/2022  Pt will be independent with HEP in order to demonstrate increased ability to perform tasks related to occupation/hobbies. Baseline: Not given HEP at IE. Goal status: INITIAL   LONG TERM GOALS: Target date: 10/12/2022  1.  Patient (> 27 years old) will complete five times sit to stand test in < 15 seconds indicating an increased LE strength and improved balance. Baseline: 18.59 sec; 06/06/2022= 13.55 sec  07/18/22: 14.36 Goal status: GOAL MET  2.  Patient will increase FOTO score to equal to or greater than 53 to demonstrate statistically significant improvement in mobility and quality of life.  Baseline: 44; 06/06/2022= 51 07/18/22: 52/53 Goal status: PROGRESSING   3.   Patient will increase Berg Balance score by > 6 points to demonstrate decreased fall risk during functional activities. Baseline: 46/56; 06/06/2022= 49/56 (however did not assess steps so likely higher and will need to complete next visit)  07/18/22: 54/56 Goal status: PROGRESSING   4.  Patient will reduce timed up and go to <11 seconds to reduce fall risk and demonstrate improved transfer/gait ability. Baseline: 18.42 06/06/2022= 17.39 sec without a device.  07/18/22: 14.65 sec Goal status: PROGRESSING   5.  Patient will increase 10 meter walk test to >1.4ms as to improve gait speed for better community ambulation and to reduce fall risk. Baseline: 0.68 m/s 07/18/22: 0.850m Goal status: PROGRESSING  6.  Patient will increase six minute walk test distance to >1000 for progression to community ambulator and improve gait ability Baseline: 565 ft, one standing rest break utilized at 5 min; 06/06/22= 615 feet 07/19/22:  512 ft on seated rest break at 5 min Goal status: PROGRESSING    ASSESSMENT:  CLINICAL IMPRESSION:  Pt put forth great effort and was limited by fatigue when performing the 6MWT.  She attributed her fatigue to having to perform the 6MWT following a lengthy ambulation attempt to get to the clinic which is a fair assessment.  Pt still put forth great effort throughout the tasks.  Pt did well with balance training and use of the blazepods.  Pt did require verbal instructions to slow down and keep balance before continuing, but overall did well.   Pt will continue to benefit from skilled therapy to address remaining deficits in order to improve overall QoL and return to PLOF.      OBJECTIVE IMPAIRMENTS Abnormal gait, decreased activity tolerance, decreased balance, decreased mobility, difficulty walking, and decreased strength.   ACTIVITY LIMITATIONS lifting, bending, squatting, stairs, transfers, and reach over head  PARTICIPATION LIMITATIONS:  cleaning, laundry, community  activity, and yard work  PERSONAL FACTORS Age, Past/current experiences, and Time since onset of injury/illness/exacerbation are also affecting patient's functional outcome.   REHAB POTENTIAL: Good  CLINICAL DECISION MAKING: Stable/uncomplicated  EVALUATION COMPLEXITY: Low   PLAN:  PT FREQUENCY: 2x/week  PT DURATION: 12 weeks  PLANNED INTERVENTIONS: Therapeutic exercises, Therapeutic activity, Neuromuscular re-education, Balance training, Gait training, Patient/Family education, Self Care, Joint mobilization, and Stair training  PLAN FOR NEXT SESSION:   Continue to improve LE strength and work on balance with task oriented items.    Gwenlyn Saran, PT, DPT Physical Therapist- [facility]  07/20/22, 4:03 PM

## 2022-07-25 ENCOUNTER — Ambulatory Visit: Payer: Medicare HMO

## 2022-07-25 NOTE — Therapy (Incomplete)
OUTPATIENT PHYSICAL THERAPY TREATMENT NOTE   Patient Name: April Rogers MRN: [id number] DOB:1940/10/28, 82 y.o., female Today's Date: 07/25/2022   PCP: Adin Hector, MD REFERRING PROVIDER: Adin Hector, MD     Past Medical History:  Diagnosis Date   Anxiety    Arthritis    Cancer [facility])    skin cancer   Complication of anesthesia    general anesthesia hard on memory after pt goes home   Depression    Fracture of one rib    GERD (gastroesophageal reflux disease)    was on nexium but no longer needs to take this medication   Hypertension    Macular degeneration of both eyes    Osteoporosis    Past Surgical History:  Procedure Laterality Date   ARM SKIN LESION BIOPSY / EXCISION Right    CHOLECYSTECTOMY     COLONOSCOPY WITH PROPOFOL N/A 08/03/2015   Procedure: COLONOSCOPY WITH PROPOFOL;  Surgeon: Hulen Luster, MD;  Location: [facility] ENDOSCOPY;  Service: Gastroenterology;  Laterality: N/A;   INTRAMEDULLARY (IM) NAIL INTERTROCHANTERIC Right 11/26/2015   Procedure: INTRAMEDULLARY (IM) NAIL INTERTROCHANTRIC;  Surgeon: Earnestine Leys, MD;  Location: [facility] ORS;  Service: Orthopedics;  Laterality: Right;   KYPHOPLASTY N/A 08/16/2019   Procedure: L4 KYPHOPLASTY;  Surgeon: Hessie Knows, MD;  Location: [facility] ORS;  Service: Orthopedics;  Laterality: N/A;   KYPHOPLASTY N/A 08/29/2019   Procedure: L2 KYPHOPLASTY;  Surgeon: Hessie Knows, MD;  Location: [facility] ORS;  Service: Orthopedics;  Laterality: N/A;   KYPHOPLASTY N/A 10/10/2019   Procedure: L3 KYPHOPLASTY;  Surgeon: Hessie Knows, MD;  Location: [facility] ORS;  Service: Orthopedics;  Laterality: N/A;   open heart surgery  2019   valve replacement, aortic arch  aneurysm repair   TONSILLECTOMY     TUBAL LIGATION     WRIST RECONSTRUCTION Right    Patient Active Problem List   Diagnosis Date Noted   Femur fracture, right, closed, initial encounter 11/25/2015    ONSET DATE: 04/05/22  REFERRING DIAG: R26.0 (ICD-10-CM) - Ataxic  gait  THERAPY DIAG:  No diagnosis found.  Rationale for Evaluation and Treatment Rehabilitation   SUBJECTIVE:                                                                                                                                                                                             SUBJECTIVE STATEMENT:  ***  Pt states she is doing well today.  Pt notes she is doing well and has no new complaints.   Pt accompanied by: family member, daughter, [name].  PERTINENT HISTORY: Pt is referred to clinic for  decreased balance.  Pt and daughter note that she has been doing exercises at home, although not as frequent.  Pt's daughter notes that the pt's endurance has declined and her ability to navigate curbs/stairs has declined as well.  MD and pt both agreed for second bout of therapy in order to return to PLOF.  PAIN:  Are you having pain? No  PRECAUTIONS: Fall  WEIGHT BEARING RESTRICTIONS No  FALLS: Has patient fallen in last 6 months? No  LIVING ENVIRONMENT: Lives with: lives alone Lives in: House/apartment Stairs: No Has following equipment at home: Gilford Rile - 4 wheeled; utilizes the furniture or walls at home.  PLOF: Independent with basic ADLs; utilizes the rollator in the morning until her vision and balance gets better in the morning.  PATIENT GOALS  Pt states that she would like to be able to go up 1-2 steps/curbs that she needs to navigate when visiting other people in the family/friends.  Pt wants to improve stamina and endurance when ambulating in order to be more functional at home.  Pt unable to use the rollator to get inside the door as much as she was prior.  3# weight limit with UE activities (apparently has been set by family members, not specifically MD)  OBJECTIVE:   DIAGNOSTIC FINDINGS: None  COGNITION: Overall cognitive status: Within functional limits for tasks assessed   SENSATION: WFL  COORDINATION: WFL  POSTURE: rounded shoulders and  forward head  LOWER EXTREMITY ROM:     WFL  LOWER EXTREMITY MMT:    MMT Right Eval Left Eval  Hip flexion 4 5  Hip abduction 4 4  Hip adduction 4 4  Knee flexion 4 4  Knee extension 5 5  Ankle dorsiflexion 4 4    GAIT: Gait pattern: step through pattern, trunk flexed, and wide BOS Assistive device utilized: None Level of assistance: SBA Comments: Pt with forward flexed posture during tests and measures necessary for goal assessments.  FUNCTIONAL TESTs:  5 times sit to stand: 18.59 sec Timed up and go (TUG): 18.42 sec 6 minute walk test: Not assessed during evaluation. 10 meter walk test: 14.63 sec Berg Balance Scale: Not assessed during evaluation Dynamic Gait Index: Not assessed during evaluation Functional gait assessment: Not assessed during evaluation FOTO: 44; Predicted 53   TODAY'S TREATMENT:   ***  TherEx   6MWT - 512' ft with sitting rest break  Seated LAQ, 3# AW donned, 2x15; 3 second holds at end range  Seated marches, 3# AW donned, 2x15; arms crossed for increased core activation       Neuro  Activity Description: Random taps with random colors Activity Setting:  Random Number of Pods:  4 Cycles/Sets:  2 Duration (Time or Hit Count):  20  Patient Stats  Reaction Time:   1,494; 1,578   Activity Description: SLS with taps only using one LE at a time Activity Setting:  Random Number of Pods:  4 Cycles/Sets:  2 each LE Duration (Time or Hit Count):  20  Patient Stats  Reaction Time:    L Foot Taps: 2,134 R Foot Taps: 2,302    PATIENT EDUCATION: Education details: Pt educated on purpose of testing for functional outcomes and discussed plan of care. Person educated: Patient and Child(ren) Education method: Explanation Education comprehension: verbalized understanding   HOME EXERCISE PROGRAM:  Access Code: [email or web address] URL: [email or web address] Date: 05/03/2022 Prepared by: [place]  Exercises - Standing Hip  Abduction with Counter Support  - 1 x daily - 7  x weekly - 3 sets - 10 reps - Standing Hip Extension with Counter Support  - 1 x daily - 7 x weekly - 3 sets - 10 reps - Standing March with Counter Support  - 1 x daily - 7 x weekly - 3 sets - 10 reps - Heel Raises with Counter Support  - 1 x daily - 7 x weekly - 3 sets - 10 reps - Mini Squat with Counter Support  - 1 x daily - 7 x weekly - 3 sets - 10 reps - Seated Long Arc Quad  - 1 x daily - 7 x weekly - 3 sets - 10 reps    GOALS: Goals reviewed with patient? Yes  SHORT TERM GOALS: Target date: 08/22/2022  Pt will be independent with HEP in order to demonstrate increased ability to perform tasks related to occupation/hobbies. Baseline: Not given HEP at IE. Goal status: INITIAL   LONG TERM GOALS: Target date: 10/17/2022  1.  Patient (> 67 years old) will complete five times sit to stand test in < 15 seconds indicating an increased LE strength and improved balance. Baseline: 18.59 sec; 06/06/2022= 13.55 sec  07/18/22: 14.36 Goal status: GOAL MET  2.  Patient will increase FOTO score to equal to or greater than 53 to demonstrate statistically significant improvement in mobility and quality of life.  Baseline: 44; 06/06/2022= 51 07/18/22: 52/53 Goal status: PROGRESSING   3.  Patient will increase Berg Balance score by > 6 points to demonstrate decreased fall risk during functional activities. Baseline: 46/56; 06/06/2022= 49/56 (however did not assess steps so likely higher and will need to complete next visit)  07/18/22: 54/56 Goal status: PROGRESSING   4.  Patient will reduce timed up and go to <11 seconds to reduce fall risk and demonstrate improved transfer/gait ability. Baseline: 18.42 06/06/2022= 17.39 sec without a device.  07/18/22: 14.65 sec Goal status: PROGRESSING   5.  Patient will increase 10 meter walk test to >1.62ms as to improve gait speed for better community ambulation and to reduce fall risk. Baseline: 0.68  m/s 07/18/22: 0.84m Goal status: PROGRESSING  6.  Patient will increase six minute walk test distance to >1000 for progression to community ambulator and improve gait ability Baseline: 565 ft, one standing rest break utilized at 5 min; 06/06/22= 615 feet 07/19/22:  512 ft on seated rest break at 5 min Goal status: PROGRESSING    ASSESSMENT:  CLINICAL IMPRESSION: *** Pt put forth great effort and was limited by fatigue when performing the 6MWT.  She attributed her fatigue to having to perform the 6MWT following a lengthy ambulation attempt to get to the clinic which is a fair assessment.  Pt still put forth great effort throughout the tasks.  Pt did well with balance training and use of the blazepods.  Pt did require verbal instructions to slow down and keep balance before continuing, but overall did well.   Pt will continue to benefit from skilled therapy to address remaining deficits in order to improve overall QoL and return to PLOF.      OBJECTIVE IMPAIRMENTS Abnormal gait, decreased activity tolerance, decreased balance, decreased mobility, difficulty walking, and decreased strength.   ACTIVITY LIMITATIONS lifting, bending, squatting, stairs, transfers, and reach over head  PARTICIPATION LIMITATIONS: cleaning, laundry, community activity, and yard work  [place], Past/current experiences, and Time since onset of injury/illness/exacerbation are also affecting patient's functional outcome.   REHAB POTENTIAL: Good  CLINICAL DECISION MAKING: Stable/uncomplicated  EVALUATION COMPLEXITY:  Low   PLAN:  PT FREQUENCY: 2x/week  PT DURATION: 12 weeks  PLANNED INTERVENTIONS: Therapeutic exercises, Therapeutic activity, Neuromuscular re-education, Balance training, Gait training, Patient/Family education, Self Care, Joint mobilization, and Stair training  PLAN FOR NEXT SESSION:  *** Continue to improve LE strength and work on balance with task oriented items.    Gwenlyn Saran, PT, DPT Physical Therapist- [facility]  07/25/22, 9:22 AM

## 2022-07-27 ENCOUNTER — Ambulatory Visit: Payer: Medicare HMO

## 2022-07-27 DIAGNOSIS — R2681 Unsteadiness on feet: Secondary | ICD-10-CM | POA: Diagnosis not present

## 2022-07-27 DIAGNOSIS — M6281 Muscle weakness (generalized): Secondary | ICD-10-CM | POA: Diagnosis not present

## 2022-07-27 DIAGNOSIS — R293 Abnormal posture: Secondary | ICD-10-CM

## 2022-07-27 DIAGNOSIS — R269 Unspecified abnormalities of gait and mobility: Secondary | ICD-10-CM

## 2022-07-27 DIAGNOSIS — R262 Difficulty in walking, not elsewhere classified: Secondary | ICD-10-CM

## 2022-07-27 NOTE — Therapy (Signed)
OUTPATIENT PHYSICAL THERAPY TREATMENT NOTE   Patient Name: April Rogers MRN: [id number] DOB:05-May-1941, 82 y.o., female Today's Date: 07/27/2022   PCP: Adin Hector, MD REFERRING PROVIDER: Adin Hector, MD   PT End of Session - 07/27/22 1527     Visit Number 22    Number of Visits 40    Date for PT Re-Evaluation 09/14/22    Authorization Type [employer]    Authorization Time Period 04/25/22-07/18/22; 07/18/22-09/14/22    Authorization - Number of Visits 24    Progress Note Due on Visit 30    PT Start Time 1320    PT Stop Time 1425    PT Time Calculation (min) 65 min    Equipment Utilized During Treatment Gait belt    Activity Tolerance Patient tolerated treatment well    Behavior During Therapy WFL for tasks assessed/performed              Past Medical History:  Diagnosis Date   Anxiety    Arthritis    Cancer ([place])    skin cancer   Complication of anesthesia    general anesthesia hard on memory after pt goes home   Depression    Fracture of one rib    GERD (gastroesophageal reflux disease)    was on nexium but no longer needs to take this medication   Hypertension    Macular degeneration of both eyes    Osteoporosis    Past Surgical History:  Procedure Laterality Date   ARM SKIN LESION BIOPSY / EXCISION Right    CHOLECYSTECTOMY     COLONOSCOPY WITH PROPOFOL N/A 08/03/2015   Procedure: COLONOSCOPY WITH PROPOFOL;  Surgeon: Hulen Luster, MD;  Location: [facility] ENDOSCOPY;  Service: Gastroenterology;  Laterality: N/A;   INTRAMEDULLARY (IM) NAIL INTERTROCHANTERIC Right 11/26/2015   Procedure: INTRAMEDULLARY (IM) NAIL INTERTROCHANTRIC;  Surgeon: Earnestine Leys, MD;  Location: [facility] ORS;  Service: Orthopedics;  Laterality: Right;   KYPHOPLASTY N/A 08/16/2019   Procedure: L4 KYPHOPLASTY;  Surgeon: Hessie Knows, MD;  Location: [facility] ORS;  Service: Orthopedics;  Laterality: N/A;   KYPHOPLASTY N/A 08/29/2019   Procedure: L2 KYPHOPLASTY;  Surgeon: Hessie Knows, MD;   Location: [facility] ORS;  Service: Orthopedics;  Laterality: N/A;   KYPHOPLASTY N/A 10/10/2019   Procedure: L3 KYPHOPLASTY;  Surgeon: Hessie Knows, MD;  Location: [facility] ORS;  Service: Orthopedics;  Laterality: N/A;   open heart surgery  2019   valve replacement, aortic arch  aneurysm repair   TONSILLECTOMY     TUBAL LIGATION     WRIST RECONSTRUCTION Right    Patient Active Problem List   Diagnosis Date Noted   Femur fracture, right, closed, initial encounter 11/25/2015    ONSET DATE: 04/05/22  REFERRING DIAG: R26.0 (ICD-10-CM) - Ataxic gait  THERAPY DIAG:  Difficulty in walking, not elsewhere classified  Muscle weakness (generalized)  Unsteadiness on feet  Abnormality of gait and mobility  Abnormal posture  Rationale for Evaluation and Treatment Rehabilitation   SUBJECTIVE:  SUBJECTIVE STATEMENT:  Pt is having a good day today.  Pt is feeling much better than she was on Tuesday.   Pt accompanied by: family member, daughter, [name].  PERTINENT HISTORY: Pt is referred to clinic for decreased balance.  Pt and daughter note that she has been doing exercises at home, although not as frequent.  Pt's daughter notes that the pt's endurance has declined and her ability to navigate curbs/stairs has declined as well.  MD and pt both agreed for second bout of therapy in order to return to PLOF.  PAIN:  Are you having pain? No  PRECAUTIONS: Fall  WEIGHT BEARING RESTRICTIONS No  FALLS: Has patient fallen in last 6 months? No  LIVING ENVIRONMENT: Lives with: lives alone Lives in: House/apartment Stairs: No Has following equipment at home: Gilford Rile - 4 wheeled; utilizes the furniture or walls at home.  PLOF: Independent with basic ADLs; utilizes the rollator in the morning until her vision and balance  gets better in the morning.  PATIENT GOALS  Pt states that she would like to be able to go up 1-2 steps/curbs that she needs to navigate when visiting other people in the family/friends.  Pt wants to improve stamina and endurance when ambulating in order to be more functional at home.  Pt unable to use the rollator to get inside the door as much as she was prior.  Pt able to tolerate increased weight in the LE's.  Please do not increase weight when performing anything that might cause strain in the spine.    OBJECTIVE:   DIAGNOSTIC FINDINGS: None  COGNITION: Overall cognitive status: Within functional limits for tasks assessed   SENSATION: WFL  COORDINATION: WFL  POSTURE: rounded shoulders and forward head  LOWER EXTREMITY ROM:     WFL  LOWER EXTREMITY MMT:    MMT Right Eval Left Eval  Hip flexion 4 5  Hip abduction 4 4  Hip adduction 4 4  Knee flexion 4 4  Knee extension 5 5  Ankle dorsiflexion 4 4    GAIT: Gait pattern: step through pattern, trunk flexed, and wide BOS Assistive device utilized: None Level of assistance: SBA Comments: Pt with forward flexed posture during tests and measures necessary for goal assessments.  FUNCTIONAL TESTs:  5 times sit to stand: 18.59 sec Timed up and go (TUG): 18.42 sec 6 minute walk test: Not assessed during evaluation. 10 meter walk test: 14.63 sec Berg Balance Scale: Not assessed during evaluation Dynamic Gait Index: Not assessed during evaluation Functional gait assessment: Not assessed during evaluation FOTO: 44; Predicted 65   TODAY'S TREATMENT:     TherEx  Seated LAQ, 4# AW donned, 2x15; 3 second holds at end range  Seated marches, 4# AW donned, 2x15; arms crossed and sitting away from back of chair for increased core activation  Ambulation around the gym x2 total laps while wearing 4# AW donned  NuStep, seat 5, arms 7, interval training for improved cardiovascular performance Level 1 - 1 min Level 3 - 1  min Level 2 - 1 min Level 4 - 1 min Level 3 - 1 min Level 2 - 1 min Level 1 - 1 min as cool-off period      Neuro   Activity Description: Pt performed random taps utilizing (Green Light  = R UE/LE; Blue Light = L UE/LE) Activity Setting:  Random Number of Pods:  6 Cycles/Sets:  3 Duration (Time or Hit Count):  30 hits  Patient Stats  Reaction Time:  2,251 1,728 2,197       PATIENT EDUCATION: Education details: Pt educated on purpose of testing for functional outcomes and discussed plan of care. Person educated: Patient and Child(ren) Education method: Explanation Education comprehension: verbalized understanding   HOME EXERCISE PROGRAM:  Access Code: [email or web address] URL: [email or web address] Date: 05/03/2022 Prepared by: [place]  Exercises - Standing Hip Abduction with Counter Support  - 1 x daily - 7 x weekly - 3 sets - 10 reps - Standing Hip Extension with Counter Support  - 1 x daily - 7 x weekly - 3 sets - 10 reps - Standing March with Counter Support  - 1 x daily - 7 x weekly - 3 sets - 10 reps - Heel Raises with Counter Support  - 1 x daily - 7 x weekly - 3 sets - 10 reps - Mini Squat with Counter Support  - 1 x daily - 7 x weekly - 3 sets - 10 reps - Seated Long Arc Quad  - 1 x daily - 7 x weekly - 3 sets - 10 reps    GOALS: Goals reviewed with patient? Yes  SHORT TERM GOALS: Target date: 08/24/2022  Pt will be independent with HEP in order to demonstrate increased ability to perform tasks related to occupation/hobbies. Baseline: Not given HEP at IE. Goal status: INITIAL   LONG TERM GOALS: Target date: 10/19/2022  1.  Patient (> 60 years old) will complete five times sit to stand test in < 15 seconds indicating an increased LE strength and improved balance. Baseline: 18.59 sec; 06/06/2022= 13.55 sec  07/18/22: 14.36 Goal status: GOAL MET  2.  Patient will increase FOTO score to equal to or greater than 53 to demonstrate  statistically significant improvement in mobility and quality of life.  Baseline: 44; 06/06/2022= 51 07/18/22: 52/53 Goal status: PROGRESSING   3.  Patient will increase Berg Balance score by > 6 points to demonstrate decreased fall risk during functional activities. Baseline: 46/56; 06/06/2022= 49/56 (however did not assess steps so likely higher and will need to complete next visit)  07/18/22: 54/56 Goal status: PROGRESSING   4.  Patient will reduce timed up and go to <11 seconds to reduce fall risk and demonstrate improved transfer/gait ability. Baseline: 18.42 06/06/2022= 17.39 sec without a device.  07/18/22: 14.65 sec Goal status: PROGRESSING   5.  Patient will increase 10 meter walk test to >1.61ms as to improve gait speed for better community ambulation and to reduce fall risk. Baseline: 0.68 m/s 07/18/22: 0.867m Goal status: PROGRESSING  6.  Patient will increase six minute walk test distance to >1000 for progression to community ambulator and improve gait ability Baseline: 565 ft, one standing rest break utilized at 5 min; 06/06/22= 615 feet 07/19/22:  512 ft on seated rest break at 5 min Goal status: PROGRESSING    ASSESSMENT:  CLINICAL IMPRESSION:  Pt required a few more rest breaks during treatment session today, but overall put forth great effort throughout the session.  Pt performed did experience some SOB with ambulation around the gym and following the NuStep exercise, but vitals remained within normal limits.  Pt continues to have good O2 saturation levels and HR is well within normal range following exercise.  Pt to continue to be seen under current POC until pt has heart procedure.   Pt will continue to benefit from skilled therapy to address remaining deficits in order to improve overall QoL and return to PLOF.  OBJECTIVE IMPAIRMENTS Abnormal gait, decreased activity tolerance, decreased balance, decreased mobility, difficulty walking, and decreased strength.    ACTIVITY LIMITATIONS lifting, bending, squatting, stairs, transfers, and reach over head  PARTICIPATION LIMITATIONS: cleaning, laundry, community activity, and yard work  [place] Age, Past/current experiences, and Time since onset of injury/illness/exacerbation are also affecting patient's functional outcome.   REHAB POTENTIAL: Good  CLINICAL DECISION MAKING: Stable/uncomplicated  EVALUATION COMPLEXITY: Low   PLAN:  PT FREQUENCY: 2x/week  PT DURATION: 12 weeks  PLANNED INTERVENTIONS: Therapeutic exercises, Therapeutic activity, Neuromuscular re-education, Balance training, Gait training, Patient/Family education, Self Care, Joint mobilization, and Stair training  PLAN FOR NEXT SESSION:   Continue to improve LE strength and work on balance with task oriented items.    Gwenlyn Saran, PT, DPT Physical Therapist- [facility]  07/27/22, 5:37 PM

## 2022-08-01 ENCOUNTER — Ambulatory Visit: Payer: Medicare HMO

## 2022-08-01 DIAGNOSIS — R269 Unspecified abnormalities of gait and mobility: Secondary | ICD-10-CM

## 2022-08-01 DIAGNOSIS — R293 Abnormal posture: Secondary | ICD-10-CM | POA: Diagnosis not present

## 2022-08-01 DIAGNOSIS — M6281 Muscle weakness (generalized): Secondary | ICD-10-CM | POA: Diagnosis not present

## 2022-08-01 DIAGNOSIS — R2681 Unsteadiness on feet: Secondary | ICD-10-CM | POA: Diagnosis not present

## 2022-08-01 DIAGNOSIS — R262 Difficulty in walking, not elsewhere classified: Secondary | ICD-10-CM

## 2022-08-01 NOTE — Therapy (Signed)
OUTPATIENT PHYSICAL THERAPY TREATMENT NOTE   Patient Name: April Rogers MRN: [id number] DOB:01/08/1941, 82 y.o., female Today's Date: 08/01/2022   PCP: Adin Hector, MD REFERRING PROVIDER: Adin Hector, MD   PT End of Session - 08/01/22 1607     Visit Number 23    Number of Visits 40    Date for PT Re-Evaluation 09/14/22    Authorization Type [employer]    Authorization Time Period 04/25/22-07/18/22; 07/18/22-09/14/22    Authorization - Number of Visits 24    Progress Note Due on Visit 30    PT Start Time 0403    PT Stop Time 0448    PT Time Calculation (min) 45 min    Equipment Utilized During Treatment Gait belt    Activity Tolerance Patient tolerated treatment well    Behavior During Therapy WFL for tasks assessed/performed               Past Medical History:  Diagnosis Date   Anxiety    Arthritis    Cancer ([place])    skin cancer   Complication of anesthesia    general anesthesia hard on memory after pt goes home   Depression    Fracture of one rib    GERD (gastroesophageal reflux disease)    was on nexium but no longer needs to take this medication   Hypertension    Macular degeneration of both eyes    Osteoporosis    Past Surgical History:  Procedure Laterality Date   ARM SKIN LESION BIOPSY / EXCISION Right    CHOLECYSTECTOMY     COLONOSCOPY WITH PROPOFOL N/A 08/03/2015   Procedure: COLONOSCOPY WITH PROPOFOL;  Surgeon: Hulen Luster, MD;  Location: [facility] ENDOSCOPY;  Service: Gastroenterology;  Laterality: N/A;   INTRAMEDULLARY (IM) NAIL INTERTROCHANTERIC Right 11/26/2015   Procedure: INTRAMEDULLARY (IM) NAIL INTERTROCHANTRIC;  Surgeon: Earnestine Leys, MD;  Location: [facility] ORS;  Service: Orthopedics;  Laterality: Right;   KYPHOPLASTY N/A 08/16/2019   Procedure: L4 KYPHOPLASTY;  Surgeon: Hessie Knows, MD;  Location: [facility] ORS;  Service: Orthopedics;  Laterality: N/A;   KYPHOPLASTY N/A 08/29/2019   Procedure: L2 KYPHOPLASTY;  Surgeon: Hessie Knows, MD;   Location: [facility] ORS;  Service: Orthopedics;  Laterality: N/A;   KYPHOPLASTY N/A 10/10/2019   Procedure: L3 KYPHOPLASTY;  Surgeon: Hessie Knows, MD;  Location: [facility] ORS;  Service: Orthopedics;  Laterality: N/A;   open heart surgery  2019   valve replacement, aortic arch  aneurysm repair   TONSILLECTOMY     TUBAL LIGATION     WRIST RECONSTRUCTION Right    Patient Active Problem List   Diagnosis Date Noted   Femur fracture, right, closed, initial encounter 11/25/2015    ONSET DATE: 04/05/22  REFERRING DIAG: R26.0 (ICD-10-CM) - Ataxic gait  THERAPY DIAG:  Difficulty in walking, not elsewhere classified  Muscle weakness (generalized)  Unsteadiness on feet  Abnormality of gait and mobility  Abnormal posture  Rationale for Evaluation and Treatment Rehabilitation   SUBJECTIVE:  SUBJECTIVE STATEMENT:  Pt states she had a good weekend.  Pt notes that she is still trying to downsize her closets at home and she has too much "junk".   Pt accompanied by: family member, daughter, [name].  PERTINENT HISTORY: Pt is referred to clinic for decreased balance.  Pt and daughter note that she has been doing exercises at home, although not as frequent.  Pt's daughter notes that the pt's endurance has declined and her ability to navigate curbs/stairs has declined as well.  MD and pt both agreed for second bout of therapy in order to return to PLOF.  PAIN:  Are you having pain? No  PRECAUTIONS: Fall  WEIGHT BEARING RESTRICTIONS No  FALLS: Has patient fallen in last 6 months? No  LIVING ENVIRONMENT: Lives with: lives alone Lives in: House/apartment Stairs: No Has following equipment at home: Gilford Rile - 4 wheeled; utilizes the furniture or walls at home.  PLOF: Independent with basic ADLs; utilizes the  rollator in the morning until her vision and balance gets better in the morning.  PATIENT GOALS  Pt states that she would like to be able to go up 1-2 steps/curbs that she needs to navigate when visiting other people in the family/friends.  Pt wants to improve stamina and endurance when ambulating in order to be more functional at home.  Pt unable to use the rollator to get inside the door as much as she was prior.  Pt able to tolerate increased weight in the LE's.  Please do not increase weight when performing anything that might cause strain in the spine.    OBJECTIVE:   DIAGNOSTIC FINDINGS: None  COGNITION: Overall cognitive status: Within functional limits for tasks assessed   SENSATION: WFL  COORDINATION: WFL  POSTURE: rounded shoulders and forward head  LOWER EXTREMITY ROM:     WFL  LOWER EXTREMITY MMT:    MMT Right Eval Left Eval  Hip flexion 4 5  Hip abduction 4 4  Hip adduction 4 4  Knee flexion 4 4  Knee extension 5 5  Ankle dorsiflexion 4 4    GAIT: Gait pattern: step through pattern, trunk flexed, and wide BOS Assistive device utilized: None Level of assistance: SBA Comments: Pt with forward flexed posture during tests and measures necessary for goal assessments.  FUNCTIONAL TESTs:  5 times sit to stand: 18.59 sec Timed up and go (TUG): 18.42 sec 6 minute walk test: Not assessed during evaluation. 10 meter walk test: 14.63 sec Berg Balance Scale: Not assessed during evaluation Dynamic Gait Index: Not assessed during evaluation Functional gait assessment: Not assessed during evaluation FOTO: 44; Predicted 45   TODAY'S TREATMENT:    TherEx  STS with airex pad placed on floor for increased hip flexion and difficulty, x10 Seated LAQ, 4# AW donned, 2x15; 3 second holds at end range  Seated marches, 4# AW donned, 2x15; arms crossed and sitting away from back of chair for increased core activation  Ambulation around the gym x2 total laps with 4# AW  donned High Knees around the gym x1 total lap with 4# AW donned Lateral ambulation in long hallway, 40' x2 each direction with 4# AW donned  NuStep, seat 5, arms 7, interval training for improved cardiovascular performance Level 1 - 1.5 min Level 2 - 1 min Level 3 - 1.5 min Level 2 - 1 min Level 1 - 1.5 min      PATIENT EDUCATION: Education details: Pt educated on purpose of testing for functional outcomes and  discussed plan of care. Person educated: Patient and Child(ren) Education method: Explanation Education comprehension: verbalized understanding   HOME EXERCISE PROGRAM:  Access Code: [email or web address] URL: [email or web address] Date: 05/03/2022 Prepared by: [place]  Exercises - Standing Hip Abduction with Counter Support  - 1 x daily - 7 x weekly - 3 sets - 10 reps - Standing Hip Extension with Counter Support  - 1 x daily - 7 x weekly - 3 sets - 10 reps - Standing March with Counter Support  - 1 x daily - 7 x weekly - 3 sets - 10 reps - Heel Raises with Counter Support  - 1 x daily - 7 x weekly - 3 sets - 10 reps - Mini Squat with Counter Support  - 1 x daily - 7 x weekly - 3 sets - 10 reps - Seated Long Arc Quad  - 1 x daily - 7 x weekly - 3 sets - 10 reps    GOALS: Goals reviewed with patient? Yes  SHORT TERM GOALS: Target date: 08/29/2022  Pt will be independent with HEP in order to demonstrate increased ability to perform tasks related to occupation/hobbies. Baseline: Not given HEP at IE. Goal status: INITIAL   LONG TERM GOALS: Target date: 10/24/2022  1.  Patient (> 61 years old) will complete five times sit to stand test in < 15 seconds indicating an increased LE strength and improved balance. Baseline: 18.59 sec; 06/06/2022= 13.55 sec  07/18/22: 14.36 Goal status: GOAL MET  2.  Patient will increase FOTO score to equal to or greater than 53 to demonstrate statistically significant improvement in mobility and quality of life.  Baseline: 44;  06/06/2022= 51 07/18/22: 52/53 Goal status: PROGRESSING   3.  Patient will increase Berg Balance score by > 6 points to demonstrate decreased fall risk during functional activities. Baseline: 46/56; 06/06/2022= 49/56 (however did not assess steps so likely higher and will need to complete next visit)  07/18/22: 54/56 Goal status: PROGRESSING   4.  Patient will reduce timed up and go to <11 seconds to reduce fall risk and demonstrate improved transfer/gait ability. Baseline: 18.42 06/06/2022= 17.39 sec without a device.  07/18/22: 14.65 sec Goal status: PROGRESSING   5.  Patient will increase 10 meter walk test to >1.8ms as to improve gait speed for better community ambulation and to reduce fall risk. Baseline: 0.68 m/s 07/18/22: 0.825m Goal status: PROGRESSING  6.  Patient will increase six minute walk test distance to >1000 for progression to community ambulator and improve gait ability Baseline: 565 ft, one standing rest break utilized at 5 min; 06/06/22= 615 feet 07/19/22:  512 ft on seated rest break at 5 min Goal status: PROGRESSING    ASSESSMENT:  CLINICAL IMPRESSION:  Pt continues to perform well and was able to tolerate increased endurance level training during the session today.  Pt tolerated more standing exercises along with 4# AW donned.  Pt is making steady improvements in gait and mechanics with exercises.  Pt to benefit from neuro training at next visit and then mixture of two prior to her surgery.   Pt will continue to benefit from skilled therapy to address remaining deficits in order to improve overall QoL and return to PLOF.        OBJECTIVE IMPAIRMENTS Abnormal gait, decreased activity tolerance, decreased balance, decreased mobility, difficulty walking, and decreased strength.   ACTIVITY LIMITATIONS lifting, bending, squatting, stairs, transfers, and reach over head  PARTICIPATION LIMITATIONS: cleaning, laundry, community activity, and yard work  PERSONAL  FACTORS Age, Past/current experiences, and Time since onset of injury/illness/exacerbation are also affecting patient's functional outcome.   REHAB POTENTIAL: Good  CLINICAL DECISION MAKING: Stable/uncomplicated  EVALUATION COMPLEXITY: Low   PLAN:  PT FREQUENCY: 2x/week  PT DURATION: 12 weeks  PLANNED INTERVENTIONS: Therapeutic exercises, Therapeutic activity, Neuromuscular re-education, Balance training, Gait training, Patient/Family education, Self Care, Joint mobilization, and Stair training  PLAN FOR NEXT SESSION:  NMR for next visit, and final visit will include mixture of both. Continue to improve LE strength and work on balance with task oriented items.    Gwenlyn Saran, PT, DPT Physical Therapist- [facility]  08/01/22, 5:26 PM

## 2022-08-01 NOTE — Therapy (Incomplete)
OUTPATIENT PHYSICAL THERAPY TREATMENT NOTE   Patient Name: April Rogers MRN: [id number] DOB:1940-08-27, 82 y.o., female Today's Date: 08/01/2022   PCP: Adin Hector, MD REFERRING PROVIDER: Adin Hector, MD      Past Medical History:  Diagnosis Date   Anxiety    Arthritis    Cancer [facility])    skin cancer   Complication of anesthesia    general anesthesia hard on memory after pt goes home   Depression    Fracture of one rib    GERD (gastroesophageal reflux disease)    was on nexium but no longer needs to take this medication   Hypertension    Macular degeneration of both eyes    Osteoporosis    Past Surgical History:  Procedure Laterality Date   ARM SKIN LESION BIOPSY / EXCISION Right    CHOLECYSTECTOMY     COLONOSCOPY WITH PROPOFOL N/A 08/03/2015   Procedure: COLONOSCOPY WITH PROPOFOL;  Surgeon: Hulen Luster, MD;  Location: [facility] ENDOSCOPY;  Service: Gastroenterology;  Laterality: N/A;   INTRAMEDULLARY (IM) NAIL INTERTROCHANTERIC Right 11/26/2015   Procedure: INTRAMEDULLARY (IM) NAIL INTERTROCHANTRIC;  Surgeon: Earnestine Leys, MD;  Location: [facility] ORS;  Service: Orthopedics;  Laterality: Right;   KYPHOPLASTY N/A 08/16/2019   Procedure: L4 KYPHOPLASTY;  Surgeon: Hessie Knows, MD;  Location: [facility] ORS;  Service: Orthopedics;  Laterality: N/A;   KYPHOPLASTY N/A 08/29/2019   Procedure: L2 KYPHOPLASTY;  Surgeon: Hessie Knows, MD;  Location: [facility] ORS;  Service: Orthopedics;  Laterality: N/A;   KYPHOPLASTY N/A 10/10/2019   Procedure: L3 KYPHOPLASTY;  Surgeon: Hessie Knows, MD;  Location: [facility] ORS;  Service: Orthopedics;  Laterality: N/A;   open heart surgery  2019   valve replacement, aortic arch  aneurysm repair   TONSILLECTOMY     TUBAL LIGATION     WRIST RECONSTRUCTION Right    Patient Active Problem List   Diagnosis Date Noted   Femur fracture, right, closed, initial encounter 11/25/2015    ONSET DATE: 04/05/22  REFERRING DIAG: R26.0 (ICD-10-CM) - Ataxic  gait  THERAPY DIAG:  No diagnosis found.  Rationale for Evaluation and Treatment Rehabilitation   SUBJECTIVE:                                                                                                                                                                                             SUBJECTIVE STATEMENT: *** Pt is having a good day today.  Pt is feeling much better than she was on Tuesday.   Pt accompanied by: family member, daughter, [name].  PERTINENT HISTORY: Pt is referred to clinic for decreased balance.  Pt and daughter note that she has been doing exercises at home, although not as frequent.  Pt's daughter notes that the pt's endurance has declined and her ability to navigate curbs/stairs has declined as well.  MD and pt both agreed for second bout of therapy in order to return to PLOF.  PAIN:  Are you having pain? No  PRECAUTIONS: Fall  WEIGHT BEARING RESTRICTIONS No  FALLS: Has patient fallen in last 6 months? No  LIVING ENVIRONMENT: Lives with: lives alone Lives in: House/apartment Stairs: No Has following equipment at home: Gilford Rile - 4 wheeled; utilizes the furniture or walls at home.  PLOF: Independent with basic ADLs; utilizes the rollator in the morning until her vision and balance gets better in the morning.  PATIENT GOALS  Pt states that she would like to be able to go up 1-2 steps/curbs that she needs to navigate when visiting other people in the family/friends.  Pt wants to improve stamina and endurance when ambulating in order to be more functional at home.  Pt unable to use the rollator to get inside the door as much as she was prior.  Pt able to tolerate increased weight in the LE's.  Please do not increase weight when performing anything that might cause strain in the spine.    OBJECTIVE:   DIAGNOSTIC FINDINGS: None  COGNITION: Overall cognitive status: Within functional limits for tasks  assessed   SENSATION: WFL  COORDINATION: WFL  POSTURE: rounded shoulders and forward head  LOWER EXTREMITY ROM:     WFL  LOWER EXTREMITY MMT:    MMT Right Eval Left Eval  Hip flexion 4 5  Hip abduction 4 4  Hip adduction 4 4  Knee flexion 4 4  Knee extension 5 5  Ankle dorsiflexion 4 4    GAIT: Gait pattern: step through pattern, trunk flexed, and wide BOS Assistive device utilized: None Level of assistance: SBA Comments: Pt with forward flexed posture during tests and measures necessary for goal assessments.  FUNCTIONAL TESTs:  5 times sit to stand: 18.59 sec Timed up and go (TUG): 18.42 sec 6 minute walk test: Not assessed during evaluation. 10 meter walk test: 14.63 sec Berg Balance Scale: Not assessed during evaluation Dynamic Gait Index: Not assessed during evaluation Functional gait assessment: Not assessed during evaluation FOTO: 44; Predicted 53   TODAY'S TREATMENT:   ***  TherEx  Seated LAQ, 4# AW donned, 2x15; 3 second holds at end range  Seated marches, 4# AW donned, 2x15; arms crossed and sitting away from back of chair for increased core activation  Ambulation around the gym x2 total laps while wearing 4# AW donned  NuStep, seat 5, arms 7, interval training for improved cardiovascular performance Level 1 - 1 min Level 3 - 1 min Level 2 - 1 min Level 4 - 1 min Level 3 - 1 min Level 2 - 1 min Level 1 - 1 min as cool-off period      Neuro   Activity Description: Pt performed random taps utilizing (Green Light  = R UE/LE; Blue Light = L UE/LE) Activity Setting:  Random Number of Pods:  6 Cycles/Sets:  3 Duration (Time or Hit Count):  30 hits  Patient Stats  Reaction Time:   2,251 1,728 2,197       PATIENT EDUCATION: Education details: Pt educated on purpose of testing for functional outcomes and discussed plan of care. Person educated: Patient and Child(ren) Education method: Explanation Education comprehension:  verbalized understanding  HOME EXERCISE PROGRAM:  Access Code: [email or web address] URL: [email or web address] Date: 05/03/2022 Prepared by: [place] [employer]  Exercises - Standing Hip Abduction with Counter Support  - 1 x daily - 7 x weekly - 3 sets - 10 reps - Standing Hip Extension with Counter Support  - 1 x daily - 7 x weekly - 3 sets - 10 reps - Standing March with Counter Support  - 1 x daily - 7 x weekly - 3 sets - 10 reps - Heel Raises with Counter Support  - 1 x daily - 7 x weekly - 3 sets - 10 reps - Mini Squat with Counter Support  - 1 x daily - 7 x weekly - 3 sets - 10 reps - Seated Long Arc Quad  - 1 x daily - 7 x weekly - 3 sets - 10 reps    GOALS: Goals reviewed with patient? Yes  SHORT TERM GOALS: Target date: 08/29/2022  Pt will be independent with HEP in order to demonstrate increased ability to perform tasks related to occupation/hobbies. Baseline: Not given HEP at IE. Goal status: INITIAL   LONG TERM GOALS: Target date: 10/24/2022  1.  Patient (> 65 years old) will complete five times sit to stand test in < 15 seconds indicating an increased LE strength and improved balance. Baseline: 18.59 sec; 06/06/2022= 13.55 sec  07/18/22: 14.36 Goal status: GOAL MET  2.  Patient will increase FOTO score to equal to or greater than 53 to demonstrate statistically significant improvement in mobility and quality of life.  Baseline: 44; 06/06/2022= 51 07/18/22: 52/53 Goal status: PROGRESSING   3.  Patient will increase Berg Balance score by > 6 points to demonstrate decreased fall risk during functional activities. Baseline: 46/56; 06/06/2022= 49/56 (however did not assess steps so likely higher and will need to complete next visit)  07/18/22: 54/56 Goal status: PROGRESSING   4.  Patient will reduce timed up and go to <11 seconds to reduce fall risk and demonstrate improved transfer/gait ability. Baseline: 18.42 06/06/2022= 17.39 sec without a device.  07/18/22: 14.65  sec Goal status: PROGRESSING   5.  Patient will increase 10 meter walk test to >1.30ms as to improve gait speed for better community ambulation and to reduce fall risk. Baseline: 0.68 m/s 07/18/22: 0.869m Goal status: PROGRESSING  6.  Patient will increase six minute walk test distance to >1000 for progression to community ambulator and improve gait ability Baseline: 565 ft, one standing rest break utilized at 5 min; 06/06/22= 615 feet 07/19/22:  512 ft on seated rest break at 5 min Goal status: PROGRESSING    ASSESSMENT:  CLINICAL IMPRESSION: *** Pt required a few more rest breaks during treatment session today, but overall put forth great effort throughout the session.  Pt performed did experience some SOB with ambulation around the gym and following the NuStep exercise, but vitals remained within normal limits.  Pt continues to have good O2 saturation levels and HR is well within normal range following exercise.  Pt to continue to be seen under current POC until pt has heart procedure.   Pt will continue to benefit from skilled therapy to address remaining deficits in order to improve overall QoL and return to PLOF.        OBJECTIVE IMPAIRMENTS Abnormal gait, decreased activity tolerance, decreased balance, decreased mobility, difficulty walking, and decreased strength.   ACTIVITY LIMITATIONS lifting, bending, squatting, stairs, transfers, and reach over head  PARTICIPATION LIMITATIONS: cleaning, laundry, community activity, and yard work  PERSONAL  FACTORS Age, Past/current experiences, and Time since onset of injury/illness/exacerbation are also affecting patient's functional outcome.   REHAB POTENTIAL: Good  CLINICAL DECISION MAKING: Stable/uncomplicated  EVALUATION COMPLEXITY: Low   PLAN:  PT FREQUENCY: 2x/week  PT DURATION: 12 weeks  PLANNED INTERVENTIONS: Therapeutic exercises, Therapeutic activity, Neuromuscular re-education, Balance training, Gait training,  Patient/Family education, Self Care, Joint mobilization, and Stair training  PLAN FOR NEXT SESSION:  *** Continue to improve LE strength and work on balance with task oriented items.    Gwenlyn Saran, PT, DPT Physical Therapist- [facility]  08/01/22, 8:41 AM

## 2022-08-03 ENCOUNTER — Ambulatory Visit: Payer: Medicare HMO

## 2022-08-03 DIAGNOSIS — M6281 Muscle weakness (generalized): Secondary | ICD-10-CM

## 2022-08-03 DIAGNOSIS — R293 Abnormal posture: Secondary | ICD-10-CM | POA: Diagnosis not present

## 2022-08-03 DIAGNOSIS — R269 Unspecified abnormalities of gait and mobility: Secondary | ICD-10-CM | POA: Diagnosis not present

## 2022-08-03 DIAGNOSIS — R262 Difficulty in walking, not elsewhere classified: Secondary | ICD-10-CM

## 2022-08-03 DIAGNOSIS — R2681 Unsteadiness on feet: Secondary | ICD-10-CM | POA: Diagnosis not present

## 2022-08-03 NOTE — Therapy (Signed)
OUTPATIENT PHYSICAL THERAPY TREATMENT NOTE   Patient Name: April Rogers MRN: [id number] DOB:[birth year], 82 y.o., female Today's Date: 08/03/2022   PCP: Adin Hector, MD REFERRING PROVIDER: Adin Hector, MD   PT End of Session - 08/03/22 1517     Visit Number 24    Number of Visits 40    Date for PT Re-Evaluation 09/14/22    Authorization Type [employer]    Authorization Time Period 04/25/22-07/18/22; 07/18/22-09/14/22    Authorization - Number of Visits 24    Progress Note Due on Visit 30    PT Start Time 1517    PT Stop Time 1600    PT Time Calculation (min) 43 min    Equipment Utilized During Treatment Gait belt    Activity Tolerance Patient tolerated treatment well    Behavior During Therapy WFL for tasks assessed/performed                Past Medical History:  Diagnosis Date   Anxiety    Arthritis    Cancer ([place])    skin cancer   Complication of anesthesia    general anesthesia hard on memory after pt goes home   Depression    Fracture of one rib    GERD (gastroesophageal reflux disease)    was on nexium but no longer needs to take this medication   Hypertension    Macular degeneration of both eyes    Osteoporosis    Past Surgical History:  Procedure Laterality Date   ARM SKIN LESION BIOPSY / EXCISION Right    CHOLECYSTECTOMY     COLONOSCOPY WITH PROPOFOL N/A 08/03/2015   Procedure: COLONOSCOPY WITH PROPOFOL;  Surgeon: Hulen Luster, MD;  Location: [facility] ENDOSCOPY;  Service: Gastroenterology;  Laterality: N/A;   INTRAMEDULLARY (IM) NAIL INTERTROCHANTERIC Right 11/26/2015   Procedure: INTRAMEDULLARY (IM) NAIL INTERTROCHANTRIC;  Surgeon: Earnestine Leys, MD;  Location: [facility] ORS;  Service: Orthopedics;  Laterality: Right;   KYPHOPLASTY N/A 08/16/2019   Procedure: L4 KYPHOPLASTY;  Surgeon: Hessie Knows, MD;  Location: [facility] ORS;  Service: Orthopedics;  Laterality: N/A;   KYPHOPLASTY N/A 08/29/2019   Procedure: L2 KYPHOPLASTY;  Surgeon: Hessie Knows, MD;   Location: [facility] ORS;  Service: Orthopedics;  Laterality: N/A;   KYPHOPLASTY N/A 10/10/2019   Procedure: L3 KYPHOPLASTY;  Surgeon: Hessie Knows, MD;  Location: [facility] ORS;  Service: Orthopedics;  Laterality: N/A;   open heart surgery  2019   valve replacement, aortic arch  aneurysm repair   TONSILLECTOMY     TUBAL LIGATION     WRIST RECONSTRUCTION Right    Patient Active Problem List   Diagnosis Date Noted   Femur fracture, right, closed, initial encounter 11/25/2015    ONSET DATE: 04/05/22  REFERRING DIAG: R26.0 (ICD-10-CM) - Ataxic gait  THERAPY DIAG:  Difficulty in walking, not elsewhere classified  Muscle weakness (generalized)  Unsteadiness on feet  Abnormality of gait and mobility  Abnormal posture  Rationale for Evaluation and Treatment Rehabilitation   SUBJECTIVE:  SUBJECTIVE STATEMENT:  Pt and daughter report that she's having a good day today.     Pt accompanied by: family member, daughter, [name].  PERTINENT HISTORY: Pt is referred to clinic for decreased balance.  Pt and daughter note that she has been doing exercises at home, although not as frequent.  Pt's daughter notes that the pt's endurance has declined and her ability to navigate curbs/stairs has declined as well.  MD and pt both agreed for second bout of therapy in order to return to PLOF.  PAIN:  Are you having pain? No  PRECAUTIONS: Fall  WEIGHT BEARING RESTRICTIONS No  FALLS: Has patient fallen in last 6 months? No  LIVING ENVIRONMENT: Lives with: lives alone Lives in: House/apartment Stairs: No Has following equipment at home: Gilford Rile - 4 wheeled; utilizes the furniture or walls at home.  PLOF: Independent with basic ADLs; utilizes the rollator in the morning until her vision and balance gets better in the  morning.  PATIENT GOALS  Pt states that she would like to be able to go up 1-2 steps/curbs that she needs to navigate when visiting other people in the family/friends.  Pt wants to improve stamina and endurance when ambulating in order to be more functional at home.  Pt unable to use the rollator to get inside the door as much as she was prior.  Pt able to tolerate increased weight in the LE's.  Please do not increase weight when performing anything that might cause strain in the spine.    OBJECTIVE:   DIAGNOSTIC FINDINGS: None  COGNITION: Overall cognitive status: Within functional limits for tasks assessed   SENSATION: WFL  COORDINATION: WFL  POSTURE: rounded shoulders and forward head  LOWER EXTREMITY ROM:     WFL  LOWER EXTREMITY MMT:    MMT Right Eval Left Eval  Hip flexion 4 5  Hip abduction 4 4  Hip adduction 4 4  Knee flexion 4 4  Knee extension 5 5  Ankle dorsiflexion 4 4    GAIT: Gait pattern: step through pattern, trunk flexed, and wide BOS Assistive device utilized: None Level of assistance: SBA Comments: Pt with forward flexed posture during tests and measures necessary for goal assessments.  FUNCTIONAL TESTs:  5 times sit to stand: 18.59 sec Timed up and go (TUG): 18.42 sec 6 minute walk test: Not assessed during evaluation. 10 meter walk test: 14.63 sec Berg Balance Scale: Not assessed during evaluation Dynamic Gait Index: Not assessed during evaluation Functional gait assessment: Not assessed during evaluation FOTO: 44; Predicted 53   TODAY'S TREATMENT:   Neuro:  Activity Description: Random tapping on the multicolored pods with B LE, going for speed. Activity Setting:  Random Number of Pods:  6 Cycles/Sets:  2 Duration (Time or Hit Count):  30  Patient Stats  Reaction Time:   3,145 2,599  Korebalance Tux Racer, Practice Event Bronze Section, 6 min 2 sec, 21 herring caught  Staggered stance with supportive LE on ground, leading  LE on airex pad and lifting rainbow physioball up and above head, x10 each LE leading Staggered stance with supporting LE on airex pad, leading LE on 6" step and lifting rainbow physioball up and above head, x10 each LE leading Lateral weight shifts without UE support while on rocker board, 2x15      PATIENT EDUCATION: Education details: Pt educated on purpose of testing for functional outcomes and discussed plan of care. Person educated: Patient and Child(ren) Education method: Explanation Education comprehension: verbalized understanding  HOME EXERCISE PROGRAM:  Access Code: [email or web address] URL: [email or web address] Date: 05/03/2022 Prepared by: [place]  Exercises - Standing Hip Abduction with Counter Support  - 1 x daily - 7 x weekly - 3 sets - 10 reps - Standing Hip Extension with Counter Support  - 1 x daily - 7 x weekly - 3 sets - 10 reps - Standing March with Counter Support  - 1 x daily - 7 x weekly - 3 sets - 10 reps - Heel Raises with Counter Support  - 1 x daily - 7 x weekly - 3 sets - 10 reps - Mini Squat with Counter Support  - 1 x daily - 7 x weekly - 3 sets - 10 reps - Seated Long Arc Quad  - 1 x daily - 7 x weekly - 3 sets - 10 reps    GOALS: Goals reviewed with patient? Yes  SHORT TERM GOALS: Target date: 08/31/2022  Pt will be independent with HEP in order to demonstrate increased ability to perform tasks related to occupation/hobbies. Baseline: Not given HEP at IE. Goal status: INITIAL   LONG TERM GOALS: Target date: 10/26/2022  1.  Patient (> 27 years old) will complete five times sit to stand test in < 15 seconds indicating an increased LE strength and improved balance. Baseline: 18.59 sec; 06/06/2022= 13.55 sec  07/18/22: 14.36 Goal status: GOAL MET  2.  Patient will increase FOTO score to equal to or greater than 53 to demonstrate statistically significant improvement in mobility and quality of life.  Baseline: 44; 06/06/2022=  51 07/18/22: 52/53 Goal status: PROGRESSING   3.  Patient will increase Berg Balance score by > 6 points to demonstrate decreased fall risk during functional activities. Baseline: 46/56; 06/06/2022= 49/56 (however did not assess steps so likely higher and will need to complete next visit)  07/18/22: 54/56 Goal status: PROGRESSING   4.  Patient will reduce timed up and go to <11 seconds to reduce fall risk and demonstrate improved transfer/gait ability. Baseline: 18.42 06/06/2022= 17.39 sec without a device.  07/18/22: 14.65 sec Goal status: PROGRESSING   5.  Patient will increase 10 meter walk test to >1.44ms as to improve gait speed for better community ambulation and to reduce fall risk. Baseline: 0.68 m/s 07/18/22: 0.817m Goal status: PROGRESSING  6.  Patient will increase six minute walk test distance to >1000 for progression to community ambulator and improve gait ability Baseline: 565 ft, one standing rest break utilized at 5 min; 06/06/22= 615 feet 07/19/22:  512 ft on seated rest break at 5 min Goal status: PROGRESSING    ASSESSMENT:  CLINICAL IMPRESSION:  Pt able to perform balance related tasks well and puts forth great effort throughout the session.  Pt challenged by the tasks but feels much more confident with the tasks and is becoming more self aware of her balance deficits.  Pt to continue with balance related tasks in future session to improve functional mobility and reduce fear of falling.   Pt will continue to benefit from skilled therapy to address remaining deficits in order to improve overall QoL and return to PLOF.      OBJECTIVE IMPAIRMENTS Abnormal gait, decreased activity tolerance, decreased balance, decreased mobility, difficulty walking, and decreased strength.   ACTIVITY LIMITATIONS lifting, bending, squatting, stairs, transfers, and reach over head  PARTICIPATION LIMITATIONS: cleaning, laundry, community activity, and yard work  [place],  Past/current experiences, and Time since onset of injury/illness/exacerbation are also affecting patient's functional outcome.  REHAB POTENTIAL: Good  CLINICAL DECISION MAKING: Stable/uncomplicated  EVALUATION COMPLEXITY: Low   PLAN:  PT FREQUENCY: 2x/week  PT DURATION: 12 weeks  PLANNED INTERVENTIONS: Therapeutic exercises, Therapeutic activity, Neuromuscular re-education, Balance training, Gait training, Patient/Family education, Self Care, Joint mobilization, and Stair training  PLAN FOR NEXT SESSION:   Final visit will include mixture of both neuro and therex.  Attempt to perform NuStep. Continue to improve LE strength and work on balance with task oriented items.    Gwenlyn Saran, PT, DPT Physical Therapist- [facility]  08/03/22, 5:18 PM

## 2022-08-08 ENCOUNTER — Ambulatory Visit: Payer: Medicare HMO

## 2022-08-08 DIAGNOSIS — R2681 Unsteadiness on feet: Secondary | ICD-10-CM

## 2022-08-08 DIAGNOSIS — R269 Unspecified abnormalities of gait and mobility: Secondary | ICD-10-CM

## 2022-08-08 DIAGNOSIS — M6281 Muscle weakness (generalized): Secondary | ICD-10-CM

## 2022-08-08 DIAGNOSIS — R293 Abnormal posture: Secondary | ICD-10-CM

## 2022-08-08 DIAGNOSIS — R262 Difficulty in walking, not elsewhere classified: Secondary | ICD-10-CM

## 2022-08-08 NOTE — Therapy (Signed)
OUTPATIENT PHYSICAL THERAPY TREATMENT NOTE   Patient Name: April Rogers MRN: [id number] DOB:1940/10/15, 82 y.o., female Today's Date: 08/08/2022   PCP: Adin Hector, MD REFERRING PROVIDER: Adin Hector, MD   PT End of Session - 08/08/22 1350     Visit Number 25    Number of Visits 40    Date for PT Re-Evaluation 09/14/22    Authorization Type [employer]    Authorization Time Period 04/25/22-07/18/22; 07/18/22-09/14/22    Authorization - Number of Visits 25    Progress Note Due on Visit 30    PT Start Time 1346    PT Stop Time 1430    PT Time Calculation (min) 44 min    Equipment Utilized During Treatment Gait belt    Activity Tolerance Patient tolerated treatment well    Behavior During Therapy WFL for tasks assessed/performed                 Past Medical History:  Diagnosis Date   Anxiety    Arthritis    Cancer ([place])    skin cancer   Complication of anesthesia    general anesthesia hard on memory after pt goes home   Depression    Fracture of one rib    GERD (gastroesophageal reflux disease)    was on nexium but no longer needs to take this medication   Hypertension    Macular degeneration of both eyes    Osteoporosis    Past Surgical History:  Procedure Laterality Date   ARM SKIN LESION BIOPSY / EXCISION Right    CHOLECYSTECTOMY     COLONOSCOPY WITH PROPOFOL N/A 08/03/2015   Procedure: COLONOSCOPY WITH PROPOFOL;  Surgeon: Hulen Luster, MD;  Location[facility] ENDOSCOPY;  Service: Gastroenterology;  Laterality: N/A;   INTRAMEDULLARY (IM) NAIL INTERTROCHANTERIC Right 11/26/2015   Procedure: INTRAMEDULLARY (IM) NAIL INTERTROCHANTRIC;  Surgeon: Earnestine Leys, MD;  Location: [facility] ORS;  Service: Orthopedics;  Laterality: Right;   KYPHOPLASTY N/A 08/16/2019   Procedure: L4 KYPHOPLASTY;  Surgeon: Hessie Knows, MD;  Location: [facility] ORS;  Service: Orthopedics;  Laterality: N/A;   KYPHOPLASTY N/A 08/29/2019   Procedure: L2 KYPHOPLASTY;  Surgeon: Hessie Knows, MD;   Location: [facility] ORS;  Service: Orthopedics;  Laterality: N/A;   KYPHOPLASTY N/A 10/10/2019   Procedure: L3 KYPHOPLASTY;  Surgeon: Hessie Knows, MD;  Location: [facility] ORS;  Service: Orthopedics;  Laterality: N/A;   open heart surgery  2019   valve replacement, aortic arch  aneurysm repair   TONSILLECTOMY     TUBAL LIGATION     WRIST RECONSTRUCTION Right    Patient Active Problem List   Diagnosis Date Noted   Femur fracture, right, closed, initial encounter 11/25/2015    ONSET DATE: 04/05/22  REFERRING DIAG: R26.0 (ICD-10-CM) - Ataxic gait  THERAPY DIAG:  Difficulty in walking, not elsewhere classified  Muscle weakness (generalized)  Unsteadiness on feet  Abnormality of gait and mobility  Abnormal posture  Rationale for Evaluation and Treatment Rehabilitation   SUBJECTIVE:  SUBJECTIVE STATEMENT:  Pt reports that she had a good weekend and that her family was able to come over yesterday (Sunday) and spent some quality time with them.  Pt notes that she is doing well otherwise and denies any falls.  Pt is preparing to be admitted Wednesday for her heart procedure on Thursday.   Pt accompanied by: family member, daughter, Alyse Low.  PERTINENT HISTORY: Pt is referred to clinic for decreased balance.  Pt and daughter note that she has been doing exercises at home, although not as frequent.  Pt's daughter notes that the pt's endurance has declined and her ability to navigate curbs/stairs has declined as well.  MD and pt both agreed for second bout of therapy in order to return to PLOF.  PAIN:  Are you having pain? No  PRECAUTIONS: Fall  WEIGHT BEARING RESTRICTIONS No  FALLS: Has patient fallen in last 6 months? No  LIVING ENVIRONMENT: Lives with: lives alone Lives in: House/apartment Stairs:  No Has following equipment at home: Gilford Rile - 4 wheeled; utilizes the furniture or walls at home.  PLOF: Independent with basic ADLs; utilizes the rollator in the morning until her vision and balance gets better in the morning.  PATIENT GOALS  Pt states that she would like to be able to go up 1-2 steps/curbs that she needs to navigate when visiting other people in the family/friends.  Pt wants to improve stamina and endurance when ambulating in order to be more functional at home.  Pt unable to use the rollator to get inside the door as much as she was prior.  Pt able to tolerate increased weight in the LE's.  Please do not increase weight when performing anything that might cause strain in the spine.    OBJECTIVE:   DIAGNOSTIC FINDINGS: None  COGNITION: Overall cognitive status: Within functional limits for tasks assessed   SENSATION: WFL  COORDINATION: WFL  POSTURE: rounded shoulders and forward head  LOWER EXTREMITY ROM:     WFL  LOWER EXTREMITY MMT:    MMT Right Eval Left Eval  Hip flexion 4 5  Hip abduction 4 4  Hip adduction 4 4  Knee flexion 4 4  Knee extension 5 5  Ankle dorsiflexion 4 4    GAIT: Gait pattern: step through pattern, trunk flexed, and wide BOS Assistive device utilized: None Level of assistance: SBA Comments: Pt with forward flexed posture during tests and measures necessary for goal assessments.  FUNCTIONAL TESTs:  5 times sit to stand: 18.59 sec Timed up and go (TUG): 18.42 sec 6 minute walk test: Not assessed during evaluation. 10 meter walk test: 14.63 sec Berg Balance Scale: Not assessed during evaluation Dynamic Gait Index: Not assessed during evaluation Functional gait assessment: Not assessed during evaluation FOTO: 44; Predicted 53   TODAY'S TREATMENT:    TherEx:  NuStep, seat 7, arms 8, interval training for improved cardiovascular performance Level 1 - 1 min Level 2 - 1 min Level 3 - 1 min Level 4 - 1 min Level 3 - 1  min Level 2 - 1 min Level 1 - 2 min as cool-off period  Seated LAQ, 5# AW donned, 2x15 Seated marches, 5# AW donned, 2x15  Ambulation around the gym including high knees, lateral steps, and backwards walking while wearing 5# AW    PATIENT EDUCATION: Education details: Pt educated on purpose of testing for functional outcomes and discussed plan of care. Person educated: Patient and Child(ren) Education method: Explanation Education comprehension: verbalized understanding  HOME EXERCISE PROGRAM:  Access Code: [email or web address] URL: [email or web address] Date: 05/03/2022 Prepared by: [place]  Exercises - Standing Hip Abduction with Counter Support  - 1 x daily - 7 x weekly - 3 sets - 10 reps - Standing Hip Extension with Counter Support  - 1 x daily - 7 x weekly - 3 sets - 10 reps - Standing March with Counter Support  - 1 x daily - 7 x weekly - 3 sets - 10 reps - Heel Raises with Counter Support  - 1 x daily - 7 x weekly - 3 sets - 10 reps - Mini Squat with Counter Support  - 1 x daily - 7 x weekly - 3 sets - 10 reps - Seated Long Arc Quad  - 1 x daily - 7 x weekly - 3 sets - 10 reps    GOALS: Goals reviewed with patient? Yes  SHORT TERM GOALS: Target date: 09/05/2022  Pt will be independent with HEP in order to demonstrate increased ability to perform tasks related to occupation/hobbies. Baseline: Not given HEP at IE. Goal status: INITIAL   LONG TERM GOALS: Target date: 10/31/2022  1.  Patient (> 49 years old) will complete five times sit to stand test in < 15 seconds indicating an increased LE strength and improved balance. Baseline: 18.59 sec; 06/06/2022= 13.55 sec  07/18/22: 14.36 Goal status: GOAL MET  2.  Patient will increase FOTO score to equal to or greater than 53 to demonstrate statistically significant improvement in mobility and quality of life.  Baseline: 44; 06/06/2022= 51 07/18/22: 52/53 Goal status: PROGRESSING   3.  Patient will increase  Berg Balance score by > 6 points to demonstrate decreased fall risk during functional activities. Baseline: 46/56; 06/06/2022= 49/56 (however did not assess steps so likely higher and will need to complete next visit)  07/18/22: 54/56 Goal status: PROGRESSING   4.  Patient will reduce timed up and go to <11 seconds to reduce fall risk and demonstrate improved transfer/gait ability. Baseline: 18.42 06/06/2022= 17.39 sec without a device.  07/18/22: 14.65 sec Goal status: PROGRESSING   5.  Patient will increase 10 meter walk test to >1.80ms as to improve gait speed for better community ambulation and to reduce fall risk. Baseline: 0.68 m/s 07/18/22: 0.831m Goal status: PROGRESSING  6.  Patient will increase six minute walk test distance to >1000 for progression to community ambulator and improve gait ability Baseline: 565 ft, one standing rest break utilized at 5 min; 06/06/22= 615 feet 07/19/22:  512 ft one seated rest break at 5 min Goal status: PROGRESSING    ASSESSMENT:  CLINICAL IMPRESSION:  Pt performed well with final visit before having her heart procedure.  Pt is able to tolerate performing exercises well and noted to be worn out at the conclusion of the session.  Pt to have procedure on Thursday and will return to therapy once cleared by MD.   Pt will continue to benefit from skilled therapy to address remaining deficits in order to improve overall QoL and return to PLOF.      OBJECTIVE IMPAIRMENTS Abnormal gait, decreased activity tolerance, decreased balance, decreased mobility, difficulty walking, and decreased strength.   ACTIVITY LIMITATIONS lifting, bending, squatting, stairs, transfers, and reach over head  PARTICIPATION LIMITATIONS: cleaning, laundry, community activity, and yard work  [place], Past/current experiences, and Time since onset of injury/illness/exacerbation are also affecting patient's functional outcome.   REHAB POTENTIAL: Good  CLINICAL  DECISION MAKING: Stable/uncomplicated  EVALUATION COMPLEXITY:  Low   PLAN:  PT FREQUENCY: 2x/week  PT DURATION: 12 weeks  PLANNED INTERVENTIONS: Therapeutic exercises, Therapeutic activity, Neuromuscular re-education, Balance training, Gait training, Patient/Family education, Self Care, Joint mobilization, and Stair training  PLAN FOR NEXT SESSION:   Continue to improve LE strength and work on balance with task oriented items.    Gwenlyn Saran, PT, DPT Physical Therapist- [facility]  08/08/22, 1:51 PM

## 2022-08-10 ENCOUNTER — Ambulatory Visit: Payer: Medicare HMO

## 2022-08-10 DIAGNOSIS — I7122 Aneurysm of the aortic arch, without rupture: Secondary | ICD-10-CM | POA: Diagnosis not present

## 2022-08-10 DIAGNOSIS — I97710 Intraoperative cardiac arrest during cardiac surgery: Secondary | ICD-10-CM | POA: Diagnosis not present

## 2022-08-10 DIAGNOSIS — I959 Hypotension, unspecified: Secondary | ICD-10-CM | POA: Diagnosis not present

## 2022-08-10 DIAGNOSIS — I7123 Aneurysm of the descending thoracic aorta, without rupture: Secondary | ICD-10-CM | POA: Diagnosis not present

## 2022-08-10 DIAGNOSIS — I9751 Accidental puncture and laceration of a circulatory system organ or structure during a circulatory system procedure: Secondary | ICD-10-CM | POA: Diagnosis not present

## 2022-08-10 DIAGNOSIS — I772 Rupture of artery: Secondary | ICD-10-CM | POA: Diagnosis not present

## 2022-08-10 DIAGNOSIS — I5022 Chronic systolic (congestive) heart failure: Secondary | ICD-10-CM | POA: Diagnosis not present

## 2022-08-10 DIAGNOSIS — D696 Thrombocytopenia, unspecified: Secondary | ICD-10-CM | POA: Diagnosis not present

## 2022-08-10 DIAGNOSIS — I110 Hypertensive heart disease with heart failure: Secondary | ICD-10-CM | POA: Diagnosis not present

## 2022-08-10 DIAGNOSIS — I4901 Ventricular fibrillation: Secondary | ICD-10-CM | POA: Diagnosis not present

## 2022-08-10 DIAGNOSIS — I351 Nonrheumatic aortic (valve) insufficiency: Secondary | ICD-10-CM | POA: Diagnosis not present

## 2022-08-10 DIAGNOSIS — J439 Emphysema, unspecified: Secondary | ICD-10-CM | POA: Diagnosis not present

## 2022-08-10 DIAGNOSIS — Z953 Presence of xenogenic heart valve: Secondary | ICD-10-CM | POA: Diagnosis not present

## 2022-08-10 DIAGNOSIS — I714 Abdominal aortic aneurysm, without rupture, unspecified: Secondary | ICD-10-CM | POA: Diagnosis not present

## 2022-08-11 DIAGNOSIS — I7123 Aneurysm of the descending thoracic aorta, without rupture: Secondary | ICD-10-CM | POA: Diagnosis not present

## 2022-08-11 DIAGNOSIS — I351 Nonrheumatic aortic (valve) insufficiency: Secondary | ICD-10-CM | POA: Diagnosis not present

## 2022-08-15 ENCOUNTER — Ambulatory Visit: Payer: Medicare HMO

## 2022-08-16 ENCOUNTER — Ambulatory Visit: Payer: Self-pay | Admitting: *Deleted

## 2022-08-16 NOTE — Patient Outreach (Signed)
  Care Coordination   Follow Up Visit Note   08/16/2022 Name: April Rogers MRN: [id number] DOB: 10/07/1940  April Rogers is a 82 y.o. year old female who sees Adin Hector, MD for primary care. I spoke with daughter of April Rogers by phone today.  What matters to the patients health and wellness today?  Per daughter, patient passed away during surgery last week.     Goals Addressed             This Visit's Progress    COMPLETED: Endovascular aortic repair without complications   Not on track    Care Coordination Interventions: Evaluation of current treatment plan related to Aortic aneurysm and upcoming TAVR and patient's adherence to plan as established by provider Advised patient to continue monitoring blood pressure and heart rate, notify cardiology when outside stated parameters.  Goal is less than 130/80, daughter report at goal Provided education to patient re: potential plan of care post surgery, including cardiac rehab Reviewed medications with patient and discussed adherence and affordability Reviewed scheduled/upcoming provider appointments including cardiac surgery on 2/1 and PCP follow up on 3/27 Discussed plans with patient for ongoing care management follow up and provided patient with direct contact information for care management team Screening for signs and symptoms of depression related to chronic disease state  Assessed social determinant of health barriers Discussed support system in the home, lives alone but 2 daughters help with management of health and home.  Post surgery, she will discharge home with [name], then when patient is stronger, [name] will stay with patient until strong enough to live independently again.  Encouraged to continue outpatient PT sessions to continue to gain strength in preparation of upcoming surgery         SDOH assessments and interventions completed:  No     Care Coordination Interventions:  No, not indicated   Follow up  plan: No further intervention required.   Encounter Outcome:  Pt. Visit Completed   Valente David, RN, MSN, [place] Care Management Care Management Coordinator [phone]

## 2022-08-17 ENCOUNTER — Ambulatory Visit: Payer: Medicare HMO

## 2022-08-22 ENCOUNTER — Ambulatory Visit: Payer: Medicare HMO

## 2022-08-24 ENCOUNTER — Ambulatory Visit: Payer: Medicare HMO

## 2022-08-29 ENCOUNTER — Ambulatory Visit: Payer: Medicare HMO

## 2022-08-31 ENCOUNTER — Ambulatory Visit: Payer: Medicare HMO

## 2022-09-05 ENCOUNTER — Ambulatory Visit: Payer: Medicare HMO

## 2022-09-07 ENCOUNTER — Ambulatory Visit: Payer: Medicare HMO

## 2022-09-09 DEATH — deceased

## 2022-09-12 ENCOUNTER — Ambulatory Visit: Payer: Medicare HMO

## 2022-09-14 ENCOUNTER — Ambulatory Visit: Payer: Medicare HMO

## 2022-09-19 ENCOUNTER — Ambulatory Visit: Payer: Medicare HMO

## 2022-09-21 ENCOUNTER — Ambulatory Visit: Payer: Medicare HMO

## 2022-09-26 ENCOUNTER — Ambulatory Visit: Payer: Medicare HMO

## 2022-09-28 ENCOUNTER — Ambulatory Visit: Payer: Medicare HMO

## 2022-10-03 ENCOUNTER — Ambulatory Visit: Payer: Medicare HMO

## 2022-10-05 ENCOUNTER — Ambulatory Visit: Payer: Medicare HMO

## 2022-10-10 ENCOUNTER — Ambulatory Visit: Payer: Medicare HMO

## 2022-10-12 ENCOUNTER — Ambulatory Visit: Payer: Medicare HMO

## 2022-10-17 ENCOUNTER — Ambulatory Visit: Payer: Medicare HMO

## 2022-10-19 ENCOUNTER — Ambulatory Visit: Payer: Medicare HMO

## 2022-10-24 ENCOUNTER — Ambulatory Visit: Payer: Medicare HMO

## 2022-10-26 ENCOUNTER — Ambulatory Visit: Payer: Medicare HMO

## 2022-10-31 ENCOUNTER — Ambulatory Visit: Payer: Medicare HMO

## 2022-11-02 ENCOUNTER — Ambulatory Visit: Payer: Medicare HMO

## 2022-11-07 ENCOUNTER — Ambulatory Visit: Payer: Medicare HMO

## 2022-11-09 ENCOUNTER — Ambulatory Visit: Payer: Medicare HMO

## 2022-11-14 ENCOUNTER — Ambulatory Visit: Payer: Medicare HMO

## 2022-11-16 ENCOUNTER — Ambulatory Visit: Payer: Medicare HMO

## 2022-11-21 ENCOUNTER — Ambulatory Visit: Payer: Medicare HMO

## 2022-11-23 ENCOUNTER — Ambulatory Visit: Payer: Medicare HMO
